# Patient Record
Sex: Male | Born: 1960 | Race: Black or African American | Hispanic: No | Marital: Single | Smoking: Never smoker
Health system: Southern US, Community
[De-identification: ages and names within clinical notes are randomized; demographics above are authoritative.]

## PROBLEM LIST (undated history)

## (undated) DIAGNOSIS — I509 Heart failure, unspecified: Secondary | ICD-10-CM

## (undated) DIAGNOSIS — G8250 Quadriplegia, unspecified: Secondary | ICD-10-CM

## (undated) DIAGNOSIS — R1310 Dysphagia, unspecified: Secondary | ICD-10-CM

## (undated) DIAGNOSIS — I4891 Unspecified atrial fibrillation: Secondary | ICD-10-CM

## (undated) DIAGNOSIS — R569 Unspecified convulsions: Secondary | ICD-10-CM

## (undated) DIAGNOSIS — K219 Gastro-esophageal reflux disease without esophagitis: Secondary | ICD-10-CM

## (undated) DIAGNOSIS — E119 Type 2 diabetes mellitus without complications: Secondary | ICD-10-CM

## (undated) DIAGNOSIS — I5030 Unspecified diastolic (congestive) heart failure: Secondary | ICD-10-CM

## (undated) DIAGNOSIS — J9610 Chronic respiratory failure, unspecified whether with hypoxia or hypercapnia: Secondary | ICD-10-CM

---

## 2016-05-29 ENCOUNTER — Emergency Department (HOSPITAL_COMMUNITY): Payer: Medicare Other

## 2016-05-29 ENCOUNTER — Inpatient Hospital Stay (HOSPITAL_COMMUNITY): Payer: Medicare Other

## 2016-05-29 ENCOUNTER — Inpatient Hospital Stay (HOSPITAL_COMMUNITY)
Admission: EM | Admit: 2016-05-29 | Discharge: 2016-06-18 | DRG: 871 | Disposition: A | Discharge status: Skilled Nursing Facility | Payer: Medicare Other | Attending: Internal Medicine | Admitting: Internal Medicine

## 2016-05-29 ENCOUNTER — Encounter (HOSPITAL_COMMUNITY): Payer: Self-pay

## 2016-05-29 DIAGNOSIS — R627 Adult failure to thrive: Secondary | ICD-10-CM | POA: Diagnosis present

## 2016-05-29 DIAGNOSIS — Z87820 Personal history of traumatic brain injury: Secondary | ICD-10-CM

## 2016-05-29 DIAGNOSIS — L89622 Pressure ulcer of left heel, stage 2: Secondary | ICD-10-CM | POA: Diagnosis present

## 2016-05-29 DIAGNOSIS — F0781 Postconcussional syndrome: Secondary | ICD-10-CM | POA: Diagnosis not present

## 2016-05-29 DIAGNOSIS — E870 Hyperosmolality and hypernatremia: Secondary | ICD-10-CM | POA: Diagnosis not present

## 2016-05-29 DIAGNOSIS — R042 Hemoptysis: Secondary | ICD-10-CM

## 2016-05-29 DIAGNOSIS — Z515 Encounter for palliative care: Secondary | ICD-10-CM | POA: Diagnosis not present

## 2016-05-29 DIAGNOSIS — G9340 Encephalopathy, unspecified: Secondary | ICD-10-CM

## 2016-05-29 DIAGNOSIS — I5032 Chronic diastolic (congestive) heart failure: Secondary | ICD-10-CM | POA: Diagnosis not present

## 2016-05-29 DIAGNOSIS — L8990 Pressure ulcer of unspecified site, unspecified stage: Secondary | ICD-10-CM | POA: Diagnosis present

## 2016-05-29 DIAGNOSIS — E119 Type 2 diabetes mellitus without complications: Secondary | ICD-10-CM | POA: Diagnosis not present

## 2016-05-29 DIAGNOSIS — L89213 Pressure ulcer of right hip, stage 3: Secondary | ICD-10-CM | POA: Diagnosis not present

## 2016-05-29 DIAGNOSIS — L89159 Pressure ulcer of sacral region, unspecified stage: Secondary | ICD-10-CM

## 2016-05-29 DIAGNOSIS — E8809 Other disorders of plasma-protein metabolism, not elsewhere classified: Secondary | ICD-10-CM

## 2016-05-29 DIAGNOSIS — Z9911 Dependence on respirator [ventilator] status: Secondary | ICD-10-CM | POA: Diagnosis not present

## 2016-05-29 DIAGNOSIS — Z881 Allergy status to other antibiotic agents status: Secondary | ICD-10-CM

## 2016-05-29 DIAGNOSIS — J690 Pneumonitis due to inhalation of food and vomit: Secondary | ICD-10-CM | POA: Diagnosis present

## 2016-05-29 DIAGNOSIS — R58 Hemorrhage, not elsewhere classified: Secondary | ICD-10-CM

## 2016-05-29 DIAGNOSIS — R058 Other specified cough: Secondary | ICD-10-CM

## 2016-05-29 DIAGNOSIS — Y95 Nosocomial condition: Secondary | ICD-10-CM | POA: Diagnosis present

## 2016-05-29 DIAGNOSIS — Z5189 Encounter for other specified aftercare: Secondary | ICD-10-CM

## 2016-05-29 DIAGNOSIS — L8994 Pressure ulcer of unspecified site, stage 4: Secondary | ICD-10-CM | POA: Diagnosis present

## 2016-05-29 DIAGNOSIS — N390 Urinary tract infection, site not specified: Secondary | ICD-10-CM

## 2016-05-29 DIAGNOSIS — L89223 Pressure ulcer of left hip, stage 3: Secondary | ICD-10-CM | POA: Diagnosis not present

## 2016-05-29 DIAGNOSIS — B965 Pseudomonas (aeruginosa) (mallei) (pseudomallei) as the cause of diseases classified elsewhere: Secondary | ICD-10-CM | POA: Diagnosis present

## 2016-05-29 DIAGNOSIS — E875 Hyperkalemia: Secondary | ICD-10-CM | POA: Diagnosis present

## 2016-05-29 DIAGNOSIS — Z66 Do not resuscitate: Secondary | ICD-10-CM | POA: Diagnosis present

## 2016-05-29 DIAGNOSIS — R6521 Severe sepsis with septic shock: Secondary | ICD-10-CM | POA: Diagnosis not present

## 2016-05-29 DIAGNOSIS — R509 Fever, unspecified: Secondary | ICD-10-CM | POA: Diagnosis present

## 2016-05-29 DIAGNOSIS — Z6823 Body mass index (BMI) 23.0-23.9, adult: Secondary | ICD-10-CM

## 2016-05-29 DIAGNOSIS — I248 Other forms of acute ischemic heart disease: Secondary | ICD-10-CM | POA: Diagnosis not present

## 2016-05-29 DIAGNOSIS — R05 Cough: Secondary | ICD-10-CM

## 2016-05-29 DIAGNOSIS — G8250 Quadriplegia, unspecified: Secondary | ICD-10-CM | POA: Diagnosis not present

## 2016-05-29 DIAGNOSIS — R1310 Dysphagia, unspecified: Secondary | ICD-10-CM

## 2016-05-29 DIAGNOSIS — I4891 Unspecified atrial fibrillation: Secondary | ICD-10-CM | POA: Diagnosis present

## 2016-05-29 DIAGNOSIS — A419 Sepsis, unspecified organism: Principal | ICD-10-CM

## 2016-05-29 DIAGNOSIS — Y848 Other medical procedures as the cause of abnormal reaction of the patient, or of later complication, without mention of misadventure at the time of the procedure: Secondary | ICD-10-CM | POA: Diagnosis present

## 2016-05-29 DIAGNOSIS — D62 Acute posthemorrhagic anemia: Secondary | ICD-10-CM | POA: Diagnosis not present

## 2016-05-29 DIAGNOSIS — R0603 Acute respiratory distress: Secondary | ICD-10-CM

## 2016-05-29 DIAGNOSIS — K219 Gastro-esophageal reflux disease without esophagitis: Secondary | ICD-10-CM | POA: Diagnosis present

## 2016-05-29 DIAGNOSIS — L89154 Pressure ulcer of sacral region, stage 4: Secondary | ICD-10-CM | POA: Diagnosis present

## 2016-05-29 DIAGNOSIS — R609 Edema, unspecified: Secondary | ICD-10-CM

## 2016-05-29 DIAGNOSIS — E876 Hypokalemia: Secondary | ICD-10-CM

## 2016-05-29 DIAGNOSIS — D638 Anemia in other chronic diseases classified elsewhere: Secondary | ICD-10-CM | POA: Diagnosis present

## 2016-05-29 DIAGNOSIS — Z8619 Personal history of other infectious and parasitic diseases: Secondary | ICD-10-CM

## 2016-05-29 DIAGNOSIS — J95851 Ventilator associated pneumonia: Secondary | ICD-10-CM | POA: Diagnosis not present

## 2016-05-29 DIAGNOSIS — J189 Pneumonia, unspecified organism: Secondary | ICD-10-CM | POA: Diagnosis not present

## 2016-05-29 DIAGNOSIS — N319 Neuromuscular dysfunction of bladder, unspecified: Secondary | ICD-10-CM | POA: Diagnosis present

## 2016-05-29 DIAGNOSIS — E872 Acidosis: Secondary | ICD-10-CM | POA: Diagnosis present

## 2016-05-29 DIAGNOSIS — Z430 Encounter for attention to tracheostomy: Secondary | ICD-10-CM

## 2016-05-29 DIAGNOSIS — G40909 Epilepsy, unspecified, not intractable, without status epilepticus: Secondary | ICD-10-CM | POA: Diagnosis present

## 2016-05-29 DIAGNOSIS — E43 Unspecified severe protein-calorie malnutrition: Secondary | ICD-10-CM

## 2016-05-29 DIAGNOSIS — Z1613 Resistance to carbapenem: Secondary | ICD-10-CM

## 2016-05-29 DIAGNOSIS — L89612 Pressure ulcer of right heel, stage 2: Secondary | ICD-10-CM | POA: Diagnosis present

## 2016-05-29 DIAGNOSIS — R093 Abnormal sputum: Secondary | ICD-10-CM

## 2016-05-29 DIAGNOSIS — J9621 Acute and chronic respiratory failure with hypoxia: Secondary | ICD-10-CM

## 2016-05-29 DIAGNOSIS — Z931 Gastrostomy status: Secondary | ICD-10-CM

## 2016-05-29 DIAGNOSIS — I482 Chronic atrial fibrillation: Secondary | ICD-10-CM | POA: Diagnosis present

## 2016-05-29 DIAGNOSIS — Z452 Encounter for adjustment and management of vascular access device: Secondary | ICD-10-CM

## 2016-05-29 DIAGNOSIS — J9690 Respiratory failure, unspecified, unspecified whether with hypoxia or hypercapnia: Secondary | ICD-10-CM | POA: Diagnosis present

## 2016-05-29 DIAGNOSIS — R601 Generalized edema: Secondary | ICD-10-CM

## 2016-05-29 DIAGNOSIS — Z7189 Other specified counseling: Secondary | ICD-10-CM

## 2016-05-29 DIAGNOSIS — B964 Proteus (mirabilis) (morganii) as the cause of diseases classified elsewhere: Secondary | ICD-10-CM | POA: Diagnosis present

## 2016-05-29 DIAGNOSIS — Z1619 Resistance to other specified beta lactam antibiotics: Secondary | ICD-10-CM

## 2016-05-29 DIAGNOSIS — K9422 Gastrostomy infection: Secondary | ICD-10-CM

## 2016-05-29 DIAGNOSIS — Z7401 Bed confinement status: Secondary | ICD-10-CM

## 2016-05-29 DIAGNOSIS — A498 Other bacterial infections of unspecified site: Secondary | ICD-10-CM

## 2016-05-29 DIAGNOSIS — D6959 Other secondary thrombocytopenia: Secondary | ICD-10-CM | POA: Diagnosis not present

## 2016-05-29 HISTORY — DX: Dysphagia, unspecified: R13.10

## 2016-05-29 HISTORY — DX: Unspecified atrial fibrillation: I48.91

## 2016-05-29 HISTORY — DX: Quadriplegia, unspecified: G82.50

## 2016-05-29 HISTORY — DX: Type 2 diabetes mellitus without complications: E11.9

## 2016-05-29 HISTORY — DX: Unspecified diastolic (congestive) heart failure: I50.30

## 2016-05-29 HISTORY — DX: Gastro-esophageal reflux disease without esophagitis: K21.9

## 2016-05-29 HISTORY — DX: Chronic respiratory failure, unspecified whether with hypoxia or hypercapnia: J96.10

## 2016-05-29 LAB — CBC WITH DIFFERENTIAL/PLATELET
BASOS PCT: 0 %
Basophils Absolute: 0 10*3/uL (ref 0.0–0.1)
EOS ABS: 0 10*3/uL (ref 0.0–0.7)
EOS PCT: 0 %
HCT: 28.5 % — ABNORMAL LOW (ref 39.0–52.0)
Hemoglobin: 8.8 g/dL — ABNORMAL LOW (ref 13.0–17.0)
LYMPHS ABS: 1.2 10*3/uL (ref 0.7–4.0)
Lymphocytes Relative: 5 %
MCH: 31.3 pg (ref 26.0–34.0)
MCHC: 30.9 g/dL (ref 30.0–36.0)
MCV: 101.4 fL — ABNORMAL HIGH (ref 78.0–100.0)
MONO ABS: 2.1 10*3/uL — AB (ref 0.1–1.0)
MONOS PCT: 9 %
Neutro Abs: 19.8 10*3/uL — ABNORMAL HIGH (ref 1.7–7.7)
Neutrophils Relative %: 86 %
PLATELETS: 277 10*3/uL (ref 150–400)
RBC: 2.81 MIL/uL — AB (ref 4.22–5.81)
RDW: 17.5 % — AB (ref 11.5–15.5)
WBC: 23.1 10*3/uL — AB (ref 4.0–10.5)

## 2016-05-29 LAB — COMPREHENSIVE METABOLIC PANEL
ALBUMIN: 1.4 g/dL — AB (ref 3.5–5.0)
ALT: 62 U/L (ref 17–63)
AST: 75 U/L — AB (ref 15–41)
Alkaline Phosphatase: 214 U/L — ABNORMAL HIGH (ref 38–126)
Anion gap: 8 (ref 5–15)
BUN: 43 mg/dL — AB (ref 6–20)
CHLORIDE: 99 mmol/L — AB (ref 101–111)
CO2: 29 mmol/L (ref 22–32)
Calcium: 7.8 mg/dL — ABNORMAL LOW (ref 8.9–10.3)
Creatinine, Ser: 0.78 mg/dL (ref 0.61–1.24)
GFR calc Af Amer: >60 mL/min (ref >60)
GFR calc non Af Amer: >60 mL/min (ref >60)
GLUCOSE: 143 mg/dL — AB (ref 65–99)
POTASSIUM: 5.3 mmol/L — AB (ref 3.5–5.1)
SODIUM: 136 mmol/L (ref 135–145)
Total Bilirubin: 0.4 mg/dL (ref 0.3–1.2)
Total Protein: 5.8 g/dL — ABNORMAL LOW (ref 6.5–8.1)

## 2016-05-29 LAB — URINALYSIS, ROUTINE W REFLEX MICROSCOPIC
BILIRUBIN URINE: NEGATIVE
GLUCOSE, UA: NEGATIVE mg/dL
HGB URINE DIPSTICK: NEGATIVE
KETONES UR: NEGATIVE mg/dL
Nitrite: NEGATIVE
Protein, ur: NEGATIVE mg/dL
Specific Gravity, Urine: 1.022 (ref 1.005–1.030)
pH: 7 (ref 5.0–8.0)

## 2016-05-29 LAB — URINE MICROSCOPIC-ADD ON: SQUAMOUS EPITHELIAL / LPF: NONE SEEN

## 2016-05-29 LAB — I-STAT CG4 LACTIC ACID, ED
LACTIC ACID, VENOUS: 3.91 mmol/L — AB (ref 0.5–1.9)
Lactic Acid, Venous: 2.72 mmol/L (ref 0.5–1.9)

## 2016-05-29 LAB — CORTISOL: CORTISOL PLASMA: 16.1 ug/dL

## 2016-05-29 LAB — PROCALCITONIN: Procalcitonin: 10.07 ng/mL

## 2016-05-29 LAB — TROPONIN I
TROPONIN I: 0.07 ng/mL — AB (ref <0.03)
Troponin I: 0.12 ng/mL (ref <0.03)

## 2016-05-29 LAB — APTT: APTT: 37 s — AB (ref 24–36)

## 2016-05-29 LAB — PROTIME-INR
INR: 1.84
Prothrombin Time: 21.5 seconds — ABNORMAL HIGH (ref 11.4–15.2)

## 2016-05-29 LAB — MAGNESIUM: MAGNESIUM: 1.9 mg/dL (ref 1.7–2.4)

## 2016-05-29 LAB — ABO/RH: ABO/RH(D): A POS

## 2016-05-29 LAB — CBG MONITORING, ED: GLUCOSE-CAPILLARY: 138 mg/dL — AB (ref 65–99)

## 2016-05-29 MED ORDER — HYDROCORTISONE NA SUCCINATE PF 100 MG IJ SOLR
50.0000 mg | Freq: Four times a day (QID) | INTRAMUSCULAR | Status: DC
  Administered 2016-05-29 – 2016-05-31 (×8): 50 mg via INTRAVENOUS
  Filled 2016-05-29 (×8): qty 2

## 2016-05-29 MED ORDER — DEXTROSE 5 % IV SOLN
1.0000 g | Freq: Three times a day (TID) | INTRAVENOUS | Status: DC
  Filled 2016-05-29 (×2): qty 1

## 2016-05-29 MED ORDER — SODIUM CHLORIDE 0.9 % IV SOLN
INTRAVENOUS | Status: DC
  Administered 2016-05-30: 75 mL/h via INTRAVENOUS
  Administered 2016-06-06: 250 mL via INTRAVENOUS
  Administered 2016-06-08 – 2016-06-13 (×2): via INTRAVENOUS

## 2016-05-29 MED ORDER — LINEZOLID 600 MG/300ML IV SOLN
600.0000 mg | Freq: Two times a day (BID) | INTRAVENOUS | Status: DC
  Administered 2016-05-29 – 2016-06-04 (×13): 600 mg via INTRAVENOUS
  Filled 2016-05-29 (×17): qty 300

## 2016-05-29 MED ORDER — ASPIRIN 300 MG RE SUPP
150.0000 mg | Freq: Every day | RECTAL | Status: DC
  Administered 2016-05-29 – 2016-05-30 (×2): 150 mg via RECTAL
  Filled 2016-05-29 (×3): qty 1

## 2016-05-29 MED ORDER — ORAL CARE MOUTH RINSE
15.0000 mL | Freq: Four times a day (QID) | OROMUCOSAL | Status: DC
  Administered 2016-05-30 – 2016-06-18 (×74): 15 mL via OROMUCOSAL

## 2016-05-29 MED ORDER — SODIUM CHLORIDE 0.9 % IV SOLN
500.0000 mg | Freq: Four times a day (QID) | INTRAVENOUS | Status: DC
  Administered 2016-05-29: 500 mg via INTRAVENOUS
  Filled 2016-05-29 (×3): qty 500

## 2016-05-29 MED ORDER — SODIUM CHLORIDE 0.9 % IV BOLUS (SEPSIS)
1000.0000 mL | Freq: Once | INTRAVENOUS | Status: AC
  Administered 2016-05-29: 1000 mL via INTRAVENOUS

## 2016-05-29 MED ORDER — NOREPINEPHRINE BITARTRATE 1 MG/ML IV SOLN
5.0000 ug/min | INTRAVENOUS | Status: DC
  Filled 2016-05-29: qty 4

## 2016-05-29 MED ORDER — SODIUM CHLORIDE 0.9 % IV BOLUS (SEPSIS)
1000.0000 mL | Freq: Once | INTRAVENOUS | Status: AC
Start: 2016-05-29 — End: 2016-05-29
  Administered 2016-05-29: 1000 mL via INTRAVENOUS

## 2016-05-29 MED ORDER — HEPARIN SODIUM (PORCINE) 5000 UNIT/ML IJ SOLN
5000.0000 [IU] | Freq: Three times a day (TID) | INTRAMUSCULAR | Status: DC
  Administered 2016-05-29 – 2016-06-12 (×39): 5000 [IU] via SUBCUTANEOUS
  Filled 2016-05-29 (×39): qty 1

## 2016-05-29 MED ORDER — DEXTROSE 5 % IV SOLN
2.0000 g | Freq: Once | INTRAVENOUS | Status: AC
  Administered 2016-05-29: 2 g via INTRAVENOUS
  Filled 2016-05-29: qty 2

## 2016-05-29 MED ORDER — SODIUM CHLORIDE 0.9 % IV SOLN: 500.0000 mL | INTRAVENOUS | Status: DC | PRN

## 2016-05-29 MED ORDER — LEVOFLOXACIN IN D5W 750 MG/150ML IV SOLN: 750.0000 mg | INTRAVENOUS | Status: DC

## 2016-05-29 MED ORDER — SODIUM CHLORIDE 0.9 % IV SOLN
INTRAVENOUS | Status: DC
  Administered 2016-05-29: 14:00:00 via INTRAVENOUS
  Administered 2016-05-30 – 2016-05-31 (×2): 75 mL/h via INTRAVENOUS

## 2016-05-29 MED ORDER — INSULIN ASPART 100 UNIT/ML ~~LOC~~ SOLN
0.0000 [IU] | SUBCUTANEOUS | Status: DC
  Administered 2016-05-31 (×2): 2 [IU] via SUBCUTANEOUS
  Administered 2016-06-01: 1 [IU] via SUBCUTANEOUS
  Administered 2016-06-01: 2 [IU] via SUBCUTANEOUS
  Administered 2016-06-01: 1 [IU] via SUBCUTANEOUS
  Administered 2016-06-01: 2 [IU] via SUBCUTANEOUS
  Administered 2016-06-02 – 2016-06-12 (×10): 1 [IU] via SUBCUTANEOUS

## 2016-05-29 MED ORDER — LEVOFLOXACIN IN D5W 750 MG/150ML IV SOLN
750.0000 mg | Freq: Once | INTRAVENOUS | Status: AC
Start: 2016-05-29 — End: 2016-05-29
  Administered 2016-05-29: 750 mg via INTRAVENOUS
  Filled 2016-05-29: qty 150

## 2016-05-29 MED ORDER — SODIUM CHLORIDE 0.9 % IV SOLN
500.0000 mg | Freq: Three times a day (TID) | INTRAVENOUS | Status: DC
  Administered 2016-05-30 (×2): 500 mg via INTRAVENOUS
  Filled 2016-05-29 (×4): qty 500

## 2016-05-29 MED ORDER — CHLORHEXIDINE GLUCONATE 0.12% ORAL RINSE (MEDLINE KIT)
15.0000 mL | Freq: Two times a day (BID) | OROMUCOSAL | Status: DC
  Administered 2016-05-30 – 2016-06-18 (×40): 15 mL via OROMUCOSAL

## 2016-05-29 MED ORDER — FAMOTIDINE IN NACL 20-0.9 MG/50ML-% IV SOLN
20.0000 mg | Freq: Two times a day (BID) | INTRAVENOUS | Status: DC
  Administered 2016-05-29 – 2016-06-01 (×7): 20 mg via INTRAVENOUS
  Filled 2016-05-29 (×8): qty 50

## 2016-05-29 MED ORDER — SODIUM CHLORIDE 0.9 % IV BOLUS (SEPSIS)
250.0000 mL | Freq: Once | INTRAVENOUS | Status: AC
  Administered 2016-05-29: 250 mL via INTRAVENOUS

## 2016-05-29 MED ORDER — SODIUM CHLORIDE 0.9% FLUSH
10.0000 mL | INTRAVENOUS | Status: DC | PRN
  Administered 2016-05-30 – 2016-06-15 (×10): 10 mL
  Filled 2016-05-29 (×10): qty 40

## 2016-05-29 NOTE — ED Notes (Signed)
Pt family members requesting to speak w/ Renae Fickle, NP regarding unsuccessful attempt at placing central line. Contacted e-link MD who will notify Renae Fickle, NP.

## 2016-05-29 NOTE — Procedures (Signed)
Central Venous Catheter Insertion Procedure Note Dean Graham [id number] [birth year]  Procedure: Insertion of Central Venous Catheter Indications: Drug and/or fluid administration  Procedure Details Consent: Risks of procedure as well as the alternatives and risks of each were explained to the (patient/caregiver).  Consent for procedure obtained. Time Out: Verified patient identification, verified procedure, site/side was marked, verified correct patient position, special equipment/implants available, medications/allergies/relevent history reviewed, required imaging and test results available.  Performed  Maximum sterile technique was used including antiseptics, cap, gloves, gown, hand hygiene, mask and sheet. Skin prep: Chlorhexidine; local anesthetic administered Left IJ vein accessed under ultrasound guidance. Guidewire placed, however, unable to advance guidewire further than 5-6 cm before meeting significant resistance. Attempted to reposition and manipulate wire without success. Able to visualize guidwire in vessel, however, it met some obstruction. Procedure aborted. CXR to follow.   Georgann Housekeeper, AGACNP-BC [place] [facility] Pager [phone] or [phone]  05/29/2016 6:08 PM   Required Completed most of the procedure, wire etc  Lavon Paganini. Titus Mould, MD, [place] Pgr: [facility]

## 2016-05-29 NOTE — ED Notes (Signed)
Pt suctioned via tracheostomy at this time.  Pt noted to be coughing profusely at this time.

## 2016-05-29 NOTE — ED Notes (Signed)
IV team at bedside 

## 2016-05-29 NOTE — ED Notes (Signed)
Paged [facility] to [employer], Charity fundraiser

## 2016-05-29 NOTE — ED Notes (Signed)
Family at bedside, updated on pt status. Pt now more alert, opening eyes and nodding yes or no when asked questions by family.

## 2016-05-29 NOTE — Progress Notes (Addendum)
Pharmacy Antibiotic Note  Dean Graham is a 55 y.o. male admitted on 9/19/2017with AMS and sepsis. Pharmacy has been consulted for imipenem - cilastatin dosing. Temp 100.7, WBC 23.1, lactate 2.72, and PCT 10.07.   Plan: Impenem-cilastatin 500 mg IV q8hr Linezolid 600 mg q12hr - physician dosed  Physician D/C'd aztreonam and levaquin Monitor renal function, clinical picture, and culture results.   Height: 5\' 10"  (177.8 cm) Weight: 100 lb (45.4 kg) IBW/kg (Calculated) : 73  Temp (24hrs), Avg:100.7 F (38.2 C), Min:100.7 F (38.2 C), Max:100.7 F (38.2 C)   Recent Labs Lab 05/29/16 1051 05/29/16 1349 05/29/16 1643  WBC  --   --  23.1*  CREATININE  --   --  0.78  LATICACIDVEN 3.91* 2.72*  --     Estimated Creatinine Clearance: 65.4 mL/min (by C-G formula based on SCr of 0.78 mg/dL).    Allergies  Allergen Reactions  . Ativan [Lorazepam]   . Azithromycin   . Ceftriaxone   . Vancomycin     Antimicrobials this admission: 9/19 Imipenem-cilastatin >>  9/19 Linezolid >>  9/19 Aztreonam >> 9/19 9/19 Levaquin >> 9/19  Dose adjustments this admission: N/A   Microbiology results: pending   Thank you for allowing pharmacy to be a part of this patient's care.  York Cerise, PharmD Pharmacy Resident  Pager [phone] 05/29/16 7:01 PM

## 2016-05-29 NOTE — Progress Notes (Signed)
Pharmacy Antibiotic Note  Dean Graham is a 55 y.o. male admitted on 05/29/2016 with sepsis.  Pharmacy has been consulted for levaquin and aztreonam dosing. Tmax 100.7, WBC is elevated at 23.1 and SCr is 0.78. Pt is a quad so renal function is difficult to estimate.   Plan: - Levaquin 750mg  IV Q24H - Aztreonam 2gm IV x 1 then 1gm IV Q8H - F/u renal fxn, C&S, clinical status   Height: 5\' 10"  (177.8 cm) Weight: 100 lb (45.4 kg) IBW/kg (Calculated) : 73  Temp (24hrs), Avg:100.7 F (38.2 C), Min:100.7 F (38.2 C), Max:100.7 F (38.2 C)  No results for input(s): WBC, CREATININE, LATICACIDVEN, VANCOTROUGH, VANCOPEAK, VANCORANDOM, GENTTROUGH, GENTPEAK, GENTRANDOM, TOBRATROUGH, TOBRAPEAK, TOBRARND, AMIKACINPEAK, AMIKACINTROU, AMIKACIN in the last 168 hours.  CrCl cannot be calculated (No order found.).    Allergies  Allergen Reactions  . Ativan [Lorazepam]   . Azithromycin   . Ceftriaxone   . Vancomycin     Antimicrobials this admission: Aztreo 9/19>> Levaquin 9/19>>  Dose adjustments this admission: N/A  Microbiology results: Pending  Thank you for allowing pharmacy to be a part of this patient's care.  Orton Capell, Drake Leach 05/29/2016 12:12 PM

## 2016-05-29 NOTE — ED Triage Notes (Signed)
Per [facility] - pt from [facility]. Quadriplegic w/ trach. Called out for altered mental status and hypotension. Baseline communication for pt is mouthing words.

## 2016-05-29 NOTE — ED Notes (Signed)
Report called to [place], RN at this time.  Receiving nurse denies having any further questions at this time.

## 2016-05-29 NOTE — Progress Notes (Signed)
eLink Physician-Brief Progress Note Patient Name: Dean Graham DOB: 1961/08/28 MRN: [id number]   Date of Service  05/29/2016  HPI/Events of Note  Troponin #1 = 0.12. EKG reveals non specific ST-T changes and 2. Will need CVL.  eICU Interventions  Will order: 1. ASA 150 mg PR now.  2. Continue to cycle troponin. 3. Will inform ground team of need for CVL.     Intervention Category Intermediate Interventions: Diagnostic test evaluation  Lenell Antu 05/29/2016, 3:40 PM

## 2016-05-29 NOTE — ED Notes (Signed)
CBG 138

## 2016-05-29 NOTE — Progress Notes (Addendum)
Patient from [facility], confirmed with [facility] in admissions. Patient w hx quadriplegia, trach, vent dependent. Dispo back to [facility] once medically stable and cleared for discharge.          Lance Muss, LCSW [facility] ED/41M Clinical Social Worker [phone]

## 2016-05-29 NOTE — ED Notes (Signed)
Notified by IV team at this time that they will not be able to place a PICC line on pt tonight.  Per IV team pt is too hypotensive to have a PICC line placed.  Will notify Md.

## 2016-05-29 NOTE — H&P (Signed)
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 He has ventilator depended after his initial TBI for few years then he did not require vent support until recently 2-3 years ago when he needed to be intubated and trach depended since. He was wheelchair bound in the past but has been bed ridden for last few years. Has been in a contracted position for few years. He does talk and interact at base line but has been less interactive today per brother. In ED he was febrile to 100.7, hupotensive 72/60. Was given IVF ~4L without improvement of BP. However, his MAP is >65.   PAST MEDICAL HISTORY :  He  has a past medical history of Atrial fibrillation ([place]); Chronic respiratory failure ([place]); Diabetes mellitus without complication ([place]); Diastolic heart failure ([place]); Dysphagia; GERD (gastroesophageal reflux disease); and Quadriplegia ([place]).  PAST SURGICAL HISTORY: He  has no past surgical history on file.  Allergies  Allergen Reactions  . Ativan [Lorazepam]   . Azithromycin   . Ceftriaxone   . Vancomycin     No current facility-administered medications on file prior to encounter.    No current outpatient prescriptions on file prior to encounter.    FAMILY HISTORY:  His has no family status information on file.    SOCIAL HISTORY: He  has been fully depended for his ADL and IADL. No hx of alcohol , tobacco or drug use.   REVIEW OF SYSTEMS:   Denies any chest pain or SOB. Limited ROS due to patient's mental  status.   SUBJECTIVE:  Has trach, vent depended. Denies any chest pain or SOB per patient however he is sedated.   VITAL SIGNS: BP (!) 86/67   Pulse 110   Temp (S) 100.7 F (38.2 C) (Rectal)   Resp 20   Ht _0  (1.778 m)   Wt 100 lb (45.4 kg)   SpO2 100%   BMI 14.35 kg/m   HEMODYNAMICS:    VENTILATOR SETTINGS: Vent Mode: PRVC FiO2 (%):  [50 %] 50 % Set Rate:  [14 bmp] 14 bmp Vt Set:  [460 mL-4660 mL] 4660 mL PEEP:  [5 cmH20] 5 cmH20 Plateau Pressure:  [22 cmH20-24 cmH20] 22 cmH20  INTAKE / OUTPUT: No intake/output data recorded.  PHYSICAL EXAMINATION: General:  Has trach collar, wakes up to nod and answer some questions Neuro:  Lethargic, but wakes up to answer some questions by nodding HEENT:  PERRL,  Cardiovascular:  Tachycardic,reg rhythm  Lungs:  Coarse breathe sounds.  Abdomen:   Musculoskeletal:  All exts are contracted.  Skin:  Open ulcer on left hip 4x6 in size. No rash  LABS:  None yet.  BMET No results for input(s): NA, K, CL, CO2, BUN, CREATININE, GLUCOSE in the last 168 hours.  Electrolytes No results for input(s): CALCIUM, MG, PHOS in the last 168 hours.  CBC No results for input(s): WBC, HGB, HCT, PLT in the last 168 hours.  Coag's No results for input(s): APTT, INR in the last 168 hours.  Sepsis Markers No results for  input(s): LATICACIDVEN, PROCALCITON, O2SATVEN in the last 168 hours.  ABG No results for input(s): PHART, PCO2ART, PO2ART in the last 168 hours.  Liver Enzymes No results for input(s): AST, ALT, ALKPHOS, BILITOT, ALBUMIN in the last 168 hours.  Cardiac Enzymes No results for input(s): TROPONINI, PROBNP in the last 168 hours.  Glucose No results for input(s): GLUCAP in the last 168 hours.  Imaging Dg Chest Port 1 View  Result Date: 05/29/2016 CLINICAL DATA:  Fever. EXAM: PORTABLE CHEST 1 VIEW COMPARISON:  None. FINDINGS: Extensive hazy opacification of the bilateral chest with streaky interstitial densities.  Stippled calcific type density over the right chest which could be parenchymal or artifactual. Sacrificed or at least discontinuous shunt catheter over the right neck. Tracheostomy tube is well seated. Normal heart size. Negative mediastinal contours for rotation. Osteopenia in this patient with quadriplegia. 1 cm nodular density over the left base. IMPRESSION: 1. Bilateral pleural effusion with pneumonia or atelectasis. 2. Artifact or nonspecific calcification over the right chest. 3. 1 cm nodule over the left base, likely nipple shadow. Attention on follow-up. Electronically Signed   By: Monte Fantasia M.D.   On: 05/29/2016 10:55     STUDIES:    CULTURES: [place] 9/19 >> Ucx 9/19 >>  ANTIBIOTICS: Allergic to azithromycin, ceftx, vanc, and ativan Aztreonam 9/19 >>9/19 Linezolid 9/19 >> levaquin 9/19 >>    SIGNIFICANT EVENTS: Admitted with severe sepsis with AMS.  LINES/TUBES: PEG tube Trach  DISCUSSION: Discussed with brother at bed side and met him DNR   ASSESSMENT / PLAN:  PULMONARY A: Acute on chronic vent depended resp failure - on trach for years HCAP with ?bilateral infiltrate  P:   Wean on vent settings as able, currently on FIO2 50%, PEEP 5.  CARDIOVASCULAR A:  Chronic Afib on amio Sinus tach now hypotensive P:  Continue fluid resusc Will likely need pressors for refractory hypotension Continue amio for afib  RENAL A:   crt stable from [facility] labs, repeat pending Lactic acidosis from severe sepsis P:   Cont fluids Check BMET, trend bmet Follow lactic acid  GASTROINTESTINAL A:   Not having diarrhea, unclear why on enteric  PEG tube depended Chronic neurogenic bladder P:   Cont tube feeds Cont pepcid Cont foley  HEMATOLOGIC A:   Chronic anemia of chronic disease hgb stable at baseline ~8 leukocytosis P:  No tx needed now. tx <7.0 Cont trend wbc count with abx treatment  INFECTIOUS A:   Severe sepsis from HCAP P:   Cont abx (levaquin  + linezolid as above)  ENDOCRINE  Check cortisol   NEUROLOGIC A:   AMS from infection Chronic contracture  P: Monitor for improvement with abx therapy     RASS goal: 0 to 1.   FAMILY  - Updates: updated family at bed side.   - Inter-disciplinary family meet or Palliative Care meeting due by: 06/05/16.   Pulmonary and [place] Pager: [phone]  05/29/2016, 2:10 PM  STAFF NOTE: I, Merrie Roof, MD FACP have personally reviewed patient's available data, including medical history, events of note, physical examination and test results as part of my evaluation. I have discussed with resident/NP and other care providers such as pharmacist, RN and RRT. In addition, I personally evaluated patient and elicited key findings of: aweful circumstance at baseline, contracted severe, ronchi moderate, trach site secretions, sepsis noted r/o PNA as source, given h/o resistance would favor Imipenem / linazolid, was called a code sepsis - agree, ensure  repeat lactic - improved with volume and repeat assessment has been performed by me at 1pm today, fluid 30 cc/kg, may require pressors levophed and place line, INR noted, pcxr hazziness, may need [place] chest assessment for effusion, I udpated the bother and we discussed his unfair life since age 75, we confirmed DNR, no ACLS, he would want pressors and line if needed to support him through this, to me heroics would be medically ineffective and futile and allow him to suffer The patient is critically ill with multiple organ systems failure and requires high complexity decision making for assessment and support, frequent evaluation and titration of therapies, application of advanced monitoring technologies and extensive interpretation of multiple databases.   Critical Care Time devoted to patient care services described in this note is 40 Minutes. This time reflects time of care of this signee: Merrie Roof, MD  FACP. This critical care time does not reflect procedure time, or teaching time or supervisory time of PA/NP/Med student/Med Resident etc but could involve care discussion time. Rest per NP/medical resident whose note is outlined above and that I agree with   Lavon Paganini. Titus Mould, MD, [place] Pgr: [place] [facility] 05/29/2016 5:03 PM

## 2016-05-29 NOTE — ED Provider Notes (Addendum)
[facility]-EMERGENCY DEPT Provider Note   CSN: [id number] Arrival date & time: 05/29/16  1009     History   Chief Complaint Chief Complaint  Patient presents with  . Hypotension  . Altered Mental Status    HPI Dean Graham is a 55 y.o. male.  Patient current resident at [facility] in acute care area, was noted to have low blood pressure today.  Patient w hx quadriplegia, trach, vent dependent.  Hx dm, and afib.   Patient is currently not verbally responsive to questions - level 5 caveat.  Patient reported baseline is that he will mouth words. No reported fevers.    The history is provided by the patient and the EMS personnel. The history is limited by the condition of the patient.  Altered Mental Status      Past Medical History:  Diagnosis Date  . Atrial fibrillation (HCC)   . Chronic respiratory failure (HCC)   . Diabetes mellitus without complication (HCC)   . Diastolic heart failure (HCC)   . Dysphagia   . GERD (gastroesophageal reflux disease)   . Quadriplegia (HCC)     There are no active problems to display for this patient.   No past surgical history on file.     Home Medications    Prior to Admission medications   Not on File    Family History No family history on file.  Social History Social History  Substance Use Topics  . Smoking status: Not on file  . Smokeless tobacco: Not on file  . Alcohol use Not on file     Allergies   Ativan [lorazepam]; Azithromycin; Ceftriaxone; and Vancomycin   Review of Systems Review of Systems  Unable to perform ROS: Patient nonverbal  level 5 caveat - pt not responsive   Physical Exam Updated Vital Signs BP (!) 86/66   Pulse (!) 127   Temp (S) 100.7 F (38.2 C) (Rectal)   Resp 20   SpO2 100%   Physical Exam  Constitutional:  Thin, trach, chronically ill appearing.   HENT:  Head: Atraumatic.  Mouth/Throat: Oropharynx is clear and moist.  Eyes: Pupils are equal, round, and reactive to light.  No scleral icterus.  Neck: Neck supple. No tracheal deviation present.  No stiffness or rigidity.  Trach site w moderate drainage.   Cardiovascular: Regular rhythm, normal heart sounds and intact distal pulses.   No murmur heard. Tachycardic.   Pulmonary/Chest: Effort normal. No accessory muscle usage.  Rhonchi bil.   Abdominal: Soft. Bowel sounds are normal. He exhibits no distension. There is no tenderness.  Genitourinary:  Genitourinary Comments: No cva tenderness  Musculoskeletal: He exhibits no edema.  Open ulcer left hip, approx 4 by 6 cm, no necrotic tissue. Heel decubitus ulcers.   Neurological:  Awake. Opens eyes spontaneously and to voice. [name], does not attempt to speak, or otherwise communicate. bil contractures.   Skin: Skin is warm and dry. No rash noted. He is not diaphoretic.  Psychiatric:  Lethargic.   Nursing note and vitals reviewed.    ED Treatments / Results  Labs (all labs ordered are listed, but only abnormal results are displayed) Labs Reviewed  CULTURE, BLOOD (ROUTINE X 2)  CULTURE, BLOOD (ROUTINE X 2)  URINE CULTURE  COMPREHENSIVE METABOLIC PANEL  CBC WITH DIFFERENTIAL/PLATELET  URINALYSIS, ROUTINE W REFLEX MICROSCOPIC (NOT [facility])  I-STAT CG4 LACTIC ACID, ED    EKG  EKG Interpretation  Date/Time:  Tuesday May 29 2016 10:17:24 EDT Ventricular Rate:  124 PR  Interval:    QRS Duration: 88 QT Interval:  331 QTC Calculation: 476 R Axis:   -91 Text Interpretation:  Sinus tachycardia Left anterior fascicular block Consider left ventricular hypertrophy Non-specific ST-t changes No previous tracing Confirmed by Denton Lank  MD, Caryn Bee ([id number]) on 05/29/2016 10:20:43 AM       Radiology Dg Chest Port 1 View  Result Date: 05/29/2016 CLINICAL DATA:  Fever. EXAM: PORTABLE CHEST 1 VIEW COMPARISON:  None. FINDINGS: Extensive hazy opacification of the bilateral chest with streaky interstitial densities. Stippled calcific type density over the right chest  which could be parenchymal or artifactual. Sacrificed or at least discontinuous shunt catheter over the right neck. Tracheostomy tube is well seated. Normal heart size. Negative mediastinal contours for rotation. Osteopenia in this patient with quadriplegia. 1 cm nodular density over the left base. IMPRESSION: 1. Bilateral pleural effusion with pneumonia or atelectasis. 2. Artifact or nonspecific calcification over the right chest. 3. 1 cm nodule over the left base, likely nipple shadow. Attention on follow-up. Electronically Signed   By: Marnee Spring M.D.   On: 05/29/2016 10:55    Procedures Procedures (including critical care time)  Medications Ordered in ED Medications  sodium chloride 0.9 % bolus 1,000 mL (not administered)    And  sodium chloride 0.9 % bolus 1,000 mL (not administered)    And  sodium chloride 0.9 % bolus 250 mL (not administered)  levofloxacin (LEVAQUIN) IVPB 750 mg (not administered)  aztreonam (AZACTAM) 2 g in dextrose 5 % 50 mL IVPB (not administered)     Initial Impression / Assessment and Plan / ED Course  I have reviewed the triage vital signs and the nursing notes.  Pertinent labs & imaging results that were available during my care of the patient were reviewed by me and considered in my medical decision making (see chart for details).  Clinical Course   Iv normal saline bolus. Continuous pulse ox and monitor. Respiratory therapy consulted, trach care, cleaning, suctioning, vent.   Labs. Pcxr. Cultures.   Iv abx.   Initial report was that possibly DNR - however on review [facility] records it appears pt currently is full code.  No family currently available.   Reviewed nursing notes and prior charts for additional history.   Labs/lab interface is down, so results wont pull to chart.  On checking with lab, they report lactate level 3.9, WBC 23 with left shift.   Additional ns bolus, 30 cc/kg.  PCCM consulted as bp remains low, septic shock admission  - discussed pt with Dr Tyson Alias - they will see/admit.  [place] review ED visits Nov and Dec 2016 - pts baseline bp appears in the 110-115 range.   CRITICAL CARE  RE: septic shock, hcap, persistent hypotension Performed by: Suzi Roots Total critical care time: 40 minutes Critical care time was exclusive of separately billable procedures and treating other patients. Critical care was necessary to treat or prevent imminent or life-threatening deterioration. Critical care was time spent personally by me on the following activities: development of treatment plan with patient and/or surrogate as well as nursing, discussions with consultants, evaluation of patient's response to treatment, examination of patient, obtaining history from patient or surrogate, ordering and performing treatments and interventions, ordering and review of laboratory studies, ordering and review of radiographic studies, pulse oximetry and re-evaluation of patient's condition.  Sepsis reassessment completed.   Patients bp mildly improved 88/50.   PCCM in ED to admit.     Final Clinical Impressions(s) / ED Diagnoses  Final diagnoses:  None    New Prescriptions New Prescriptions   No medications on file         Cathren Laine, MD 05/29/16 1236

## 2016-05-29 NOTE — ED Notes (Signed)
Got shiley number #7 cuffed from OR

## 2016-05-30 DIAGNOSIS — J9601 Acute respiratory failure with hypoxia: Secondary | ICD-10-CM | POA: Diagnosis not present

## 2016-05-30 DIAGNOSIS — R509 Fever, unspecified: Secondary | ICD-10-CM | POA: Diagnosis not present

## 2016-05-30 DIAGNOSIS — A419 Sepsis, unspecified organism: Secondary | ICD-10-CM | POA: Diagnosis not present

## 2016-05-30 DIAGNOSIS — N179 Acute kidney failure, unspecified: Secondary | ICD-10-CM | POA: Diagnosis not present

## 2016-05-30 DIAGNOSIS — J189 Pneumonia, unspecified organism: Secondary | ICD-10-CM | POA: Diagnosis not present

## 2016-05-30 LAB — BASIC METABOLIC PANEL
Anion gap: 5 (ref 5–15)
BUN: 38 mg/dL — ABNORMAL HIGH (ref 6–20)
CALCIUM: 7.4 mg/dL — AB (ref 8.9–10.3)
CHLORIDE: 108 mmol/L (ref 101–111)
CO2: 25 mmol/L (ref 22–32)
CREATININE: 0.65 mg/dL (ref 0.61–1.24)
Glucose, Bld: 85 mg/dL (ref 65–99)
Potassium: 5.5 mmol/L — ABNORMAL HIGH (ref 3.5–5.1)
SODIUM: 138 mmol/L (ref 135–145)

## 2016-05-30 LAB — GLUCOSE, CAPILLARY
GLUCOSE-CAPILLARY: 88 mg/dL (ref 65–99)
GLUCOSE-CAPILLARY: 85 mg/dL (ref 65–99)
GLUCOSE-CAPILLARY: 80 mg/dL (ref 65–99)
Glucose-Capillary: 90 mg/dL (ref 65–99)
Glucose-Capillary: 77 mg/dL (ref 65–99)

## 2016-05-30 LAB — URINE CULTURE

## 2016-05-30 LAB — CBC
HCT: 22.7 % — ABNORMAL LOW (ref 39.0–52.0)
HEMOGLOBIN: 7 g/dL — AB (ref 13.0–17.0)
MCH: 31.3 pg (ref 26.0–34.0)
MCHC: 30.8 g/dL (ref 30.0–36.0)
MCV: 101.3 fL — ABNORMAL HIGH (ref 78.0–100.0)
PLATELETS: 247 10*3/uL (ref 150–400)
RBC: 2.24 MIL/uL — ABNORMAL LOW (ref 4.22–5.81)
RDW: 17.4 % — AB (ref 11.5–15.5)
WBC: 11.4 10*3/uL — ABNORMAL HIGH (ref 4.0–10.5)

## 2016-05-30 LAB — POCT I-STAT 3, ART BLOOD GAS (G3+)
ACID-BASE EXCESS: 5 mmol/L — AB (ref 0.0–2.0)
BICARBONATE: 28.8 mmol/L — AB (ref 20.0–28.0)
O2 SAT: 100 %
PCO2 ART: 42.3 mmHg (ref 32.0–48.0)
PO2 ART: 214 mmHg — AB (ref 83.0–108.0)
Patient temperature: 100.1
TCO2: 30 mmol/L (ref 0–100)
pH, Arterial: 7.445 (ref 7.350–7.450)

## 2016-05-30 LAB — PHOSPHORUS: PHOSPHORUS: 3.8 mg/dL (ref 2.5–4.6)

## 2016-05-30 LAB — TROPONIN I
TROPONIN I: 0.09 ng/mL — AB (ref <0.03)
Troponin I: 0.07 ng/mL (ref <0.03)

## 2016-05-30 LAB — MRSA PCR SCREENING: MRSA by PCR: NEGATIVE

## 2016-05-30 LAB — MAGNESIUM: MAGNESIUM: 1.9 mg/dL (ref 1.7–2.4)

## 2016-05-30 LAB — PATHOLOGIST SMEAR REVIEW

## 2016-05-30 MED ORDER — LEVETIRACETAM 500 MG PO TABS
500.0000 mg | ORAL_TABLET | Freq: Two times a day (BID) | ORAL | Status: DC
  Administered 2016-05-30: 500 mg via ORAL
  Filled 2016-05-30 (×2): qty 1

## 2016-05-30 MED ORDER — SODIUM CHLORIDE 0.9 % IV SOLN
1.0000 g | Freq: Three times a day (TID) | INTRAVENOUS | Status: DC
  Administered 2016-05-30 – 2016-06-09 (×31): 1 g via INTRAVENOUS
  Filled 2016-05-30 (×34): qty 1

## 2016-05-30 MED ORDER — AMIODARONE HCL 200 MG PO TABS
100.0000 mg | ORAL_TABLET | Freq: Every day | ORAL | Status: DC
  Administered 2016-05-30 – 2016-06-18 (×20): 100 mg via ORAL
  Filled 2016-05-30 (×20): qty 1

## 2016-05-30 MED ORDER — MIDODRINE HCL 5 MG PO TABS
10.0000 mg | ORAL_TABLET | Freq: Three times a day (TID) | ORAL | Status: DC
  Administered 2016-05-30 – 2016-06-18 (×58): 10 mg via ORAL
  Filled 2016-05-30 (×60): qty 2

## 2016-05-30 MED ORDER — LACOSAMIDE 50 MG PO TABS
100.0000 mg | ORAL_TABLET | Freq: Two times a day (BID) | ORAL | Status: DC
  Administered 2016-05-30 – 2016-06-13 (×28): 100 mg via ORAL
  Filled 2016-05-30 (×28): qty 2

## 2016-05-30 NOTE — Consult Note (Addendum)
WOC Nurse wound consult note Reason for Consult:Multiple open wounds Wound type: Stage 4 Sacrum 10cm x 8cm x 0.2cm with 100% red gran tissue, mod amt drainage green, mild odor                       Healing stage 3 right hip 2.5cm x 2 cm 100 % red granulating, no drainage                        R heel healing ulcer scar tissue 2cm x 1.5cm from previous wound which has healed                         Left hip Stage 4.5cm x 7cm x 1cm, 100% red granulating, mod amt green drainage, mild odor                         Left great toe full thickness 0.8cm x 0.8cm 100% red gran tissue, no drainage or odor                        L flank MARSI 0.5cm x 0.3cm pink and dry                        L knee full thickness 2.5cm x 2cm red and moist no drainage or odor noted                        L heel Stage II 0.3cm x 0.3cm pink and moist no drainage or odor                        Pressure Ulcer POA: Yes Measurement:see above Wound bed:see above Drainage (amount, consistency, odor) see above Periwound: intact Dressing procedure/placement/frequency: I have provided nurses with orders for aquacell for sacrum and left hip to absorb drainage and provide antimicrobial benifits and allevyn foam to the others. We will not follow, but will remain available to this patient, to nursing, and the medical and/or surgical teams.  Please re-consult if we need to assist further.    Barnett HatterMelinda Zaidy Absher, RN-C, WTA-C Wound Treatment Associate

## 2016-05-30 NOTE — Progress Notes (Signed)
Initial Nutrition Assessment  DOCUMENTATION CODES:   Underweight  INTERVENTION:   TF recommendations via PEG once indicated: Vital AF 1.2 @ 70 mL/hr to provide 2016 kcal (99.7% of estimated energy needs), 126 grams protein, and 1361 mL free water  NUTRITION DIAGNOSIS:   Inadequate oral intake related to inability to eat as evidenced by NPO status.  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 He was ventilator dependent after his initial TBI for few years then he did not require vent support until recently 2-3 years ago when he needed to be intubated and trach dependent since. He was wheelchair bound in the past but has been bed ridden for last few years. Has been in a contracted position for few years.  Pt currently on vent with plans to wean as able.  Spoke with RN who denies knowledge of previous TF regimen. Paper chart does not contain information from SNF. No family present.  Once indicated, TF recommendations for infusion via PEG of Vital AF 1.2 @ 70 mL/hr to provide 2016 kcal (99.7% of estimated energy needs), 126 grams protein, and 1361 mL free water.  MV: 8.2 L/hr No propofol. Max temperature: 100.7 degrees F  Labs reviewed and include elevated potassium (5.5), elevated BUN (38) CBGs are WNL. Medications reviewed and include Pepcid, NovoLOG, heparin injection, Levophed in dextrose 5% (250 mL) NFPE: Exam completed. Mild/moderate fat depletion, mild/moderate to severe muscle depletion, and no edema noted. Suspect depletion is related to  chronic quadriplegia.  Diet Order:     Skin: Other (stage 3 sacrum)  Last BM:  Unknown  Height:   Ht Readings from Last 1 Encounters:  05/29/16 5\' 10"  (1.778 m)    Weight:   Wt Readings from Last 1 Encounters:  05/30/16 126 lb 8.7 oz (57.4 kg)    Ideal Body Weight:  64.2 kg  BMI:  Body mass index is 18.16 kg/m.  Estimated Nutritional Needs:   Kcal:  2023  Protein:  100-115 grams  Fluid:  2.0 L  EDUCATION NEEDS:   No education needs identified at this time  Rosemarie Ax Dietetic Intern Pager Number: [phone]

## 2016-05-30 NOTE — Progress Notes (Signed)
ABG collected  

## 2016-05-30 NOTE — Progress Notes (Signed)
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   SUBJECTIVE:  No vasopressors this morning   VITAL SIGNS: BP 94/77   Pulse (!) 107   Temp 98 F (36.7 C) (Oral)   Resp 17   Ht 5\' 10"  (1.778 m)   Wt 57.4 kg (126 lb 8.7 oz)   SpO2 100%   BMI 18.16 kg/m   HEMODYNAMICS:    VENTILATOR SETTINGS: Vent Mode: PRVC FiO2 (%):  [40 %-60 %] 40 % Set Rate:  [14 bmp] 14 bmp Vt Set:  [460 mL] 460 mL PEEP:  [5 cmH20] 5 cmH20 Plateau Pressure:  [18 cmH20-25 cmH20] 21 cmH20  INTAKE / OUTPUT: I/O last 3 completed shifts: In: 5515 [I.V.:3815; IV Piggyback:1700] Out: 750 [Urine:750]  PHYSICAL EXAMINATION: General: on vent HENT: Trach in place, NCAT PULM: rhonchi bilaterally, vent supported breaths GI: PEG in place, belly soft, nontender Derm: wound hip Neuro: Awake, on vent  LABS:  None yet.  BMET  Recent Labs Lab 05/29/16 1643 05/30/16 0242  NA 136 138  K 5.3* 5.5*  CL 99* 108  CO2 29 25  BUN 43* 38*  CREATININE 0.78 0.65  GLUCOSE 143* 85    Electrolytes  Recent Labs Lab 05/29/16 1608 05/29/16 1643 05/30/16 0242  CALCIUM  --  7.8* 7.4*  MG 1.9  --  1.9  PHOS  --   --  3.8    CBC  Recent Labs Lab 05/29/16 1643 05/30/16 0242  WBC 23.1* 11.4*  HGB 8.8* 7.0*  HCT 28.5* 22.7*  PLT 277 247    Coag's  Recent Labs Lab 05/29/16 1608  APTT 37*  INR 1.84    Sepsis Markers  Recent Labs Lab 05/29/16 1051 05/29/16 1349 05/29/16 1610  LATICACIDVEN 3.91* 2.72*  --    PROCALCITON  --   --  10.07    ABG  Recent Labs Lab 05/30/16 0239  PHART 7.445  PCO2ART 42.3  PO2ART 214.0*    Liver Enzymes  Recent Labs Lab 05/29/16 1643  AST 75*  ALT 62  ALKPHOS 214*  BILITOT 0.4  ALBUMIN 1.4*    Cardiac Enzymes  Recent Labs Lab 05/29/16 2052 05/30/16 0125 05/30/16 0242  TROPONINI 0.07* 0.09* 0.07*    Glucose  Recent Labs Lab 05/29/16 1031 05/30/16 0423 05/30/16 0755 05/30/16 1120  GLUCAP 138* 90 88 85    Imaging Dg Chest Port 1 View  Result Date: 05/29/2016 CLINICAL DATA:  Unsuccessful central line attempt EXAM: PORTABLE CHEST 1 VIEW COMPARISON:  05/28/2016 FINDINGS: Tracheostomy in the mid trachea 3.5 cm above the carina. Similar hazy opacification of both lungs compatible with posterior layering effusions and bibasilar consolidation/atelectasis. Limited portable supine exam. No large pneumothorax appreciated. Stable punctate calcific densities throughout the right chest, indeterminate. Mild cardiac enlargement. No significant osseous finding. Monitor leads overlie the chest. IMPRESSION: Persistent posterior layering pleural effusions bilaterally with bibasilar consolidation/atelectasis. Stable tracheostomy No large pneumothorax by portable radiography.  Overall stable exam. Electronically Signed   By: Judie Petit.  Shick M.D.   On: 05/29/2016 19:16  STUDIES:    CULTURES: BCX 9/19 >> Ucx 9/19 >>  ANTIBIOTICS: Allergic to azithromycin, ceftx, vanc, and ativan Aztreonam 9/19 >>9/19 Linezolid 9/19 >> meropenem 9/19 >>  SIGNIFICANT EVENTS: 9/20 Admitted with severe sepsis with AMS  LINES/TUBES: PEG tube Trach  DISCUSSION: 55 y/o male with a long history of tracheostomy and PEG tube dependence who presented yesterday with septic shock in the setting of HCAP.  Overall situation quite sad and seems unethical.    ASSESSMENT / PLAN:  PULMONARY A: Acute on chronic vent dependent resp failure - on trach for years HCAP > bilateral  infiltrate P:   Continue current vent settings, no plans for weaning VAP prevention protocol  CARDIOVASCULAR A:  Chronic Afib on amio Sinus tach resolved Shock resolved P:  Continue fluid resuscitation Continue amio for afib  RENAL A:   No acute issues P:   Monitor BMET and UOP Replace electrolytes as needed  GASTROINTESTINAL A:   PEG tube dependent Chronic neurogenic bladder P:   Cont tube feeds Cont pepcid Cont foley  HEMATOLOGIC A:   Chronic anemia of chronic disease hgb worse today but no bleeding leukocytosis P:  Monitor for bleeding Transfuse if Hgb < 7gm/dL  INFECTIOUS A:   Severe sepsis from HCAP Multiple wounds throughout buttock, pelvis present on admission P:   Cont abx (meropenem + linezolid as above) Wound care consult  ENDOCRINE  Check cortisol  NEUROLOGIC A:   AMS from infection Chronic contracture   P: Monitor for improvement with abx therapy RASS goal: 0 to 1.   FAMILY  - Updates: updated family at bedside on 9/19 by Dr. Tyson AliasFeinstein  - Inter-disciplinary family meet or Palliative Care meeting due by: 06/05/16.   My cc time 35 minutes  Transfer to SDU, TRH service  Overall prognosis poor, needs goals of care discussion with family  Heber CarolinaBrent McQuaid, MD [place] P[facility]M Pager: [phone] Cell: [phone] After 3pm or if no response, call [phone]

## 2016-05-30 NOTE — Progress Notes (Signed)
Pharmacy Antibiotic Note  Dean Graham is a 55 y.o. male admitted on 9/19/2017with AMS and sepsis. Patient originally placed on primaxin and zyvox. Potential hx of seizures  Plan: D/C Primaxin Merrem 1 g q8h (less concern of seizures) Linezolid 600 mg q12hr - physician dosed   Height: 5\' 10"  (177.8 cm) Weight: 126 lb 8.7 oz (57.4 kg) IBW/kg (Calculated) : 73  Temp (24hrs), Avg:97.9 F (36.6 C), Min:97.8 F (36.6 C), Max:98 F (36.7 C)   Recent Labs Lab 05/29/16 1051 05/29/16 1349 05/29/16 1643 05/30/16 0242  WBC  --   --  23.1* 11.4*  CREATININE  --   --  0.78 0.65  LATICACIDVEN 3.91* 2.72*  --   --     Estimated Creatinine Clearance: 82.7 mL/min (by C-G formula based on SCr of 0.65 mg/dL).    Allergies  Allergen Reactions  . Ativan [Lorazepam]   . Azithromycin   . Ceftriaxone   . Vancomycin    [name], PharmD, BCPS, [facility] Clinical Pharmacist Pager [phone] 05/30/2016 1:28 PM

## 2016-05-31 DIAGNOSIS — R6521 Severe sepsis with septic shock: Secondary | ICD-10-CM | POA: Diagnosis not present

## 2016-05-31 DIAGNOSIS — J9690 Respiratory failure, unspecified, unspecified whether with hypoxia or hypercapnia: Secondary | ICD-10-CM | POA: Diagnosis present

## 2016-05-31 DIAGNOSIS — L8990 Pressure ulcer of unspecified site, unspecified stage: Secondary | ICD-10-CM | POA: Diagnosis present

## 2016-05-31 DIAGNOSIS — G8250 Quadriplegia, unspecified: Secondary | ICD-10-CM | POA: Diagnosis present

## 2016-05-31 DIAGNOSIS — I4891 Unspecified atrial fibrillation: Secondary | ICD-10-CM | POA: Diagnosis present

## 2016-05-31 DIAGNOSIS — A419 Sepsis, unspecified organism: Secondary | ICD-10-CM | POA: Diagnosis not present

## 2016-05-31 DIAGNOSIS — L8994 Pressure ulcer of unspecified site, stage 4: Secondary | ICD-10-CM | POA: Diagnosis present

## 2016-05-31 DIAGNOSIS — R509 Fever, unspecified: Secondary | ICD-10-CM | POA: Diagnosis not present

## 2016-05-31 DIAGNOSIS — E119 Type 2 diabetes mellitus without complications: Secondary | ICD-10-CM

## 2016-05-31 LAB — CBC WITH DIFFERENTIAL/PLATELET
BASOS PCT: 0 %
Basophils Absolute: 0 10*3/uL (ref 0.0–0.1)
EOS PCT: 0 %
Eosinophils Absolute: 0 10*3/uL (ref 0.0–0.7)
HEMATOCRIT: 20.6 % — AB (ref 39.0–52.0)
HEMOGLOBIN: 6.1 g/dL — AB (ref 13.0–17.0)
LYMPHS PCT: 10 %
Lymphs Abs: 1 10*3/uL (ref 0.7–4.0)
MCH: 29.9 pg (ref 26.0–34.0)
MCHC: 29.6 g/dL — ABNORMAL LOW (ref 30.0–36.0)
MCV: 101 fL — AB (ref 78.0–100.0)
Monocytes Absolute: 0.3 10*3/uL (ref 0.1–1.0)
Monocytes Relative: 3 %
NEUTROS PCT: 87 %
Neutro Abs: 8.8 10*3/uL — ABNORMAL HIGH (ref 1.7–7.7)
Platelets: 213 10*3/uL (ref 150–400)
RBC: 2.04 MIL/uL — ABNORMAL LOW (ref 4.22–5.81)
RDW: 22 % — ABNORMAL HIGH (ref 11.5–15.5)
WBC: 10.1 10*3/uL (ref 4.0–10.5)

## 2016-05-31 LAB — BASIC METABOLIC PANEL
ANION GAP: 6 (ref 5–15)
BUN: 39 mg/dL — ABNORMAL HIGH (ref 6–20)
CALCIUM: 8 mg/dL — AB (ref 8.9–10.3)
CO2: 24 mmol/L (ref 22–32)
CREATININE: 0.82 mg/dL (ref 0.61–1.24)
Chloride: 113 mmol/L — ABNORMAL HIGH (ref 101–111)
Glucose, Bld: 76 mg/dL (ref 65–99)
Potassium: 4.1 mmol/L (ref 3.5–5.1)
SODIUM: 143 mmol/L (ref 135–145)

## 2016-05-31 LAB — PREPARE RBC (CROSSMATCH)

## 2016-05-31 LAB — GLUCOSE, CAPILLARY
GLUCOSE-CAPILLARY: 84 mg/dL (ref 65–99)
GLUCOSE-CAPILLARY: 170 mg/dL — AB (ref 65–99)
Glucose-Capillary: 102 mg/dL — ABNORMAL HIGH (ref 65–99)
Glucose-Capillary: 158 mg/dL — ABNORMAL HIGH (ref 65–99)
Glucose-Capillary: 171 mg/dL — ABNORMAL HIGH (ref 65–99)
Glucose-Capillary: 77 mg/dL (ref 65–99)
Glucose-Capillary: 80 mg/dL (ref 65–99)
Glucose-Capillary: 90 mg/dL (ref 65–99)

## 2016-05-31 LAB — PHOSPHORUS: PHOSPHORUS: 4.7 mg/dL — AB (ref 2.5–4.6)

## 2016-05-31 LAB — HEMOGLOBIN AND HEMATOCRIT, BLOOD
HCT: 28.4 % — ABNORMAL LOW (ref 39.0–52.0)
Hemoglobin: 9.1 g/dL — ABNORMAL LOW (ref 13.0–17.0)

## 2016-05-31 LAB — MAGNESIUM: Magnesium: 2 mg/dL (ref 1.7–2.4)

## 2016-05-31 MED ORDER — ASPIRIN 81 MG PO CHEW
81.0000 mg | CHEWABLE_TABLET | Freq: Every day | ORAL | Status: DC
  Administered 2016-05-31 – 2016-06-12 (×13): 81 mg
  Filled 2016-05-31 (×13): qty 1

## 2016-05-31 MED ORDER — LEVETIRACETAM 100 MG/ML PO SOLN
500.0000 mg | Freq: Two times a day (BID) | ORAL | Status: DC
  Administered 2016-05-31 – 2016-06-18 (×38): 500 mg
  Filled 2016-05-31 (×38): qty 5

## 2016-05-31 MED ORDER — SODIUM CHLORIDE 0.9 % IV SOLN
Freq: Once | INTRAVENOUS | Status: AC
  Administered 2016-05-31: 10 mL/h via INTRAVENOUS

## 2016-05-31 MED ORDER — VITAL AF 1.2 CAL PO LIQD
1000.0000 mL | ORAL | Status: DC
  Administered 2016-05-31 – 2016-06-18 (×26): 1000 mL
  Filled 2016-05-31 (×13): qty 1000
  Filled 2016-05-31: qty 237
  Filled 2016-05-31 (×13): qty 1000
  Filled 2016-05-31: qty 237
  Filled 2016-05-31 (×5): qty 1000
  Filled 2016-05-31: qty 237
  Filled 2016-05-31: qty 1000

## 2016-05-31 NOTE — Progress Notes (Addendum)
PROGRESS NOTE  Dean Graham  QQI:[id number] DOB: 04/19/1961 DOA: 05/29/2016 PCP: Dean Griffes, MD   Brief Narrative: Dean Graham is a 55 y.o. male SNF resident with a history of TBI, chronic trach-dependent respiratory failure, PEG tube, DM, neurogenic bladder with chronic foley, AFib, diastolic CHF, and anemia or chronic disease who was sent from [place] 9/19 with fever, hypotension, and increased work of breathing.  In ED he was febrile to 100.7, hypotensive 72/60 without improvement after 4L IVF, and with leukocytosis (WBC 23k). CXR showed evidence of bilateral pleural effusions with pneumonia vs. atelectasis. Urine Was given IVF ~4L without improvement of BP, so was admitted to the ICU for possible need for pressors. Septic shock resolved without pressor support being needed and he was transferred to SDU 9/20.   Assessment & Plan: Principal Problem:   Septic shock ([place]) Active Problems:   HCAP (healthcare-associated pneumonia)   Pressure injury of skin   Quadriplegia (HCC)   Atrial fibrillation (HCC)   Diabetes mellitus without complication ([place])   Chronic respiratory failure ([place])  Septic shock presumed due to healthcare-associated pneumonia: Source also possible urinary with many bacteria, 6-30WBC, alkaline urine (pH 7), and triple phosphate crystals suggestive of proteus infection. Resolved shock now with sepsis, BP stable. Leukocytosis resolved. Noted allergies to azithromycin, CTX, vancomycin.  - D/C hydrocortisone, cortisol 16.1.  - Continue home midodrine - Continue linezolid (9/19 >> ) and levaquin (9/19 >>), would recommend ongoing therapy providing coverage for proteus UTI. - Recollect urine for culture; first culture with multiple spp. - Monitor blood cultures (9/19) - Continue IVF's until tube feeds are at full rate again.  - Monitor UOP, which has been low this AM likely due to hypotensive hypoperfusion though creatinine stable. - ?No documented indication for  enteric precautions, MRSA screen negative, ok to discontinue.   Acute encephalopathy: Due to shock, as above. Improving from notes on 9/19.  - DNR confirmed by family during this admission.   Poor long-term prognosis: DNR confirmed by CCM on admission.  - Continue goals of care discussions, palliative team assistance appreciated.  Chronic respiratory failure, trach-dependent: Stable. - Weaning vent as able - If worsening respiratory status, would obtain U/S to characterize pleural effusions.  Chronic AFib and chronic diastolic CHF: Currently sinus rhythm, euvolemic.  - Continue home amiodarone, ASA 94m.  - Monitor UOP. Holding lasix due to sepsis.   Dysphagia, PEG tube-dependent: Chronic, stable.  - Restart tube feeds per nutritional management.  - Continue pepcid for GERD  Neurogenic bladder: Chronic, stable. UA suggestive of urease-splitting infection/colonization (cannot endorse sxs) - Continue foley, recollect urine culture  Anemia of chronic disease: Baseline hgb ~8 per records, dipped to 6.1 following volume resuscitation with adequate response following 1u PRBCs. No evidence of bleeding.  - Monitor daily  Seizure disorder: Chronic stable - Continue keppra, vimpat.  DVT prophylaxis: Subcutaneous heparin Code Status: DNR Family Communication: No family at bedside this AM Disposition Plan: Continue management in SDU, likely to discharge back to [facility] in next 240- 48 hours.   Consultants:   CCM, Dr. FTitus Graham Palliative care team  Procedures:   Foley, trach, PEG all prior to admission  Antimicrobials: linezolid (9/19 >> )  levaquin (9/19 >>)  Subjective: Pt sleeping but rousable, turns head to voice, says "hi" as greeting. No other words.   Objective: Vitals:   05/31/16 1000 05/31/16 1100 05/31/16 1200 05/31/16 1214  BP: 114/85 108/84 109/76   Pulse: 87 90    Resp: 17 17  Temp:    97.4 F (36.3 C)  TempSrc:    Axillary  SpO2: 100% 100%      Weight:      Height:        Intake/Output Summary (Last 24 hours) at 05/31/16 1406 Last data filed at 05/31/16 1343  Gross per 24 hour  Intake          3828.75 ml  Output              545 ml  Net          3283.75 ml   Filed Weights   05/29/16 1044 05/30/16 0244 05/31/16 0400  Weight: 45.4 kg (100 lb) 57.4 kg (126 lb 8.7 oz) 62 kg (136 lb 11 oz)   Examination: General exam: Chronically ill-appearing 55 y.o. male in no distress  Respiratory system: Non-labored breathing on vent. Minimal bilateral rhochi without wheezes or crackles.  Cardiovascular system: Regular rate and rhythm. No murmur, rub, or gallop. No JVD, and trace pedal edema. Gastrointestinal system: Abdomen soft, non-tender, non-distended, with normoactive bowel sounds. PEG in left abdomen wnl. Central nervous system: Awake and responsive, severely contracted x4, only moves RUE spontaneously. Extremities: Warm, contracted. Skin: Multiple pressure ulcers, worst on left hip and sacrum. Psychiatry: Difficult to assess.   Data Reviewed: I have personally reviewed following labs and imaging studies  CBC:  Recent Labs Lab 05/29/16 1643 05/30/16 0242 05/31/16 0453 05/31/16 0930  WBC 23.1* 11.4* 10.1  --   NEUTROABS 19.8*  --  8.8*  --   HGB 8.8* 7.0* 6.1* 9.1*  HCT 28.5* 22.7* 20.6* 28.4*  MCV 101.4* 101.3* 101.0*  --   PLT 277 247 213  --    Basic Metabolic Panel:  Recent Labs Lab 05/29/16 1608 05/29/16 1643 05/30/16 0242 05/31/16 0620  NA  --  136 138 143  K  --  5.3* 5.5* 4.1  CL  --  99* 108 113*  CO2  --  _0 GLUCOSE  --  143* 85 76  BUN  --  43* 38* 39*  CREATININE  --  0.78 0.65 0.82  CALCIUM  --  7.8* 7.4* 8.0*  MG 1.9  --  1.9  --   PHOS  --   --  3.8  --    GFR: Estimated Creatinine Clearance: 87.2 mL/min (by C-G formula based on SCr of 0.82 mg/dL). Liver Function Tests:  Recent Labs Lab 05/29/16 1643  AST 75*  ALT 62  ALKPHOS 214*  BILITOT 0.4  PROT 5.8*  ALBUMIN 1.4*    No results for input(s): LIPASE, AMYLASE in the last 168 hours. No results for input(s): AMMONIA in the last 168 hours. Coagulation Profile:  Recent Labs Lab 05/29/16 1608  INR 1.84   Cardiac Enzymes:  Recent Labs Lab 05/29/16 1608 05/29/16 2052 05/30/16 0125 05/30/16 0242  TROPONINI 0.12* 0.07* 0.09* 0.07*   BNP (last 3 results) No results for input(s): PROBNP in the last 8760 hours. HbA1C: No results for input(s): HGBA1C in the last 72 hours. CBG:  Recent Labs Lab 05/30/16 2027 05/31/16 0007 05/31/16 0338 05/31/16 0809 05/31/16 1210  GLUCAP 80 77 80 84 90   Lipid Profile: No results for input(s): CHOL, HDL, LDLCALC, TRIG, CHOLHDL, LDLDIRECT in the last 72 hours. Thyroid Function Tests: No results for input(s): TSH, T4TOTAL, FREET4, T3FREE, THYROIDAB in the last 72 hours. Anemia Panel: No results for input(s): VITAMINB12, FOLATE, FERRITIN, TIBC, IRON, RETICCTPCT in the last 72 hours. Urine analysis:  Component Value Date/Time   COLORURINE YELLOW 05/29/2016 1215   APPEARANCEUR TURBID (A) 05/29/2016 1215   LABSPEC 1.022 05/29/2016 1215   PHURINE 7.0 05/29/2016 1215   GLUCOSEU NEGATIVE 05/29/2016 1215   HGBUR NEGATIVE 05/29/2016 1215   BILIRUBINUR NEGATIVE 05/29/2016 1215   KETONESUR NEGATIVE 05/29/2016 1215   PROTEINUR NEGATIVE 05/29/2016 1215   NITRITE NEGATIVE 05/29/2016 1215   LEUKOCYTESUR LARGE (A) 05/29/2016 1215   Sepsis Labs: _0 (procalcitonin:4,lacticidven:4)  ) Recent Results (from the past 240 hour(s))  Urine culture     Status: Abnormal   Collection Time: 05/29/16 12:15 PM  Result Value Ref Range Status   Specimen Description URINE, RANDOM  Final   Special Requests NONE  Final   Culture MULTIPLE SPECIES PRESENT, SUGGEST RECOLLECTION (A)  Final   Report Status 05/30/2016 FINAL  Final  MRSA PCR Screening     Status: None   Collection Time: 05/30/16  2:52 AM  Result Value Ref Range Status   MRSA by PCR NEGATIVE NEGATIVE Final     Comment:        The GeneXpert MRSA Assay (FDA approved for NASAL specimens only), is one component of a comprehensive MRSA colonization surveillance program. It is not intended to diagnose MRSA infection nor to guide or monitor treatment for MRSA infections.      Radiology Studies: Dg Chest Port 1 View  Result Date: 05/29/2016 CLINICAL DATA:  Unsuccessful central line attempt EXAM: PORTABLE CHEST 1 VIEW COMPARISON:  05/28/2016 FINDINGS: Tracheostomy in the mid trachea 3.5 cm above the carina. Similar hazy opacification of both lungs compatible with posterior layering effusions and bibasilar consolidation/atelectasis. Limited portable supine exam. No large pneumothorax appreciated. Stable punctate calcific densities throughout the right chest, indeterminate. Mild cardiac enlargement. No significant osseous finding. Monitor leads overlie the chest. IMPRESSION: Persistent posterior layering pleural effusions bilaterally with bibasilar consolidation/atelectasis. Stable tracheostomy No large pneumothorax by portable radiography.  Overall stable exam. Electronically Signed   By: Jerilynn Mages.  Shick M.D.   On: 05/29/2016 19:16    Scheduled Meds: . amiodarone  100 mg Oral Daily  . aspirin  81 mg Per Tube Daily  . chlorhexidine gluconate (MEDLINE KIT)  15 mL Mouth Rinse BID  . famotidine (PEPCID) IV  20 mg Intravenous Q12H  . heparin  5,000 Units Subcutaneous Q8H  . hydrocortisone sod succinate (SOLU-CORTEF) inj  50 mg Intravenous Q6H  . insulin aspart  0-9 Units Subcutaneous Q4H  . lacosamide  100 mg Oral BID  . levETIRAcetam  500 mg Per Tube BID  . linezolid (ZYVOX) IV  600 mg Intravenous Q12H  . mouth rinse  15 mL Mouth Rinse QID  . meropenem (MERREM) IV  1 g Intravenous Q8H  . midodrine  10 mg Oral TID WC   Continuous Infusions: . sodium chloride 75 mL/hr at 05/31/16 0600  . sodium chloride 75 mL/hr (05/31/16 1100)     LOS: 2 days   Time spent: 35 minutes.  Vance Gather, MD [facility] Pager [phone]  If 7PM-7AM, please contact night-coverage [email or web address] Password [email or web address] 05/31/2016, 2:06 PM

## 2016-05-31 NOTE — Progress Notes (Signed)
eLink Physician-Brief Progress Note Patient Name: Dean Graham DOB: [birth year] MRN: [id number]   Date of Service  05/31/2016  HPI/Events of Note  hgb 6.1  eICU Interventions  Will transfuse 1 unit PRBC     Intervention Category Evaluation Type: Other  [name] 05/31/2016, 5:55 AM

## 2016-05-31 NOTE — Progress Notes (Signed)
CSW continues to follow for return to [facility] at discharge. Palliative Care consult has been placed for goals of care. CSW continues to follow for disposition.          Lance Muss, LCSW [facility] ED/72M Clinical Social Worker [phone]

## 2016-05-31 NOTE — Progress Notes (Signed)
Spoke to the nurse caring for this patients advised that this patient would need to to go to IR to have the PICC placed due to both extrimities being contracted. Asked the the PICC order be discontinued for the PICC placement.  Consuello Masseimmons, Sully Dyment M

## 2016-05-31 NOTE — Progress Notes (Signed)
Nutrition Follow-up / Consult  DOCUMENTATION CODES:   Underweight  INTERVENTION:    Vital AF 1.2 @ 70 mL/hr to provide 2016 kcal, 126 grams protein, and 1361 mL free water  NUTRITION DIAGNOSIS:   Inadequate oral intake related to inability to eat as evidenced by NPO status.  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 He was ventilator dependent after his initial TBI for few years then he did not require vent support until recently 2-3 years ago when he needed to be intubated and trach dependent since. He was wheelchair bound in the past but has been bed ridden for last few years. Has been in a contracted position for few years.  Received MD Consult for TF initiation and management. Labs and medications reviewed.  Diet Order:   NPO  Skin:   (stage 3 sacrum)  Last BM:  9/21  Height:   Ht Readings from Last 1 Encounters:  05/29/16 5\' 10"  (1.778 m)    Weight:   Wt Readings from Last 1 Encounters:  05/31/16 136 lb 11 oz (62 kg)    Ideal Body Weight:  64.2 kg  BMI:  Body mass index is 19.61 kg/m.  Estimated Nutritional Needs:   Kcal:  2023  Protein:  100-115 grams  Fluid:  2.0 L  EDUCATION NEEDS:   No education needs identified at this time  Joaquin CourtsKimberly Monteen Toops, RD, LDN, CNSC Pager [phone] After Hours Pager [phone]

## 2016-05-31 NOTE — Care Management Note (Signed)
Case Management Note  Patient Details  Name: Dean Graham MRN: [id number] Date of Birth: 01-19-1961  Subjective/Objective:   Pt admitted with septic shock                 Action/Plan:  Pt resides at [facility].  Pt is chronic vent/trach and peg pt.  Plans are for pt to return to [facility] at discharge.  CM will continue to follow for discharge needs   Expected Discharge Date:                  Expected Discharge Plan:  Skilled Nursing Facility  In-House Referral:  Clinical Social Work  Discharge planning Services  CM Consult  Post Acute Care Choice:    Choice offered to:     DME Arranged:    DME Agency:     HH Arranged:    HH Agency:     Status of Service:  In process, will continue to follow  If discussed at Long Length of Stay Meetings, dates discussed:    Additional Comments:  Cherylann Parr, RN 05/31/2016, 10:03 AM

## 2016-05-31 NOTE — Progress Notes (Signed)
Pt received from 56M to 2C-02 via accumax bed on portable tele. Upon arrival pt HDS, on 28% TC with no s/s of resp distress. Lines, dressings, and tubing in tact. Pt opens eyes to voice and follows simple commands. Please see doc flowsheet for detailed assessment. Report received from [place], [place]. This RN will continue to monitor

## 2016-06-01 DIAGNOSIS — Z5189 Encounter for other specified aftercare: Secondary | ICD-10-CM | POA: Diagnosis not present

## 2016-06-01 DIAGNOSIS — E43 Unspecified severe protein-calorie malnutrition: Secondary | ICD-10-CM | POA: Diagnosis not present

## 2016-06-01 DIAGNOSIS — A419 Sepsis, unspecified organism: Secondary | ICD-10-CM | POA: Diagnosis not present

## 2016-06-01 DIAGNOSIS — J9611 Chronic respiratory failure with hypoxia: Secondary | ICD-10-CM

## 2016-06-01 DIAGNOSIS — I482 Chronic atrial fibrillation: Secondary | ICD-10-CM | POA: Diagnosis not present

## 2016-06-01 DIAGNOSIS — R509 Fever, unspecified: Secondary | ICD-10-CM | POA: Diagnosis not present

## 2016-06-01 DIAGNOSIS — Z7189 Other specified counseling: Secondary | ICD-10-CM

## 2016-06-01 DIAGNOSIS — Z515 Encounter for palliative care: Secondary | ICD-10-CM | POA: Diagnosis not present

## 2016-06-01 DIAGNOSIS — J189 Pneumonia, unspecified organism: Secondary | ICD-10-CM | POA: Diagnosis not present

## 2016-06-01 DIAGNOSIS — R6521 Severe sepsis with septic shock: Secondary | ICD-10-CM | POA: Diagnosis not present

## 2016-06-01 LAB — URINE CULTURE: CULTURE: NO GROWTH

## 2016-06-01 LAB — GLUCOSE, CAPILLARY
GLUCOSE-CAPILLARY: 158 mg/dL — AB (ref 65–99)
GLUCOSE-CAPILLARY: 147 mg/dL — AB (ref 65–99)
Glucose-Capillary: 109 mg/dL — ABNORMAL HIGH (ref 65–99)
Glucose-Capillary: 178 mg/dL — ABNORMAL HIGH (ref 65–99)
Glucose-Capillary: 128 mg/dL — ABNORMAL HIGH (ref 65–99)
Glucose-Capillary: 126 mg/dL — ABNORMAL HIGH (ref 65–99)

## 2016-06-01 LAB — TYPE AND SCREEN
ABO/RH(D): A POS
ANTIBODY SCREEN: NEGATIVE
UNIT DIVISION: 0

## 2016-06-01 LAB — MAGNESIUM
Magnesium: 1.7 mg/dL (ref 1.7–2.4)
Magnesium: 1.7 mg/dL (ref 1.7–2.4)

## 2016-06-01 LAB — PHOSPHORUS
Phosphorus: 3.1 mg/dL (ref 2.5–4.6)
Phosphorus: 2.6 mg/dL (ref 2.5–4.6)

## 2016-06-01 NOTE — Progress Notes (Signed)
Pt. placed back on vent for h/s, spontaneous rate was at 10, tolerated transition well, delayed some due to CODE response, RT to monitor.

## 2016-06-01 NOTE — NC FL2 (Signed)
[place] LEVEL OF CARE SCREENING TOOL     IDENTIFICATION  Patient Name: Dean Graham Birthdate: [birth year] Sex: male Admission Date (Current Location): 05/29/2016  [facility] and [place] Number:  Herbalist and Address:  [facility] [place], [place] [address] [place], [place] [address]      Provider Number: [id number]  Attending Physician Name and Address:  Robbie Lis, MD  Relative Name and Phone Number:  [name], brother, [phone]    Current Level of Care: Hospital Recommended Level of Care: Vent SNF Prior Approval Number:    Date Approved/Denied:   PASRR Number: [id number]  Discharge Plan: SNF    Current Diagnoses: Patient Active Problem List   Diagnosis Date Noted  . Pressure injury of skin 05/31/2016  . Quadriplegia ([place]) 05/31/2016  . Atrial fibrillation ([place]) 05/31/2016  . Diabetes mellitus without complication ([place]) 41/96/2229  . Chronic respiratory failure ([place]) 05/31/2016  . Pressure ulcer stage IV ([place]) 05/31/2016  . Septic shock ([place]) 05/29/2016  . HCAP (healthcare-associated pneumonia)     Orientation RESPIRATION BLADDER Height & Weight      (Intubated)  Tracheostomy, Vent (Trach FiO2 28%;) Continent, Indwelling catheter (Urethral catheter) Weight: 63 kg (138 lb 14.2 oz) Height:  '5\' 10"'$  (177.8 cm)  BEHAVIORAL SYMPTOMS/MOOD NEUROLOGICAL BOWEL NUTRITION STATUS      Incontinent (Gastrostomy) Feeding tube  AMBULATORY STATUS COMMUNICATION OF NEEDS Skin   Extensive Assist  (Intubated) PU Stage and Appropriate Care (Stage IV PU on sacrum, hip, and heel;wound on knee)                       Personal Care Assistance Level of Assistance  Bathing, Feeding, Dressing Bathing Assistance: Maximum assistance Feeding assistance: Maximum assistance Dressing Assistance: Maximum assistance     Functional Limitations Info             SPECIAL CARE FACTORS FREQUENCY  PT (By licensed PT)     PT Frequency: not  yet assessed              Contractures      Additional Factors Info  Code Status, Allergies Code Status Info: DNR Allergies Info:  Ativan Lorazepam, Azithromycin, Ceftriaxone, Vancomycin           Current Medications (06/01/2016):  This is the current hospital active medication list Current Facility-Administered Medications  Medication Dose Route Frequency Provider Last Rate Last Dose  . 0.9 %  sodium chloride infusion   Intravenous Continuous Patrecia Pour, MD 10 mL/hr at 05/31/16 2200    . amiodarone (PACERONE) tablet 100 mg  100 mg Oral Daily Juanito Doom, MD   100 mg at 06/01/16 0954  . aspirin chewable tablet 81 mg  81 mg Per Tube Daily Wynell Balloon, RPH   81 mg at 06/01/16 0954  . chlorhexidine gluconate (MEDLINE KIT) (PERIDEX) 0.12 % solution 15 mL  15 mL Mouth Rinse BID Erick Colace, NP   15 mL at 06/01/16 0816  . famotidine (PEPCID) IVPB 20 mg premix  20 mg Intravenous Q12H Erick Colace, NP 100 mL/hr at 06/01/16 0954 20 mg at 06/01/16 0954  . feeding supplement (VITAL AF 1.2 CAL) liquid 1,000 mL  1,000 mL Per Tube Continuous Patrecia Pour, MD 70 mL/hr at 06/01/16 1241 1,000 mL at 06/01/16 1241  . heparin injection 5,000 Units  5,000 Units Subcutaneous Q8H Erick Colace, NP   5,000 Units at 06/01/16 1452  .  insulin aspart (novoLOG) injection 0-9 Units  0-9 Units Subcutaneous Q4H Erick Colace, NP   2 Units at 06/01/16 1242  . lacosamide (VIMPAT) tablet 100 mg  100 mg Oral BID Juanito Doom, MD   100 mg at 06/01/16 0954  . levETIRAcetam (KEPPRA) 100 MG/ML solution 500 mg  500 mg Per Tube BID Wynell Balloon, RPH   500 mg at 06/01/16 0953  . linezolid (ZYVOX) IVPB 600 mg  600 mg Intravenous Q12H Erick Colace, NP 300 mL/hr at 06/01/16 1451 600 mg at 06/01/16 1451  . MEDLINE mouth rinse  15 mL Mouth Rinse QID Erick Colace, NP   15 mL at 06/01/16 1243  . meropenem (MERREM) 1 g in sodium chloride 0.9 % 100 mL IVPB  1 g Intravenous Q8H Juanito Doom,  MD   1 g at 06/01/16 0952  . midodrine (PROAMATINE) tablet 10 mg  10 mg Oral TID WC Juanito Doom, MD   10 mg at 06/01/16 1242  . sodium chloride flush (NS) 0.9 % injection 10-40 mL  10-40 mL Intracatheter PRN Raylene Miyamoto, MD   10 mL at 06/01/16 0551     Discharge Medications: Please see discharge summary for a list of discharge medications.  Relevant Imaging Results:  Relevant Lab Results:   Additional Information SSN: [place], [place]

## 2016-06-01 NOTE — Clinical Social Work Note (Signed)
Clinical Social Work Assessment  Patient Details  Name: Dean Graham MRN: [id number] Date of Birth: [birth year]  Date of referral:  06/01/16               Reason for consult:  Facility Placement                Permission sought to share information with:  Facility Medical sales representative, Family Supports Permission granted to share information::  No (Pt intubated)  Name::     Banker::  SNFs  Relationship::  Brother  Contact Information:  [phone]  Housing/Transportation Living arrangements for the past 2 months:  Skilled Nursing Facility Source of Information:  Other (Comment Required) (Brother) Patient Interpreter Needed:  None Criminal Activity/Legal Involvement Pertinent to Current Situation/Hospitalization:  No - Comment as needed Significant Relationships:  Parents, Siblings Lives with:  Facility Resident Do you feel safe going back to the place where you live?  Yes Need for family participation in patient care:  Yes (Comment)  Care giving concerns:  CSW received consult regarding discharge planning. Patient is from [facility] and can return there at discharge per [facility]. Palliative care team alerted CSW that patient's family would like patient to be moved somewhere closer to them. CSW explained that it would depend on bed availability at vent SNFs. CSW to continue to follow for discharge needs.    Social Worker assessment / plan:  Patient to return to [facility] if no other placement can be found.  Employment status:  Disabled (Comment on whether or not currently receiving Disability) Insurance information:  Medicare PT Recommendations:  Not assessed at this time Information / Referral to community resources:  Skilled Nursing Facility  Patient/Family's Response to care:  Patient's brother reported understanding of discharge plan.  Patient/Family's Understanding of and Emotional Response to Diagnosis, Current Treatment, and Prognosis:  Patient/family is  realistic regarding therapy needs and expressed being hopeful for vent SNF placement. Patient's brother expressed understanding of CSW role and discharge process. No questions/concerns about plan or treatment.    Emotional Assessment Appearance:  Appears older than stated age Attitude/Demeanor/Rapport:  Unable to Assess Affect (typically observed):  Unable to Assess Orientation:   (Intubated) Alcohol / Substance use:  Not Applicable Psych involvement (Current and /or in the community):  No (Comment)  Discharge Needs  Concerns to be addressed:  Care Coordination Readmission within the last 30 days:  No Current discharge risk:  None Barriers to Discharge:  Continued Medical Work up   [employer], LCSWA 06/01/2016, 3:11 PM

## 2016-06-01 NOTE — Consult Note (Signed)
WOC Nurse wound follow up Woc Consult was completed on 9/20 with note and orders written as follows:  Cleanse with NS and pat dry, Pack Sacral wound and Left hip wound with aquacel [place] # [phone]) using swab to fill into edges then cover with ABD pad and tape in place.  Foam dressings to Left flank, Left great toe, Left knee and bilateral heels, change Q 5 days and prn soiling.  We will not follow, but will remain available to this patient, to nursing, and the medical and/or surgical teams.  Please re-consult if we need to assist further.    Barnett HatterMelinda Rafan Sanders, RN-C, WTA-C Wound Treatment Associate

## 2016-06-01 NOTE — Progress Notes (Signed)
No charge note  Meeting brother [name] and mother today at 2:00 pm.  Brother asked if placement at [facility] in [facility] could be considered as his family lives in [place].   He has been at [facility] before.  [name], [place] Palliative Medicine Pager: [phone]

## 2016-06-01 NOTE — Progress Notes (Addendum)
Patient ID: Dean Graham, male   DOB: 1960/10/15, 55 y.o.   MRN: [id number]  PROGRESS NOTE    Dean Graham  WUJ:[id number] DOB: 02/23/1961 DOA: 05/29/2016  PCP: Verneita Griffes, MD   Brief Narrative:  55 y.o. male, SNF resident with a history of TBI, chronic trach-dependent respiratory failure, PEG tube, neurogenic bladder with chronic foley, AFib, diastolic CHF, anemia of chronic disease who was sent from [place] 9/19 with fever, hypotension and increased work of breathing.  In ED, pt was febrile with T max of 100.7 F, hypotensive with BP of 72/60 without improvement after 4L IV fluids. He is white blood cell count was 23. Chest x-ray showed bilateral pleural effusions with pneumonia versus atelectasis. Patient was admitted to ICU for possible need for pressor support. Patient was transferred to stepdown unit 05/30/2016.   Assessment & Plan:   Septic shock presumed due to healthcare-associated pneumonia / Leukocytosis / urinary tract infection in patient with chronic indwelling Foley catheter - Patient in septic shock at the time of the admission with the source suspected to be pneumonia as well as UTI. - Chest x-ray on admission showed bilateral pleural effusions with pneumonia versus atelectasis - Patient had some bacteria on urinalysis with alkaline pH of 7 and triple phosphate crystals suggestive of Proteus infection - Septic shock resolved at this point - Leukocytosis now resolved - He is on meropenem and Linezolid, would recommend ongoing therapy providing coverage for proteus UTI. - D/C'ed hydrocortisone, cortisol 16.1.  - Continue home midodrine  Mild troponin elevation - Due to demand ischemic from septic shock - Troponin 0.09 --> 0.07  - No further work up needed at this time   Acute encephalopathy - Due to shock - No significant changes in mental status in past 24 hours   Hyperkalemia - Repeat potassium WNL  Chronic respiratory failure, trach-dependent -  Stable respiratory status   Chronic diastolic CHF - Holding lasix due to sepsis.   Chronic atrial fibrillation - CHADS vasc score 2 - On amiodarone and aspirin   Dysphagia, PEG tube-dependent - Chronic, stable  - Continue tube feeds    Neurogenic bladder - Chronic, stable - Has Foley catheter in place  Anemia of chronic disease - Baseline hemoglobin around 8 - Status post 1 unit of PRBC transfusion - Hemoglobin 9.1 this morning  Seizure disorder - Continue keppra, vimpat - No reports of seizures    DVT prophylaxis: Heparin subcutaneous Code Status: DNR/DNI Family Communication: No family at the bedside Disposition Plan: D/C to SNF once medically stable. Will need wound care assessment prior to discharge.   Consultants:   CCM, Dr. Titus Mould  Palliative care team  Procedures:   Foley, trach, PEG all prior to admission  Antimicrobials:   Linezolid   Meropenem    Subjective: No overnight events.  Objective: Vitals:   06/01/16 0419 06/01/16 0500 06/01/16 0750 06/01/16 0800  BP: 98/69 95/79 (!) 117/95 107/80  Pulse: 78 99 92 99  Resp: 14 (!) '23 17 16  '$ Temp: 97.5 F (36.4 C)  97.8 F (36.6 C)   TempSrc: Oral  Oral   SpO2: 100% 100% 100% 100%  Weight:      Height:        Intake/Output Summary (Last 24 hours) at 06/01/16 0902 Last data filed at 06/01/16 0800  Gross per 24 hour  Intake             2985 ml  Output  212 ml  Net             2773 ml   Filed Weights   05/30/16 0244 05/31/16 0400 06/01/16 0004  Weight: 57.4 kg (126 lb 8.7 oz) 62 kg (136 lb 11 oz) 63 kg (138 lb 14.2 oz)    Examination:  General exam: Appears calm and comfortable  Respiratory system: Diminished, no wheezing; trach in place  Cardiovascular system: S1 & S2 heard, Rate controlled  Gastrointestinal system: Abdomen is nondistended, soft and nontender. No organomegaly or masses felt. Normal bowel sounds heard. PEG in place  Central nervous system: All 4  extremities contracted  Extremities: Contracted, +1 pitting edema Skin: pressure ulcer  Psychiatry: Unable to assess due to pt mental status   Data Reviewed: I have personally reviewed following labs and imaging studies  CBC:  Recent Labs Lab 05/29/16 1643 05/30/16 0242 05/31/16 0453 05/31/16 0930  WBC 23.1* 11.4* 10.1  --   NEUTROABS 19.8*  --  8.8*  --   HGB 8.8* 7.0* 6.1* 9.1*  HCT 28.5* 22.7* 20.6* 28.4*  MCV 101.4* 101.3* 101.0*  --   PLT 277 247 213  --    Basic Metabolic Panel:  Recent Labs Lab 05/29/16 1608 05/29/16 1643 05/30/16 0242 05/31/16 0620 05/31/16 1513 06/01/16 0545  NA  --  136 138 143  --   --   K  --  5.3* 5.5* 4.1  --   --   CL  --  99* 108 113*  --   --   CO2  --  '29 25 24  '$ --   --   GLUCOSE  --  143* 85 76  --   --   BUN  --  43* 38* 39*  --   --   CREATININE  --  0.78 0.65 0.82  --   --   CALCIUM  --  7.8* 7.4* 8.0*  --   --   MG 1.9  --  1.9  --  2.0 1.7  PHOS  --   --  3.8  --  4.7* 3.1   GFR: Estimated Creatinine Clearance: 88.6 mL/min (by C-G formula based on SCr of 0.82 mg/dL). Liver Function Tests:  Recent Labs Lab 05/29/16 1643  AST 75*  ALT 62  ALKPHOS 214*  BILITOT 0.4  PROT 5.8*  ALBUMIN 1.4*   No results for input(s): LIPASE, AMYLASE in the last 168 hours. No results for input(s): AMMONIA in the last 168 hours. Coagulation Profile:  Recent Labs Lab 05/29/16 1608  INR 1.84   Cardiac Enzymes:  Recent Labs Lab 05/29/16 1608 05/29/16 2052 05/30/16 0125 05/30/16 0242  TROPONINI 0.12* 0.07* 0.09* 0.07*   BNP (last 3 results) No results for input(s): PROBNP in the last 8760 hours. HbA1C: No results for input(s): HGBA1C in the last 72 hours. CBG:  Recent Labs Lab 05/31/16 1600 05/31/16 1951 05/31/16 2329 06/01/16 0415 06/01/16 0748  GLUCAP 102* 170* 171* 109* 178*   Lipid Profile: No results for input(s): CHOL, HDL, LDLCALC, TRIG, CHOLHDL, LDLDIRECT in the last 72 hours. Thyroid Function  Tests: No results for input(s): TSH, T4TOTAL, FREET4, T3FREE, THYROIDAB in the last 72 hours. Anemia Panel: No results for input(s): VITAMINB12, FOLATE, FERRITIN, TIBC, IRON, RETICCTPCT in the last 72 hours. Urine analysis:    Component Value Date/Time   COLORURINE YELLOW 05/29/2016 1215   APPEARANCEUR TURBID (A) 05/29/2016 1215   LABSPEC 1.022 05/29/2016 1215   PHURINE 7.0 05/29/2016 1215   GLUCOSEU NEGATIVE  05/29/2016 [place] 05/29/2016 1215   BILIRUBINUR NEGATIVE 05/29/2016 1215   KETONESUR NEGATIVE 05/29/2016 1215   PROTEINUR NEGATIVE 05/29/2016 1215   NITRITE NEGATIVE 05/29/2016 1215   LEUKOCYTESUR LARGE (A) 05/29/2016 1215   Sepsis Labs: '@LABRCNTIP'$ (procalcitonin:4,lacticidven:4)   Blood Culture (routine x 2)     Status: None (Preliminary result)   Collection Time: 05/29/16 10:15 AM  Result Value Ref Range Status   Specimen Description BLOOD RIGHT ANTECUBITAL  Final   Special Requests BOTTLES DRAWN AEROBIC AND ANAEROBIC 5CC  Final   Culture NO GROWTH 2 DAYS  Final   Report Status PENDING  Incomplete  Blood Culture (routine x 2)     Status: None (Preliminary result)   Collection Time: 05/29/16 10:25 AM  Result Value Ref Range Status   Specimen Description BLOOD RIGHT HAND  Final   Special Requests BOTTLES DRAWN AEROBIC AND ANAEROBIC 5CC  Final   Culture NO GROWTH 2 DAYS  Final   Report Status PENDING  Incomplete  Urine culture     Status: Abnormal   Collection Time: 05/29/16 12:15 PM  Result Value Ref Range Status   Specimen Description URINE, RANDOM  Final   Special Requests NONE  Final   Culture MULTIPLE SPECIES PRESENT, SUGGEST RECOLLECTION (A)  Final   Report Status 05/30/2016 FINAL  Final  MRSA PCR Screening     Status: None   Collection Time: 05/30/16  2:52 AM  Result Value Ref Range Status   MRSA by PCR NEGATIVE NEGATIVE Final      Radiology Studies: Dg Chest Port 1 View Result Date: 05/29/2016 Persistent posterior layering pleural  effusions bilaterally with bibasilar consolidation/atelectasis. Stable tracheostomy No large pneumothorax by portable radiography.  Overall stable exam. Electronically Signed   By: Jerilynn Mages.  Shick M.D.   On: 05/29/2016 19:16   Dg Chest Port 1 View Result Date: 05/29/2016 1. Bilateral pleural effusion with pneumonia or atelectasis. 2. Artifact or nonspecific calcification over the right chest. 3. 1 cm nodule over the left base, likely nipple shadow. Attention on follow-up. Electronically Signed   By: Monte Fantasia M.D.   On: 05/29/2016 10:55      Scheduled Meds: . amiodarone  100 mg Oral Daily  . aspirin  81 mg Per Tube Daily  . chlorhexidine gluconate (MEDLINE KIT)  15 mL Mouth Rinse BID  . famotidine (PEPCID) IV  20 mg Intravenous Q12H  . heparin  5,000 Units Subcutaneous Q8H  . insulin aspart  0-9 Units Subcutaneous Q4H  . lacosamide  100 mg Oral BID  . levETIRAcetam  500 mg Per Tube BID  . linezolid (ZYVOX) IV  600 mg Intravenous Q12H  . mouth rinse  15 mL Mouth Rinse QID  . meropenem (MERREM) IV  1 g Intravenous Q8H  . midodrine  10 mg Oral TID WC   Continuous Infusions: . sodium chloride 10 mL/hr at 05/31/16 2200  . feeding supplement (VITAL AF 1.2 CAL) 1,000 mL (05/31/16 2200)     LOS: 3 days    Time spent: 25 minutes  Greater than 50% of the time spent on counseling and coordinating the care.   Leisa Lenz, MD [facility] Pager [phone]  If 7PM-7AM, please contact night-coverage [email or web address] Password [email or web address] 06/01/2016, 9:02 AM
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
## 2016-06-01 NOTE — Clinical Social Work Note (Signed)
Call received from palliative care informing CSW that the patient's brother would like the patient to go to [facility]. CSW has relayed this information to the appropriate CSW.   Roddie Mc MSW, [place], [place], [phone]

## 2016-06-01 NOTE — Progress Notes (Signed)
CSW received request that pt's brother [facility] [place] [place]. However, that is an LTACH level of care. Not certain that pt meets LTACH criteria.   Osborne Cascoadia Cylan Borum LCSWA [phone]

## 2016-06-02 DIAGNOSIS — G8250 Quadriplegia, unspecified: Secondary | ICD-10-CM

## 2016-06-02 DIAGNOSIS — R509 Fever, unspecified: Secondary | ICD-10-CM | POA: Diagnosis not present

## 2016-06-02 DIAGNOSIS — A419 Sepsis, unspecified organism: Secondary | ICD-10-CM | POA: Diagnosis not present

## 2016-06-02 DIAGNOSIS — J9611 Chronic respiratory failure with hypoxia: Secondary | ICD-10-CM | POA: Diagnosis not present

## 2016-06-02 DIAGNOSIS — E43 Unspecified severe protein-calorie malnutrition: Secondary | ICD-10-CM | POA: Diagnosis not present

## 2016-06-02 LAB — GLUCOSE, CAPILLARY
GLUCOSE-CAPILLARY: 127 mg/dL — AB (ref 65–99)
GLUCOSE-CAPILLARY: 110 mg/dL — AB (ref 65–99)
GLUCOSE-CAPILLARY: 111 mg/dL — AB (ref 65–99)
Glucose-Capillary: 129 mg/dL — ABNORMAL HIGH (ref 65–99)
Glucose-Capillary: 100 mg/dL — ABNORMAL HIGH (ref 65–99)
Glucose-Capillary: 113 mg/dL — ABNORMAL HIGH (ref 65–99)

## 2016-06-02 LAB — BASIC METABOLIC PANEL
Anion gap: 6 (ref 5–15)
BUN: 39 mg/dL — AB (ref 6–20)
CALCIUM: 7.9 mg/dL — AB (ref 8.9–10.3)
CHLORIDE: 114 mmol/L — AB (ref 101–111)
CO2: 25 mmol/L (ref 22–32)
CREATININE: 0.61 mg/dL (ref 0.61–1.24)
GFR calc Af Amer: >60 mL/min (ref >60)
GFR calc non Af Amer: >60 mL/min (ref >60)
GLUCOSE: 96 mg/dL (ref 65–99)
Potassium: 3.3 mmol/L — ABNORMAL LOW (ref 3.5–5.1)
Sodium: 145 mmol/L (ref 135–145)

## 2016-06-02 LAB — CBC
HCT: 29 % — ABNORMAL LOW (ref 39.0–52.0)
Hemoglobin: 9.1 g/dL — ABNORMAL LOW (ref 13.0–17.0)
MCH: 30.4 pg (ref 26.0–34.0)
MCHC: 31.4 g/dL (ref 30.0–36.0)
MCV: 97 fL (ref 78.0–100.0)
PLATELETS: 215 10*3/uL (ref 150–400)
RBC: 2.99 MIL/uL — AB (ref 4.22–5.81)
RDW: 16.9 % — AB (ref 11.5–15.5)
WBC: 17.7 10*3/uL — ABNORMAL HIGH (ref 4.0–10.5)

## 2016-06-02 LAB — PHOSPHORUS: Phosphorus: 2.5 mg/dL (ref 2.5–4.6)

## 2016-06-02 LAB — MAGNESIUM: Magnesium: 1.9 mg/dL (ref 1.7–2.4)

## 2016-06-02 MED ORDER — POTASSIUM CHLORIDE 10 MEQ/100ML IV SOLN
10.0000 meq | INTRAVENOUS | Status: AC
  Administered 2016-06-02 (×4): 10 meq via INTRAVENOUS
  Filled 2016-06-02 (×4): qty 100

## 2016-06-02 MED ORDER — FAMOTIDINE 40 MG/5ML PO SUSR
20.0000 mg | Freq: Two times a day (BID) | ORAL | Status: DC
  Administered 2016-06-02 – 2016-06-18 (×34): 20 mg
  Filled 2016-06-02 (×34): qty 2.5

## 2016-06-02 NOTE — Progress Notes (Signed)
SLP Cancellation Note  Patient Details Name: Dean Graham MRN: [id number] DOB: 1961-07-23   Cancelled treatment:       Reason Eval/Treat Not Completed: Fatigue/lethargy limiting ability to participate  Dimas Aguas, MA, CCC-SLP Acute Rehab SLP [phone]  Fleet Contras 06/02/2016, 1:27 PM

## 2016-06-02 NOTE — Progress Notes (Addendum)
Nutrition Follow-up   INTERVENTION:  Patient is receiving Vital AF 1.2 @ 70 ml / hr via PEG.  Providing: 2016 kcal, 126 gr protein and 1361 ml water.  No new recommendations- pt nutrition support regimen is meeting 100% of est needs sufficient to promote wound healing.  NUTRITION DIAGNOSIS:   Inadequate oral intake related to inability to eat as evidenced by NPO status.   GOAL:  Revised: Patient will meet greater than or equal to 90% of their needs to achieve positive nitrogen balance.    MONITOR:   Vent status, Labs, Weight trends, TF tolerance, Skin  REASON FOR ASSESSMENT:  Consult related to wound healing and low albumin (1.4).    ASSESSMENT:  RD fully assessed pt on 9/21- 55 yo male with previous hx of TBI as a child, AFib on amio, chronic resp failure on trach chronically, GERD, DM II, PEG tube dependent, functional quadriplegia, neurogenic bladder, anemia of chronic disease who presents from SNF with increased WOB, fever, hypotension (85/66) and leukocytosis. He was ventilator dependent after his initial TBI for few years then he did not require vent support until recently 2-3 years ago when he needed to be intubated and trach dependent since. He was wheelchair bound in the past but has been bed ridden for last few years. Has been in a contracted position for few years.  9/23-f/u Patient is tolerating tube feeding at goal rate which is meeting 100% of est energy, and RDI's for essential nutrients. He has Stage 4 to sacrum, stage 3 to right and left hip -moderate green drainage. Stage 2 left heel and left great toe -full thickness per WOC.  His weight has changed significantly since admission:  From 45.4 kg to 64 kg based on hospital records. Nursing reports generalized edema 2+ bilateral lower extremities. His albumin is very low (1.4). Mag and phos are WNL.  Albumin has a half-life of 21 days and is strongly affected by stress response and inflammatory process, therefore,  do not expect to see an improvement in this lab value during acute hospitalization.    Recent Labs Lab 05/29/16 1643 05/30/16 0242 05/31/16 0620  06/01/16 0545 06/01/16 1700 06/02/16 0500  NA 136 138 143  --   --   --   --   K 5.3* 5.5* 4.1  --   --   --   --   CL 99* 108 113*  --   --   --   --   CO2 29 25 24   --   --   --   --   BUN 43* 38* 39*  --   --   --   --   CREATININE 0.78 0.65 0.82  --   --   --   --   CALCIUM 7.8* 7.4* 8.0*  --   --   --   --   MG  --  1.9  --   < > 1.7 1.7 1.9  PHOS  --  3.8  --   < > 3.1 2.6 2.5  GLUCOSE 143* 85 76  --   --   --   --   < > = values in this interval not displayed.   Diet Order:   NPO  Skin: multiple pressure injuries noted above  Last BM:  9/23 loose, small Height:   Ht Readings from Last 1 Encounters:  05/29/16 5\' 10"  (1.778 m)    Weight:   Wt Readings from Last 1 Encounters:  06/02/16 141  lb 1.5 oz (64 kg)    Ideal Body Weight:  64.2 kg  BMI:  Body mass index is 20.24 kg/m.  Estimated Nutritional Needs:   Kcal:  2023  Protein:  100-115 grams  Fluid:  2.0 L  EDUCATION NEEDS:   No education needs identified at this time  Royann Shivers MS,RD,CSG,LDN Office: #[phone] Pager: [phone]

## 2016-06-02 NOTE — Progress Notes (Signed)
Pt is on ATC 28% tolerating it well no distress or complications noted. Trach care done, new IC inserted and lock back into place.

## 2016-06-02 NOTE — Progress Notes (Signed)
Pt is on the Vent at this time tolerating it well.

## 2016-06-02 NOTE — Progress Notes (Addendum)
Patient ID: Dean Graham, male   DOB: 1960-10-31, 55 y.o.   MRN: [id number]  PROGRESS NOTE    Kamareon Sciandra  KTG:[id number] DOB: 03-Aug-1961 DOA: 05/29/2016  PCP: Verneita Griffes, MD   Brief Narrative:  55 y.o. male, SNF resident with a history of TBI, chronic trach-dependent respiratory failure, PEG tube, neurogenic bladder with chronic foley, AFib, diastolic CHF, anemia of chronic disease who was sent from [place] 9/19 with fever, hypotension and increased work of breathing.  In ED, pt was febrile with T max of 100.7 F, hypotensive with BP of 72/60 without improvement after 4L IV fluids. He is white blood cell count was 23. Chest x-ray showed bilateral pleural effusions with pneumonia versus atelectasis. Patient was admitted to ICU for possible need for pressor support. Patient was transferred to stepdown unit 05/30/2016.   Assessment & Plan:   Septic shock presumed due to healthcare-associated pneumonia / Leukocytosis / urinary tract infection in patient with chronic indwelling Foley catheter - Patient in septic shock at the time of the admission with the source suspected to be pneumonia as well as UTI. - Chest x-ray on admission showed bilateral pleural effusions with pneumonia versus atelectasis - Patient had some bacteria on urinalysis with alkaline pH of 7 and triple phosphate crystals suggestive of Proteus infection - Septic shock resolved at this point - Leukocytosis resolved - Continue meropenem and Linezolid, would recommend ongoing therapy providing coverage for proteus UTI. - D/C'ed hydrocortisone, cortisol WNL - Continue midodrine 10 mg TID  Mild troponin elevation - Due to demand ischemic from septic shock - Troponin 0.09 --> 0.07  - No further work up needed at this time   Acute encephalopathy - Due to septic shock - No significant changes in mental status over past 48 hours   Hyperkalemia - Repeat potassium WNL - Check BMP in am  Chronic respiratory  failure, trach-dependent - Stable respiratory status   Chronic diastolic CHF - Holding lasix due to soft BP (106/67)  Chronic atrial fibrillation - CHADS vasc score 2 - On amiodarone and aspirin   Dysphagia, PEG tube-dependent / Severe protein calorie malnutrition  - Chronic, stable  - Continue tube feeds    Quadriplegia - Stable   Neurogenic bladder - Chronic, stable - Has Foley catheter in place  Anemia of chronic disease - Baseline hemoglobin around 8 - Status post 1 unit of PRBC transfusion - Follow up CBC results today   Seizure disorder - Continue keppra, vimpat  Stage 4 sacral pressure ulcer, stage 3 right and left hip sacral ulcer - Appreciate wound care assessment - Stage 4 Sacrum 10cm x 8cm x 0.2cm with 100% red gran tissue, mod amt drainage green, mild odor - Healing stage 3 right hip 2.5cm x 2 cm 100 % red granulating, no drainage - Left hip Stage 3 ulcer 4.5cm x 7cm x 1cm, 100% red granulating, mod amt green drainage, mild odor  - Left great toe full thickness 0.8cm x 0.8cm 100% red gran tissue, no drainage or odor - L flank MARSI 0.5cm x 0.3cm pink and dry - L knee full thickness 2.5cm x 2cm red and moist no drainage or odor noted - L heel Stage 2 0.3cm x 0.3cm pink and moist no drainage or odor - Per WOC, apply quacell for sacrum and left hip to absorb drainage and provide antimicrobial benifits and allevyn foam to the others   DVT prophylaxis: Heparin subcutaneous Code Status: DNR/DNI Family Communication: No family at the bedside Disposition Plan: D/C to  SNF once medically stable. Possibly by 06/04/2016   Consultants:   CCM, Dr. Titus Mould  Palliative care team  Procedures:   Foley, trach, PEG all prior to admission  Antimicrobials:   Linezolid   Meropenem    Subjective: No overnight events.  Objective: Vitals:   06/01/16 1700 06/01/16 2007 06/01/16 2303 06/02/16 0403  BP: 110/81 111/80 107/80 105/82  Pulse: 98 99 (!) 110 (!)  110  Resp: 17 17 (!) 22 (!) 22  Temp:  98 F (36.7 C) 97.8 F (36.6 C) 98.3 F (36.8 C)  TempSrc:  Oral Oral Oral  SpO2: 100% 99% 98% 100%  Weight:    64 kg (141 lb 1.5 oz)  Height:        Intake/Output Summary (Last 24 hours) at 06/02/16 0743 Last data filed at 06/02/16 0700  Gross per 24 hour  Intake             3740 ml  Output              475 ml  Net             3265 ml   Filed Weights   05/31/16 0400 06/01/16 0004 06/02/16 0403  Weight: 62 kg (136 lb 11 oz) 63 kg (138 lb 14.2 oz) 64 kg (141 lb 1.5 oz)    Examination:  General exam: Appears calm and comfortable, no distress  Respiratory system: Diminished, no wheezing; trach in place  Cardiovascular system: S1 & S2 heard, tachycardic  Gastrointestinal system: Non tender abd, (+) BS Central nervous system: All 4 extremities contracted  Extremities: Contracted, +1 pitting edema Skin: pressure ulcers in sacrum, hip  Psychiatry: Unable to assess due to pt mental status   Data Reviewed: I have personally reviewed following labs and imaging studies  CBC:  Recent Labs Lab 05/29/16 1643 05/30/16 0242 05/31/16 0453 05/31/16 0930  WBC 23.1* 11.4* 10.1  --   NEUTROABS 19.8*  --  8.8*  --   HGB 8.8* 7.0* 6.1* 9.1*  HCT 28.5* 22.7* 20.6* 28.4*  MCV 101.4* 101.3* 101.0*  --   PLT 277 247 213  --    Basic Metabolic Panel:  Recent Labs Lab 05/29/16 1643 05/30/16 0242 05/31/16 0620 05/31/16 1513 06/01/16 0545 06/01/16 1700 06/02/16 0500  NA 136 138 143  --   --   --   --   K 5.3* 5.5* 4.1  --   --   --   --   CL 99* 108 113*  --   --   --   --   CO2 _0 --   --   --   --   GLUCOSE 143* 85 76  --   --   --   --   BUN 43* 38* 39*  --   --   --   --   CREATININE 0.78 0.65 0.82  --   --   --   --   CALCIUM 7.8* 7.4* 8.0*  --   --   --   --   MG  --  1.9  --  2.0 1.7 1.7 1.9  PHOS  --  3.8  --  4.7* 3.1 2.6 2.5   GFR: Estimated Creatinine Clearance: 90 mL/min (by C-G formula based on SCr of 0.82  mg/dL). Liver Function Tests:  Recent Labs Lab 05/29/16 1643  AST 75*  ALT 62  ALKPHOS 214*  BILITOT 0.4  PROT 5.8*  ALBUMIN 1.4*  No results for input(s): LIPASE, AMYLASE in the last 168 hours. No results for input(s): AMMONIA in the last 168 hours. Coagulation Profile:  Recent Labs Lab 05/29/16 1608  INR 1.84   Cardiac Enzymes:  Recent Labs Lab 05/29/16 1608 05/29/16 2052 05/30/16 0125 05/30/16 0242  TROPONINI 0.12* 0.07* 0.09* 0.07*   BNP (last 3 results) No results for input(s): PROBNP in the last 8760 hours. HbA1C: No results for input(s): HGBA1C in the last 72 hours. CBG:  Recent Labs Lab 06/01/16 1212 06/01/16 1613 06/01/16 2012 06/01/16 2308 06/02/16 0353  GLUCAP 158* 147* 128* 126* 127*   Lipid Profile: No results for input(s): CHOL, HDL, LDLCALC, TRIG, CHOLHDL, LDLDIRECT in the last 72 hours. Thyroid Function Tests: No results for input(s): TSH, T4TOTAL, FREET4, T3FREE, THYROIDAB in the last 72 hours. Anemia Panel: No results for input(s): VITAMINB12, FOLATE, FERRITIN, TIBC, IRON, RETICCTPCT in the last 72 hours. Urine analysis:    Component Value Date/Time   COLORURINE YELLOW 05/29/2016 1215   APPEARANCEUR TURBID (A) 05/29/2016 1215   LABSPEC 1.022 05/29/2016 1215   PHURINE 7.0 05/29/2016 1215   GLUCOSEU NEGATIVE 05/29/2016 1215   HGBUR NEGATIVE 05/29/2016 1215   BILIRUBINUR NEGATIVE 05/29/2016 1215   KETONESUR NEGATIVE 05/29/2016 1215   PROTEINUR NEGATIVE 05/29/2016 1215   NITRITE NEGATIVE 05/29/2016 1215   LEUKOCYTESUR LARGE (A) 05/29/2016 1215   Sepsis Labs: _0 (procalcitonin:4,lacticidven:4)   Blood Culture (routine x 2)     Status: None (Preliminary result)   Collection Time: 05/29/16 10:15 AM  Result Value Ref Range Status   Specimen Description BLOOD RIGHT ANTECUBITAL  Final   Special Requests BOTTLES DRAWN AEROBIC AND ANAEROBIC 5CC  Final   Culture NO GROWTH 2 DAYS  Final   Report Status PENDING  Incomplete   Blood Culture (routine x 2)     Status: None (Preliminary result)   Collection Time: 05/29/16 10:25 AM  Result Value Ref Range Status   Specimen Description BLOOD RIGHT HAND  Final   Special Requests BOTTLES DRAWN AEROBIC AND ANAEROBIC 5CC  Final   Culture NO GROWTH 2 DAYS  Final   Report Status PENDING  Incomplete  Urine culture     Status: Abnormal   Collection Time: 05/29/16 12:15 PM  Result Value Ref Range Status   Specimen Description URINE, RANDOM  Final   Special Requests NONE  Final   Culture MULTIPLE SPECIES PRESENT, SUGGEST RECOLLECTION (A)  Final   Report Status 05/30/2016 FINAL  Final  MRSA PCR Screening     Status: None   Collection Time: 05/30/16  2:52 AM  Result Value Ref Range Status   MRSA by PCR NEGATIVE NEGATIVE Final      Radiology Studies: Dg Chest Port 1 View Result Date: 05/29/2016 Persistent posterior layering pleural effusions bilaterally with bibasilar consolidation/atelectasis. Stable tracheostomy No large pneumothorax by portable radiography.  Overall stable exam. Electronically Signed   By: Jerilynn Mages.  Shick M.D.   On: 05/29/2016 19:16   Dg Chest Port 1 View Result Date: 05/29/2016 1. Bilateral pleural effusion with pneumonia or atelectasis. 2. Artifact or nonspecific calcification over the right chest. 3. 1 cm nodule over the left base, likely nipple shadow. Attention on follow-up. Electronically Signed   By: Monte Fantasia M.D.   On: 05/29/2016 10:55      Scheduled Meds: . amiodarone  100 mg Oral Daily  . aspirin  81 mg Per Tube Daily  . chlorhexidine gluconate (MEDLINE KIT)  15 mL Mouth Rinse BID  . famotidine (PEPCID) IV  20 mg Intravenous Q12H  . heparin  5,000 Units Subcutaneous Q8H  . insulin aspart  0-9 Units Subcutaneous Q4H  . lacosamide  100 mg Oral BID  . levETIRAcetam  500 mg Per Tube BID  . linezolid (ZYVOX) IV  600 mg Intravenous Q12H  . mouth rinse  15 mL Mouth Rinse QID  . meropenem (MERREM) IV  1 g Intravenous Q8H  . midodrine  10  mg Oral TID WC   Continuous Infusions: . sodium chloride 10 mL/hr at 05/31/16 2200  . feeding supplement (VITAL AF 1.2 CAL) 1,000 mL (06/02/16 0516)     LOS: 4 days    Time spent: 25 minutes  Greater than 50% of the time spent on counseling and coordinating the care.   Leisa Lenz, MD [facility] Pager [phone]  If 7PM-7AM, please contact night-coverage [email or web address] Password [facility] 06/02/2016, 7:43 AM

## 2016-06-03 DIAGNOSIS — R6521 Severe sepsis with septic shock: Secondary | ICD-10-CM | POA: Diagnosis not present

## 2016-06-03 DIAGNOSIS — A419 Sepsis, unspecified organism: Secondary | ICD-10-CM | POA: Diagnosis not present

## 2016-06-03 DIAGNOSIS — J9611 Chronic respiratory failure with hypoxia: Secondary | ICD-10-CM | POA: Diagnosis not present

## 2016-06-03 DIAGNOSIS — R509 Fever, unspecified: Secondary | ICD-10-CM | POA: Diagnosis not present

## 2016-06-03 DIAGNOSIS — E43 Unspecified severe protein-calorie malnutrition: Secondary | ICD-10-CM | POA: Diagnosis not present

## 2016-06-03 LAB — CBC
HEMATOCRIT: 28.2 % — AB (ref 39.0–52.0)
HEMOGLOBIN: 9 g/dL — AB (ref 13.0–17.0)
MCH: 31 pg (ref 26.0–34.0)
MCHC: 31.9 g/dL (ref 30.0–36.0)
MCV: 97.2 fL (ref 78.0–100.0)
Platelets: 210 10*3/uL (ref 150–400)
RBC: 2.9 MIL/uL — ABNORMAL LOW (ref 4.22–5.81)
RDW: 16.8 % — AB (ref 11.5–15.5)
WBC: 20.1 10*3/uL — ABNORMAL HIGH (ref 4.0–10.5)

## 2016-06-03 LAB — BASIC METABOLIC PANEL
Anion gap: 3 — ABNORMAL LOW (ref 5–15)
BUN: 35 mg/dL — ABNORMAL HIGH (ref 6–20)
CHLORIDE: 114 mmol/L — AB (ref 101–111)
CO2: 26 mmol/L (ref 22–32)
CREATININE: 0.58 mg/dL — AB (ref 0.61–1.24)
Calcium: 7.7 mg/dL — ABNORMAL LOW (ref 8.9–10.3)
GFR calc non Af Amer: >60 mL/min (ref >60)
GLUCOSE: 105 mg/dL — AB (ref 65–99)
Potassium: 3.9 mmol/L (ref 3.5–5.1)
Sodium: 143 mmol/L (ref 135–145)

## 2016-06-03 LAB — GLUCOSE, CAPILLARY
GLUCOSE-CAPILLARY: 107 mg/dL — AB (ref 65–99)
GLUCOSE-CAPILLARY: 118 mg/dL — AB (ref 65–99)
GLUCOSE-CAPILLARY: 99 mg/dL (ref 65–99)
GLUCOSE-CAPILLARY: 106 mg/dL — AB (ref 65–99)
Glucose-Capillary: 122 mg/dL — ABNORMAL HIGH (ref 65–99)
Glucose-Capillary: 95 mg/dL (ref 65–99)

## 2016-06-03 LAB — CULTURE, BLOOD (ROUTINE X 2)
CULTURE: NO GROWTH
CULTURE: NO GROWTH

## 2016-06-03 NOTE — Progress Notes (Signed)
Pt is on Vent QHS tolerating it well at this time no distress noted

## 2016-06-03 NOTE — Progress Notes (Signed)
Pt is on ATC 28% tolerating it well no distress or complications noted. Suction patient got back moderate amount of tan thick secretions. Pt is stable at this time sats 99%

## 2016-06-03 NOTE — Progress Notes (Signed)
Patient ID: Dean Graham, male   DOB: May 30, 1961, 55 y.o.   MRN: [id number]  PROGRESS NOTE    Dean Graham  NFA:[id number] DOB: 1960/10/24 DOA: 05/29/2016  PCP: Verneita Griffes, MD   Brief Narrative:  55 y.o. male, SNF resident with a history of TBI, chronic trach-dependent respiratory failure, PEG tube, neurogenic bladder with chronic foley, AFib, diastolic CHF, anemia of chronic disease who was sent from [place] 9/19 with fever, hypotension and increased work of breathing.  In ED, pt was febrile with T max of 100.7 F, hypotensive with BP of 72/60 without improvement after 4L IV fluids. He is white blood cell count was 23. Chest x-ray showed bilateral pleural effusions with pneumonia versus atelectasis. Patient was admitted to ICU for possible need for pressor support. Patient was transferred to stepdown unit 05/30/2016.   Assessment & Plan:   Septic shock presumed due to healthcare-associated pneumonia / Leukocytosis / urinary tract infection in patient with chronic indwelling Foley catheter - Patient in septic shock at the time of the admission with the source suspected to be pneumonia as well as UTI. - Chest x-ray on admission showed bilateral pleural effusions with pneumonia versus atelectasis - Patient had some bacteria on urinalysis with alkaline pH of 7 and triple phosphate crystals suggestive of Proteus infection - Septic shock resolved at this point - Continue meropenem and Linezolid, would recommend ongoing therapy providing coverage for proteus UTI. - D/C'ed hydrocortisone, cortisol WNL - Continue midodrine 10 mg TID  Mild troponin elevation - Due to demand ischemic from septic shock - Troponin 0.09 --> 0.07  - No further work up needed at this time   Acute encephalopathy - Due to septic shock - No significant changes in mental status over past 48 hours   Hyperkalemia / Hypokalemia  - Repeat potassium WNL  Chronic respiratory failure, trach-dependent - Stable  respiratory status   Chronic diastolic CHF - Holding lasix due to soft BP, 111/80  Chronic atrial fibrillation - CHADS vasc score 2 - On amiodarone and aspirin   Dysphagia, PEG tube-dependent / Severe protein calorie malnutrition  - Chronic, stable  - Continue tube feeds    Quadriplegia - Stable   Neurogenic bladder - Chronic, stable - Has Foley catheter in place  Anemia of chronic disease - Baseline hemoglobin around 8 - Status post 1 unit of PRBC transfusion - hemoglobin is 9.0 this am  Seizure disorder - Continue keppra, vimpat  Stage 4 sacral pressure ulcer, stage 3 right and left hip sacral ulcer - Appreciate wound care assessment - Stage 4 Sacrum 10cm x 8cm x 0.2cm with 100% red gran tissue, mod amt drainage green, mild odor - Healing stage 3 right hip 2.5cm x 2 cm 100 % red granulating, no drainage - Left hip Stage 3 ulcer 4.5cm x 7cm x 1cm, 100% red granulating, mod amt green drainage, mild odor  - Left great toe full thickness 0.8cm x 0.8cm 100% red gran tissue, no drainage or odor - L flank MARSI 0.5cm x 0.3cm pink and dry - L knee full thickness 2.5cm x 2cm red and moist no drainage or odor noted - L heel Stage 2 0.3cm x 0.3cm pink and moist no drainage or odor - Per WOC, apply quacell for sacrum and left hip to absorb drainage and provide antimicrobial benifits and allevyn foam to the others   DVT prophylaxis: Heparin subcutaneous Code Status: DNR/DNI Family Communication: No family at the bedside Disposition Plan: D/C to SNF once medically stable. With worsening  leukocytosis he may need few more days in hospital until this improves    Consultants:   CCM, Dr. Titus Mould  Palliative care team  Procedures:   Foley, trach, PEG all prior to admission  Antimicrobials:   Linezolid   Meropenem    Subjective: No overnight events.  Objective: Vitals:   06/03/16 0721 06/03/16 0735 06/03/16 0800 06/03/16 0900  BP: 94/61 94/61 114/77 111/80    Pulse:  (!) 114 (!) 112 (!) 108  Resp:  (!) 26 (!) 26 (!) 22  Temp: 99.1 F (37.3 C)     TempSrc: Axillary     SpO2:  98% 99% 100%  Weight:      Height:        Intake/Output Summary (Last 24 hours) at 06/03/16 1122 Last data filed at 06/03/16 0800  Gross per 24 hour  Intake             2900 ml  Output              545 ml  Net             2355 ml   Filed Weights   06/01/16 0004 06/02/16 0403 06/03/16 0401  Weight: 63 kg (138 lb 14.2 oz) 64 kg (141 lb 1.5 oz) 66.9 kg (147 lb 7.8 oz)    Examination:  General exam: No distress  Respiratory system: Diminished, no wheezing; trach in place  Cardiovascular system: S1 & S2 heard, tachycardic  Gastrointestinal system: (+) BS, non tender abdomen, peg in place  Central nervous system: All 4 extremities contracted  Extremities: Contracted, +1 pitting edema Skin: pressure ulcers in sacrum, hip  Psychiatry: Unable to assess due to pt mental status   Data Reviewed: I have personally reviewed following labs and imaging studies  CBC:  Recent Labs Lab 05/29/16 1643 05/30/16 0242 05/31/16 0453 05/31/16 0930 06/02/16 1200 06/03/16 0310  WBC 23.1* 11.4* 10.1  --  17.7* 20.1*  NEUTROABS 19.8*  --  8.8*  --   --   --   HGB 8.8* 7.0* 6.1* 9.1* 9.1* 9.0*  HCT 28.5* 22.7* 20.6* 28.4* 29.0* 28.2*  MCV 101.4* 101.3* 101.0*  --  97.0 97.2  PLT 277 247 213  --  215 170   Basic Metabolic Panel:  Recent Labs Lab 05/29/16 1643 05/30/16 0242 05/31/16 0620 05/31/16 1513 06/01/16 0545 06/01/16 1700 06/02/16 0500 06/02/16 1200 06/03/16 0310  NA 136 138 143  --   --   --   --  145 143  K 5.3* 5.5* 4.1  --   --   --   --  3.3* 3.9  CL 99* 108 113*  --   --   --   --  114* 114*  CO2 '29 25 24  '$ --   --   --   --  25 26  GLUCOSE 143* 85 76  --   --   --   --  96 105*  BUN 43* 38* 39*  --   --   --   --  39* 35*  CREATININE 0.78 0.65 0.82  --   --   --   --  0.61 0.58*  CALCIUM 7.8* 7.4* 8.0*  --   --   --   --  7.9* 7.7*  MG  --  1.9   --  2.0 1.7 1.7 1.9  --   --   PHOS  --  3.8  --  4.7* 3.1 2.6 2.5  --   --  GFR: Estimated Creatinine Clearance: 96.4 mL/min (by C-G formula based on SCr of 0.58 mg/dL (L)). Liver Function Tests:  Recent Labs Lab 05/29/16 1643  AST 75*  ALT 62  ALKPHOS 214*  BILITOT 0.4  PROT 5.8*  ALBUMIN 1.4*   No results for input(s): LIPASE, AMYLASE in the last 168 hours. No results for input(s): AMMONIA in the last 168 hours. Coagulation Profile:  Recent Labs Lab 05/29/16 1608  INR 1.84   Cardiac Enzymes:  Recent Labs Lab 05/29/16 1608 05/29/16 2052 05/30/16 0125 05/30/16 0242  TROPONINI 0.12* 0.07* 0.09* 0.07*   BNP (last 3 results) No results for input(s): PROBNP in the last 8760 hours. HbA1C: No results for input(s): HGBA1C in the last 72 hours. CBG:  Recent Labs Lab 06/02/16 1610 06/02/16 2021 06/02/16 2322 06/03/16 0354 06/03/16 0727  GLUCAP 110* 113* 111* 107* 118*   Lipid Profile: No results for input(s): CHOL, HDL, LDLCALC, TRIG, CHOLHDL, LDLDIRECT in the last 72 hours. Thyroid Function Tests: No results for input(s): TSH, T4TOTAL, FREET4, T3FREE, THYROIDAB in the last 72 hours. Anemia Panel: No results for input(s): VITAMINB12, FOLATE, FERRITIN, TIBC, IRON, RETICCTPCT in the last 72 hours. Urine analysis:    Component Value Date/Time   COLORURINE YELLOW 05/29/2016 1215   APPEARANCEUR TURBID (A) 05/29/2016 1215   LABSPEC 1.022 05/29/2016 1215   PHURINE 7.0 05/29/2016 1215   GLUCOSEU NEGATIVE 05/29/2016 1215   HGBUR NEGATIVE 05/29/2016 1215   BILIRUBINUR NEGATIVE 05/29/2016 1215   KETONESUR NEGATIVE 05/29/2016 1215   PROTEINUR NEGATIVE 05/29/2016 1215   NITRITE NEGATIVE 05/29/2016 1215   LEUKOCYTESUR LARGE (A) 05/29/2016 1215   Sepsis Labs: '@[id number]'$ (procalcitonin:4,lacticidven:4)   Blood Culture (routine x 2)     Status: None (Preliminary result)   Collection Time: 05/29/16 10:15 AM  Result Value Ref Range Status   Specimen Description  BLOOD RIGHT ANTECUBITAL  Final   Special Requests BOTTLES DRAWN AEROBIC AND ANAEROBIC 5CC  Final   Culture NO GROWTH 2 DAYS  Final   Report Status PENDING  Incomplete  Blood Culture (routine x 2)     Status: None (Preliminary result)   Collection Time: 05/29/16 10:25 AM  Result Value Ref Range Status   Specimen Description BLOOD RIGHT HAND  Final   Special Requests BOTTLES DRAWN AEROBIC AND ANAEROBIC 5CC  Final   Culture NO GROWTH 2 DAYS  Final   Report Status PENDING  Incomplete  Urine culture     Status: Abnormal   Collection Time: 05/29/16 12:15 PM  Result Value Ref Range Status   Specimen Description URINE, RANDOM  Final   Special Requests NONE  Final   Culture MULTIPLE SPECIES PRESENT, SUGGEST RECOLLECTION (A)  Final   Report Status 05/30/2016 FINAL  Final  MRSA PCR Screening     Status: None   Collection Time: 05/30/16  2:52 AM  Result Value Ref Range Status   MRSA by PCR NEGATIVE NEGATIVE Final      Radiology Studies: Dg Chest Port 1 View Result Date: 05/29/2016 Persistent posterior layering pleural effusions bilaterally with bibasilar consolidation/atelectasis. Stable tracheostomy No large pneumothorax by portable radiography.  Overall stable exam. Electronically Signed   By: Jerilynn Mages.  Shick M.D.   On: 05/29/2016 19:16   Dg Chest Port 1 View Result Date: 05/29/2016 1. Bilateral pleural effusion with pneumonia or atelectasis. 2. Artifact or nonspecific calcification over the right chest. 3. 1 cm nodule over the left base, likely nipple shadow. Attention on follow-up. Electronically Signed   By: Neva Seat.D.  On: 05/29/2016 10:55      Scheduled Meds: . amiodarone  100 mg Oral Daily  . aspirin  81 mg Per Tube Daily  . chlorhexidine gluconate (MEDLINE KIT)  15 mL Mouth Rinse BID  . famotidine  20 mg Per Tube BID  . heparin  5,000 Units Subcutaneous Q8H  . insulin aspart  0-9 Units Subcutaneous Q4H  . lacosamide  100 mg Oral BID  . levETIRAcetam  500 mg Per Tube BID    . linezolid (ZYVOX) IV  600 mg Intravenous Q12H  . mouth rinse  15 mL Mouth Rinse QID  . meropenem (MERREM) IV  1 g Intravenous Q8H  . midodrine  10 mg Oral TID WC   Continuous Infusions: . sodium chloride 10 mL/hr at 05/31/16 2200  . feeding supplement (VITAL AF 1.2 CAL) 1,000 mL (06/02/16 2052)     LOS: 5 days    Time spent: 15 minutes  Greater than 50% of the time spent on counseling and coordinating the care.   Leisa Lenz, MD [facility] Pager [phone]  If 7PM-7AM, please contact night-coverage [email or web address] Password [email or web address] 06/03/2016, 11:22 AM

## 2016-06-03 NOTE — Progress Notes (Signed)
trach care done. Site cleansed with NS and dried dressing applied. New IC inserted and lock back into place without complications. Pt is stable no distress noted.

## 2016-06-04 ENCOUNTER — Inpatient Hospital Stay (HOSPITAL_COMMUNITY): Payer: Medicare Other

## 2016-06-04 DIAGNOSIS — L89219 Pressure ulcer of right hip, unspecified stage: Secondary | ICD-10-CM

## 2016-06-04 DIAGNOSIS — A419 Sepsis, unspecified organism: Secondary | ICD-10-CM | POA: Diagnosis not present

## 2016-06-04 DIAGNOSIS — Z95828 Presence of other vascular implants and grafts: Secondary | ICD-10-CM

## 2016-06-04 DIAGNOSIS — Z960 Presence of urogenital implants: Secondary | ICD-10-CM

## 2016-06-04 DIAGNOSIS — Z978 Presence of other specified devices: Secondary | ICD-10-CM

## 2016-06-04 DIAGNOSIS — Z66 Do not resuscitate: Secondary | ICD-10-CM

## 2016-06-04 DIAGNOSIS — R532 Functional quadriplegia: Secondary | ICD-10-CM

## 2016-06-04 DIAGNOSIS — Z9911 Dependence on respirator [ventilator] status: Secondary | ICD-10-CM

## 2016-06-04 DIAGNOSIS — Z794 Long term (current) use of insulin: Secondary | ICD-10-CM

## 2016-06-04 DIAGNOSIS — Z7189 Other specified counseling: Secondary | ICD-10-CM | POA: Diagnosis not present

## 2016-06-04 DIAGNOSIS — L89159 Pressure ulcer of sacral region, unspecified stage: Secondary | ICD-10-CM

## 2016-06-04 DIAGNOSIS — Y95 Nosocomial condition: Secondary | ICD-10-CM

## 2016-06-04 DIAGNOSIS — J189 Pneumonia, unspecified organism: Secondary | ICD-10-CM | POA: Diagnosis not present

## 2016-06-04 DIAGNOSIS — L89229 Pressure ulcer of left hip, unspecified stage: Secondary | ICD-10-CM

## 2016-06-04 DIAGNOSIS — R509 Fever, unspecified: Secondary | ICD-10-CM | POA: Diagnosis not present

## 2016-06-04 DIAGNOSIS — Z79899 Other long term (current) drug therapy: Secondary | ICD-10-CM

## 2016-06-04 DIAGNOSIS — K219 Gastro-esophageal reflux disease without esophagitis: Secondary | ICD-10-CM

## 2016-06-04 DIAGNOSIS — E119 Type 2 diabetes mellitus without complications: Secondary | ICD-10-CM

## 2016-06-04 DIAGNOSIS — Z515 Encounter for palliative care: Secondary | ICD-10-CM | POA: Diagnosis not present

## 2016-06-04 DIAGNOSIS — Z7982 Long term (current) use of aspirin: Secondary | ICD-10-CM

## 2016-06-04 DIAGNOSIS — J9611 Chronic respiratory failure with hypoxia: Secondary | ICD-10-CM | POA: Diagnosis not present

## 2016-06-04 DIAGNOSIS — Z7401 Bed confinement status: Secondary | ICD-10-CM

## 2016-06-04 DIAGNOSIS — Z930 Tracheostomy status: Secondary | ICD-10-CM

## 2016-06-04 DIAGNOSIS — N319 Neuromuscular dysfunction of bladder, unspecified: Secondary | ICD-10-CM

## 2016-06-04 DIAGNOSIS — Z888 Allergy status to other drugs, medicaments and biological substances status: Secondary | ICD-10-CM

## 2016-06-04 DIAGNOSIS — B964 Proteus (mirabilis) (morganii) as the cause of diseases classified elsewhere: Secondary | ICD-10-CM

## 2016-06-04 DIAGNOSIS — R6521 Severe sepsis with septic shock: Secondary | ICD-10-CM | POA: Diagnosis not present

## 2016-06-04 DIAGNOSIS — Z881 Allergy status to other antibiotic agents status: Secondary | ICD-10-CM

## 2016-06-04 DIAGNOSIS — I482 Chronic atrial fibrillation: Secondary | ICD-10-CM | POA: Diagnosis not present

## 2016-06-04 LAB — CBC WITH DIFFERENTIAL/PLATELET
BASOS ABS: 0 10*3/uL (ref 0.0–0.1)
BASOS PCT: 0 %
EOS PCT: 0 %
Eosinophils Absolute: 0.1 10*3/uL (ref 0.0–0.7)
HCT: 28.3 % — ABNORMAL LOW (ref 39.0–52.0)
Hemoglobin: 8.9 g/dL — ABNORMAL LOW (ref 13.0–17.0)
Lymphocytes Relative: 9 %
Lymphs Abs: 2.2 10*3/uL (ref 0.7–4.0)
MCH: 30.8 pg (ref 26.0–34.0)
MCHC: 31.4 g/dL (ref 30.0–36.0)
MCV: 97.9 fL (ref 78.0–100.0)
MONO ABS: 0.7 10*3/uL (ref 0.1–1.0)
MONOS PCT: 3 %
Neutro Abs: 20.5 10*3/uL — ABNORMAL HIGH (ref 1.7–7.7)
Neutrophils Relative %: 88 %
PLATELETS: 178 10*3/uL (ref 150–400)
RBC: 2.89 MIL/uL — ABNORMAL LOW (ref 4.22–5.81)
RDW: 16.7 % — AB (ref 11.5–15.5)
WBC: 23.5 10*3/uL — ABNORMAL HIGH (ref 4.0–10.5)

## 2016-06-04 LAB — URINALYSIS, ROUTINE W REFLEX MICROSCOPIC
Bilirubin Urine: NEGATIVE
GLUCOSE, UA: NEGATIVE mg/dL
HGB URINE DIPSTICK: NEGATIVE
KETONES UR: NEGATIVE mg/dL
Leukocytes, UA: NEGATIVE
Nitrite: NEGATIVE
PH: 6 (ref 5.0–8.0)
PROTEIN: NEGATIVE mg/dL
Specific Gravity, Urine: 1.027 (ref 1.005–1.030)

## 2016-06-04 LAB — GLUCOSE, CAPILLARY
GLUCOSE-CAPILLARY: 106 mg/dL — AB (ref 65–99)
GLUCOSE-CAPILLARY: 123 mg/dL — AB (ref 65–99)
GLUCOSE-CAPILLARY: 89 mg/dL (ref 65–99)
GLUCOSE-CAPILLARY: 113 mg/dL — AB (ref 65–99)
Glucose-Capillary: 94 mg/dL (ref 65–99)

## 2016-06-04 LAB — C DIFFICILE QUICK SCREEN W PCR REFLEX
C DIFFICILE (CDIFF) INTERP: NOT DETECTED
C DIFFICILE (CDIFF) TOXIN: NEGATIVE
C Diff antigen: NEGATIVE

## 2016-06-04 MED ORDER — SODIUM CHLORIDE 0.9 % IV BOLUS (SEPSIS)
1000.0000 mL | Freq: Once | INTRAVENOUS | Status: AC
  Administered 2016-06-04: 1000 mL via INTRAVENOUS

## 2016-06-04 NOTE — Progress Notes (Signed)
SLP Cancellation Note  Patient Details Name: Dean Graham MRN: [id number] DOB: 10/12/1960   Cancelled treatment:       Reason Eval/Treat Not Completed: Medical issues which prohibited therapy (On vent, fatigue, lethargy)   Ferdinand Lango MA, CCC-SLP [phone]   [name] 06/04/2016, 9:49 AM

## 2016-06-04 NOTE — Progress Notes (Signed)
.es Patient ID: Dean Graham, male   DOB: 05/06/1961, 55 y.o.   MRN: [id number]  PROGRESS NOTE    Dean Graham   MLY:[id number]  DOB: 09/04/1961  DOA: 05/29/2016  PCP: Verneita Griffes, MD   Brief Narrative:  55 y.o. male, SNF resident with a history of TBI, chronic trach-dependent respiratory failure, PEG tube, neurogenic bladder with chronic foley, AFib, diastolic CHF, anemia of chronic disease who was sent from [place] 9/19 with fever, hypotension and increased work of breathing.  In ED, pt was febrile with T max of 100.7 F, hypotensive with BP of 72/60 without improvement after 4L IV fluids. He is white blood cell count was 23. Chest x-ray showed bilateral pleural effusions with pneumonia versus atelectasis. Patient was admitted to ICU for possible need for pressor support. Patient was transferred to stepdown unit 05/30/2016. On meropenem and Linezolid. Now with increasing white count   Assessment & Plan:   Septic shock presumed due to healthcare-associated pneumonia / Leukocytosis / urinary tract infection in patient with chronic indwelling Foley catheter - blood cx negative, although patient remains tachycardic and hypotensive with increasing white count. Patient in septic shock at the time of the admission with the source suspected to be pneumonia as well as UTI. - Patient remains afebrile - Chest x-ray on admission showed bilateral pleural effusions with pneumonia versus atelectasis - Patient had some bacteria on urinalysis with alkaline pH of 7 and triple phosphate crystals suggestive of Proteus infection - Will continue meropenem and Linezolid for now -  no bacteria has been identified if tomorrow no improvement in white count consider stopping antibiotic and treat hypotension with IV fluids as tolerated. Consider ID consult - Will give a saline bolus  - Continue midodrine 10 mg TID - Repeat CBC  Mild troponin elevation - Due to demand ischemic from septic shock - Troponin  0.09 --> 0.07  - No further work up needed at this time   Acute encephalopathy - Due to septic shock - No significant changes in mental status over past 48 hours   Hyperkalemia / Hypokalemia  - Repeat potassium WNL  Chronic respiratory failure, trach-dependent - Stable respiratory status   Chronic diastolic CHF - Holding lasix due to soft BP, 111/80  Chronic atrial fibrillation - CHADS vasc score 2 - On amiodarone and aspirin   Dysphagia, PEG tube-dependent / Severe protein calorie malnutrition  - Chronic, stable  - Continue tube feeds    Quadriplegia - Stable   Neurogenic bladder - Chronic, stable - Has Foley catheter in place  Anemia of chronic disease - Baseline hemoglobin around 8 - Status post 1 unit of PRBC transfusion - Monitor CBC   Seizure disorder - Continue keppra, vimpat  Stage 4 sacral pressure ulcer, stage 3 right and left hip sacral ulcer - Appreciate wound care assessment - Stage 4 Sacrum 10cm x 8cm x 0.2cm with 100% red gran tissue, mod amt drainage green, mild odor - Healing stage 3 right hip 2.5cm x 2 cm 100 % red granulating, no drainage - Left hip Stage 3 ulcer 4.5cm x 7cm x 1cm, 100% red granulating, mod amt green drainage, mild odor  - Left great toe full thickness 0.8cm x 0.8cm 100% red gran tissue, no drainage or odor - L flank MARSI 0.5cm x 0.3cm pink and dry - L knee full thickness 2.5cm x 2cm red and moist no drainage or odor noted - L heel Stage 2 0.3cm x 0.3cm pink and moist no drainage or odor -  Per WOC, apply quacell for sacrum and left hip to absorb drainage and provide antimicrobial benifits and allevyn foam to the others   DVT prophylaxis: Heparin subcutaneous Code Status: DNR/DNI Family Communication: No family at the bedside Disposition Plan: D/C to SNF once medically stable. With worsening leukocytosis he may need few more days in hospital until medically improves    Consultants:   CCM, Dr. Titus Mould  Palliative  care team  Procedures:   Foley, trach, PEG all prior to admission  Antimicrobials:   Linezolid   Meropenem    Subjective: No overnight events.  Objective: Vitals:   06/04/16 0311 06/04/16 0354 06/04/16 0402 06/04/16 0801  BP: 96/72  101/74 97/76  Pulse: (!) 105  (!) 105 (!) 111  Resp: 19  15   Temp:   98.3 F (36.8 C) 97.9 F (36.6 C)  TempSrc:   Axillary Axillary  SpO2: 100%  100%   Weight:  67.2 kg (148 lb 2.4 oz)    Height:        Intake/Output Summary (Last 24 hours) at 06/04/16 0918 Last data filed at 06/03/16 2305  Gross per 24 hour  Intake             1190 ml  Output              950 ml  Net              240 ml   Filed Weights   06/02/16 0403 06/03/16 0401 06/04/16 0354  Weight: 64 kg (141 lb 1.5 oz) 66.9 kg (147 lb 7.8 oz) 67.2 kg (148 lb 2.4 oz)    Examination:  General exam: No distress, Respiratory system: Diminished, diffuse rhonchi transmitted sound from trach.   Cardiovascular system: S1 & S2 heard, tachycardic @ 114 Gastrointestinal system: (+) BS, non tender abdomen, peg in place  Central nervous system: All 4 extremities contracted  Extremities: Contracted, +1 pitting edema Skin: pressure ulcers in sacrum, hip  Psychiatry: Unable to assess due to pt mental status   Data Reviewed: I have personally reviewed following labs and imaging studies  CBC:  Recent Labs Lab 05/29/16 1643 05/30/16 0242 05/31/16 0453 05/31/16 0930 06/02/16 1200 06/03/16 0310  WBC 23.1* 11.4* 10.1  --  17.7* 20.1*  NEUTROABS 19.8*  --  8.8*  --   --   --   HGB 8.8* 7.0* 6.1* 9.1* 9.1* 9.0*  HCT 28.5* 22.7* 20.6* 28.4* 29.0* 28.2*  MCV 101.4* 101.3* 101.0*  --  97.0 97.2  PLT 277 247 213  --  215 301   Basic Metabolic Panel:  Recent Labs Lab 05/29/16 1643 05/30/16 0242 05/31/16 0620 05/31/16 1513 06/01/16 0545 06/01/16 1700 06/02/16 0500 06/02/16 1200 06/03/16 0310  NA 136 138 143  --   --   --   --  145 143  K 5.3* 5.5* 4.1  --   --   --   --   3.3* 3.9  CL 99* 108 113*  --   --   --   --  114* 114*  CO2 '29 25 24  '$ --   --   --   --  25 26  GLUCOSE 143* 85 76  --   --   --   --  96 105*  BUN 43* 38* 39*  --   --   --   --  39* 35*  CREATININE 0.78 0.65 0.82  --   --   --   --  0.61 0.58*  CALCIUM 7.8* 7.4* 8.0*  --   --   --   --  7.9* 7.7*  MG  --  1.9  --  2.0 1.7 1.7 1.9  --   --   PHOS  --  3.8  --  4.7* 3.1 2.6 2.5  --   --    GFR: Estimated Creatinine Clearance: 96.8 mL/min (by C-G formula based on SCr of 0.58 mg/dL (L)). Liver Function Tests:  Recent Labs Lab 05/29/16 1643  AST 75*  ALT 62  ALKPHOS 214*  BILITOT 0.4  PROT 5.8*  ALBUMIN 1.4*   No results for input(s): LIPASE, AMYLASE in the last 168 hours. No results for input(s): AMMONIA in the last 168 hours. Coagulation Profile:  Recent Labs Lab 05/29/16 1608  INR 1.84   Cardiac Enzymes:  Recent Labs Lab 05/29/16 1608 05/29/16 2052 05/30/16 0125 05/30/16 0242  TROPONINI 0.12* 0.07* 0.09* 0.07*   BNP (last 3 results) No results for input(s): PROBNP in the last 8760 hours. HbA1C: No results for input(s): HGBA1C in the last 72 hours. CBG:  Recent Labs Lab 06/03/16 1621 06/03/16 1958 06/03/16 2303 06/04/16 0308 06/04/16 0804  GLUCAP 122* 106* 95 106* 123*   Lipid Profile: No results for input(s): CHOL, HDL, LDLCALC, TRIG, CHOLHDL, LDLDIRECT in the last 72 hours. Thyroid Function Tests: No results for input(s): TSH, T4TOTAL, FREET4, T3FREE, THYROIDAB in the last 72 hours. Anemia Panel: No results for input(s): VITAMINB12, FOLATE, FERRITIN, TIBC, IRON, RETICCTPCT in the last 72 hours. Urine analysis:    Component Value Date/Time   COLORURINE YELLOW 05/29/2016 1215   APPEARANCEUR TURBID (A) 05/29/2016 1215   LABSPEC 1.022 05/29/2016 1215   PHURINE 7.0 05/29/2016 1215   GLUCOSEU NEGATIVE 05/29/2016 1215   HGBUR NEGATIVE 05/29/2016 1215   BILIRUBINUR NEGATIVE 05/29/2016 1215   KETONESUR NEGATIVE 05/29/2016 1215   PROTEINUR NEGATIVE  05/29/2016 1215   NITRITE NEGATIVE 05/29/2016 1215   LEUKOCYTESUR LARGE (A) 05/29/2016 1215   Sepsis Labs: '@LABRCNTIP'$ (procalcitonin:4,lacticidven:4)   Blood Culture (routine x 2)     Status: None (Preliminary result)   Collection Time: 05/29/16 10:15 AM  Result Value Ref Range Status   Specimen Description BLOOD RIGHT ANTECUBITAL  Final   Special Requests BOTTLES DRAWN AEROBIC AND ANAEROBIC 5CC  Final   Culture NO GROWTH 2 DAYS  Final   Report Status PENDING  Incomplete  Blood Culture (routine x 2)     Status: None (Preliminary result)   Collection Time: 05/29/16 10:25 AM  Result Value Ref Range Status   Specimen Description BLOOD RIGHT HAND  Final   Special Requests BOTTLES DRAWN AEROBIC AND ANAEROBIC 5CC  Final   Culture NO GROWTH 2 DAYS  Final   Report Status PENDING  Incomplete  Urine culture     Status: Abnormal   Collection Time: 05/29/16 12:15 PM  Result Value Ref Range Status   Specimen Description URINE, RANDOM  Final   Special Requests NONE  Final   Culture MULTIPLE SPECIES PRESENT, SUGGEST RECOLLECTION (A)  Final   Report Status 05/30/2016 FINAL  Final  MRSA PCR Screening     Status: None   Collection Time: 05/30/16  2:52 AM  Result Value Ref Range Status   MRSA by PCR NEGATIVE NEGATIVE Final     Radiology Studies: Dg Chest Port 1 View Result Date: 05/29/2016 Persistent posterior layering pleural effusions bilaterally with bibasilar consolidation/atelectasis. Stable tracheostomy No large pneumothorax by portable radiography.  Overall stable exam. Electronically Signed  By: Eugenie Filler M.D.   On: 05/29/2016 19:16   Dg Chest Port 1 View Result Date: 05/29/2016 1. Bilateral pleural effusion with pneumonia or atelectasis. 2. Artifact or nonspecific calcification over the right chest. 3. 1 cm nodule over the left base, likely nipple shadow. Attention on follow-up. Electronically Signed   By: Monte Fantasia M.D.   On: 05/29/2016 10:55      Scheduled Meds: .  amiodarone  100 mg Oral Daily  . aspirin  81 mg Per Tube Daily  . chlorhexidine gluconate (MEDLINE KIT)  15 mL Mouth Rinse BID  . famotidine  20 mg Per Tube BID  . heparin  5,000 Units Subcutaneous Q8H  . insulin aspart  0-9 Units Subcutaneous Q4H  . lacosamide  100 mg Oral BID  . levETIRAcetam  500 mg Per Tube BID  . linezolid (ZYVOX) IV  600 mg Intravenous Q12H  . mouth rinse  15 mL Mouth Rinse QID  . meropenem (MERREM) IV  1 g Intravenous Q8H  . midodrine  10 mg Oral TID WC   Continuous Infusions: . sodium chloride 10 mL/hr at 05/31/16 2200  . feeding supplement (VITAL AF 1.2 CAL) 1,000 mL (06/04/16 0354)     LOS: 6 days   Chipper Oman, MD [facility] Pager [phone]  If 7PM-7AM, please contact night-coverage [email or web address] Password [facility] 06/04/2016, 9:18 AM

## 2016-06-04 NOTE — Progress Notes (Signed)
RT called patient room due to patient having increased work of breathing.  Upon arrival, patient was noted to be using accessory muscles.  Patient was being suctioned by RN on arrival.  Placed patient back on ventilator, on full support.   No complications noted.  Will continue to monitor.

## 2016-06-04 NOTE — Consult Note (Signed)
[facility] for Infectious Disease  Date of Admission:  05/29/2016  Date of Consult:  06/04/2016  Reason for Consult: leukpcytosis Referring Physician: Patrecia Pour  Impression/Recommendation Septic Shock HCAP UTI- proteus  Acute on Chronic respiratory failure   Await repeat BC and UC Check sputum culture Check stool for C diff Enteric precautions CXR and KUB  Discontinue linezolid  Trend WBC, monitor for fever   Wide DDx- Could be HIA (VAP, C diff, line infection) or decubitus, PIC  Thank you so much for this interesting consult,   (pager) [phone] [email or web address]  Dean Graham is an 55 y.o. male.  HPI: PMH of TBI as a child, functional quadriplegia, Afib on amio, chronic respiratory failure (required tracheostomy 2-3 years ago and vent dependent since), GERD, DM type 2, neurogenic bladder with chronic indwelling foley, anemia of chronic disease, peg tube dependent, bed-ridden, contracted, and known sacral and bilateral hip pressure ulcers.    He was admitted on 9-19 from  [place]  with increased work of breathing, fever (100.7), hypotension, lethargy and WBC 23.  Chest xray showed bilateral pleural effusions with pneumonia or atelectasis. Treated with ~4L and remained hypotensive with a MAP around 65 (normally on midodrine). He was additionally made a DNR on admission.  He was placed on aztreonam and linezolid.  His urinalysis showed some bacteria with alkaline pH of 7 and triple phosphate crystals suggestive of proteus infection.  Since admission, his blood and urine cultures have been negative and he has remained afebrile.  WBC down to 10.1 on 9/21 but with steadily rising since WBC, today 23.5.  Respiratory requirements have been stable.   MSRA PCR 9/20 >> negative UC 9/19 >> multiple species UC 9/21 >>negative BCx 2 9/19 >>negative UC 9/25 >> BCx 2 9/25 >>  Linezolid 9/19 >> Meropenem 9/20 >> Aztreonam 9/19 >> 9/19 Levaquin 9/19  >>9/19   Right midline PICC 9/19 >>  Past Medical History:  Diagnosis Date  . Atrial fibrillation ([place])   . Chronic respiratory failure ([place])   . Diabetes mellitus without complication ([place])   . Diastolic heart failure ([place])   . Dysphagia   . GERD (gastroesophageal reflux disease)   . Quadriplegia ([place])     No past surgical history on file.   Allergies  Allergen Reactions  . Ativan [Lorazepam]   . Azithromycin   . Ceftriaxone   . Vancomycin     Medications:  Scheduled: . amiodarone  100 mg Oral Daily  . aspirin  81 mg Per Tube Daily  . chlorhexidine gluconate (MEDLINE KIT)  15 mL Mouth Rinse BID  . famotidine  20 mg Per Tube BID  . heparin  5,000 Units Subcutaneous Q8H  . insulin aspart  0-9 Units Subcutaneous Q4H  . lacosamide  100 mg Oral BID  . levETIRAcetam  500 mg Per Tube BID  . mouth rinse  15 mL Mouth Rinse QID  . meropenem (MERREM) IV  1 g Intravenous Q8H  . midodrine  10 mg Oral TID WC    Abtx:  Anti-infectives    Start     Dose/Rate Route Frequency Ordered Stop   05/30/16 1800  meropenem (MERREM) 1 g in sodium chloride 0.9 % 100 mL IVPB     1 g 200 mL/hr over 30 Minutes Intravenous Every 8 hours 05/30/16 1327     05/30/16 1200  levofloxacin (LEVAQUIN) IVPB 750 mg  Status:  Discontinued     750 mg 100 mL/hr over 90 Minutes Intravenous Every  24 hours 05/29/16 1211 05/29/16 1705   05/30/16 0300  imipenem-cilastatin (PRIMAXIN) 500 mg in sodium chloride 0.9 % 100 mL IVPB  Status:  Discontinued     500 mg 200 mL/hr over 30 Minutes Intravenous Every 8 hours 05/29/16 1943 05/30/16 1327   05/29/16 2100  aztreonam (AZACTAM) 1 g in dextrose 5 % 50 mL IVPB  Status:  Discontinued     1 g 100 mL/hr over 30 Minutes Intravenous Every 8 hours 05/29/16 1211 05/29/16 1518   05/29/16 1800  imipenem-cilastatin (PRIMAXIN) 500 mg in sodium chloride 0.9 % 100 mL IVPB  Status:  Discontinued     500 mg 200 mL/hr over 30 Minutes Intravenous Every 6 hours 05/29/16 1721  05/29/16 1943   05/29/16 1400  linezolid (ZYVOX) IVPB 600 mg     600 mg 300 mL/hr over 60 Minutes Intravenous Every 12 hours 05/29/16 1320     05/29/16 1045  levofloxacin (LEVAQUIN) IVPB 750 mg     750 mg 100 mL/hr over 90 Minutes Intravenous  Once 05/29/16 1031 05/29/16 1235   05/29/16 1045  aztreonam (AZACTAM) 2 g in dextrose 5 % 50 mL IVPB     2 g 100 mL/hr over 30 Minutes Intravenous  Once 05/29/16 1031 05/29/16 1330      Total days of antibiotics: 5          Social History:  has no tobacco, alcohol, and drug history on file.  No family history on file.  Unable to obtain from patient.   Blood pressure 108/84, pulse (!) 110, temperature 98 F (36.7 C), temperature source Axillary, resp. rate 20, height 5' 10" (1.778 m), weight 67.2 kg (148 lb 2.4 oz), SpO2 100 %. General appearance: unresponsive male on MV Eyes: scleral edema, disconjugate gaze.  Neck: trach present with purulent secretions Lungs: coarse with scattered rhonchi, diminished in bases, nonlabored on MV Heart: tachycardic Abdomen: normal bowel sounds, PEG tube in place Extremities: contractures of extremities, with sacral and hip ulcers with dsgs intact Skin: Skin color, texture, turgor normal. No rashes or lesions or generalized +3 edema Neurologic: Mental status: unresponsive L hip wound is clean.    Results for orders placed or performed during the hospital encounter of 05/29/16 (from the past 48 hour(s))  Glucose, capillary     Status: Abnormal   Collection Time: 06/02/16  8:21 PM  Result Value Ref Range   Glucose-Capillary 113 (H) 65 - 99 mg/dL   Comment 1 Notify RN   Glucose, capillary     Status: Abnormal   Collection Time: 06/02/16 11:22 PM  Result Value Ref Range   Glucose-Capillary 111 (H) 65 - 99 mg/dL   Comment 1 Notify RN   CBC     Status: Abnormal   Collection Time: 06/03/16  3:10 AM  Result Value Ref Range   WBC 20.1 (H) 4.0 - 10.5 K/uL   RBC 2.90 (L) 4.22 - 5.81 MIL/uL   Hemoglobin  9.0 (L) 13.0 - 17.0 g/dL   HCT 28.2 (L) 39.0 - 52.0 %   MCV 97.2 78.0 - 100.0 fL   MCH 31.0 26.0 - 34.0 pg   MCHC 31.9 30.0 - 36.0 g/dL   RDW 16.8 (H) 11.5 - 15.5 %   Platelets 210 150 - 400 K/uL  Basic metabolic panel     Status: Abnormal   Collection Time: 06/03/16  3:10 AM  Result Value Ref Range   Sodium 143 135 - 145 mmol/L   Potassium 3.9 3.5 - 5.1  mmol/L   Chloride 114 (H) 101 - 111 mmol/L   CO2 26 22 - 32 mmol/L   Glucose, Bld 105 (H) 65 - 99 mg/dL   BUN 35 (H) 6 - 20 mg/dL   Creatinine, Ser 0.58 (L) 0.61 - 1.24 mg/dL   Calcium 7.7 (L) 8.9 - 10.3 mg/dL   GFR calc non Af Amer >60 >60 mL/min   GFR calc Af Amer >60 >60 mL/min    Comment: (NOTE) The eGFR has been calculated using the CKD EPI equation. This calculation has not been validated in all clinical situations. eGFR's persistently <60 mL/min signify possible Chronic Kidney Disease.    Anion gap 3 (L) 5 - 15  Glucose, capillary     Status: Abnormal   Collection Time: 06/03/16  3:54 AM  Result Value Ref Range   Glucose-Capillary 107 (H) 65 - 99 mg/dL  Glucose, capillary     Status: Abnormal   Collection Time: 06/03/16  7:27 AM  Result Value Ref Range   Glucose-Capillary 118 (H) 65 - 99 mg/dL  Glucose, capillary     Status: None   Collection Time: 06/03/16 11:51 AM  Result Value Ref Range   Glucose-Capillary 99 65 - 99 mg/dL  Glucose, capillary     Status: Abnormal   Collection Time: 06/03/16  4:21 PM  Result Value Ref Range   Glucose-Capillary 122 (H) 65 - 99 mg/dL  Glucose, capillary     Status: Abnormal   Collection Time: 06/03/16  7:58 PM  Result Value Ref Range   Glucose-Capillary 106 (H) 65 - 99 mg/dL   Comment 1 Notify RN   Glucose, capillary     Status: None   Collection Time: 06/03/16 11:03 PM  Result Value Ref Range   Glucose-Capillary 95 65 - 99 mg/dL   Comment 1 Notify RN   Glucose, capillary     Status: Abnormal   Collection Time: 06/04/16  3:08 AM  Result Value Ref Range    Glucose-Capillary 106 (H) 65 - 99 mg/dL   Comment 1 Notify RN   Glucose, capillary     Status: Abnormal   Collection Time: 06/04/16  8:04 AM  Result Value Ref Range   Glucose-Capillary 123 (H) 65 - 99 mg/dL  CBC with Differential/Platelet     Status: Abnormal   Collection Time: 06/04/16  9:30 AM  Result Value Ref Range   WBC 23.5 (H) 4.0 - 10.5 K/uL   RBC 2.89 (L) 4.22 - 5.81 MIL/uL   Hemoglobin 8.9 (L) 13.0 - 17.0 g/dL   HCT 28.3 (L) 39.0 - 52.0 %   MCV 97.9 78.0 - 100.0 fL   MCH 30.8 26.0 - 34.0 pg   MCHC 31.4 30.0 - 36.0 g/dL   RDW 16.7 (H) 11.5 - 15.5 %   Platelets 178 150 - 400 K/uL   Neutrophils Relative % 88 %   Neutro Abs 20.5 (H) 1.7 - 7.7 K/uL   Lymphocytes Relative 9 %   Lymphs Abs 2.2 0.7 - 4.0 K/uL   Monocytes Relative 3 %   Monocytes Absolute 0.7 0.1 - 1.0 K/uL   Eosinophils Relative 0 %   Eosinophils Absolute 0.1 0.0 - 0.7 K/uL   Basophils Relative 0 %   Basophils Absolute 0.0 0.0 - 0.1 K/uL  Glucose, capillary     Status: None   Collection Time: 06/04/16 11:55 AM  Result Value Ref Range   Glucose-Capillary 89 65 - 99 mg/dL  Glucose, capillary     Status: Abnormal  Collection Time: 06/04/16  4:22 PM  Result Value Ref Range   Glucose-Capillary 113 (H) 65 - 99 mg/dL      Component Value Date/Time   SDES URINE, CATHETERIZED 05/31/2016 1520   SPECREQUEST NONE 05/31/2016 1520   CULT NO GROWTH 05/31/2016 1520   REPTSTATUS 06/01/2016 FINAL 05/31/2016 1520   No results found. Recent Results (from the past 240 hour(s))  Blood Culture (routine x 2)     Status: None   Collection Time: 05/29/16 10:15 AM  Result Value Ref Range Status   Specimen Description BLOOD RIGHT ANTECUBITAL  Final   Special Requests BOTTLES DRAWN AEROBIC AND ANAEROBIC 5CC  Final   Culture NO GROWTH 5 DAYS  Final   Report Status 06/03/2016 FINAL  Final  Blood Culture (routine x 2)     Status: None   Collection Time: 05/29/16 10:25 AM  Result Value Ref Range Status   Specimen  Description BLOOD RIGHT HAND  Final   Special Requests BOTTLES DRAWN AEROBIC AND ANAEROBIC 5CC  Final   Culture NO GROWTH 5 DAYS  Final   Report Status 06/03/2016 FINAL  Final  Urine culture     Status: Abnormal   Collection Time: 05/29/16 12:15 PM  Result Value Ref Range Status   Specimen Description URINE, RANDOM  Final   Special Requests NONE  Final   Culture MULTIPLE SPECIES PRESENT, SUGGEST RECOLLECTION (A)  Final   Report Status 05/30/2016 FINAL  Final  MRSA PCR Screening     Status: None   Collection Time: 05/30/16  2:52 AM  Result Value Ref Range Status   MRSA by PCR NEGATIVE NEGATIVE Final    Comment:        The GeneXpert MRSA Assay (FDA approved for NASAL specimens only), is one component of a comprehensive MRSA colonization surveillance program. It is not intended to diagnose MRSA infection nor to guide or monitor treatment for MRSA infections.   Culture, Urine     Status: None   Collection Time: 05/31/16  3:20 PM  Result Value Ref Range Status   Specimen Description URINE, CATHETERIZED  Final   Special Requests NONE  Final   Culture NO GROWTH  Final   Report Status 06/01/2016 FINAL  Final      06/04/2016, 4:53 PM     LOS: 6 days    Records and images were personally review

## 2016-06-04 NOTE — Care Management Important Message (Signed)
Important Message  Patient Details  Name: Giankarlo Shisler MRN: [id number] Date of Birth: 08-05-1961   Medicare Important Message Given:  Yes    Kyla Balzarine 06/04/2016, 10:42 AM

## 2016-06-04 NOTE — Progress Notes (Signed)
Sputum collected and sent to LAB per RRT.

## 2016-06-04 NOTE — Plan of Care (Signed)
Problem: Safety: Goal: Ability to remain free from injury will improve Outcome: Progressing Pt remained free from injury

## 2016-06-04 NOTE — Progress Notes (Signed)
CSW spoke with patient's brother, Minerva Areolaric. Minerva Areolaric would still [facility] [place], but CSW still not sure patient qualifies. CSW will consult with RNCM. No other [facility] close to family, so patient will either have to discharge back to [facility] or home.  Osborne Cascoadia Keyia Moretto LCSWA [phone]

## 2016-06-04 NOTE — Progress Notes (Addendum)
Daily Progress Note   Patient Name: Dean Graham       Date: 06/04/2016 DOB: Nov 05, 1960  Age: 55 y.o. MRN#: [id number] Attending Physician: Doreatha Lew, MD Primary Care Physician: Verneita Griffes, MD Admit Date: 05/29/2016  Reason for Consultation/Follow-up: Establishing goals of care  Subjective: Patient is non verbal and essentially non responsive today.  I spoke with his brother [name] to update him.  We discussed the patient's rising white count (despite antibiotics), the respiratory distress that occurred earlier today that caused him to be placed back on the vent, and the decreased responsiveness - no longer following commands today.  Length of Stay: 6  Current Medications: Scheduled Meds:  . amiodarone  100 mg Oral Daily  . aspirin  81 mg Per Tube Daily  . chlorhexidine gluconate (MEDLINE KIT)  15 mL Mouth Rinse BID  . famotidine  20 mg Per Tube BID  . heparin  5,000 Units Subcutaneous Q8H  . insulin aspart  0-9 Units Subcutaneous Q4H  . lacosamide  100 mg Oral BID  . levETIRAcetam  500 mg Per Tube BID  . linezolid (ZYVOX) IV  600 mg Intravenous Q12H  . mouth rinse  15 mL Mouth Rinse QID  . meropenem (MERREM) IV  1 g Intravenous Q8H  . midodrine  10 mg Oral TID WC    Continuous Infusions: . sodium chloride 10 mL/hr at 05/31/16 2200  . feeding supplement (VITAL AF 1.2 CAL) 1,000 mL (06/04/16 0354)    PRN Meds: sodium chloride flush  Physical Exam         Non responsive 55 yo male with trach (on vent), Peg (NPO), LE contractures CV rrr Resp Rals and course breath sounds. Abdomen PEG in place.  Erythema in folds between contracted legs and abdomen  Vital Signs: BP 108/84 (BP Location: Left Arm)   Pulse (!) 110   Temp 98 F (36.7 C) (Axillary)   Resp 20   Ht  5' 10" (1.778 m)   Wt 67.2 kg (148 lb 2.4 oz)   SpO2 100%   BMI 21.26 kg/m  SpO2: SpO2: 100 % O2 Device: O2 Device: Ventilator O2 Flow Rate: O2 Flow Rate (L/min): 5 L/min  Intake/output summary:  Intake/Output Summary (Last 24 hours) at 06/04/16 1701 Last data filed at 06/04/16 1059  Gross per 24 hour  Intake              150 ml  Output              800 ml  Net             -650 ml   LBM: Last BM Date: 06/04/16 Baseline Weight: Weight: 45.4 kg (100 lb) Most recent weight: Weight: 67.2 kg (148 lb 2.4 oz)       Palliative Assessment/Data:    Flowsheet Rows   Flowsheet Row Most Recent Value  Intake Tab  Referral Department  Hospitalist  Unit at Time of Referral  Intermediate Care Unit  Palliative Care Primary Diagnosis  Neurology  Date Notified  05/31/16  Palliative Care Type  New Palliative care  Reason for referral  Clarify Goals of Care  Date of Admission  05/29/16  Date first seen by Palliative Care  06/01/16  # of days Palliative referral response time  1 Day(s)  # of days IP prior to Palliative referral  2  Clinical Assessment  Palliative Performance Scale Score  10%  Psychosocial & Spiritual Assessment  Palliative Care Outcomes  Patient/Family meeting held?  Yes  Who was at the meeting?  Brother Social research officer, government and Mother  Palliative Care Outcomes  Clarified goals of care  Palliative Care follow-up planned  Yes, Facility      Patient Active Problem List   Diagnosis Date Noted  . Encounter for wound care   . Protein-calorie malnutrition, severe ([place])   . Palliative care encounter   . Goals of care, counseling/discussion   . Pressure injury of skin 05/31/2016  . Quadriplegia ([place]) 05/31/2016  . Atrial fibrillation ([place]) 05/31/2016  . Diabetes mellitus without complication ([place]) 97/35/3299  . Chronic respiratory failure ([place]) 05/31/2016  . Pressure ulcer stage IV ([place]) 05/31/2016  . Septic shock ([place]) 05/29/2016  . HCAP (healthcare-associated pneumonia)      Palliative Care Assessment & Plan   Patient Profile: 55 y.o. male  with past medical history of traumatic brain injury, quadriplegia, dysphagia / peg dependent, atrial fibrillation, diabetes mellitus, diastolic heart failure and chronic respiratory failure/trach dependent who was admitted on 05/29/2016 with severe sepsis thought to be secondary to HCAP as well as urinary source.  He was admitted to the ICU and treated with broad spectrum antibiotics and short term pressors. He is currently vent dependent and found to have multiple pressure wounds including a stage 4 left hip wound and a stage 3 right hip wound.  He is bed ridden.  His albumin is currently 1.4.  His vital signs have stabilized with treatment and Palliative Medicine has been asked to see him.  Assessment: Patient seems to be declining even on maximal medical therapy.  In our family discussion his brother and mother conveyed that as the patient had been disabled since age 26 he had a very different sense of normal and quality of life.  I told [name] today that even taking into account Germaine's different normal - I felt like he was declining and may pass in the coming weeks / months.  Recommendations/Plan:  Decreased responsiveness, decreased respiratory status in a patient who's family still wants full scope treatment.    Will check CXR, repeat urine and blood cultures.  Agree with ID consult given continued decline on meropenem and linezolid  Brother [name] requests an update from social work.  He is eager to place his brother closer to home [facility]) if possible at discharge.  Goals of Care  and Additional Recommendations:  Limitations on Scope of Treatment: Full Scope Treatment  Code Status: DNR / DNI  Prognosis:   < 3 months perhaps significantly less with continued aspiration and infections.  Discharge Planning:  [place] for rehab with Palliative care service follow-up  Care plan was discussed with  Brother [name].  Thank you for allowing the Palliative Medicine Team to assist in the care of this patient.   Time In: 4:30 Time Out: 5:05 Total Time 35 min Prolonged Time Billed no      Greater than 50%  of this time was spent counseling and coordinating care related to the above assessment and plan.  Melton Alar, PA-C  Please contact Palliative Medicine Team phone at [phone] for questions and concerns.

## 2016-06-04 NOTE — Progress Notes (Signed)
Pt exhibiting increased work of breathing with use of accessory muscles, diaphoretic and tachypnic. RT notified to place patient back on vent since he is not tolerating trach collar too well. Pt suctioned. Will continue to monitor

## 2016-06-04 NOTE — Progress Notes (Signed)
Omni flex placed on distal end of IC hub. Pt is stable no distress noted.

## 2016-06-05 ENCOUNTER — Inpatient Hospital Stay (HOSPITAL_COMMUNITY): Payer: Medicare Other

## 2016-06-05 DIAGNOSIS — G9340 Encephalopathy, unspecified: Secondary | ICD-10-CM | POA: Diagnosis not present

## 2016-06-05 DIAGNOSIS — A419 Sepsis, unspecified organism: Secondary | ICD-10-CM | POA: Diagnosis not present

## 2016-06-05 DIAGNOSIS — E8770 Fluid overload, unspecified: Secondary | ICD-10-CM | POA: Diagnosis not present

## 2016-06-05 DIAGNOSIS — Z515 Encounter for palliative care: Secondary | ICD-10-CM | POA: Diagnosis not present

## 2016-06-05 DIAGNOSIS — R6521 Severe sepsis with septic shock: Secondary | ICD-10-CM | POA: Diagnosis not present

## 2016-06-05 DIAGNOSIS — F0781 Postconcussional syndrome: Secondary | ICD-10-CM

## 2016-06-05 DIAGNOSIS — Z7189 Other specified counseling: Secondary | ICD-10-CM | POA: Diagnosis not present

## 2016-06-05 DIAGNOSIS — E876 Hypokalemia: Secondary | ICD-10-CM | POA: Diagnosis not present

## 2016-06-05 DIAGNOSIS — R1310 Dysphagia, unspecified: Secondary | ICD-10-CM

## 2016-06-05 DIAGNOSIS — L89154 Pressure ulcer of sacral region, stage 4: Secondary | ICD-10-CM

## 2016-06-05 DIAGNOSIS — L89159 Pressure ulcer of sacral region, unspecified stage: Secondary | ICD-10-CM

## 2016-06-05 DIAGNOSIS — Y95 Nosocomial condition: Secondary | ICD-10-CM | POA: Diagnosis not present

## 2016-06-05 DIAGNOSIS — J9621 Acute and chronic respiratory failure with hypoxia: Secondary | ICD-10-CM | POA: Diagnosis not present

## 2016-06-05 DIAGNOSIS — N390 Urinary tract infection, site not specified: Secondary | ICD-10-CM

## 2016-06-05 DIAGNOSIS — J189 Pneumonia, unspecified organism: Secondary | ICD-10-CM | POA: Diagnosis not present

## 2016-06-05 DIAGNOSIS — R509 Fever, unspecified: Secondary | ICD-10-CM | POA: Diagnosis not present

## 2016-06-05 LAB — COMPREHENSIVE METABOLIC PANEL
ALBUMIN: 1.2 g/dL — AB (ref 3.5–5.0)
ALK PHOS: 165 U/L — AB (ref 38–126)
ALT: 60 U/L (ref 17–63)
AST: 66 U/L — AB (ref 15–41)
Anion gap: 6 (ref 5–15)
BILIRUBIN TOTAL: 0.8 mg/dL (ref 0.3–1.2)
BUN: 33 mg/dL — AB (ref 6–20)
CALCIUM: 7.7 mg/dL — AB (ref 8.9–10.3)
CO2: 24 mmol/L (ref 22–32)
CREATININE: 0.52 mg/dL — AB (ref 0.61–1.24)
Chloride: 114 mmol/L — ABNORMAL HIGH (ref 101–111)
GFR calc Af Amer: >60 mL/min (ref >60)
GFR calc non Af Amer: >60 mL/min (ref >60)
GLUCOSE: 113 mg/dL — AB (ref 65–99)
Potassium: 4 mmol/L (ref 3.5–5.1)
Sodium: 144 mmol/L (ref 135–145)
TOTAL PROTEIN: 4.7 g/dL — AB (ref 6.5–8.1)

## 2016-06-05 LAB — CBC WITH DIFFERENTIAL/PLATELET
BASOS ABS: 0 10*3/uL (ref 0.0–0.1)
BASOS PCT: 0 %
EOS PCT: 0 %
Eosinophils Absolute: 0.1 10*3/uL (ref 0.0–0.7)
HCT: 28 % — ABNORMAL LOW (ref 39.0–52.0)
Hemoglobin: 8.8 g/dL — ABNORMAL LOW (ref 13.0–17.0)
LYMPHS PCT: 8 %
Lymphs Abs: 1.8 10*3/uL (ref 0.7–4.0)
MCH: 30.8 pg (ref 26.0–34.0)
MCHC: 31.4 g/dL (ref 30.0–36.0)
MCV: 97.9 fL (ref 78.0–100.0)
MONO ABS: 0.8 10*3/uL (ref 0.1–1.0)
Monocytes Relative: 4 %
Neutro Abs: 19.7 10*3/uL — ABNORMAL HIGH (ref 1.7–7.7)
Neutrophils Relative %: 88 %
PLATELETS: 196 10*3/uL (ref 150–400)
RBC: 2.86 MIL/uL — AB (ref 4.22–5.81)
RDW: 16.8 % — AB (ref 11.5–15.5)
WBC: 22.4 10*3/uL — AB (ref 4.0–10.5)

## 2016-06-05 LAB — LACTIC ACID, PLASMA
Lactic Acid, Venous: 2.7 mmol/L (ref 0.5–1.9)
Lactic Acid, Venous: 2.8 mmol/L (ref 0.5–1.9)

## 2016-06-05 LAB — GLUCOSE, CAPILLARY
GLUCOSE-CAPILLARY: 95 mg/dL (ref 65–99)
GLUCOSE-CAPILLARY: 108 mg/dL — AB (ref 65–99)
Glucose-Capillary: 105 mg/dL — ABNORMAL HIGH (ref 65–99)
Glucose-Capillary: 106 mg/dL — ABNORMAL HIGH (ref 65–99)
Glucose-Capillary: 111 mg/dL — ABNORMAL HIGH (ref 65–99)
Glucose-Capillary: 119 mg/dL — ABNORMAL HIGH (ref 65–99)
Glucose-Capillary: 120 mg/dL — ABNORMAL HIGH (ref 65–99)

## 2016-06-05 LAB — MAGNESIUM: Magnesium: 1.6 mg/dL — ABNORMAL LOW (ref 1.7–2.4)

## 2016-06-05 MED ORDER — ALBUMIN HUMAN 25 % IV SOLN
50.0000 g | Freq: Once | INTRAVENOUS | Status: AC
  Administered 2016-06-05: 50 g via INTRAVENOUS
  Filled 2016-06-05: qty 200

## 2016-06-05 MED ORDER — SODIUM CHLORIDE 0.9 % IV BOLUS (SEPSIS)
500.0000 mL | Freq: Once | INTRAVENOUS | Status: DC
  Administered 2016-06-05: 500 mL via INTRAVENOUS

## 2016-06-05 MED ORDER — DEXTROSE 5 % IV SOLN
3.0000 g | Freq: Once | INTRAVENOUS | Status: AC
  Administered 2016-06-05: 3 g via INTRAVENOUS
  Filled 2016-06-05: qty 6

## 2016-06-05 MED ORDER — SODIUM CHLORIDE 0.9 % IV BOLUS (SEPSIS)
1000.0000 mL | Freq: Once | INTRAVENOUS | Status: AC
  Administered 2016-06-05: 1000 mL via INTRAVENOUS

## 2016-06-05 MED ORDER — FUROSEMIDE 10 MG/ML IJ SOLN
20.0000 mg | Freq: Once | INTRAMUSCULAR | Status: AC
  Administered 2016-06-05: 20 mg via INTRAVENOUS
  Filled 2016-06-05: qty 2

## 2016-06-05 NOTE — Progress Notes (Addendum)
Daily Progress Note   Patient Name: Dean Graham       Date: 06/05/2016 DOB: Nov 11, 1960  Age: 55 y.o. MRN#: [id number] Attending Physician: Allie Bossier, MD Primary Care Physician: Verneita Griffes, MD Admit Date: 05/29/2016  Reason for Consultation/Follow-up: Establishing goals of care  Subjective: Patient is nonverbal and essentially non responsive.  His eyelids move when you speak with him.  He is still on the vent continuously, his pulse rate has climbed into the 120s, dark brown secretions have been suctioned from his trach, his white count remains elevated.  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 Infectious disease has joined the treatment team.  She agreed and feels that he is suffering.  She committed to speak with [name] (one of her other sons) today about what to do going forward.  I have been talking with her about comfort measures.  Per patient's brother yesterday - he would not be surprised if the patient died in the coming weeks / months.  Length of Stay: 7  Current Medications: Scheduled Meds:  . amiodarone  100 mg Oral Daily  . aspirin  81 mg Per Tube Daily  . chlorhexidine gluconate (MEDLINE KIT)  15 mL Mouth Rinse BID  . famotidine  20 mg Per Tube BID  . heparin  5,000 Units Subcutaneous Q8H  . insulin aspart  0-9 Units Subcutaneous Q4H  . lacosamide  100 mg Oral BID  . levETIRAcetam  500 mg Per Tube BID  . mouth rinse  15 mL Mouth Rinse QID  . meropenem (MERREM) IV  1 g Intravenous Q8H  . midodrine  10 mg Oral TID WC    Continuous Infusions: . sodium chloride 10 mL/hr at 05/31/16 2200  . feeding supplement (VITAL AF 1.2 CAL) 1,000 mL  (06/04/16 2035)    PRN Meds: sodium chloride flush  Physical Exam         Non responsive 55 yo male with trach (on vent), Peg (NPO), LE contractures CV rrr Resp Rals and course breath sounds. Abdomen PEG in place.  Erythema in folds between contracted legs and abdomen  Vital Signs: BP (!) 88/74   Pulse (!) 124   Temp 98.7 F (  37.1 C) (Oral)   Resp (!) 29   Ht '5\' 10"'$  (1.778 m)   Wt 69.5 kg (153 lb 3.5 oz)   SpO2 100%   BMI 21.98 kg/m  SpO2: SpO2: 100 % O2 Device: O2 Device: Ventilator O2 Flow Rate: O2 Flow Rate (L/min): 5 L/min  Intake/output summary:   Intake/Output Summary (Last 24 hours) at 06/05/16 2440 Last data filed at 06/05/16 0600  Gross per 24 hour  Intake             3355 ml  Output             1450 ml  Net             1905 ml   LBM: Last BM Date: 06/04/16 Baseline Weight: Weight: 45.4 kg (100 lb) Most recent weight: Weight: 69.5 kg (153 lb 3.5 oz)       Palliative Assessment/Data:    Flowsheet Rows   Flowsheet Row Most Recent Value  Intake Tab  Referral Department  Hospitalist  Unit at Time of Referral  Intermediate Care Unit  Palliative Care Primary Diagnosis  Neurology  Date Notified  05/31/16  Palliative Care Type  New Palliative care  Reason for referral  Clarify Goals of Care  Date of Admission  05/29/16  Date first seen by Palliative Care  06/01/16  # of days Palliative referral response time  1 Day(s)  # of days IP prior to Palliative referral  2  Clinical Assessment  Palliative Performance Scale Score  10%  Psychosocial & Spiritual Assessment  Palliative Care Outcomes  Patient/Family meeting held?  Yes  Who was at the meeting?  Brother Social research officer, government and Mother  Palliative Care Outcomes  Clarified goals of care  Palliative Care follow-up planned  Yes, Facility      Patient Active Problem List   Diagnosis Date Noted  . Encounter for wound care   . Protein-calorie malnutrition, severe ([place])   . Palliative care encounter   . Goals of  care, counseling/discussion   . Pressure injury of skin 05/31/2016  . Quadriplegia ([place]) 05/31/2016  . Atrial fibrillation ([place]) 05/31/2016  . Diabetes mellitus without complication ([place]) 07/07/2535  . Chronic respiratory failure ([place]) 05/31/2016  . Pressure ulcer stage IV ([place]) 05/31/2016  . Septic shock ([place]) 05/29/2016  . HCAP (healthcare-associated pneumonia)     Palliative Care Assessment & Plan   Patient Profile: 55 y.o. male  with past medical history of traumatic brain injury, quadriplegia, dysphagia / peg dependent, atrial fibrillation, diabetes mellitus, diastolic heart failure and chronic respiratory failure/trach dependent who was admitted on 05/29/2016 with severe sepsis thought to be secondary to HCAP as well as urinary source.  He was admitted to the ICU and treated with broad spectrum antibiotics and short term pressors. He is currently vent dependent and found to have multiple pressure wounds including a stage 4 left hip wound and a stage 3 right hip wound.  He is bed ridden.  His albumin is currently 1.4.  Assessment: Patient seems to be declining even on maximal medical therapy.  Increased dark brown secretions.  Remains on the vent full time.  Tachycardic, WBC still elevated.  Sputum, blood and urine cultures were obtained on 9/25.  CXR shows  Continued pleural effusions and bibasilar consolidation.  C-diff is negative.  Sputum shows moderate gram negative rods. .  Recommendations/Plan:  Decreased responsiveness, decreased respiratory status in a patient who's family still wants full scope treatment.    Will continue to work  with the family on moving to comfort measures.  Will continue to follow patient's medical evolution.    Goals of Care and Additional Recommendations:  Limitations on Scope of Treatment: Full Scope Treatment  Code Status: DNR / DNI  Prognosis:   < 6 weeks perhaps significantly less with continued aspiration and infections.  Discharge  Planning:  [place] for rehab with Palliative care service follow-up  Care plan was discussed with the patient's mother.  Thank you for allowing the Palliative Medicine Team to assist in the care of this patient.   Time In: 9:00 Time Out: 9:25 Total Time 25 min Prolonged Time Billed no      Greater than 50%  of this time was spent counseling and coordinating care related to the above assessment and plan.  Melton Alar, PA-C  Please contact Palliative Medicine Team phone at [phone] for questions and concerns.

## 2016-06-05 NOTE — Progress Notes (Signed)
Follow-up visit.Patient appeared to be sleeping, but today nurse said that may be his baseline - he could respond to voice -- and encouraged me to offer prayer. Spoke to patient and gently touched his arm, but received no response. Offered simple blessing. Chaplain will follow-up later.

## 2016-06-05 NOTE — Plan of Care (Signed)
Problem: Health Behavior/Discharge Planning: Goal: Ability to manage health-related needs will improve Outcome: Progressing Family has had conversations with palliative care to discuss patient's POC. Explained that patient does seem to be declining. [facility] [place] [facility] [place]. Explained that this could be a difficult move, but family will get more guidance from Palliative care and MD 9/27.  Problem: Skin Integrity: Goal: Risk for impaired skin integrity will decrease Outcome: Progressing Patient moved to air mattress with rotation to protect his skin which already has multiple areas of skin breakdown. Also has flexiseal in place d/t frequent loose stools and their damaging quality in presence of open sacral wounds. Patient also with MASD to groin..powder used to help dry up wet breakdown.  Problem: Nutrition: Goal: Adequate nutrition will be maintained Outcome: Progressing Patient still receiving tube feeds for nutrition.   Problem: Bowel/Gastric: Goal: Will not experience complications related to bowel motility Outcome: Progressing Patient has flexiseal for recurrent loose stooling. Tested negative for C Diff. Amount of stool monitored to assess for fluctuations.

## 2016-06-05 NOTE — Progress Notes (Signed)
SLP Cancellation Note  Patient Details Name: Dean Graham MRN: [id number] DOB: Feb 23, 1961   Cancelled treatment:       Reason Eval/Treat Not Completed: Other (comment). Given report of thick secretions and need for ventilation, will cancel orders for PMSV at this time. Please reorder when pt becomes medically ready.    Lorren Splawn, Riley Nearing 06/05/2016, 2:35 PM

## 2016-06-05 NOTE — Progress Notes (Signed)
PROGRESS NOTE    Dean Graham  QQP:[id number] DOB: May 25, 1961 DOA: 05/29/2016 PCP: Verneita Griffes, MD   Brief Narrative:  55 y.o.BM SNF resident PMHx TBI, Quadriplegia , Chronic Trach-dependent respiratory failure, PEG tube, Neurogenic bladder with Chronic foley, Atrial fibrillation , Diastolic CHF, Anemia of chronic disease,Diabetes mellitus Type 2 without complication  Who was sent from [place] 9/19 with fever, hypotension and increased work of breathing.  In ED, pt was febrile with T max of 100.7 F, hypotensive with BP of 72/60 without improvement after 4L IV fluids. He is white blood cell count was 23. Chest x-ray showed bilateral pleural effusions with pneumonia versus atelectasis. Patient was admitted to ICU for possible need for pressor support. Patient was transferred to stepdown unit 05/30/2016. On meropenem and Linezolid. Now with increasing white count   Subjective: 9/26  afebrile last 24   Assessment & Plan:   Principal Problem:   Septic shock ([place]) Active Problems:   HCAP (healthcare-associated pneumonia)   Pressure injury of skin   Quadriplegia (HCC)   Atrial fibrillation (HCC)   Diabetes mellitus without complication ([place])   Chronic respiratory failure (HCC)   Pressure ulcer stage IV ([place])   Encounter for wound care   Protein-calorie malnutrition, severe ([place])   Palliative care encounter   Goals of care, counseling/discussion   Chronic post traumatic encephalopathy   Acute encephalopathy   Hypokalemia   Hypomagnesemia   Urinary tract infection, site not specified   Acute on chronic respiratory failure with hypoxia (HCC)   Dysphagia   Decubitus ulcer of sacral area   Septic shock/HCAP/ UTI  -Pt with chronic indwelling Foley catheter  - blood cx negative. -Patient remains tachycardic and hypotensive with increasing white count. 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 - Due to septic shock - No significant changes in mental status over past 48 hours (baseline patient appears to be obtunded?)  Hyperkalemia / Hypokalemia  - Repeat potassium WNL  Hypomagnesemia -Magnesium goal> 2 -Magnesium IV 3 g  - Hypocalcemia -Corrected calcium= 10.1  Acute on Chronic respiratory failure with hypoxia, trach-dependent - Stable respiratory status   Chronic diastolic CHF -Echocardiogram pending - Holding lasix due to soft BP, 111/80  Chronic atrial fibrillation(CHADS vasc score 2) - On amiodarone and aspirin   Dysphagia, PEG tube-dependent / Severe protein calorie malnutrition  - Chronic, stable  - Continue tube feeds   Quadriplegia - Stable   Neurogenic bladder - Chronic, stable - Has Foley catheter in place  Anemia of chronic disease - Baseline hemoglobin around 8 - S/P 1 unit PRBC transfusion - Monitor CBC   Seizure disorder - Continue keppra, vimpat  Stage 4 sacral pressure ulcer, stage 3 right and left hip sacral ulcer - Appreciate wound care assessment - Stage 4Sacrum 10cm x 8cm x 0.2cm with 100% red gran tissue, mod amtdrainage green, mild odor - Healing stage 3 righthip 2.5cm x 2 cm 100 % red granulating, no drainage - Left hip Stage 3 ulcer 4.5cm x 7cm x 1cm, 100% red granulating, modamt green drainage, mild odor  -Left great toe full thickness 0.8cm  x 0.8cm 100% red gran tissue, no drainage or odor - L flank MARSI 0.5cm x 0.3cm pink and dry - L knee full thickness 2.5cm x 2cm red and moistno drainage or odor noted - L heel Stage 2 0.3cm x 0.3cm pink and moist no  drainage or odor - Per WOC, apply quacell for sacrum and left hip to absorb drainage and provide antimicrobial benifitsand allevyn foam to the others -Placed on a mattress and ensure on rotation at all times   Goals of care -Patient's family needs to have a realistic conversation with palliative care and CSW. Patient's family arrived~1800 requests and half patient moved to [facility] in [facility]. Since this would be a lateral move, move of convenience of the family. Family would be responsible for finding hospital which would except severely ill chronic vent patient. Doubt Medicare would pay for lateral move therefore patient's family would have to arrange transportation, and accepting physician. Per RN Stefanie A Viverito note palliative care will address this issue with family on 9/27     DVT prophylaxis: Subcutaneous heparin  Code Status: DO NOT RESUSCITATE Family Communication: None Disposition Plan: Await further palliative care recommendation   Consultants:  [name] ID Dr.Daniel Lily Kocher [facility] M    Procedures/Significant Events:  None   Cultures 9/19 blood right AC/hand negative 9/19 urine positive multiple species 9/20 MRSA by PCR negative 9/21 urine negative 9/25 blood left hand 2 NGTD 9/25 urine pending 9/25 tracheal aspirate positive GNR    Antimicrobials: Meropenem 9/20 >> Linezolid 9/19 >> 9/25 Aztreonam 9/19 >> 9/19 Levaquin 9/19 >>9/19   Devices None   LINES / TUBES:  Right midline PICC 9/19 >>    Continuous Infusions: . sodium chloride 10 mL/hr at 05/31/16 2200  . feeding supplement (VITAL AF 1.2 CAL) 1,000 mL (06/05/16 1626)     Objective: Vitals:   06/05/16 1900 06/05/16 1924 06/05/16 1958 06/05/16 2000  BP: (!) 79/64 (!) 85/67  (!) 83/54  Pulse: (!) 110 (!) 110    Resp: 19 18    Temp:  98.5 F (36.9 C)    TempSrc:  Oral    SpO2: 100% 100% 100%   Weight:      Height:        Intake/Output Summary (Last 24  hours) at 06/05/16 2013 Last data filed at 06/05/16 1927  Gross per 24 hour  Intake             4495 ml  Output             1300 ml  Net             3195 ml   Filed Weights   06/03/16 0401 06/04/16 0354 06/05/16 0446  Weight: 66.9 kg (147 lb 7.8 oz) 67.2 kg (148 lb 2.4 oz) 69.5 kg (153 lb 3.5 oz)    Examination:  General: A/O 0, positive chronic respiratory distress (on vent) Eyes: negative scleral hemorrhage, negative anisocoria, negative icterus, right eye swollen ENT: Negative Runny nose, negative gingival bleeding, Neck:  Negative scars, masses, torticollis, lymphadenopathy, JVD Lungs: coarse breath sounds diffusely, negative wheezes or crackles Cardiovascular: Regular rate and rhythm without murmur gallop or rub normal S1 and S2 Abdomen: negative abdominal pain, nondistended, positive soft, bowel sounds, no rebound, no ascites, no appreciable mass, PEG site covered and clean negative sign of infection Extremities: No significant cyanosis, clubbing. Plus bilateral lower extremity edema 2+ Skin: Multiple hip/sacral ulcers stage 3/4 (did not review today).  Psychiatric:  Unable to evaluate  Central nervous system:  Grimaces to painful stimuli: Unable to complete evaluation secondary to altered mental status (unsure of baseline)   .     Data Reviewed: Care during the described time interval was provided by me .  I have reviewed this patient's available data, including medical history, events of note, physical examination, and all test results as part of my evaluation. I have personally reviewed and interpreted all radiology studies.  CBC:  Recent Labs Lab 05/31/16 0453 05/31/16 0930 06/02/16 1200 06/03/16 0310 06/04/16 0930 06/05/16 0515  WBC 10.1  --  17.7* 20.1* 23.5* 22.4*  NEUTROABS 8.8*  --   --   --  20.5* 19.7*  HGB 6.1* 9.1* 9.1* 9.0* 8.9* 8.8*  HCT 20.6* 28.4* 29.0* 28.2* 28.3* 28.0*  MCV 101.0*  --  97.0 97.2 97.9 97.9  PLT 213  --  215 210 178 818   Basic  Metabolic Panel:  Recent Labs Lab 05/30/16 0242 05/31/16 0620 05/31/16 1513 06/01/16 0545 06/01/16 1700 06/02/16 0500 06/02/16 1200 06/03/16 0310 06/05/16 0910  NA 138 143  --   --   --   --  145 143 144  K 5.5* 4.1  --   --   --   --  3.3* 3.9 4.0  CL 108 113*  --   --   --   --  114* 114* 114*  CO2 25 24  --   --   --   --  '25 26 24  '$ GLUCOSE 85 76  --   --   --   --  96 105* 113*  BUN 38* 39*  --   --   --   --  39* 35* 33*  CREATININE 0.65 0.82  --   --   --   --  0.61 0.58* 0.52*  CALCIUM 7.4* 8.0*  --   --   --   --  7.9* 7.7* 7.7*  MG 1.9  --  2.0 1.7 1.7 1.9  --   --  1.6*  PHOS 3.8  --  4.7* 3.1 2.6 2.5  --   --   --    GFR: Estimated Creatinine Clearance: 100.1 mL/min (by C-G formula based on SCr of 0.52 mg/dL (L)). Liver Function Tests:  Recent Labs Lab 06/05/16 0910  AST 66*  ALT 60  ALKPHOS 165*  BILITOT 0.8  PROT 4.7*  ALBUMIN 1.2*   No results for input(s): LIPASE, AMYLASE in the last 168 hours. No results for input(s): AMMONIA in the last 168 hours. Coagulation Profile: No results for input(s): INR, PROTIME in the last 168 hours. Cardiac Enzymes:  Recent Labs Lab 05/29/16 2052 05/30/16 0125 05/30/16 0242  TROPONINI 0.07* 0.09* 0.07*   BNP (last 3 results) No results for input(s): PROBNP in the last 8760 hours. HbA1C: No results for input(s): HGBA1C in the last 72 hours. CBG:  Recent Labs Lab 06/05/16 0443 06/05/16 0806 06/05/16 1240 06/05/16 1652 06/05/16 1929  GLUCAP 106* 111* 119* 95 108*   Lipid Profile: No results for input(s): CHOL, HDL, LDLCALC, TRIG, CHOLHDL, LDLDIRECT in the last 72 hours. Thyroid Function Tests: No results for input(s): TSH, T4TOTAL, FREET4, T3FREE, THYROIDAB in the last 72 hours. Anemia Panel: No results for input(s): VITAMINB12, FOLATE, FERRITIN, TIBC, IRON, RETICCTPCT in the last 72 hours. Urine analysis:    Component Value Date/Time   COLORURINE YELLOW 06/04/2016 2034   APPEARANCEUR CLEAR  06/04/2016 2034   LABSPEC 1.027 06/04/2016  2034   PHURINE 6.0 06/04/2016 2034   GLUCOSEU NEGATIVE 06/04/2016 2034   HGBUR NEGATIVE 06/04/2016 2034   BILIRUBINUR NEGATIVE 06/04/2016 2034   KETONESUR NEGATIVE 06/04/2016 2034   PROTEINUR NEGATIVE 06/04/2016 2034   NITRITE NEGATIVE 06/04/2016 2034   LEUKOCYTESUR NEGATIVE 06/04/2016 2034   Sepsis Labs: @LABRCNTIP (procalcitonin:4,lacticidven:4)  ) Recent Results (from the past 240 hour(s))  Blood Culture (routine x 2)     Status: None   Collection Time: 05/29/16 10:15 AM  Result Value Ref Range Status   Specimen Description BLOOD RIGHT ANTECUBITAL  Final   Special Requests BOTTLES DRAWN AEROBIC AND ANAEROBIC 5CC  Final   Culture NO GROWTH 5 DAYS  Final   Report Status 06/03/2016 FINAL  Final  Blood Culture (routine x 2)     Status: None   Collection Time: 05/29/16 10:25 AM  Result Value Ref Range Status   Specimen Description BLOOD RIGHT HAND  Final   Special Requests BOTTLES DRAWN AEROBIC AND ANAEROBIC 5CC  Final   Culture NO GROWTH 5 DAYS  Final   Report Status 06/03/2016 FINAL  Final  Urine culture     Status: Abnormal   Collection Time: 05/29/16 12:15 PM  Result Value Ref Range Status   Specimen Description URINE, RANDOM  Final   Special Requests NONE  Final   Culture MULTIPLE SPECIES PRESENT, SUGGEST RECOLLECTION (A)  Final   Report Status 05/30/2016 FINAL  Final  MRSA PCR Screening     Status: None   Collection Time: 05/30/16  2:52 AM  Result Value Ref Range Status   MRSA by PCR NEGATIVE NEGATIVE Final    Comment:        The GeneXpert MRSA Assay (FDA approved for NASAL specimens only), is one component of a comprehensive MRSA colonization surveillance program. It is not intended to diagnose MRSA infection nor to guide or monitor treatment for MRSA infections.   Culture, Urine     Status: None   Collection Time: 05/31/16  3:20 PM  Result Value Ref Range Status   Specimen Description URINE, CATHETERIZED  Final    Special Requests NONE  Final   Culture NO GROWTH  Final   Report Status 06/01/2016 FINAL  Final  Culture, blood (routine x 2)     Status: None (Preliminary result)   Collection Time: 06/04/16  5:42 PM  Result Value Ref Range Status   Specimen Description BLOOD LEFT HAND  Final   Special Requests IN PEDIATRIC BOTTLE  3CC  Final   Culture NO GROWTH < 24 HOURS  Final   Report Status PENDING  Incomplete  Culture, blood (routine x 2)     Status: None (Preliminary result)   Collection Time: 06/04/16  5:50 PM  Result Value Ref Range Status   Specimen Description BLOOD LEFT HAND  Final   Special Requests BOTTLES DRAWN AEROBIC ONLY  5CC  Final   Culture NO GROWTH < 24 HOURS  Final   Report Status PENDING  Incomplete  C difficile quick scan w PCR reflex     Status: None   Collection Time: 06/04/16  6:30 PM  Result Value Ref Range Status   C Diff antigen NEGATIVE NEGATIVE Final   C Diff toxin NEGATIVE NEGATIVE Final   C Diff interpretation No C. difficile detected.  Final  Culture, respiratory (NON-Expectorated)     Status: None (Preliminary result)   Collection Time: 06/04/16 11:28 PM  Result Value Ref Range Status   Specimen Description TRACHEAL ASPIRATE  Final  Special Requests NONE  Final   Gram Stain   Final    RARE SQUAMOUS EPITHELIAL CELLS PRESENT FEW WBC PRESENT,BOTH PMN AND MONONUCLEAR MODERATE GRAM NEGATIVE RODS    Culture PENDING  Incomplete   Report Status PENDING  Incomplete         Radiology Studies: Dg Chest Port 1 View  Result Date: 06/05/2016 CLINICAL DATA:  Acute onset of vomiting an shortness of breath. Assess for pneumonia. Initial encounter. EXAM: PORTABLE CHEST 1 VIEW COMPARISON:  Chest radiograph performed 06/04/2016 FINDINGS: Small bilateral pleural effusions are noted, left greater than right, with bibasilar airspace opacities, concerning for pulmonary edema. This is relatively similar to the recent prior study. Scattered calcification is again noted  overlying the right midlung zone. No pneumothorax is seen. The tracheostomy tube is seen ending 4-5 cm above the carina. The cardiomediastinal silhouette normal is borderline enlarged. No acute osseous abnormalities are seen. IMPRESSION: Small bilateral pleural effusions, left greater than right, with bibasilar airspace opacities, concerning for pulmonary edema. This is relatively similar to the recent prior study. Borderline cardiomegaly. Electronically Signed   By: Garald Balding M.D.   On: 06/05/2016 02:59   Dg Chest Port 1 View  Result Date: 06/04/2016 CLINICAL DATA:  Concern for aspiration EXAM: PORTABLE CHEST 1 VIEW COMPARISON:  05/29/2016 FINDINGS: The tracheostomy tube tip is above the carina. There are bilateral pleural effusions which appear increased in volume from the previous exam. There is resultant decreased aeration to both lungs. Aspirated barium is identified within the right upper lobe. IMPRESSION: 1. Increase in bilateral pleural effusions with worsening aeration to both lungs. Electronically Signed   By: Kerby Moors M.D.   On: 06/04/2016 22:58   Dg Abd Portable 1v  Result Date: 06/05/2016 CLINICAL DATA:  Acute onset of vomiting.  Initial encounter. EXAM: PORTABLE ABDOMEN - 1 VIEW COMPARISON:  None. FINDINGS: The visualized bowel gas pattern is unremarkable. Scattered air and stool filled loops of colon are seen; no abnormal dilatation of small bowel loops is seen to suggest small bowel obstruction. No free intra-abdominal air is identified, though evaluation for free air is limited on a single supine view. The stomach is partially filled with air. Scattered focal densities at the right mid chest may be within the soft tissues or may reflect chronic parenchymal calcification. Clips are noted within the right upper quadrant, reflecting prior cholecystectomy. The visualized osseous structures are within normal limits; the sacroiliac joints are unremarkable in appearance. Small bilateral  pleural effusions are seen. IMPRESSION: 1. Unremarkable bowel gas pattern; no free intra-abdominal air seen. The stomach is partially filled with air. 2. Small bilateral pleural effusions noted. Electronically Signed   By: Garald Balding M.D.   On: 06/05/2016 02:58        Scheduled Meds: . albumin human  50 g Intravenous Once  . amiodarone  100 mg Oral Daily  . aspirin  81 mg Per Tube Daily  . chlorhexidine gluconate (MEDLINE KIT)  15 mL Mouth Rinse BID  . famotidine  20 mg Per Tube BID  . heparin  5,000 Units Subcutaneous Q8H  . insulin aspart  0-9 Units Subcutaneous Q4H  . lacosamide  100 mg Oral BID  . levETIRAcetam  500 mg Per Tube BID  . mouth rinse  15 mL Mouth Rinse QID  . meropenem (MERREM) IV  1 g Intravenous Q8H  . midodrine  10 mg Oral TID WC  . sodium chloride  1,000 mL Intravenous Once   Continuous Infusions: .  sodium chloride 10 mL/hr at 05/31/16 2200  . feeding supplement (VITAL AF 1.2 CAL) 1,000 mL (06/05/16 1626)     LOS: 7 days    Time spent: 40 minutes    WOODS, Geraldo Docker, MD [facility] Pager [phone]   If 7PM-7AM, please contact night-coverage [email or web address] Password [facility] 06/05/2016, 8:13 PM

## 2016-06-05 NOTE — Progress Notes (Signed)
Patient mother and brother at bedside. Requesting information about the Plan of Care and would [facility] [place] [facility]. Explained to them that the patient is declining and that we want to keep them updated on the changes. That we do have concern about the safety of transferring. Explained that they would have to find a hospital/ doctor/ transport to get the patient moved.  Discussed with MD and Palliative. Per Palliative, will further discuss with family tomorrow.   Noe GensStefanie A Rastus Borton, RN

## 2016-06-05 NOTE — Progress Notes (Signed)
CRITICAL VALUE ALERT  Critical value received: Lactic acid 2.8  Date of notification:  06/05/2016  Time of notification:  2103  Critical value read back: Yes  Nurse who received alert:  Scherrie BatemanShraddha Nour Scalise RN  MD notified (1st page): NP Donnamarie PoagK Kirby  Time of first page:  2107 MD notified (2nd page):   Time of second page:  Responding MD:    Time MD responded: 2112. Nacl bolus 500 ml given

## 2016-06-05 NOTE — Progress Notes (Signed)
Pt having thick brown secretion coming out from his trach site. RT was called and NP was paged.

## 2016-06-05 NOTE — Progress Notes (Signed)
INFECTIOUS DISEASE PROGRESS NOTE  ID: Dean Graham is a 55 y.o. 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   Abtx:  Anti-infectives    Start     Dose/Rate Route Frequency Ordered Stop   05/30/16 1800  meropenem (MERREM) 1 g in sodium chloride 0.9 % 100 mL IVPB     1 g 200 mL/hr over 30 Minutes Intravenous Every 8 hours 05/30/16 1327     05/30/16 1200  levofloxacin (LEVAQUIN) IVPB 750 mg  Status:  Discontinued     750 mg 100 mL/hr over 90 Minutes Intravenous Every 24 hours 05/29/16 1211 05/29/16 1705   05/30/16 0300  imipenem-cilastatin (PRIMAXIN) 500 mg in sodium chloride 0.9 % 100 mL IVPB  Status:  Discontinued     500 mg 200 mL/hr over 30 Minutes Intravenous Every 8 hours 05/29/16 1943 05/30/16 1327   05/29/16 2100  aztreonam (AZACTAM) 1 g in dextrose 5 % 50 mL IVPB  Status:  Discontinued     1 g 100 mL/hr over 30 Minutes Intravenous Every 8 hours 05/29/16 1211 05/29/16 1518   05/29/16 1800  imipenem-cilastatin (PRIMAXIN) 500 mg in sodium chloride 0.9 % 100 mL IVPB  Status:  Discontinued     500 mg 200 mL/hr over 30 Minutes Intravenous Every 6 hours 05/29/16 1721 05/29/16 1943   05/29/16 1400  linezolid (ZYVOX) IVPB 600 mg  Status:  Discontinued     600 mg 300 mL/hr over 60 Minutes Intravenous Every 12 hours 05/29/16 1320 06/04/16 1744   05/29/16 1045  levofloxacin (LEVAQUIN) IVPB 750 mg     750 mg 100 mL/hr over 90 Minutes Intravenous  Once 05/29/16 1031 05/29/16 1235   05/29/16 1045  aztreonam (AZACTAM) 2  g in dextrose 5 % 50 mL IVPB     2 g 100 mL/hr over 30 Minutes Intravenous  Once 05/29/16 1031 05/29/16 1330      Medications:  Scheduled: . amiodarone  100 mg Oral Daily  . aspirin  81 mg Per Tube Daily  . chlorhexidine gluconate (MEDLINE KIT)  15 mL Mouth Rinse BID  . famotidine  20 mg Per Tube BID  . heparin  5,000 Units Subcutaneous Q8H  . insulin aspart  0-9 Units Subcutaneous Q4H  . lacosamide  100 mg Oral BID  . levETIRAcetam  500 mg Per Tube BID  . mouth rinse  15 mL Mouth Rinse QID  . meropenem (MERREM) IV  1 g Intravenous Q8H  . midodrine  10 mg Oral TID WC    Objective: Vital signs in last 24 hours: Temp:  [97.8 F (36.6 C)-98.7 F (37.1 C)] 98.7 F (37.1 C) (09/26 0808) Pulse Rate:  [104-124] 124 (09/26 0808) Resp:  [0-40] 29 (09/26 0808) BP: (84-113)/(62-102) 88/74 (09/26 0808) SpO2:  [99 %-100 %] 100 % (09/26 0808) FiO2 (%):  [28 %-40 %] 30 % (09/26 0717) Weight:  [69.5 kg (153 lb 3.5 oz)] 69.5 kg (153 lb 3.5 oz) (09/26 0446)   General appearance: no distress Resp: rhonchi bilaterally Cardio: tachycardia GI: normal  findings: soft, non-tender and abnormal findings:  hypoactive bowel sounds  Lab Results  Recent Labs  06/02/16 1200 06/03/16 0310 06/04/16 0930 06/05/16 0515  WBC 17.7* 20.1* 23.5* 22.4*  HGB 9.1* 9.0* 8.9* 8.8*  HCT 29.0* 28.2* 28.3* 28.0*  NA 145 143  --   --   K 3.3* 3.9  --   --   CL 114* 114*  --   --   CO2 25 26  --   --   BUN 39* 35*  --   --   CREATININE 0.61 0.58*  --   --    Liver Panel No results for input(s): PROT, ALBUMIN, AST, ALT, ALKPHOS, BILITOT, BILIDIR, IBILI in the last 72 hours. Sedimentation Rate No results for input(s): ESRSEDRATE in the last 72 hours. C-Reactive Protein No results for input(s): CRP in the last 72 hours.  Microbiology: Recent Results (from the past 240 hour(s))  Blood Culture (routine x 2)     Status: None   Collection Time: 05/29/16 10:15 AM  Result Value Ref Range Status   Specimen  Description BLOOD RIGHT ANTECUBITAL  Final   Special Requests BOTTLES DRAWN AEROBIC AND ANAEROBIC 5CC  Final   Culture NO GROWTH 5 DAYS  Final   Report Status 06/03/2016 FINAL  Final  Blood Culture (routine x 2)     Status: None   Collection Time: 05/29/16 10:25 AM  Result Value Ref Range Status   Specimen Description BLOOD RIGHT HAND  Final   Special Requests BOTTLES DRAWN AEROBIC AND ANAEROBIC 5CC  Final   Culture NO GROWTH 5 DAYS  Final   Report Status 06/03/2016 FINAL  Final  Urine culture     Status: Abnormal   Collection Time: 05/29/16 12:15 PM  Result Value Ref Range Status   Specimen Description URINE, RANDOM  Final   Special Requests NONE  Final   Culture MULTIPLE SPECIES PRESENT, SUGGEST RECOLLECTION (A)  Final   Report Status 05/30/2016 FINAL  Final  MRSA PCR Screening     Status: None   Collection Time: 05/30/16  2:52 AM  Result Value Ref Range Status   MRSA by PCR NEGATIVE NEGATIVE Final    Comment:        The GeneXpert MRSA Assay (FDA approved for NASAL specimens only), is one component of a comprehensive MRSA colonization surveillance program. It is not intended to diagnose MRSA infection nor to guide or monitor treatment for MRSA infections.   Culture, Urine     Status: None   Collection Time: 05/31/16  3:20 PM  Result Value Ref Range Status   Specimen Description URINE, CATHETERIZED  Final   Special Requests NONE  Final   Culture NO GROWTH  Final   Report Status 06/01/2016 FINAL  Final  Culture, blood (routine x 2)     Status: None (Preliminary result)   Collection Time: 06/04/16  5:42 PM  Result Value Ref Range Status   Specimen Description BLOOD LEFT HAND  Final   Special Requests IN PEDIATRIC BOTTLE  3CC  Final   Culture PENDING  Incomplete   Report Status PENDING  Incomplete  Culture, blood (routine x 2)     Status: None (Preliminary result)   Collection Time: 06/04/16  5:50 PM  Result Value Ref Range Status   Specimen Description BLOOD LEFT HAND   Final   Special Requests BOTTLES DRAWN AEROBIC ONLY  5CC  Final   Culture PENDING  Incomplete   Report Status PENDING  Incomplete  C  difficile quick scan w PCR reflex     Status: None   Collection Time: 06/04/16  6:30 PM  Result Value Ref Range Status   C Diff antigen NEGATIVE NEGATIVE Final   C Diff toxin NEGATIVE NEGATIVE Final   C Diff interpretation No C. difficile detected.  Final  Culture, respiratory (NON-Expectorated)     Status: None (Preliminary result)   Collection Time: 06/04/16 11:28 PM  Result Value Ref Range Status   Specimen Description TRACHEAL ASPIRATE  Final   Special Requests NONE  Final   Gram Stain   Final    RARE SQUAMOUS EPITHELIAL CELLS PRESENT FEW WBC PRESENT,BOTH PMN AND MONONUCLEAR MODERATE GRAM NEGATIVE RODS    Culture PENDING  Incomplete   Report Status PENDING  Incomplete    Studies/Results: Dg Chest Port 1 View  Result Date: 06/05/2016 CLINICAL DATA:  Acute onset of vomiting an shortness of breath. Assess for pneumonia. Initial encounter. EXAM: PORTABLE CHEST 1 VIEW COMPARISON:  Chest radiograph performed 06/04/2016 FINDINGS: Small bilateral pleural effusions are noted, left greater than right, with bibasilar airspace opacities, concerning for pulmonary edema. This is relatively similar to the recent prior study. Scattered calcification is again noted overlying the right midlung zone. No pneumothorax is seen. The tracheostomy tube is seen ending 4-5 cm above the carina. The cardiomediastinal silhouette normal is borderline enlarged. No acute osseous abnormalities are seen. IMPRESSION: Small bilateral pleural effusions, left greater than right, with bibasilar airspace opacities, concerning for pulmonary edema. This is relatively similar to the recent prior study. Borderline cardiomegaly. Electronically Signed   By: Garald Balding M.D.   On: 06/05/2016 02:59   Dg Chest Port 1 View  Result Date: 06/04/2016 CLINICAL DATA:  Concern for aspiration EXAM:  PORTABLE CHEST 1 VIEW COMPARISON:  05/29/2016 FINDINGS: The tracheostomy tube tip is above the carina. There are bilateral pleural effusions which appear increased in volume from the previous exam. There is resultant decreased aeration to both lungs. Aspirated barium is identified within the right upper lobe. IMPRESSION: 1. Increase in bilateral pleural effusions with worsening aeration to both lungs. Electronically Signed   By: Kerby Moors M.D.   On: 06/04/2016 22:58   Dg Abd Portable 1v  Result Date: 06/05/2016 CLINICAL DATA:  Acute onset of vomiting.  Initial encounter. EXAM: PORTABLE ABDOMEN - 1 VIEW COMPARISON:  None. FINDINGS: The visualized bowel gas pattern is unremarkable. Scattered air and stool filled loops of colon are seen; no abnormal dilatation of small bowel loops is seen to suggest small bowel obstruction. No free intra-abdominal air is identified, though evaluation for free air is limited on a single supine view. The stomach is partially filled with air. Scattered focal densities at the right mid chest may be within the soft tissues or may reflect chronic parenchymal calcification. Clips are noted within the right upper quadrant, reflecting prior cholecystectomy. The visualized osseous structures are within normal limits; the sacroiliac joints are unremarkable in appearance. Small bilateral pleural effusions are seen. IMPRESSION: 1. Unremarkable bowel gas pattern; no free intra-abdominal air seen. The stomach is partially filled with air. 2. Small bilateral pleural effusions noted. Electronically Signed   By: Garald Balding M.D.   On: 06/05/2016 02:58     Assessment/Plan: HCAP Prev UTI- proteus  Acute on Chronic respiratory failure Fluid overload Decubitus ulcers  Total days of antibiotics:  Meropenem 9/20 >> Linezolid 9/19 >> 9/25 Aztreonam 9/19 >> 9/19 Levaquin 9/19 >>9/19  Right midline PICC 9/19 >>   Await stool C diff  Await resp and BCx His radiographs did not  show specific cause of leukocytosis Afebrile, WBC slightly better Continued palliative care f/u.  Will continue to watch          [name] Infectious Diseases (pager) [phone] [email or web address] 06/05/2016, 8:33 AM  LOS: 7 days

## 2016-06-05 NOTE — Progress Notes (Addendum)
Pt is now having thick brown secretions coming out from his mouth and nose. RRT was called to assess the pt. NP Craige Cotta was updated.   Chest Xray and ABD xray was done.

## 2016-06-06 ENCOUNTER — Inpatient Hospital Stay (HOSPITAL_COMMUNITY): Payer: Medicare Other

## 2016-06-06 DIAGNOSIS — J9621 Acute and chronic respiratory failure with hypoxia: Secondary | ICD-10-CM | POA: Diagnosis not present

## 2016-06-06 DIAGNOSIS — R6521 Severe sepsis with septic shock: Secondary | ICD-10-CM | POA: Diagnosis not present

## 2016-06-06 DIAGNOSIS — A419 Sepsis, unspecified organism: Secondary | ICD-10-CM | POA: Diagnosis not present

## 2016-06-06 DIAGNOSIS — R509 Fever, unspecified: Secondary | ICD-10-CM | POA: Diagnosis not present

## 2016-06-06 DIAGNOSIS — J189 Pneumonia, unspecified organism: Secondary | ICD-10-CM | POA: Diagnosis not present

## 2016-06-06 DIAGNOSIS — Y95 Nosocomial condition: Secondary | ICD-10-CM | POA: Diagnosis not present

## 2016-06-06 DIAGNOSIS — E8770 Fluid overload, unspecified: Secondary | ICD-10-CM | POA: Diagnosis not present

## 2016-06-06 DIAGNOSIS — Z515 Encounter for palliative care: Secondary | ICD-10-CM | POA: Diagnosis not present

## 2016-06-06 DIAGNOSIS — Z7189 Other specified counseling: Secondary | ICD-10-CM | POA: Diagnosis not present

## 2016-06-06 LAB — CBC WITH DIFFERENTIAL/PLATELET
BASOS ABS: 0 10*3/uL (ref 0.0–0.1)
BASOS PCT: 0 %
EOS ABS: 0.1 10*3/uL (ref 0.0–0.7)
EOS PCT: 1 %
HCT: 23.1 % — ABNORMAL LOW (ref 39.0–52.0)
Hemoglobin: 7 g/dL — ABNORMAL LOW (ref 13.0–17.0)
Lymphocytes Relative: 10 %
Lymphs Abs: 1.5 10*3/uL (ref 0.7–4.0)
MCH: 30.2 pg (ref 26.0–34.0)
MCHC: 30.3 g/dL (ref 30.0–36.0)
MCV: 99.6 fL (ref 78.0–100.0)
MONO ABS: 0.9 10*3/uL (ref 0.1–1.0)
Monocytes Relative: 6 %
Neutro Abs: 12.8 10*3/uL — ABNORMAL HIGH (ref 1.7–7.7)
Neutrophils Relative %: 84 %
PLATELETS: 161 10*3/uL (ref 150–400)
RBC: 2.32 MIL/uL — ABNORMAL LOW (ref 4.22–5.81)
RDW: 18.5 % — AB (ref 11.5–15.5)
WBC: 15.2 10*3/uL — AB (ref 4.0–10.5)

## 2016-06-06 LAB — BASIC METABOLIC PANEL
Anion gap: 5 (ref 5–15)
BUN: 33 mg/dL — AB (ref 6–20)
CHLORIDE: 117 mmol/L — AB (ref 101–111)
CO2: 24 mmol/L (ref 22–32)
CREATININE: 0.43 mg/dL — AB (ref 0.61–1.24)
Calcium: 7.8 mg/dL — ABNORMAL LOW (ref 8.9–10.3)
GFR calc Af Amer: >60 mL/min (ref >60)
GFR calc non Af Amer: >60 mL/min (ref >60)
GLUCOSE: 108 mg/dL — AB (ref 65–99)
Potassium: 3.6 mmol/L (ref 3.5–5.1)
SODIUM: 146 mmol/L — AB (ref 135–145)

## 2016-06-06 LAB — COMPREHENSIVE METABOLIC PANEL
ALT: 48 U/L (ref 17–63)
AST: 74 U/L — ABNORMAL HIGH (ref 15–41)
Albumin: 2.3 g/dL — ABNORMAL LOW (ref 3.5–5.0)
Alkaline Phosphatase: 122 U/L (ref 38–126)
Anion gap: 9 (ref 5–15)
BUN: 34 mg/dL — ABNORMAL HIGH (ref 6–20)
CHLORIDE: 114 mmol/L — AB (ref 101–111)
CO2: 24 mmol/L (ref 22–32)
CREATININE: 0.46 mg/dL — AB (ref 0.61–1.24)
Calcium: 8.1 mg/dL — ABNORMAL LOW (ref 8.9–10.3)
Glucose, Bld: 100 mg/dL — ABNORMAL HIGH (ref 65–99)
POTASSIUM: 5.2 mmol/L — AB (ref 3.5–5.1)
SODIUM: 147 mmol/L — AB (ref 135–145)
Total Bilirubin: 0.8 mg/dL (ref 0.3–1.2)
Total Protein: 4.9 g/dL — ABNORMAL LOW (ref 6.5–8.1)

## 2016-06-06 LAB — GLUCOSE, CAPILLARY
GLUCOSE-CAPILLARY: 130 mg/dL — AB (ref 65–99)
GLUCOSE-CAPILLARY: 111 mg/dL — AB (ref 65–99)
GLUCOSE-CAPILLARY: 113 mg/dL — AB (ref 65–99)
GLUCOSE-CAPILLARY: 90 mg/dL (ref 65–99)
Glucose-Capillary: 114 mg/dL — ABNORMAL HIGH (ref 65–99)
Glucose-Capillary: 106 mg/dL — ABNORMAL HIGH (ref 65–99)

## 2016-06-06 LAB — LACTIC ACID, PLASMA: LACTIC ACID, VENOUS: 2.7 mmol/L — AB (ref 0.5–1.9)

## 2016-06-06 LAB — HEMOGLOBIN AND HEMATOCRIT, BLOOD
HCT: 18.3 % — ABNORMAL LOW (ref 39.0–52.0)
HEMOGLOBIN: 5.7 g/dL — AB (ref 13.0–17.0)

## 2016-06-06 LAB — MAGNESIUM: MAGNESIUM: 2.1 mg/dL (ref 1.7–2.4)

## 2016-06-06 LAB — URINE CULTURE: CULTURE: NO GROWTH

## 2016-06-06 LAB — PREALBUMIN: PREALBUMIN: 5.9 mg/dL — AB (ref 18–38)

## 2016-06-06 LAB — PREPARE RBC (CROSSMATCH)

## 2016-06-06 MED ORDER — DEXTROSE 5 % IV SOLN
INTRAVENOUS | Status: DC
  Administered 2016-06-06 – 2016-06-07 (×2): via INTRAVENOUS

## 2016-06-06 MED ORDER — SODIUM CHLORIDE 0.9 % IV SOLN: Freq: Once | INTRAVENOUS | Status: AC

## 2016-06-06 MED ORDER — SODIUM CHLORIDE 0.9 % IV BOLUS (SEPSIS)
250.0000 mL | Freq: Once | INTRAVENOUS | Status: AC
Start: 2016-06-06 — End: 2016-06-06
  Administered 2016-06-06: 250 mL via INTRAVENOUS

## 2016-06-06 MED ORDER — ALBUMIN HUMAN 25 % IV SOLN
50.0000 g | Freq: Once | INTRAVENOUS | Status: AC
Start: 2016-06-06 — End: 2016-06-06
  Administered 2016-06-06: 50 g via INTRAVENOUS
  Filled 2016-06-06: qty 200

## 2016-06-06 MED ORDER — SODIUM CHLORIDE 0.9 % IV BOLUS (SEPSIS)
500.0000 mL | Freq: Once | INTRAVENOUS | Status: AC
  Administered 2016-06-06: 500 mL via INTRAVENOUS

## 2016-06-06 NOTE — Progress Notes (Addendum)
PROGRESS NOTE    Dean Graham  IHK:[id number] DOB: Aug 30, 1961 DOA: 05/29/2016 PCP: Verneita Griffes, MD   Brief Narrative:  55 y.o.BM SNF resident PMHx TBI, Quadriplegia , Chronic Trach-dependent respiratory failure, PEG tube, Neurogenic bladder with Chronic foley, Atrial fibrillation , Diastolic CHF, Anemia of chronic disease,Diabetes mellitus Type 2 without complication  Who was sent from [place] 9/19 with fever, hypotension and increased work of breathing.  In ED, pt was febrile with T max of 100.7 F, hypotensive with BP of 72/60 without improvement after 4L IV fluids. He is white blood cell count was 23. Chest x-ray showed bilateral pleural effusions with pneumonia versus atelectasis. Patient was admitted to ICU for possible need for pressor support. Patient was transferred to stepdown unit 05/30/2016. On meropenem and Linezolid. Now with increasing white count   Subjective: On vent, non verbal. , follow some command for nurse.  Watery diarrhea continue, his BP is low this morning, scrotal swelling and redness persist    Assessment & Plan:   Principal Problem:   Septic shock (HCC) Active Problems:   HCAP (healthcare-associated pneumonia)   Pressure injury of skin   Quadriplegia (HCC)   Atrial fibrillation (HCC)   Diabetes mellitus without complication ([place])   Chronic respiratory failure (HCC)   Pressure ulcer stage IV ([place])   Encounter for wound care   Protein-calorie malnutrition, severe ([place])   Palliative care encounter   Goals of care, counseling/discussion   Chronic post traumatic encephalopathy   Acute encephalopathy   Hypokalemia   Hypomagnesemia   Urinary tract infection, site not specified   Acute on chronic respiratory failure with hypoxia (HCC)   Dysphagia   Decubitus ulcer of sacral area   1-Septic shock/HCAP/ UTI  -Pt with chronic indwelling Foley catheter  - blood cx negative. -Patient remains tachycardic and hypotensive with increasing white count.  Continue antibiotics per ID -Chest x-ray on admission showed bilateral pleural effusions with pneumonia vs atelectasis Repeat Albumin 50 g 1 - Continue midodrine 10 mg TID -C diff negative. Respiratory culture: pending. Blood culture 9-25 no growth to date.  -IV bolus this am, maintenance fluids at 50 cc /hr.  -will order scrotal US. Will discussed  with ID.   Mild troponin elevation - Due to demand ischemic from septic shock - Troponin 0.09 --> 0.07  - No further work up needed at this time  -ECHO pending.   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   Hyperkalemia / Hypokalemia  - Repeat labs this afternoon.   Hypomagnesemia -Magnesium goal> 2 - Hypocalcemia -Corrected calcium= 10.1   Acute on Chronic respiratory failure with hypoxia, trach-dependent - Stable respiratory status   Chronic diastolic CHF -Echocardiogram pending - Holding lasix due to soft BP, 111/80  Chronic atrial fibrillation(CHADS vasc score 2) - On amiodarone and aspirin   Dysphagia, PEG tube-dependent / Severe protein calorie malnutrition  - Chronic, stable  - Continue tube feeds   Quadriplegia - Stable   Neurogenic bladder - Chronic, stable - Has Foley catheter in place  Anemia of chronic disease - Baseline hemoglobin around 8 - S/P 1 unit PRBC transfusion - Repeat labs this afternoon , might need blood transfusion.   Seizure disorder - Continue keppra, vimpat  Stage 4 sacral pressure ulcer, stage 3 right and left hip sacral ulcer - Appreciate wound  care assessment - Stage 4Sacrum 10cm x 8cm x 0.2cm with 100% red gran tissue, mod amtdrainage green, mild odor - Healing stage 3 righthip 2.5cm x 2 cm 100 % red granulating, no drainage - Left hip Stage 3 ulcer 4.5cm x 7cm x 1cm,  100% red granulating, modamt green drainage, mild odor  -Left great toe full thickness 0.8cm x 0.8cm 100% red gran tissue, no drainage or odor - L flank MARSI 0.5cm x 0.3cm pink and dry - L knee full thickness 2.5cm x 2cm red and moistno drainage or odor noted - L heel Stage 2 0.3cm x 0.3cm pink and moist no drainage or odor - Per WOC, apply quacell for sacrum and left hip to absorb drainage and provide antimicrobial benifitsand allevyn foam to the others -Placed on a mattress and ensure on rotation at all times   Goals of care Palliative care consulted.      DVT prophylaxis: Subcutaneous heparin  Code Status: DO NOT RESUSCITATE Family Communication: None Disposition Plan: Await further palliative care recommendation   Consultants:  [name] ID Dr.Daniel Lily Koc[facility] M    Procedures/Significant Events:  None   Cultures 9/19 blood right AC/hand negative 9/19 urine positive multiple species 9/20 MRSA by PCR negative 9/21 urine negative 9/25 blood left hand 2 NGTD 9/25 urine pending 9/25 tracheal aspirate positive GNR    Antimicrobials: Meropenem 9/20 >> Linezolid 9/19 >> 9/25 Aztreonam 9/19 >> 9/19 Levaquin 9/19 >>9/19   Devices None   LINES / TUBES:  Right midline PICC 9/19 >>    Continuous Infusions: . sodium chloride 20 mL/hr at 06/06/16 0333  . feeding supplement (VITAL AF 1.2 CAL) 1,000 mL (06/06/16 0547)     Objective: Vitals:   06/06/16 0343 06/06/16 0351 06/06/16 0500 06/06/16 0707  BP: (!) 88/66  (!) 87/71 (!) 80/42  Pulse: (!) 118  (!) 119 (!) 121  Resp: (!) 0  (!) 6 (!) 22  Temp: 97.8 F (36.6 C)   98.7 F (37.1 C)  TempSrc: Axillary   Oral  SpO2: 100% 99% 100% 100%  Weight:      Height:        Intake/Output Summary (Last 24 hours) at 06/06/16 0737 Last data filed at 06/06/16 0338  Gross per 24 hour  Intake          2954.66 ml  Output             1400 ml  Net          1554.66 ml   Filed Weights    06/04/16 0354 06/05/16 0446 06/06/16 0337  Weight: 67.2 kg (148 lb 2.4 oz) 69.5 kg (153 lb 3.5 oz) 71.7 kg (158 lb)    Examination:  General: A/O 0, positive chronic respiratory distress (on vent) Eyes: negative scleral hemorrhage, negative anisocoria, negative icterus, right eye swollen ENT: Negative Runny nose, negative gingival bleeding, Neck:  Negative scars, masses, torticollis, lymphadenopathy, JVD Lungs: coarse breath sounds diffusely, negative wheezes or crackles Cardiovascular: Regular rate and rhythm without murmur gallop or rub normal S1 and S2 Abdomen: negative abdominal pain, nondistended, positive soft, bowel sounds, no rebound, no ascites, no appreciable mass, PEG site covered and clean negative sign of infection Extremities: No significant cyanosis, clubbing. Plus bilateral lower extremity edema 2+ Skin: Multiple hip/sacral ulcers stage 3/4 (did not review today).  Psychiatric:  Unable to evaluate  Central nervous system:  Grimaces to painful stimuli: Unable to complete evaluation secondary to altered mental status (unsure of  baseline)   .     Data Reviewed: Care during the described time interval was provided by me .  I have reviewed this patient's available data, including medical history, events of note, physical examination, and all test results as part of my evaluation. I have personally reviewed and interpreted all radiology studies.  CBC:  Recent Labs Lab 05/31/16 0453  06/02/16 1200 06/03/16 0310 06/04/16 0930 06/05/16 0515 06/06/16 0428  WBC 10.1  --  17.7* 20.1* 23.5* 22.4* 15.2*  NEUTROABS 8.8*  --   --   --  20.5* 19.7* 12.8*  HGB 6.1*  < > 9.1* 9.0* 8.9* 8.8* 7.0*  HCT 20.6*  < > 29.0* 28.2* 28.3* 28.0* 23.1*  MCV 101.0*  --  97.0 97.2 97.9 97.9 99.6  PLT 213  --  215 210 178 196 161  < > = values in this interval not displayed. Basic Metabolic Panel:  Recent Labs Lab 05/31/16 0620  05/31/16 1513 06/01/16 0545 06/01/16 1700 06/02/16 0500  06/02/16 1200 06/03/16 0310 06/05/16 0910 06/06/16 0630  NA 143  --   --   --   --   --  145 143 144 147*  K 4.1  --   --   --   --   --  3.3* 3.9 4.0 5.2*  CL 113*  --   --   --   --   --  114* 114* 114* 114*  CO2 24  --   --   --   --   --  _0 GLUCOSE 76  --   --   --   --   --  96 105* 113* 100*  BUN 39*  --   --   --   --   --  39* 35* 33* 34*  CREATININE 0.82  --   --   --   --   --  0.61 0.58* 0.52* 0.46*  CALCIUM 8.0*  --   --   --   --   --  7.9* 7.7* 7.7* 8.1*  MG  --   < > 2.0 1.7 1.7 1.9  --   --  1.6* 2.1  PHOS  --   --  4.7* 3.1 2.6 2.5  --   --   --   --   < > = values in this interval not displayed. GFR: Estimated Creatinine Clearance: 103.3 mL/min (by C-G formula based on SCr of 0.46 mg/dL (L)). Liver Function Tests:  Recent Labs Lab 06/05/16 0910 06/06/16 0630  AST 66* 74*  ALT 60 48  ALKPHOS 165* 122  BILITOT 0.8 0.8  PROT 4.7* 4.9*  ALBUMIN 1.2* 2.3*   No results for input(s): LIPASE, AMYLASE in the last 168 hours. No results for input(s): AMMONIA in the last 168 hours. Coagulation Profile: No results for input(s): INR, PROTIME in the last 168 hours. Cardiac Enzymes: No results for input(s): CKTOTAL, CKMB, CKMBINDEX, TROPONINI in the last 168 hours. BNP (last 3 results) No results for input(s): PROBNP in the last 8760 hours. HbA1C: No results for input(s): HGBA1C in the last 72 hours. CBG:  Recent Labs Lab 06/05/16 1652 06/05/16 1929 06/05/16 2345 06/06/16 0331 06/06/16 0726  GLUCAP 95 108* 120* 130* 111*   Lipid Profile: No results for input(s): CHOL, HDL, LDLCALC, TRIG, CHOLHDL, LDLDIRECT in the last 72 hours. Thyroid Function Tests: No results for input(s): TSH, T4TOTAL, FREET4, T3FREE, THYROIDAB in the last 72 hours. Anemia Panel: No results  for input(s): VITAMINB12, FOLATE, FERRITIN, TIBC, IRON, RETICCTPCT in the last 72 hours. Urine analysis:    Component Value Date/Time   COLORURINE YELLOW 06/04/2016 2034   APPEARANCEUR  CLEAR 06/04/2016 2034   LABSPEC 1.027 06/04/2016 2034   PHURINE 6.0 06/04/2016 2034   GLUCOSEU NEGATIVE 06/04/2016 2034   HGBUR NEGATIVE 06/04/2016 2034   BILIRUBINUR NEGATIVE 06/04/2016 2034   KETONESUR NEGATIVE 06/04/2016 2034   PROTEINUR NEGATIVE 06/04/2016 2034   NITRITE NEGATIVE 06/04/2016 2034   LEUKOCYTESUR NEGATIVE 06/04/2016 2034   Sepsis Labs: _0 (procalcitonin:4,lacticidven:4)  ) Recent Results (from the past 240 hour(s))  Blood Culture (routine x 2)     Status: None   Collection Time: 05/29/16 10:15 AM  Result Value Ref Range Status   Specimen Description BLOOD RIGHT ANTECUBITAL  Final   Special Requests BOTTLES DRAWN AEROBIC AND ANAEROBIC 5CC  Final   Culture NO GROWTH 5 DAYS  Final   Report Status 06/03/2016 FINAL  Final  Blood Culture (routine x 2)     Status: None   Collection Time: 05/29/16 10:25 AM  Result Value Ref Range Status   Specimen Description BLOOD RIGHT HAND  Final   Special Requests BOTTLES DRAWN AEROBIC AND ANAEROBIC 5CC  Final   Culture NO GROWTH 5 DAYS  Final   Report Status 06/03/2016 FINAL  Final  Urine culture     Status: Abnormal   Collection Time: 05/29/16 12:15 PM  Result Value Ref Range Status   Specimen Description URINE, RANDOM  Final   Special Requests NONE  Final   Culture MULTIPLE SPECIES PRESENT, SUGGEST RECOLLECTION (A)  Final   Report Status 05/30/2016 FINAL  Final  MRSA PCR Screening     Status: None   Collection Time: 05/30/16  2:52 AM  Result Value Ref Range Status   MRSA by PCR NEGATIVE NEGATIVE Final    Comment:        The GeneXpert MRSA Assay (FDA approved for NASAL specimens only), is one component of a comprehensive MRSA colonization surveillance program. It is not intended to diagnose MRSA infection nor to guide or monitor treatment for MRSA infections.   Culture, Urine     Status: None   Collection Time: 05/31/16  3:20 PM  Result Value Ref Range Status   Specimen Description URINE, CATHETERIZED   Final   Special Requests NONE  Final   Culture NO GROWTH  Final   Report Status 06/01/2016 FINAL  Final  Culture, blood (routine x 2)     Status: None (Preliminary result)   Collection Time: 06/04/16  5:42 PM  Result Value Ref Range Status   Specimen Description BLOOD LEFT HAND  Final   Special Requests IN PEDIATRIC BOTTLE  3CC  Final   Culture NO GROWTH < 24 HOURS  Final   Report Status PENDING  Incomplete  Culture, blood (routine x 2)     Status: None (Preliminary result)   Collection Time: 06/04/16  5:50 PM  Result Value Ref Range Status   Specimen Description BLOOD LEFT HAND  Final   Special Requests BOTTLES DRAWN AEROBIC ONLY  5CC  Final   Culture NO GROWTH < 24 HOURS  Final   Report Status PENDING  Incomplete  C difficile quick scan w PCR reflex     Status: None   Collection Time: 06/04/16  6:30 PM  Result Value Ref Range Status   C Diff antigen NEGATIVE NEGATIVE Final   C Diff toxin NEGATIVE NEGATIVE Final   C Diff interpretation No  C. difficile detected.  Final  Culture, respiratory (NON-Expectorated)     Status: None (Preliminary result)   Collection Time: 06/04/16 11:28 PM  Result Value Ref Range Status   Specimen Description TRACHEAL ASPIRATE  Final   Special Requests NONE  Final   Gram Stain   Final    RARE SQUAMOUS EPITHELIAL CELLS PRESENT FEW WBC PRESENT,BOTH PMN AND MONONUCLEAR MODERATE GRAM NEGATIVE RODS    Culture PENDING  Incomplete   Report Status PENDING  Incomplete         Radiology Studies: Dg Chest Port 1 View  Result Date: 06/05/2016 CLINICAL DATA:  Acute onset of vomiting an shortness of breath. Assess for pneumonia. Initial encounter. EXAM: PORTABLE CHEST 1 VIEW COMPARISON:  Chest radiograph performed 06/04/2016 FINDINGS: Small bilateral pleural effusions are noted, left greater than right, with bibasilar airspace opacities, concerning for pulmonary edema. This is relatively similar to the recent prior study. Scattered calcification is again  noted overlying the right midlung zone. No pneumothorax is seen. The tracheostomy tube is seen ending 4-5 cm above the carina. The cardiomediastinal silhouette normal is borderline enlarged. No acute osseous abnormalities are seen. IMPRESSION: Small bilateral pleural effusions, left greater than right, with bibasilar airspace opacities, concerning for pulmonary edema. This is relatively similar to the recent prior study. Borderline cardiomegaly. Electronically Signed   By: Garald Balding M.D.   On: 06/05/2016 02:59   Dg Chest Port 1 View  Result Date: 06/04/2016 CLINICAL DATA:  Concern for aspiration EXAM: PORTABLE CHEST 1 VIEW COMPARISON:  05/29/2016 FINDINGS: The tracheostomy tube tip is above the carina. There are bilateral pleural effusions which appear increased in volume from the previous exam. There is resultant decreased aeration to both lungs. Aspirated barium is identified within the right upper lobe. IMPRESSION: 1. Increase in bilateral pleural effusions with worsening aeration to both lungs. Electronically Signed   By: Kerby Moors M.D.   On: 06/04/2016 22:58   Dg Abd Portable 1v  Result Date: 06/05/2016 CLINICAL DATA:  Acute onset of vomiting.  Initial encounter. EXAM: PORTABLE ABDOMEN - 1 VIEW COMPARISON:  None. FINDINGS: The visualized bowel gas pattern is unremarkable. Scattered air and stool filled loops of colon are seen; no abnormal dilatation of small bowel loops is seen to suggest small bowel obstruction. No free intra-abdominal air is identified, though evaluation for free air is limited on a single supine view. The stomach is partially filled with air. Scattered focal densities at the right mid chest may be within the soft tissues or may reflect chronic parenchymal calcification. Clips are noted within the right upper quadrant, reflecting prior cholecystectomy. The visualized osseous structures are within normal limits; the sacroiliac joints are unremarkable in appearance. Small  bilateral pleural effusions are seen. IMPRESSION: 1. Unremarkable bowel gas pattern; no free intra-abdominal air seen. The stomach is partially filled with air. 2. Small bilateral pleural effusions noted. Electronically Signed   By: Garald Balding M.D.   On: 06/05/2016 02:58        Scheduled Meds: . amiodarone  100 mg Oral Daily  . aspirin  81 mg Per Tube Daily  . chlorhexidine gluconate (MEDLINE KIT)  15 mL Mouth Rinse BID  . famotidine  20 mg Per Tube BID  . heparin  5,000 Units Subcutaneous Q8H  . insulin aspart  0-9 Units Subcutaneous Q4H  . lacosamide  100 mg Oral BID  . levETIRAcetam  500 mg Per Tube BID  . mouth rinse  15 mL Mouth Rinse QID  .  meropenem (MERREM) IV  1 g Intravenous Q8H  . midodrine  10 mg Oral TID WC   Continuous Infusions: . sodium chloride 20 mL/hr at 06/06/16 0333  . feeding supplement (VITAL AF 1.2 CAL) 1,000 mL (06/06/16 0547)     LOS: 8 days    Time spent: 35 minutes    Elmarie Shiley, MD [facility] Pager [phone]  If 7PM-7AM, please contact night-coverage [email or web address] Password [facility] 06/06/2016, 7:37 AM

## 2016-06-06 NOTE — Plan of Care (Signed)
Problem: Health Behavior/Discharge Planning: Goal: Ability to manage health-related needs will improve Outcome: Progressing Family due to make decision about goals of care 9/28.   Problem: Physical Regulation: Goal: Ability to maintain clinical measurements within normal limits will improve Outcome: Not Progressing Care taken to try to maintain patient blood pressure today. Given albumin, and several boluses with little change. Patient continues to have brownish secretions from trach site and oral. MD aware. Patient receiving 2 units of blood due to a Hgb of 5.7.  Problem: Skin Integrity: Goal: Risk for impaired skin integrity will decrease Outcome: Progressing Pt on low air loss  Rotational bed. Also frequent turns and skin care. Careful attention paid to keeping scrotum elevated and dry today as it was weeping.

## 2016-06-06 NOTE — Progress Notes (Signed)
CRITICAL VALUE ALERT  Critical value received: Lactic acid 2.7   Date of notification: 06/06/2016  Time of notification: 0025   Critical value read back: Yes  Nurse who received alert: Scherrie Bateman RN  MD notified (1st page):  NP Donnamarie Poag  Time of first page:  [phone]  MD notified (2nd page):  Time of second page:  Responding MD: NP Donnamarie Poag  Time MD responded: [phone]

## 2016-06-06 NOTE — Progress Notes (Signed)
CRITICAL VALUE ALERT  Critical value received:  Hemoglobin 5.7  Date of notification:  06/06/2016 Time of notification:  1215  Critical value read back:Yes.    Nurse who received alert:  Rachael Darby, RN  MD notified (1st page):  Dr. Sunnie Nielsen  Time of first page:  1422  MD notified (2nd page):  Time of second page:  Responding MD:  Dr. Sunnie Nielsen  Time MD responded:  1423  Received critical value from RN at 1415 and notified MD immediately  Noe Gens, RN

## 2016-06-06 NOTE — Progress Notes (Signed)
INFECTIOUS DISEASE PROGRESS NOTE  ID: Dean Graham is a 55 y.o. 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[place])   Encounter for wound care   Protein-calorie malnutrition, severe ([place])   Palliative care encounter   Goals of care, counseling/discussion   Chronic post traumatic encephalopathy   Acute encephalopathy   Hypokalemia   Hypomagnesemia   Urinary tract infection, site not specified   Acute on chronic respiratory failure with hypoxia ([place])   Dysphagia   Decubitus ulcer of sacral area  Subjective: No response  Abtx:  Anti-infectives    Start     Dose/Rate Route Frequency Ordered Stop   05/30/16 1800  meropenem (MERREM) 1 g in sodium chloride 0.9 % 100 mL IVPB     1 g 200 mL/hr over 30 Minutes Intravenous Every 8 hours 05/30/16 1327     05/30/16 1200  levofloxacin (LEVAQUIN) IVPB 750 mg  Status:  Discontinued     750 mg 100 mL/hr over 90 Minutes Intravenous Every 24 hours 05/29/16 1211 05/29/16 1705   05/30/16 0300  imipenem-cilastatin (PRIMAXIN) 500 mg in sodium chloride 0.9 % 100 mL IVPB  Status:  Discontinued     500 mg 200 mL/hr over 30 Minutes Intravenous Every 8 hours 05/29/16 1943 05/30/16 1327   05/29/16 2100  aztreonam (AZACTAM) 1 g in dextrose 5 % 50 mL IVPB  Status:  Discontinued     1 g 100 mL/hr over 30 Minutes Intravenous Every 8 hours 05/29/16 1211 05/29/16 1518   05/29/16 1800  imipenem-cilastatin (PRIMAXIN) 500 mg in sodium chloride 0.9 % 100 mL IVPB  Status:  Discontinued     500 mg 200 mL/hr over 30 Minutes Intravenous Every 6 hours 05/29/16 1721 05/29/16 1943   05/29/16 1400  linezolid (ZYVOX) IVPB 600 mg  Status:  Discontinued     600 mg 300 mL/hr over 60 Minutes Intravenous Every 12 hours  05/29/16 1320 06/04/16 1744   05/29/16 1045  levofloxacin (LEVAQUIN) IVPB 750 mg     750 mg 100 mL/hr over 90 Minutes Intravenous  Once 05/29/16 1031 05/29/16 1235   05/29/16 1045  aztreonam (AZACTAM) 2 g in dextrose 5 % 50 mL IVPB     2 g 100 mL/hr over 30 Minutes Intravenous  Once 05/29/16 1031 05/29/16 1330      Medications:  Scheduled: . amiodarone  100 mg Oral Daily  . aspirin  81 mg Per Tube Daily  . chlorhexidine gluconate (MEDLINE KIT)  15 mL Mouth Rinse BID  . famotidine  20 mg Per Tube BID  . heparin  5,000 Units Subcutaneous Q8H  . insulin aspart  0-9 Units Subcutaneous Q4H  . lacosamide  100 mg Oral BID  . levETIRAcetam  500 mg Per Tube BID  . mouth rinse  15 mL Mouth Rinse QID  . meropenem (MERREM) IV  1 g Intravenous Q8H  . midodrine  10 mg Oral TID WC    Objective: Vital signs in last 24 hours: Temp:  [96.6 F (35.9 C)-98.8 F (37.1 C)] 98.7 F (37.1 C) (09/27 0707) Pulse Rate:  [105-125] 121 (09/27 0707) Resp:  [0-22] 22 (09/27 0707) BP: (75-105)/(42-74) 80/42 (09/27 0707) SpO2:  [99 %-100 %] 100 % (09/27 0707) FiO2 (%):  [  30 %] 30 % (09/27 0707) Weight:  [71.7 kg (158 lb)] 71.7 kg (158 lb) (09/27 0337)   General appearance: no distress Resp: rhonchi bilaterally Cardio: tachycardia GI: normal findings: bowel sounds normal Extremities: anasarca, dressings in palce  Lab Results  Recent Labs  06/05/16 0515 06/05/16 0910 06/06/16 0428 06/06/16 0630  WBC 22.4*  --  15.2*  --   HGB 8.8*  --  7.0*  --   HCT 28.0*  --  23.1*  --   NA  --  144  --  147*  K  --  4.0  --  5.2*  CL  --  114*  --  114*  CO2  --  24  --  24  BUN  --  33*  --  34*  CREATININE  --  0.52*  --  0.46*   Liver Panel  Recent Labs  06/05/16 0910 06/06/16 0630  PROT 4.7* 4.9*  ALBUMIN 1.2* 2.3*  AST 66* 74*  ALT 60 48  ALKPHOS 165* 122  BILITOT 0.8 0.8   Sedimentation Rate No results for input(s): ESRSEDRATE in the last 72 hours. C-Reactive Protein No results  for input(s): CRP in the last 72 hours.  Microbiology: Recent Results (from the past 240 hour(s))  Blood Culture (routine x 2)     Status: None   Collection Time: 05/29/16 10:15 AM  Result Value Ref Range Status   Specimen Description BLOOD RIGHT ANTECUBITAL  Final   Special Requests BOTTLES DRAWN AEROBIC AND ANAEROBIC 5CC  Final   Culture NO GROWTH 5 DAYS  Final   Report Status 06/03/2016 FINAL  Final  Blood Culture (routine x 2)     Status: None   Collection Time: 05/29/16 10:25 AM  Result Value Ref Range Status   Specimen Description BLOOD RIGHT HAND  Final   Special Requests BOTTLES DRAWN AEROBIC AND ANAEROBIC 5CC  Final   Culture NO GROWTH 5 DAYS  Final   Report Status 06/03/2016 FINAL  Final  Urine culture     Status: Abnormal   Collection Time: 05/29/16 12:15 PM  Result Value Ref Range Status   Specimen Description URINE, RANDOM  Final   Special Requests NONE  Final   Culture MULTIPLE SPECIES PRESENT, SUGGEST RECOLLECTION (A)  Final   Report Status 05/30/2016 FINAL  Final  MRSA PCR Screening     Status: None   Collection Time: 05/30/16  2:52 AM  Result Value Ref Range Status   MRSA by PCR NEGATIVE NEGATIVE Final    Comment:        The GeneXpert MRSA Assay (FDA approved for NASAL specimens only), is one component of a comprehensive MRSA colonization surveillance program. It is not intended to diagnose MRSA infection nor to guide or monitor treatment for MRSA infections.   Culture, Urine     Status: None   Collection Time: 05/31/16  3:20 PM  Result Value Ref Range Status   Specimen Description URINE, CATHETERIZED  Final   Special Requests NONE  Final   Culture NO GROWTH  Final   Report Status 06/01/2016 FINAL  Final  Culture, blood (routine x 2)     Status: None (Preliminary result)   Collection Time: 06/04/16  5:42 PM  Result Value Ref Range Status   Specimen Description BLOOD LEFT HAND  Final   Special Requests IN PEDIATRIC BOTTLE  3CC  Final   Culture NO  GROWTH < 24 HOURS  Final   Report Status PENDING  Incomplete  Culture, blood (routine x 2)     Status: None (Preliminary result)   Collection Time: 06/04/16  5:50 PM  Result Value Ref Range Status   Specimen Description BLOOD LEFT HAND  Final   Special Requests BOTTLES DRAWN AEROBIC ONLY  5CC  Final   Culture NO GROWTH < 24 HOURS  Final   Report Status PENDING  Incomplete  C difficile quick scan w PCR reflex     Status: None   Collection Time: 06/04/16  6:30 PM  Result Value Ref Range Status   C Diff antigen NEGATIVE NEGATIVE Final   C Diff toxin NEGATIVE NEGATIVE Final   C Diff interpretation No C. difficile detected.  Final  Urine culture     Status: None   Collection Time: 06/04/16  8:33 PM  Result Value Ref Range Status   Specimen Description URINE, CATHETERIZED  Final   Special Requests NONE  Final   Culture NO GROWTH  Final   Report Status 06/06/2016 FINAL  Final  Culture, respiratory (NON-Expectorated)     Status: None (Preliminary result)   Collection Time: 06/04/16 11:28 PM  Result Value Ref Range Status   Specimen Description TRACHEAL ASPIRATE  Final   Special Requests NONE  Final   Gram Stain   Final    RARE SQUAMOUS EPITHELIAL CELLS PRESENT FEW WBC PRESENT,BOTH PMN AND MONONUCLEAR MODERATE GRAM NEGATIVE RODS    Culture PENDING  Incomplete   Report Status PENDING  Incomplete    Studies/Results: Dg Chest Port 1 View  Result Date: 06/05/2016 CLINICAL DATA:  Acute onset of vomiting an shortness of breath. Assess for pneumonia. Initial encounter. EXAM: PORTABLE CHEST 1 VIEW COMPARISON:  Chest radiograph performed 06/04/2016 FINDINGS: Small bilateral pleural effusions are noted, left greater than right, with bibasilar airspace opacities, concerning for pulmonary edema. This is relatively similar to the recent prior study. Scattered calcification is again noted overlying the right midlung zone. No pneumothorax is seen. The tracheostomy tube is seen ending 4-5 cm above the  carina. The cardiomediastinal silhouette normal is borderline enlarged. No acute osseous abnormalities are seen. IMPRESSION: Small bilateral pleural effusions, left greater than right, with bibasilar airspace opacities, concerning for pulmonary edema. This is relatively similar to the recent prior study. Borderline cardiomegaly. Electronically Signed   By: Garald Balding M.D.   On: 06/05/2016 02:59   Dg Chest Port 1 View  Result Date: 06/04/2016 CLINICAL DATA:  Concern for aspiration EXAM: PORTABLE CHEST 1 VIEW COMPARISON:  05/29/2016 FINDINGS: The tracheostomy tube tip is above the carina. There are bilateral pleural effusions which appear increased in volume from the previous exam. There is resultant decreased aeration to both lungs. Aspirated barium is identified within the right upper lobe. IMPRESSION: 1. Increase in bilateral pleural effusions with worsening aeration to both lungs. Electronically Signed   By: Kerby Moors M.D.   On: 06/04/2016 22:58   Dg Abd Portable 1v  Result Date: 06/05/2016 CLINICAL DATA:  Acute onset of vomiting.  Initial encounter. EXAM: PORTABLE ABDOMEN - 1 VIEW COMPARISON:  None. FINDINGS: The visualized bowel gas pattern is unremarkable. Scattered air and stool filled loops of colon are seen; no abnormal dilatation of small bowel loops is seen to suggest small bowel obstruction. No free intra-abdominal air is identified, though evaluation for free air is limited on a single supine view. The stomach is partially filled with air. Scattered focal densities at the right mid chest may be within the soft tissues or may reflect chronic parenchymal calcification. Clips  are noted within the right upper quadrant, reflecting prior cholecystectomy. The visualized osseous structures are within normal limits; the sacroiliac joints are unremarkable in appearance. Small bilateral pleural effusions are seen. IMPRESSION: 1. Unremarkable bowel gas pattern; no free intra-abdominal air seen. The  stomach is partially filled with air. 2. Small bilateral pleural effusions noted. Electronically Signed   By: Garald Balding M.D.   On: 06/05/2016 02:58     Assessment/Plan: HCAP Prev UTI- proteus  Acute on Chronic respiratory failure Fluid overload Decubitus ulcers  Total days of antibiotics:  Meropenem 9/20 >> Linezolid 9/19 >> 9/25 Aztreonam 9/19 >> 9/19 Levaquin 9/19 >>9/19  Right midline PICC 9/19 >>   C diff (-) Await resp and BCx Afebrile, WBC continues to improve Continued palliative care f/u.  Will continue to watch         [name] Infectious Diseases (pager) [phone] [email or web address] 06/06/2016, 8:53 AM  LOS: 8 days
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
## 2016-06-06 NOTE — Plan of Care (Signed)
Problem: Physical Regulation: Goal: Will remain free from infection Outcome: Progressing  Foley care done every shift and as needed to prevent CAUTI.

## 2016-06-07 DIAGNOSIS — Z7189 Other specified counseling: Secondary | ICD-10-CM | POA: Diagnosis not present

## 2016-06-07 DIAGNOSIS — R509 Fever, unspecified: Secondary | ICD-10-CM | POA: Diagnosis not present

## 2016-06-07 DIAGNOSIS — Y95 Nosocomial condition: Secondary | ICD-10-CM | POA: Diagnosis not present

## 2016-06-07 DIAGNOSIS — J189 Pneumonia, unspecified organism: Secondary | ICD-10-CM | POA: Diagnosis not present

## 2016-06-07 DIAGNOSIS — A419 Sepsis, unspecified organism: Secondary | ICD-10-CM | POA: Diagnosis not present

## 2016-06-07 DIAGNOSIS — R6521 Severe sepsis with septic shock: Secondary | ICD-10-CM | POA: Diagnosis not present

## 2016-06-07 DIAGNOSIS — E8770 Fluid overload, unspecified: Secondary | ICD-10-CM | POA: Diagnosis not present

## 2016-06-07 DIAGNOSIS — J9621 Acute and chronic respiratory failure with hypoxia: Secondary | ICD-10-CM | POA: Diagnosis not present

## 2016-06-07 DIAGNOSIS — Z515 Encounter for palliative care: Secondary | ICD-10-CM | POA: Diagnosis not present

## 2016-06-07 LAB — CBC WITH DIFFERENTIAL/PLATELET
BASOS ABS: 0 10*3/uL (ref 0.0–0.1)
BASOS PCT: 0 %
EOS PCT: 1 %
Eosinophils Absolute: 0.1 10*3/uL (ref 0.0–0.7)
HCT: 30.7 % — ABNORMAL LOW (ref 39.0–52.0)
Hemoglobin: 10 g/dL — ABNORMAL LOW (ref 13.0–17.0)
Lymphocytes Relative: 9 %
Lymphs Abs: 1.4 10*3/uL (ref 0.7–4.0)
MCH: 30.8 pg (ref 26.0–34.0)
MCHC: 32.6 g/dL (ref 30.0–36.0)
MCV: 94.5 fL (ref 78.0–100.0)
MONO ABS: 1.5 10*3/uL — AB (ref 0.1–1.0)
Monocytes Relative: 10 %
Neutro Abs: 12.5 10*3/uL — ABNORMAL HIGH (ref 1.7–7.7)
Neutrophils Relative %: 80 %
PLATELETS: 114 10*3/uL — AB (ref 150–400)
RBC: 3.25 MIL/uL — ABNORMAL LOW (ref 4.22–5.81)
RDW: 16.5 % — AB (ref 11.5–15.5)
WBC: 15.6 10*3/uL — ABNORMAL HIGH (ref 4.0–10.5)

## 2016-06-07 LAB — BASIC METABOLIC PANEL
Anion gap: 3 — ABNORMAL LOW (ref 5–15)
BUN: 35 mg/dL — ABNORMAL HIGH (ref 6–20)
CALCIUM: 8 mg/dL — AB (ref 8.9–10.3)
CO2: 25 mmol/L (ref 22–32)
CREATININE: 0.45 mg/dL — AB (ref 0.61–1.24)
Chloride: 116 mmol/L — ABNORMAL HIGH (ref 101–111)
GFR calc non Af Amer: >60 mL/min (ref >60)
Glucose, Bld: 103 mg/dL — ABNORMAL HIGH (ref 65–99)
Potassium: 4.8 mmol/L (ref 3.5–5.1)
SODIUM: 144 mmol/L (ref 135–145)

## 2016-06-07 LAB — TYPE AND SCREEN
ABO/RH(D): A POS
Antibody Screen: NEGATIVE
UNIT DIVISION: 0
UNIT DIVISION: 0

## 2016-06-07 LAB — GLUCOSE, CAPILLARY
GLUCOSE-CAPILLARY: 103 mg/dL — AB (ref 65–99)
GLUCOSE-CAPILLARY: 95 mg/dL (ref 65–99)
Glucose-Capillary: 99 mg/dL (ref 65–99)
Glucose-Capillary: 94 mg/dL (ref 65–99)
Glucose-Capillary: 119 mg/dL — ABNORMAL HIGH (ref 65–99)
Glucose-Capillary: 111 mg/dL — ABNORMAL HIGH (ref 65–99)

## 2016-06-07 LAB — MAGNESIUM: MAGNESIUM: 2.2 mg/dL (ref 1.7–2.4)
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
## 2016-06-07 NOTE — Progress Notes (Signed)
INFECTIOUS DISEASE PROGRESS NOTE  ID: Dean Graham is a 55 y.o. male with  Principal Problem:   Septic shock ([place]) Active Problems:   HCAP (healthcare-associated pneumonia)   Pressure injury of skin   Quadriplegia ([place])   Atrial fibrillation ([place])   Diabetes mellitus without complication ([place])   Chronic respiratory failure ([place])   Pressure ulcer stage IV ([employer])   Encounter for wound care   Protein-calorie malnutrition, severe ([place])   Palliative care encounter   Goals of care, counseling/discussion   Chronic post traumatic encephalopathy   Acute encephalopathy   Hypokalemia   Hypomagnesemia   Urinary tract infection, site not specified   Acute on chronic respiratory failure with hypoxia ([place])   Dysphagia   Decubitus ulcer of sacral area  Subjective: No repsonse  Abtx:  Anti-infectives    Start     Dose/Rate Route Frequency Ordered Stop   05/30/16 1800  meropenem (MERREM) 1 g in sodium chloride 0.9 % 100 mL IVPB     1 g 200 mL/hr over 30 Minutes Intravenous Every 8 hours 05/30/16 1327     05/30/16 1200  levofloxacin (LEVAQUIN) IVPB 750 mg  Status:  Discontinued     750 mg 100 mL/hr over 90 Minutes Intravenous Every 24 hours 05/29/16 1211 05/29/16 1705   05/30/16 0300  imipenem-cilastatin (PRIMAXIN) 500 mg in sodium chloride 0.9 % 100 mL IVPB  Status:  Discontinued     500 mg 200 mL/hr over 30 Minutes Intravenous Every 8 hours 05/29/16 1943 05/30/16 1327   05/29/16 2100  aztreonam (AZACTAM) 1 g in dextrose 5 % 50 mL IVPB  Status:  Discontinued     1 g 100 mL/hr over 30 Minutes Intravenous Every 8 hours 05/29/16 1211 05/29/16 1518   05/29/16 1800  imipenem-cilastatin (PRIMAXIN) 500 mg in sodium chloride 0.9 % 100 mL IVPB  Status:  Discontinued     500 mg 200 mL/hr over 30 Minutes Intravenous Every 6 hours 05/29/16 1721 05/29/16 1943   05/29/16 1400  linezolid (ZYVOX) IVPB 600 mg  Status:  Discontinued     600 mg 300 mL/hr over 60 Minutes Intravenous Every 12 hours  05/29/16 1320 06/04/16 1744   05/29/16 1045  levofloxacin (LEVAQUIN) IVPB 750 mg     750 mg 100 mL/hr over 90 Minutes Intravenous  Once 05/29/16 1031 05/29/16 1235   05/29/16 1045  aztreonam (AZACTAM) 2 g in dextrose 5 % 50 mL IVPB     2 g 100 mL/hr over 30 Minutes Intravenous  Once 05/29/16 1031 05/29/16 1330      Medications:  Scheduled: . amiodarone  100 mg Oral Daily  . aspirin  81 mg Per Tube Daily  . chlorhexidine gluconate (MEDLINE KIT)  15 mL Mouth Rinse BID  . famotidine  20 mg Per Tube BID  . heparin  5,000 Units Subcutaneous Q8H  . insulin aspart  0-9 Units Subcutaneous Q4H  . lacosamide  100 mg Oral BID  . levETIRAcetam  500 mg Per Tube BID  . mouth rinse  15 mL Mouth Rinse QID  . meropenem (MERREM) IV  1 g Intravenous Q8H  . midodrine  10 mg Oral TID WC    Objective: Vital signs in last 24 hours: Temp:  [97.2 F (36.2 C)-99 F (37.2 C)] 98.4 F (36.9 C) (09/28 0728) Pulse Rate:  [107-121] 110 (09/28 0728) Resp:  [9-29] 19 (09/28 0728) BP: (75-113)/(60-83) 113/83 (09/28 0728) SpO2:  [98 %-100 %] 100 % (09/28 0728) FiO2 (%):  [  30 %] 30 % (09/28 0728) Weight:  [71.7 kg (158 lb)] 71.7 kg (158 lb) (09/28 0402)   General appearance: no distress Resp: rhonchi bilaterally Cardio: regular rate and rhythm GI: normal findings: bowel sounds normal  Lab Results  Recent Labs  06/06/16 0428  06/06/16 1216 06/07/16 0410  WBC 15.2*  --   --  15.6*  HGB 7.0*  --  5.7* 10.0*  HCT 23.1*  --  18.3* 30.7*  NA  --   < > 146* 144  K  --   < > 3.6 4.8  CL  --   < > 117* 116*  CO2  --   < > 24 25  BUN  --   < > 33* 35*  CREATININE  --   < > 0.43* 0.45*  < > = values in this interval not displayed. Liver Panel  Recent Labs  06/05/16 0910 06/06/16 0630  PROT 4.7* 4.9*  ALBUMIN 1.2* 2.3*  AST 66* 74*  ALT 60 48  ALKPHOS 165* 122  BILITOT 0.8 0.8   Sedimentation Rate No results for input(s): ESRSEDRATE in the last 72 hours. C-Reactive Protein No results for  input(s): CRP in the last 72 hours.  Microbiology: Recent Results (from the past 240 hour(s))  Blood Culture (routine x 2)     Status: None   Collection Time: 05/29/16 10:15 AM  Result Value Ref Range Status   Specimen Description BLOOD RIGHT ANTECUBITAL  Final   Special Requests BOTTLES DRAWN AEROBIC AND ANAEROBIC 5CC  Final   Culture NO GROWTH 5 DAYS  Final   Report Status 06/03/2016 FINAL  Final  Blood Culture (routine x 2)     Status: None   Collection Time: 05/29/16 10:25 AM  Result Value Ref Range Status   Specimen Description BLOOD RIGHT HAND  Final   Special Requests BOTTLES DRAWN AEROBIC AND ANAEROBIC 5CC  Final   Culture NO GROWTH 5 DAYS  Final   Report Status 06/03/2016 FINAL  Final  Urine culture     Status: Abnormal   Collection Time: 05/29/16 12:15 PM  Result Value Ref Range Status   Specimen Description URINE, RANDOM  Final   Special Requests NONE  Final   Culture MULTIPLE SPECIES PRESENT, SUGGEST RECOLLECTION (A)  Final   Report Status 05/30/2016 FINAL  Final  MRSA PCR Screening     Status: None   Collection Time: 05/30/16  2:52 AM  Result Value Ref Range Status   MRSA by PCR NEGATIVE NEGATIVE Final    Comment:        The GeneXpert MRSA Assay (FDA approved for NASAL specimens only), is one component of a comprehensive MRSA colonization surveillance program. It is not intended to diagnose MRSA infection nor to guide or monitor treatment for MRSA infections.   Culture, Urine     Status: None   Collection Time: 05/31/16  3:20 PM  Result Value Ref Range Status   Specimen Description URINE, CATHETERIZED  Final   Special Requests NONE  Final   Culture NO GROWTH  Final   Report Status 06/01/2016 FINAL  Final  Culture, blood (routine x 2)     Status: None (Preliminary result)   Collection Time: 06/04/16  5:42 PM  Result Value Ref Range Status   Specimen Description BLOOD LEFT HAND  Final   Special Requests IN PEDIATRIC BOTTLE  3CC  Final   Culture NO GROWTH  2 DAYS  Final   Report Status PENDING  Incomplete  Culture, blood (routine x 2)     Status: None (Preliminary result)   Collection Time: 06/04/16  5:50 PM  Result Value Ref Range Status   Specimen Description BLOOD LEFT HAND  Final   Special Requests BOTTLES DRAWN AEROBIC ONLY  5CC  Final   Culture NO GROWTH 2 DAYS  Final   Report Status PENDING  Incomplete  C difficile quick scan w PCR reflex     Status: None   Collection Time: 06/04/16  6:30 PM  Result Value Ref Range Status   C Diff antigen NEGATIVE NEGATIVE Final   C Diff toxin NEGATIVE NEGATIVE Final   C Diff interpretation No C. difficile detected.  Final  Urine culture     Status: None   Collection Time: 06/04/16  8:33 PM  Result Value Ref Range Status   Specimen Description URINE, CATHETERIZED  Final   Special Requests NONE  Final   Culture NO GROWTH  Final   Report Status 06/06/2016 FINAL  Final  Culture, respiratory (NON-Expectorated)     Status: None (Preliminary result)   Collection Time: 06/04/16 11:28 PM  Result Value Ref Range Status   Specimen Description TRACHEAL ASPIRATE  Final   Special Requests NONE  Final   Gram Stain   Final    RARE SQUAMOUS EPITHELIAL CELLS PRESENT FEW WBC PRESENT,BOTH PMN AND MONONUCLEAR MODERATE GRAM NEGATIVE RODS    Culture   Final    ABUNDANT ACINETOBACTER CALCOACETICUS/BAUMANNII COMPLEX SUSCEPTIBILITIES TO FOLLOW    Report Status PENDING  Incomplete    Studies/Results: US Scrotum  Result Date: 06/06/2016 CLINICAL DATA:  Edema x3 days, history of quadriplegia EXAM: ULTRASOUND OF SCROTUM TECHNIQUE: Complete ultrasound examination of the testicles, epididymis, and other scrotal structures was performed. COMPARISON:  None. FINDINGS: Right testicle Measurements: 3.4 x 2.0 x 2.1 cm. Heterogeneous parenchyma without focal mass. No microlithiasis visualized. Left testicle Measurements: 3.3 x 2.3 x 2.9 cm. Heterogeneous parenchyma without focal mass. No microlithiasis visualized. Right  epididymis:  7 x 5 x 7 mm epididymal cyst. Left epididymis:  Normal in size and appearance. Hydrocele:  None visualized. Varicocele:  None visualized. Additional comments: Moderate scrotal wall edema. No discrete fluid collection/abscess. IMPRESSION: Moderate scrotal wall edema.  No discrete fluid collection/abscess. Electronically Signed   By: Julian Hy M.D.   On: 06/06/2016 17:03     Assessment/Plan: HCAP Prev UTI- proteus  Acute on Chronic respiratory failure Fluid overload Decubitus ulcers  Total days of antibiotics:  Meropenem 9/20 >> Linezolid 9/19 >>9/25 Aztreonam 9/19 >> 9/19 Levaquin 9/19 >>9/19  Right midline PICC 9/19 >>   C diff (-) Await resp Cx sensitivities (A baumanii). He is on carbapenem Await BCx Afebrile, WBC stable Appreciate continued palliative care f/u. Agree with EOL care.   Will continue to watch for now        [name] Infectious Diseases (pager) [phone] [email or web address] 06/07/2016, 9:12 AM  LOS: 9 days

## 2016-06-07 NOTE — Progress Notes (Signed)
PROGRESS NOTE    Dean Graham  OYD:[id number] DOB: 03-Feb-1961 DOA: 05/29/2016 PCP: Verneita Griffes, MD   Brief Narrative:  55 y.o.BM SNF resident PMHx TBI, Quadriplegia , Chronic Trach-dependent respiratory failure, PEG tube, Neurogenic bladder with Chronic foley, Atrial fibrillation , Diastolic CHF, Anemia of chronic disease,Diabetes mellitus Type 2 without complication  Who was sent from [facility] 9/19 with fever, hypotension and increased work of breathing.  In ED, pt was febrile with T max of 100.7 F, hypotensive with BP of 72/60 without improvement after 4L IV fluids. He is white blood cell count was 23. Chest x-ray showed bilateral pleural effusions with pneumonia versus atelectasis. Patient was admitted to ICU for possible need for pressor support. Patient was transferred to stepdown unit 05/30/2016. On meropenem and Linezolid. Now with increasing white count   Subjective: On vent, non verbal. , not following command.  Watery diarrhea continue.    Assessment & Plan:   Principal Problem:   Septic shock ([place]) Active Problems:   HCAP (healthcare-associated pneumonia)   Pressure injury of skin   Quadriplegia ([facility])   Atrial fibrillation ([facility])   Diabetes mellitus without complication ([place])   Chronic respiratory failure ([place])   Pressure ulcer stage IV ([place])   Encounter for wound care   Protein-calorie malnutrition, severe ([place])   Palliative care encounter   Goals of care, counseling/discussion   Chronic post traumatic encephalopathy   Acute encephalopathy   Hypokalemia   Hypomagnesemia   Urinary tract infection, site not specified   Acute on chronic respiratory failure with hypoxia ([facility])   Dysphagia   Decubitus ulcer of sacral area   1-Septic shock/HCAP/ UTI  -Pt with chronic indwelling Foley catheter  - blood cx negative. -Patient remains tachycardic and hypotensive with increasing white count. Continue antibiotics per ID -Chest x-ray on admission showed bilateral  pleural effusions with pneumonia vs atelectasis -has recived  Albumin 50 g 2 - Continue midodrine 10 mg TID -C diff negative. Respiratory culture: pending. Blood culture 9-25 no growth to date.  -IV  maintenance fluids at 50 cc /hr.  --[place] negative for abscess.   Mild troponin elevation - Due to demand ischemic from septic shock - Troponin 0.09 --> 0.07  - No further work up needed at this time  -ECHO pending.   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 Improved.   Hyperkalemia / Hypokalemia  - Repeat labs this afternoon.   Hypomagnesemia -Magnesium goal> 2 - Hypocalcemia -Corrected calcium= 10.1   Acute on Chronic respiratory failure with hypoxia, trach-dependent - on vent   Chronic diastolic CHF -Echocardiogram pending - Holding lasix due to soft BP, 111/80  Chronic atrial fibrillation(CHADS vasc score 2) - On amiodarone and aspirin   Dysphagia, PEG tube-dependent / Severe protein calorie malnutrition  - Chronic, stable  - Continue tube feeds   Quadriplegia - Stable   Neurogenic bladder - Chronic, stable - Has Foley catheter in place  Anemia of chronic disease - Baseline hemoglobin around 8 - S/P 2 unit PRBC transfusion on 9-27 -hb drop to 5, increase to 10/ after transfusion   Seizure disorder - Continue keppra, vimpat  Stage 4 sacral pressure ulcer, stage 3 right and left hip sacral ulcer - Appreciate wound care assessment - Stage 4Sacrum 10cm x 8cm x 0.2cm with 100% red gran tissue, mod amtdrainage  green, mild odor - Healing stage 3 righthip 2.5cm x 2 cm 100 % red granulating, no drainage - Left hip Stage 3 ulcer 4.5cm x 7cm x 1cm, 100% red granulating, modamt green drainage, mild odor  -Left great toe full thickness 0.8cm x 0.8cm 100% red  gran tissue, no drainage or odor - L flank MARSI 0.5cm x 0.3cm pink and dry - L knee full thickness 2.5cm x 2cm red and moistno drainage or odor noted - L heel Stage 2 0.3cm x 0.3cm pink and moist no drainage or odor - Per WOC, apply quacell for sacrum and left hip to absorb drainage and provide antimicrobial benifitsand allevyn foam to the others -Placed on a mattress and ensure on rotation at all times   Goals of care Palliative care consulted.      DVT prophylaxis: Subcutaneous heparin  Code Status: DO NOT RESUSCITATE Family Communication: None at bedside. palliative meeting with family for goals of care.  Disposition Plan: Await further palliative care recommendation   Consultants:  [name] ID Dr.Daniel Lily Kocher [facility]    Procedures/Significant Events:  None   Cultures 9/19 blood right AC/hand negative 9/19 urine positive multiple species 9/20 MRSA by PCR negative 9/21 urine negative 9/25 blood left hand 2 NGTD 9/25 urine pending 9/25 tracheal aspirate positive GNR    Antimicrobials: Meropenem 9/20 >> Linezolid 9/19 >> 9/25 Aztreonam 9/19 >> 9/19 Levaquin 9/19 >>9/19   Devices None   LINES / TUBES:  Right midline PICC 9/19 >>    Continuous Infusions: . sodium chloride 250 mL (06/06/16 2143)  . dextrose 50 mL/hr at 06/06/16 0909  . feeding supplement (VITAL AF 1.2 CAL) 1,000 mL (06/06/16 2156)     Objective: Vitals:   06/07/16 0302 06/07/16 0400 06/07/16 0402 06/07/16 0728  BP:  104/78  113/83  Pulse: (!) 111 (!) 108  (!) 110  Resp: (!) 22 (!) 21  19  Temp:  98.1 F (36.7 C)  98.4 F (36.9 C)  TempSrc:  Oral  Oral  SpO2: 100% 100%  100%  Weight:   71.7 kg (158 lb)   Height:        Intake/Output Summary (Last 24 hours) at 06/07/16 0823 Last data filed at 06/07/16 0600  Gross per 24 hour  Intake           4453.5 ml  Output             1650 ml  Net           2803.5 ml   Filed Weights   06/05/16 0446 06/06/16 0337  06/07/16 0402  Weight: 69.5 kg (153 lb 3.5 oz) 71.7 kg (158 lb) 71.7 kg (158 lb)    Examination:  General: A/O 0, positive chronic respiratory distress (on vent) Eyes: negative scleral hemorrhage, negative anisocoria, negative icterus, right eye swollen ENT: Negative Runny nose, negative gingival bleeding, Neck:  Negative scars, masses, torticollis, lymphadenopathy, JVD Lungs: coarse breath sounds diffusely, negative wheezes or crackles Cardiovascular: Regular rate and rhythm without murmur gallop or rub normal S1 and S2 Abdomen: negative abdominal pain, nondistended, positive soft, bowel sounds, no rebound, no ascites, no appreciable mass, PEG site covered and clean negative sign of infection Extremities: No significant cyanosis, clubbing. Plus bilateral lower extremity edema 2+ Skin: Multiple hip/sacral ulcers stage 3/4 (did not review today).  Psychiatric:  Unable to evaluate  Central nervous system:  Grimaces to painful stimuli: Unable to complete evaluation secondary to altered mental status (unsure of  baseline)   .     CBC:  Recent Labs Lab 06/03/16 0310 06/04/16 0930 06/05/16 0515 06/06/16 0428 06/06/16 1216 06/07/16 0410  WBC 20.1* 23.5* 22.4* 15.2*  --  15.6*  NEUTROABS  --  20.5* 19.7* 12.8*  --  12.5*  HGB 9.0* 8.9* 8.8* 7.0* 5.7* 10.0*  HCT 28.2* 28.3* 28.0* 23.1* 18.3* 30.7*  MCV 97.2 97.9 97.9 99.6  --  94.5  PLT 210 178 196 161  --  818*   Basic Metabolic Panel:  Recent Labs Lab 05/31/16 1513 06/01/16 0545 06/01/16 1700 06/02/16 0500  06/03/16 0310 06/05/16 0910 06/06/16 0630 06/06/16 1216 06/07/16 0410  NA  --   --   --   --   < > 143 144 147* 146* 144  K  --   --   --   --   < > 3.9 4.0 5.2* 3.6 4.8  CL  --   --   --   --   < > 114* 114* 114* 117* 116*  CO2  --   --   --   --   < > _0 GLUCOSE  --   --   --   --   < > 105* 113* 100* 108* 103*  BUN  --   --   --   --   < > 35* 33* 34* 33* 35*  CREATININE  --   --   --   --   < >  0.58* 0.52* 0.46* 0.43* 0.45*  CALCIUM  --   --   --   --   < > 7.7* 7.7* 8.1* 7.8* 8.0*  MG 2.0 1.7 1.7 1.9  --   --  1.6* 2.1  --  2.2  PHOS 4.7* 3.1 2.6 2.5  --   --   --   --   --   --   < > = values in this interval not displayed. GFR: Estimated Creatinine Clearance: 103.3 mL/min (by C-G formula based on SCr of 0.45 mg/dL (L)). Liver Function Tests:  Recent Labs Lab 06/05/16 0910 06/06/16 0630  AST 66* 74*  ALT 60 48  ALKPHOS 165* 122  BILITOT 0.8 0.8  PROT 4.7* 4.9*  ALBUMIN 1.2* 2.3*   No results for input(s): LIPASE, AMYLASE in the last 168 hours. No results for input(s): AMMONIA in the last 168 hours. Coagulation Profile: No results for input(s): INR, PROTIME in the last 168 hours. Cardiac Enzymes: No results for input(s): CKTOTAL, CKMB, CKMBINDEX, TROPONINI in the last 168 hours. BNP (last 3 results) No results for input(s): PROBNP in the last 8760 hours. HbA1C: No results for input(s): HGBA1C in the last 72 hours. CBG:  Recent Labs Lab 06/06/16 1548 06/06/16 1922 06/06/16 2302 06/07/16 0358 06/07/16 0734  GLUCAP 113* 106* 90 99 103*   Lipid Profile: No results for input(s): CHOL, HDL, LDLCALC, TRIG, CHOLHDL, LDLDIRECT in the last 72 hours. Thyroid Function Tests: No results for input(s): TSH, T4TOTAL, FREET4, T3FREE, THYROIDAB in the last 72 hours. Anemia Panel: No results for input(s): VITAMINB12, FOLATE, FERRITIN, TIBC, IRON, RETICCTPCT in the last 72 hours. Urine analysis:    Component Value Date/Time   COLORURINE YELLOW 06/04/2016 2034   APPEARANCEUR CLEAR 06/04/2016 2034   LABSPEC 1.027 06/04/2016 2034   PHURINE 6.0 06/04/2016 2034   GLUCOSEU NEGATIVE 06/04/2016 2034   HGBUR NEGATIVE 06/04/2016 2034   BILIRUBINUR NEGATIVE 06/04/2016 2034   [place] NEGATIVE 06/04/2016 2034  PROTEINUR NEGATIVE 06/04/2016 2034   NITRITE NEGATIVE 06/04/2016 2034   LEUKOCYTESUR NEGATIVE 06/04/2016 2034   Sepsis  Labs: _0 (procalcitonin:4,lacticidven:4)  ) Recent Results (from the past 240 hour(s))  Blood Culture (routine x 2)     Status: None   Collection Time: 05/29/16 10:15 AM  Result Value Ref Range Status   Specimen Description BLOOD RIGHT ANTECUBITAL  Final   Special Requests BOTTLES DRAWN AEROBIC AND ANAEROBIC 5CC  Final   Culture NO GROWTH 5 DAYS  Final   Report Status 06/03/2016 FINAL  Final  Blood Culture (routine x 2)     Status: None   Collection Time: 05/29/16 10:25 AM  Result Value Ref Range Status   Specimen Description BLOOD RIGHT HAND  Final   Special Requests BOTTLES DRAWN AEROBIC AND ANAEROBIC 5CC  Final   Culture NO GROWTH 5 DAYS  Final   Report Status 06/03/2016 FINAL  Final  Urine culture     Status: Abnormal   Collection Time: 05/29/16 12:15 PM  Result Value Ref Range Status   Specimen Description URINE, RANDOM  Final   Special Requests NONE  Final   Culture MULTIPLE SPECIES PRESENT, SUGGEST RECOLLECTION (A)  Final   Report Status 05/30/2016 FINAL  Final  MRSA PCR Screening     Status: None   Collection Time: 05/30/16  2:52 AM  Result Value Ref Range Status   MRSA by PCR NEGATIVE NEGATIVE Final    Comment:        The GeneXpert MRSA Assay (FDA approved for NASAL specimens only), is one component of a comprehensive MRSA colonization surveillance program. It is not intended to diagnose MRSA infection nor to guide or monitor treatment for MRSA infections.   Culture, Urine     Status: None   Collection Time: 05/31/16  3:20 PM  Result Value Ref Range Status   Specimen Description URINE, CATHETERIZED  Final   Special Requests NONE  Final   Culture NO GROWTH  Final   Report Status 06/01/2016 FINAL  Final  Culture, blood (routine x 2)     Status: None (Preliminary result)   Collection Time: 06/04/16  5:42 PM  Result Value Ref Range Status   Specimen Description BLOOD LEFT HAND  Final   Special Requests IN PEDIATRIC BOTTLE  3CC  Final   Culture NO  GROWTH 2 DAYS  Final   Report Status PENDING  Incomplete  Culture, blood (routine x 2)     Status: None (Preliminary result)   Collection Time: 06/04/16  5:50 PM  Result Value Ref Range Status   Specimen Description BLOOD LEFT HAND  Final   Special Requests BOTTLES DRAWN AEROBIC ONLY  5CC  Final   Culture NO GROWTH 2 DAYS  Final   Report Status PENDING  Incomplete  C difficile quick scan w PCR reflex     Status: None   Collection Time: 06/04/16  6:30 PM  Result Value Ref Range Status   C Diff antigen NEGATIVE NEGATIVE Final   C Diff toxin NEGATIVE NEGATIVE Final   C Diff interpretation No C. difficile detected.  Final  Urine culture     Status: None   Collection Time: 06/04/16  8:33 PM  Result Value Ref Range Status   Specimen Description URINE, CATHETERIZED  Final   Special Requests NONE  Final   Culture NO GROWTH  Final   Report Status 06/06/2016 FINAL  Final  Culture, respiratory (NON-Expectorated)     Status: None (Preliminary result)   Collection  Time: 06/04/16 11:28 PM  Result Value Ref Range Status   Specimen Description TRACHEAL ASPIRATE  Final   Special Requests NONE  Final   Gram Stain   Final    RARE SQUAMOUS EPITHELIAL CELLS PRESENT FEW WBC PRESENT,BOTH PMN AND MONONUCLEAR MODERATE GRAM NEGATIVE RODS    Culture   Final    ABUNDANT ACINETOBACTER CALCOACETICUS/BAUMANNII COMPLEX SUSCEPTIBILITIES TO FOLLOW    Report Status PENDING  Incomplete         Radiology Studies: US Scrotum  Result Date: 06/06/2016 CLINICAL DATA:  Edema x3 days, history of quadriplegia EXAM: ULTRASOUND OF SCROTUM TECHNIQUE: Complete ultrasound examination of the testicles, epididymis, and other scrotal structures was performed. COMPARISON:  None. FINDINGS: Right testicle Measurements: 3.4 x 2.0 x 2.1 cm. Heterogeneous parenchyma without focal mass. No microlithiasis visualized. Left testicle Measurements: 3.3 x 2.3 x 2.9 cm. Heterogeneous parenchyma without focal mass. No microlithiasis  visualized. Right epididymis:  7 x 5 x 7 mm epididymal cyst. Left epididymis:  Normal in size and appearance. Hydrocele:  None visualized. Varicocele:  None visualized. Additional comments: Moderate scrotal wall edema. No discrete fluid collection/abscess. IMPRESSION: Moderate scrotal wall edema.  No discrete fluid collection/abscess. Electronically Signed   By: Julian Hy M.D.   On: 06/06/2016 17:03        Scheduled Meds: . amiodarone  100 mg Oral Daily  . aspirin  81 mg Per Tube Daily  . chlorhexidine gluconate (MEDLINE KIT)  15 mL Mouth Rinse BID  . famotidine  20 mg Per Tube BID  . heparin  5,000 Units Subcutaneous Q8H  . insulin aspart  0-9 Units Subcutaneous Q4H  . lacosamide  100 mg Oral BID  . levETIRAcetam  500 mg Per Tube BID  . mouth rinse  15 mL Mouth Rinse QID  . meropenem (MERREM) IV  1 g Intravenous Q8H  . midodrine  10 mg Oral TID WC   Continuous Infusions: . sodium chloride 250 mL (06/06/16 2143)  . dextrose 50 mL/hr at 06/06/16 0909  . feeding supplement (VITAL AF 1.2 CAL) 1,000 mL (06/06/16 2156)     LOS: 9 days    Time spent: 35 minutes    Elmarie Shiley, MD [facility] Pager [phone]  If 7PM-7AM, please contact night-coverage [email or web address] Password [facility] 06/07/2016, 8:23 AM

## 2016-06-08 ENCOUNTER — Inpatient Hospital Stay (HOSPITAL_COMMUNITY): Payer: Medicare Other

## 2016-06-08 DIAGNOSIS — Y95 Nosocomial condition: Secondary | ICD-10-CM | POA: Diagnosis not present

## 2016-06-08 DIAGNOSIS — R509 Fever, unspecified: Secondary | ICD-10-CM | POA: Diagnosis not present

## 2016-06-08 DIAGNOSIS — I509 Heart failure, unspecified: Secondary | ICD-10-CM | POA: Diagnosis not present

## 2016-06-08 DIAGNOSIS — J9621 Acute and chronic respiratory failure with hypoxia: Secondary | ICD-10-CM | POA: Diagnosis not present

## 2016-06-08 DIAGNOSIS — R6521 Severe sepsis with septic shock: Secondary | ICD-10-CM | POA: Diagnosis not present

## 2016-06-08 DIAGNOSIS — J189 Pneumonia, unspecified organism: Secondary | ICD-10-CM | POA: Diagnosis not present

## 2016-06-08 DIAGNOSIS — A419 Sepsis, unspecified organism: Secondary | ICD-10-CM | POA: Diagnosis not present

## 2016-06-08 DIAGNOSIS — E8770 Fluid overload, unspecified: Secondary | ICD-10-CM | POA: Diagnosis not present

## 2016-06-08 LAB — CBC WITH DIFFERENTIAL/PLATELET
BASOS PCT: 0 %
Basophils Absolute: 0 10*3/uL (ref 0.0–0.1)
Eosinophils Absolute: 0.1 10*3/uL (ref 0.0–0.7)
Eosinophils Relative: 0 %
HEMATOCRIT: 33 % — AB (ref 39.0–52.0)
Hemoglobin: 10.8 g/dL — ABNORMAL LOW (ref 13.0–17.0)
Lymphocytes Relative: 7 %
Lymphs Abs: 1.3 10*3/uL (ref 0.7–4.0)
MCH: 30.8 pg (ref 26.0–34.0)
MCHC: 32.7 g/dL (ref 30.0–36.0)
MCV: 94 fL (ref 78.0–100.0)
MONO ABS: 2.1 10*3/uL — AB (ref 0.1–1.0)
MONOS PCT: 11 %
NEUTROS ABS: 15.3 10*3/uL — AB (ref 1.7–7.7)
Neutrophils Relative %: 82 %
Platelets: 105 10*3/uL — ABNORMAL LOW (ref 150–400)
RBC: 3.51 MIL/uL — ABNORMAL LOW (ref 4.22–5.81)
RDW: 16.7 % — AB (ref 11.5–15.5)
WBC: 18.7 10*3/uL — ABNORMAL HIGH (ref 4.0–10.5)

## 2016-06-08 LAB — BASIC METABOLIC PANEL
Anion gap: 3 — ABNORMAL LOW (ref 5–15)
BUN: 32 mg/dL — ABNORMAL HIGH (ref 6–20)
CHLORIDE: 114 mmol/L — AB (ref 101–111)
CO2: 26 mmol/L (ref 22–32)
Calcium: 8.4 mg/dL — ABNORMAL LOW (ref 8.9–10.3)
Creatinine, Ser: 0.4 mg/dL — ABNORMAL LOW (ref 0.61–1.24)
GFR calc Af Amer: >60 mL/min (ref >60)
Glucose, Bld: 88 mg/dL (ref 65–99)
Potassium: 4.4 mmol/L (ref 3.5–5.1)
SODIUM: 143 mmol/L (ref 135–145)

## 2016-06-08 LAB — ECHOCARDIOGRAM COMPLETE
HEIGHTINCHES: 70 in
Weight: 2576 oz

## 2016-06-08 LAB — GLUCOSE, CAPILLARY
GLUCOSE-CAPILLARY: 88 mg/dL (ref 65–99)
GLUCOSE-CAPILLARY: 92 mg/dL (ref 65–99)
GLUCOSE-CAPILLARY: 95 mg/dL (ref 65–99)
Glucose-Capillary: 106 mg/dL — ABNORMAL HIGH (ref 65–99)
Glucose-Capillary: 107 mg/dL — ABNORMAL HIGH (ref 65–99)

## 2016-06-08 LAB — MAGNESIUM: Magnesium: 2.1 mg/dL (ref 1.7–2.4)

## 2016-06-08 MED ORDER — FUROSEMIDE 10 MG/ML IJ SOLN
40.0000 mg | Freq: Once | INTRAMUSCULAR | Status: AC
Start: 2016-06-08 — End: 2016-06-08
  Administered 2016-06-08: 40 mg via INTRAVENOUS
  Filled 2016-06-08: qty 4

## 2016-06-08 MED ORDER — GUAIFENESIN ER 600 MG PO TB12: 1200.0000 mg | ORAL_TABLET | Freq: Two times a day (BID) | ORAL | Status: DC

## 2016-06-08 MED ORDER — FREE WATER
200.0000 mL | Freq: Three times a day (TID) | Status: DC
  Administered 2016-06-08 – 2016-06-18 (×32): 200 mL

## 2016-06-08 MED ORDER — GUAIFENESIN 100 MG/5ML PO SOLN
5.0000 mL | ORAL | Status: DC | PRN
  Filled 2016-06-08: qty 5

## 2016-06-08 NOTE — Care Management Note (Signed)
Case Management Note  Patient Details  Name: Dean Graham MRN: [id number] Date of Birth: 04/20/1961  Subjective/Objective:  CM met with pt's brother, [name], to discuss placement options.  [name] wants patient to transfer to [facility] in [place].  CM called [facility] and learned that the facility is an LTAC and previous check revealed that patient has no LTAC days left.  Discussed two vent skilled nursing facilities in [place] and [name] plans to determine if they are any closer for family than [facility].  If not, [name] wants pt to transfer back to [facility].                          Expected Discharge Plan:  Skilled Nursing Facility  In-House Referral:  Clinical Social Work  Discharge planning Services  CM Consult  Status of Service:  Completed, signed off  Mayo, Henrietta T, RN 06/08/2016, 3:12 PM  

## 2016-06-08 NOTE — Progress Notes (Signed)
INFECTIOUS DISEASE PROGRESS NOTE  ID: Dean Graham is a 55 y.o. male with  Principal Problem:   Septic shock ([place]) Active Problems:   HCAP (healthcare-associated pneumonia)   Pressure injury of skin   Quadriplegia ([place])   Atrial fibrillation ([place])   Diabetes mellitus without complication ([place])   Chronic respiratory failure ([place])   Pressure ulcer stage IV ([place])   Encounter for wound care   Protein-calorie malnutrition, severe ([place])   Palliative care encounter   Goals of care, counseling/discussion   Chronic post traumatic encephalopathy   Acute encephalopathy   Hypokalemia   Hypomagnesemia   Urinary tract infection, site not specified   Acute on chronic respiratory failure with hypoxia ([place])   Dysphagia   Decubitus ulcer of sacral area  Subjective: No response  Abtx:  Anti-infectives    Start     Dose/Rate Route Frequency Ordered Stop   05/30/16 1800  meropenem (MERREM) 1 g in sodium chloride 0.9 % 100 mL IVPB     1 g 200 mL/hr over 30 Minutes Intravenous Every 8 hours 05/30/16 1327     05/30/16 1200  levofloxacin (LEVAQUIN) IVPB 750 mg  Status:  Discontinued     750 mg 100 mL/hr over 90 Minutes Intravenous Every 24 hours 05/29/16 1211 05/29/16 1705   05/30/16 0300  imipenem-cilastatin (PRIMAXIN) 500 mg in sodium chloride 0.9 % 100 mL IVPB  Status:  Discontinued     500 mg 200 mL/hr over 30 Minutes Intravenous Every 8 hours 05/29/16 1943 05/30/16 1327   05/29/16 2100  aztreonam (AZACTAM) 1 g in dextrose 5 % 50 mL IVPB  Status:  Discontinued     1 g 100 mL/hr over 30 Minutes Intravenous Every 8 hours 05/29/16 1211 05/29/16 1518   05/29/16 1800  imipenem-cilastatin (PRIMAXIN) 500 mg in sodium chloride 0.9 % 100 mL IVPB  Status:  Discontinued     500 mg 200 mL/hr over 30 Minutes Intravenous Every 6 hours 05/29/16 1721 05/29/16 1943   05/29/16 1400  linezolid (ZYVOX) IVPB 600 mg  Status:  Discontinued     600 mg 300 mL/hr over 60 Minutes Intravenous Every 12 hours  05/29/16 1320 06/04/16 1744   05/29/16 1045  levofloxacin (LEVAQUIN) IVPB 750 mg     750 mg 100 mL/hr over 90 Minutes Intravenous  Once 05/29/16 1031 05/29/16 1235   05/29/16 1045  aztreonam (AZACTAM) 2 g in dextrose 5 % 50 mL IVPB     2 g 100 mL/hr over 30 Minutes Intravenous  Once 05/29/16 1031 05/29/16 1330      Medications:  Scheduled: . amiodarone  100 mg Oral Daily  . aspirin  81 mg Per Tube Daily  . chlorhexidine gluconate (MEDLINE KIT)  15 mL Mouth Rinse BID  . famotidine  20 mg Per Tube BID  . free water  200 mL Per Tube Q8H  . guaiFENesin  1,200 mg Oral BID  . heparin  5,000 Units Subcutaneous Q8H  . insulin aspart  0-9 Units Subcutaneous Q4H  . lacosamide  100 mg Oral BID  . levETIRAcetam  500 mg Per Tube BID  . mouth rinse  15 mL Mouth Rinse QID  . meropenem (MERREM) IV  1 g Intravenous Q8H  . midodrine  10 mg Oral TID WC    Objective: Vital signs in last 24 hours: Temp:  [97.1 F (36.2 C)-98.7 F (37.1 C)] 98.2 F (36.8 C) (09/29 0800) Pulse Rate:  [106-121] 110 (09/29 1152) Resp:  [16-28] 19 (09/29  1152) BP: (116-135)/(80-105) 120/88 (09/29 1152) SpO2:  [98 %-100 %] 100 % (09/29 1152) FiO2 (%):  [30 %] 30 % (09/29 1152) Weight:  [73 kg (161 lb)] 73 kg (161 lb) (09/29 0449)   General appearance: no distress Resp: rhonchi bilaterally Cardio: tachycardia GI: normal findings: bowel sounds normal  Lab Results  Recent Labs  06/07/16 0410 06/08/16 0450  WBC 15.6* 18.7*  HGB 10.0* 10.8*  HCT 30.7* 33.0*  NA 144 143  K 4.8 4.4  CL 116* 114*  CO2 25 26  BUN 35* 32*  CREATININE 0.45* 0.40*   Liver Panel  Recent Labs  06/06/16 0630  PROT 4.9*  ALBUMIN 2.3*  AST 74*  ALT 48  ALKPHOS 122  BILITOT 0.8   Sedimentation Rate No results for input(s): ESRSEDRATE in the last 72 hours. C-Reactive Protein No results for input(s): CRP in the last 72 hours.  Microbiology: Recent Results (from the past 240 hour(s))  MRSA PCR Screening     Status:  None   Collection Time: 05/30/16  2:52 AM  Result Value Ref Range Status   MRSA by PCR NEGATIVE NEGATIVE Final    Comment:        The GeneXpert MRSA Assay (FDA approved for NASAL specimens only), is one component of a comprehensive MRSA colonization surveillance program. It is not intended to diagnose MRSA infection nor to guide or monitor treatment for MRSA infections.   Culture, Urine     Status: None   Collection Time: 05/31/16  3:20 PM  Result Value Ref Range Status   Specimen Description URINE, CATHETERIZED  Final   Special Requests NONE  Final   Culture NO GROWTH  Final   Report Status 06/01/2016 FINAL  Final  Culture, blood (routine x 2)     Status: None (Preliminary result)   Collection Time: 06/04/16  5:42 PM  Result Value Ref Range Status   Specimen Description BLOOD LEFT HAND  Final   Special Requests IN PEDIATRIC BOTTLE  3CC  Final   Culture NO GROWTH 3 DAYS  Final   Report Status PENDING  Incomplete  Culture, blood (routine x 2)     Status: None (Preliminary result)   Collection Time: 06/04/16  5:50 PM  Result Value Ref Range Status   Specimen Description BLOOD LEFT HAND  Final   Special Requests BOTTLES DRAWN AEROBIC ONLY  5CC  Final   Culture NO GROWTH 3 DAYS  Final   Report Status PENDING  Incomplete  C difficile quick scan w PCR reflex     Status: None   Collection Time: 06/04/16  6:30 PM  Result Value Ref Range Status   C Diff antigen NEGATIVE NEGATIVE Final   C Diff toxin NEGATIVE NEGATIVE Final   C Diff interpretation No C. difficile detected.  Final  Urine culture     Status: None   Collection Time: 06/04/16  8:33 PM  Result Value Ref Range Status   Specimen Description URINE, CATHETERIZED  Final   Special Requests NONE  Final   Culture NO GROWTH  Final   Report Status 06/06/2016 FINAL  Final  Culture, respiratory (NON-Expectorated)     Status: None (Preliminary result)   Collection Time: 06/04/16 11:28 PM  Result Value Ref Range Status    Specimen Description TRACHEAL ASPIRATE  Final   Special Requests NONE  Final   Gram Stain   Final    RARE SQUAMOUS EPITHELIAL CELLS PRESENT FEW WBC PRESENT,BOTH PMN AND MONONUCLEAR MODERATE GRAM NEGATIVE  RODS    Culture   Final    ABUNDANT ACINETOBACTER CALCOACETICUS/BAUMANNII COMPLEX TRYING TO GET ORGANISM ISOLATED ENOUGH TO PERFORM SUSCEPTIBILTY TESTING    Report Status PENDING  Incomplete    Studies/Results: US Scrotum  Result Date: 06/06/2016 CLINICAL DATA:  Edema x3 days, history of quadriplegia EXAM: ULTRASOUND OF SCROTUM TECHNIQUE: Complete ultrasound examination of the testicles, epididymis, and other scrotal structures was performed. COMPARISON:  None. FINDINGS: Right testicle Measurements: 3.4 x 2.0 x 2.1 cm. Heterogeneous parenchyma without focal mass. No microlithiasis visualized. Left testicle Measurements: 3.3 x 2.3 x 2.9 cm. Heterogeneous parenchyma without focal mass. No microlithiasis visualized. Right epididymis:  7 x 5 x 7 mm epididymal cyst. Left epididymis:  Normal in size and appearance. Hydrocele:  None visualized. Varicocele:  None visualized. Additional comments: Moderate scrotal wall edema. No discrete fluid collection/abscess. IMPRESSION: Moderate scrotal wall edema.  No discrete fluid collection/abscess. Electronically Signed   By: Julian Hy M.D.   On: 06/06/2016 17:03     Assessment/Plan: HCAP Prev UTI- proteus  Acute on Chronic respiratory failure Fluid overload Decubitus ulcers  Total days of antibiotics:  Meropenem 9/20 >> Linezolid 9/19 >>9/25 Aztreonam 9/19 >> 9/19 Levaquin 9/19 >>9/19  Right midline PICC 9/19 >>  Would continue merrem sensi on acinetobacter pending.  Plan for terminal wean tomorrow.  Available as needed.          [name] Infectious Diseases (pager) [phone] [email or web address] 06/08/2016, 12:33 PM  LOS: 10 days

## 2016-06-08 NOTE — Progress Notes (Signed)
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 He was ventilator dependent after his initial TBI for few years then he did not require vent support until recently 2-3 years ago when he needed to be intubated and trach dependent since. He was wheelchair bound in the past but has been bed ridden for last few years. Has been in a contracted position for few years.  Patient is currently on ventilator support via trach MV: 9.0 L/min Temp (24hrs), Avg:97.7 F (36.5 C), Min:97.1 F (36.2 C), Max:98.2 F (36.8 C)  Vital AF 1.2 formula currently infusing a 70 ml/hr via PEG tube providing 2016 kcals, 126 gm protein, 1362 ml of free water. Palliative Care Team following. Working with family on moving to comfort measures.  Diet Order:  Diet NPO time specified  Skin:  Stage 4Sacrum  Healing stage 3 righthip  Left hip Stage 3 ulcer  Left great toe full thickness  L flank MARSI y L knee full thickness  L heel Stage 2   Last BM:  9/28  Height:   Ht Readings from Last 1 Encounters:  05/29/16 '5\' 10"'$  (1.778 m)    Weight:   Wt Readings from Last 1 Encounters:  06/08/16 161 lb (73 kg)    Ideal Body Weight:  64.2 kg  BMI:  Body mass index is 23.1 kg/m.  Estimated Nutritional Needs:   Kcal:  1710  Protein:  110-120  gm  Fluid:  2.0 L  EDUCATION NEEDS:   No education needs identified at this time  Arthur Holms, RD, LDN Pager #: [phone] After-Hours Pager #: [phone]

## 2016-06-08 NOTE — Progress Notes (Signed)
  Echocardiogram 2D Echocardiogram has been performed.  Arvil Chaco 06/08/2016, 1:53 PM

## 2016-06-08 NOTE — Progress Notes (Addendum)
PROGRESS NOTE    Dean Graham  LZJ:[id number] DOB: 03-01-1961 DOA: 05/29/2016 PCP: Verneita Griffes, MD   Brief Narrative:  55 y.o.BM SNF resident PMHx TBI, Quadriplegia , Chronic Trach-dependent respiratory failure, PEG tube, Neurogenic bladder with Chronic foley, Atrial fibrillation , Diastolic CHF, Anemia of chronic disease,Diabetes mellitus Type 2 without complication  Who was sent from [place] 9/19 with fever, hypotension and increased work of breathing.  In ED, pt was febrile with T max of 100.7 F, hypotensive with BP of 72/60 without improvement after 4L IV fluids. He is white blood cell count was 23. Chest x-ray showed bilateral pleural effusions with pneumonia versus atelectasis. Patient was admitted to ICU for possible need for pressor support. Patient was transferred to stepdown unit 05/30/2016. On meropenem and Linezolid. Now with increasing white count   Subjective: On vent, non verbal. , not following command.  Secretions from airway.    Assessment & Plan:   Principal Problem:   Septic shock ([place]) Active Problems:   HCAP (healthcare-associated pneumonia)   Pressure injury of skin   Quadriplegia (HCC)   Atrial fibrillation (HCC)   Diabetes mellitus without complication ([place])   Chronic respiratory failure ([place])   Pressure ulcer stage IV ([place])   Encounter for wound care   Protein-calorie malnutrition, severe ([place])   Palliative care encounter   Goals of care, counseling/discussion   Chronic post traumatic encephalopathy   Acute encephalopathy   Hypokalemia   Hypomagnesemia   Urinary tract infection, site not specified   Acute on chronic respiratory failure with hypoxia (HCC)   Dysphagia   Decubitus ulcer of sacral area   1-Septic shock/HCAP/ UTI  -Pt with chronic indwelling Foley catheter  - blood cx negative. -Patient remains tachycardic and hypotensive with increasing white count. Continue antibiotics per ID -Chest x-ray on admission showed bilateral  pleural effusions with pneumonia vs atelectasis -has recived  Albumin 50 g 2 - Continue midodrine 10 mg TID -C diff negative. Respiratory culture: Acinetobacter/Baumannin sensitivity pending. . Blood culture 9-25 no growth to date.  -IV  maintenance fluids at 50 cc /hr.  --[place] negative for abscess.   Acute on Chronic respiratory failure with hypoxia, trach-dependent - on vent  -IV lasix today.  Mucinex.    Anemia of chronic disease - Baseline hemoglobin around 8 - S/P 2 unit PRBC transfusion on 9-27 -hb stable at 10  Mild troponin elevation - Due to demand ischemic from septic shock - Troponin 0.09 --> 0.07  - No further work up needed at this time  -ECHO pending.   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 Free water added.   Hyperkalemia / Hypokalemia  - Repeat labs this afternoon.   Hypomagnesemia -Magnesium goal> 2 - Hypocalcemia -Corrected calcium= 10.1    Chronic diastolic CHF -Echocardiogram pending - Holding lasix due to soft BP, 111/80  Chronic atrial fibrillation(CHADS vasc score 2) - On amiodarone and aspirin   Dysphagia, PEG tube-dependent / Severe protein calorie malnutrition  - Chronic, stable  - Continue tube feeds   Quadriplegia - Stable   Neurogenic bladder - Chronic, stable - Has Foley catheter in place  Seizure disorder - Continue keppra, vimpat  Stage 4 sacral pressure ulcer, stage 3 right and left hip sacral ulcer - Appreciate wound care assessment - Stage 4Sacrum 10cm x 8cm x 0.2cm with  100% red gran tissue, mod amtdrainage green, mild odor - Healing stage 3 righthip 2.5cm x 2 cm 100 % red granulating, no drainage - Left hip Stage 3 ulcer 4.5cm x 7cm x 1cm, 100% red granulating, modamt green drainage, mild odor  -Left  great toe full thickness 0.8cm x 0.8cm 100% red gran tissue, no drainage or odor - L flank MARSI 0.5cm x 0.3cm pink and dry - L knee full thickness 2.5cm x 2cm red and moistno drainage or odor noted - L heel Stage 2 0.3cm x 0.3cm pink and moist no drainage or odor - Per WOC, apply quacell for sacrum and left hip to absorb drainage and provide antimicrobial benifitsand allevyn foam to the others -Placed on a mattress and ensure on rotation at all times   Goals of care Palliative care consulted.      DVT prophylaxis: Subcutaneous heparin  Code Status: DO NOT RESUSCITATE Family Communication:  palliative meeting with family for goals of care. Long conversation with patient 's brother, explain to him that [name] is still very sick, WBC still elevated, that this will be a cycle of recurrent infection, poor quality of life. [name] still hope for transferring patient to [facility], or another hospital. Agree with meeting with palliative on Monday, he would like to speak with CM for others options.   Disposition Plan: Await further palliative care recommendation   Consultants:  [name] ID Dr.Daniel Lily Kocher [facility] M    Procedures/Significant Events:  None   Cultures 9/19 blood right AC/hand negative 9/19 urine positive multiple species 9/20 MRSA by PCR negative 9/21 urine negative 9/25 blood left hand 2 NGTD 9/25 urine pending 9/25 tracheal aspirate positive GNR    Antimicrobials: Meropenem 9/20 >> Linezolid 9/19 >> 9/25 Aztreonam 9/19 >> 9/19 Levaquin 9/19 >>9/19   Devices None   LINES / TUBES:  Right midline PICC 9/19 >>    Continuous Infusions: . sodium chloride 20 mL/hr at 06/08/16 0942  . feeding supplement (VITAL AF 1.2 CAL) 1,000 mL (06/07/16 1310)     Objective: Vitals:   06/08/16 0449 06/08/16 0800 06/08/16 0858 06/08/16 1152  BP:  (!) 131/95 (!) 130/98 120/88  Pulse:  (!) 113 (!) 121 (!) 110  Resp:  16 (!) 28 19  Temp:  98.2 F (36.8  C)    TempSrc:  Axillary    SpO2:  100% 100% 100%  Weight: 73 kg (161 lb)     Height:        Intake/Output Summary (Last 24 hours) at 06/08/16 1303 Last data filed at 06/08/16 0449  Gross per 24 hour  Intake              940 ml  Output             1350 ml  Net             -410 ml   Filed Weights   06/06/16 0337 06/07/16 0402 06/08/16 0449  Weight: 71.7 kg (158 lb) 71.7 kg (158 lb) 73 kg (161 lb)    Examination:  General: A/O 0, positive chronic respiratory distress (on vent) Eyes: negative scleral hemorrhage, negative anisocoria, negative icterus, right eye swollen ENT: Negative Runny nose, negative gingival bleeding, Neck:  Negative scars, masses, torticollis, lymphadenopathy, JVD Lungs: coarse breath sounds diffusely, negative wheezes or crackles Cardiovascular: Regular rate and rhythm without murmur gallop or rub normal S1 and S2 Abdomen: negative abdominal pain, nondistended, positive soft, bowel sounds, no rebound, no ascites, no  appreciable mass, PEG site covered and clean negative sign of infection Extremities: No significant cyanosis, clubbing. Plus bilateral lower extremity edema 2+ Skin: Multiple hip/sacral ulcers stage 3/4 (did not review today).  Psychiatric:  Unable to evaluate  Central nervous system:  Grimaces to painful stimuli: Unable to complete evaluation secondary to altered mental status (unsure of baseline)   .     CBC:  Recent Labs Lab 06/04/16 0930 06/05/16 0515 06/06/16 0428 06/06/16 1216 06/07/16 0410 06/08/16 0450  WBC 23.5* 22.4* 15.2*  --  15.6* 18.7*  NEUTROABS 20.5* 19.7* 12.8*  --  12.5* 15.3*  HGB 8.9* 8.8* 7.0* 5.7* 10.0* 10.8*  HCT 28.3* 28.0* 23.1* 18.3* 30.7* 33.0*  MCV 97.9 97.9 99.6  --  94.5 94.0  PLT 178 196 161  --  114* 710*   Basic Metabolic Panel:  Recent Labs Lab 06/01/16 1700 06/02/16 0500  06/05/16 0910 06/06/16 0630 06/06/16 1216 06/07/16 0410 06/08/16 0450  NA  --   --   < > 144 147* 146* 144 143  K   --   --   < > 4.0 5.2* 3.6 4.8 4.4  CL  --   --   < > 114* 114* 117* 116* 114*  CO2  --   --   < > _0 GLUCOSE  --   --   < > 113* 100* 108* 103* 88  BUN  --   --   < > 33* 34* 33* 35* 32*  CREATININE  --   --   < > 0.52* 0.46* 0.43* 0.45* 0.40*  CALCIUM  --   --   < > 7.7* 8.1* 7.8* 8.0* 8.4*  MG 1.7 1.9  --  1.6* 2.1  --  2.2 2.1  PHOS 2.6 2.5  --   --   --   --   --   --   < > = values in this interval not displayed. GFR: Estimated Creatinine Clearance: 105.2 mL/min (by C-G formula based on SCr of 0.4 mg/dL (L)). Liver Function Tests:  Recent Labs Lab 06/05/16 0910 06/06/16 0630  AST 66* 74*  ALT 60 48  ALKPHOS 165* 122  BILITOT 0.8 0.8  PROT 4.7* 4.9*  ALBUMIN 1.2* 2.3*   No results for input(s): LIPASE, AMYLASE in the last 168 hours. No results for input(s): AMMONIA in the last 168 hours. Coagulation Profile: No results for input(s): INR, PROTIME in the last 168 hours. Cardiac Enzymes: No results for input(s): CKTOTAL, CKMB, CKMBINDEX, TROPONINI in the last 168 hours. BNP (last 3 results) No results for input(s): PROBNP in the last 8760 hours. HbA1C: No results for input(s): HGBA1C in the last 72 hours. CBG:  Recent Labs Lab 06/07/16 1943 06/07/16 2328 06/08/16 0307 06/08/16 0728 06/08/16 1218  GLUCAP 119* 111* 88 92 106*   Lipid Profile: No results for input(s): CHOL, HDL, LDLCALC, TRIG, CHOLHDL, LDLDIRECT in the last 72 hours. Thyroid Function Tests: No results for input(s): TSH, T4TOTAL, FREET4, T3FREE, THYROIDAB in the last 72 hours. Anemia Panel: No results for input(s): VITAMINB12, FOLATE, FERRITIN, TIBC, IRON, RETICCTPCT in the last 72 hours. Urine analysis:    Component Value Date/Time   COLORURINE YELLOW 06/04/2016 2034   APPEARANCEUR CLEAR 06/04/2016 2034   LABSPEC 1.027 06/04/2016 2034   PHURINE 6.0 06/04/2016 2034   GLUCOSEU NEGATIVE 06/04/2016 2034   HGBUR NEGATIVE 06/04/2016 2034   BILIRUBINUR NEGATIVE 06/04/2016 2034    [place] NEGATIVE 06/04/2016 2034   PROTEINUR  NEGATIVE 06/04/2016 2034   NITRITE NEGATIVE 06/04/2016 2034   LEUKOCYTESUR NEGATIVE 06/04/2016 2034   Sepsis Labs: _0 (procalcitonin:4,lacticidven:4)  ) Recent Results (from the past 240 hour(s))  MRSA PCR Screening     Status: None   Collection Time: 05/30/16  2:52 AM  Result Value Ref Range Status   MRSA by PCR NEGATIVE NEGATIVE Final    Comment:        The GeneXpert MRSA Assay (FDA approved for NASAL specimens only), is one component of a comprehensive MRSA colonization surveillance program. It is not intended to diagnose MRSA infection nor to guide or monitor treatment for MRSA infections.   Culture, Urine     Status: None   Collection Time: 05/31/16  3:20 PM  Result Value Ref Range Status   Specimen Description URINE, CATHETERIZED  Final   Special Requests NONE  Final   Culture NO GROWTH  Final   Report Status 06/01/2016 FINAL  Final  Culture, blood (routine x 2)     Status: None (Preliminary result)   Collection Time: 06/04/16  5:42 PM  Result Value Ref Range Status   Specimen Description BLOOD LEFT HAND  Final   Special Requests IN PEDIATRIC BOTTLE  3CC  Final   Culture NO GROWTH 3 DAYS  Final   Report Status PENDING  Incomplete  Culture, blood (routine x 2)     Status: None (Preliminary result)   Collection Time: 06/04/16  5:50 PM  Result Value Ref Range Status   Specimen Description BLOOD LEFT HAND  Final   Special Requests BOTTLES DRAWN AEROBIC ONLY  5CC  Final   Culture NO GROWTH 3 DAYS  Final   Report Status PENDING  Incomplete  C difficile quick scan w PCR reflex     Status: None   Collection Time: 06/04/16  6:30 PM  Result Value Ref Range Status   C Diff antigen NEGATIVE NEGATIVE Final   C Diff toxin NEGATIVE NEGATIVE Final   C Diff interpretation No C. difficile detected.  Final  Urine culture     Status: None   Collection Time: 06/04/16  8:33 PM  Result Value Ref Range Status   Specimen  Description URINE, CATHETERIZED  Final   Special Requests NONE  Final   Culture NO GROWTH  Final   Report Status 06/06/2016 FINAL  Final  Culture, respiratory (NON-Expectorated)     Status: None (Preliminary result)   Collection Time: 06/04/16 11:28 PM  Result Value Ref Range Status   Specimen Description TRACHEAL ASPIRATE  Final   Special Requests NONE  Final   Gram Stain   Final    RARE SQUAMOUS EPITHELIAL CELLS PRESENT FEW WBC PRESENT,BOTH PMN AND MONONUCLEAR MODERATE GRAM NEGATIVE RODS    Culture   Final    ABUNDANT ACINETOBACTER CALCOACETICUS/BAUMANNII COMPLEX TRYING TO GET ORGANISM ISOLATED ENOUGH TO PERFORM SUSCEPTIBILTY TESTING    Report Status PENDING  Incomplete         Radiology Studies: US Scrotum  Result Date: 06/06/2016 CLINICAL DATA:  Edema x3 days, history of quadriplegia EXAM: ULTRASOUND OF SCROTUM TECHNIQUE: Complete ultrasound examination of the testicles, epididymis, and other scrotal structures was performed. COMPARISON:  None. FINDINGS: Right testicle Measurements: 3.4 x 2.0 x 2.1 cm. Heterogeneous parenchyma without focal mass. No microlithiasis visualized. Left testicle Measurements: 3.3 x 2.3 x 2.9 cm. Heterogeneous parenchyma without focal mass. No microlithiasis visualized. Right epididymis:  7 x 5 x 7 mm epididymal cyst. Left epididymis:  Normal in size and appearance. Hydrocele:  None visualized. Varicocele:  None visualized. Additional comments: Moderate scrotal wall edema. No discrete fluid collection/abscess. IMPRESSION: Moderate scrotal wall edema.  No discrete fluid collection/abscess. Electronically Signed   By: Julian Hy M.D.   On: 06/06/2016 17:03        Scheduled Meds: . amiodarone  100 mg Oral Daily  . aspirin  81 mg Per Tube Daily  . chlorhexidine gluconate (MEDLINE KIT)  15 mL Mouth Rinse BID  . famotidine  20 mg Per Tube BID  . free water  200 mL Per Tube Q8H  . guaiFENesin  1,200 mg Oral BID  . heparin  5,000 Units  Subcutaneous Q8H  . insulin aspart  0-9 Units Subcutaneous Q4H  . lacosamide  100 mg Oral BID  . levETIRAcetam  500 mg Per Tube BID  . mouth rinse  15 mL Mouth Rinse QID  . meropenem (MERREM) IV  1 g Intravenous Q8H  . midodrine  10 mg Oral TID WC   Continuous Infusions: . sodium chloride 20 mL/hr at 06/08/16 0942  . feeding supplement (VITAL AF 1.2 CAL) 1,000 mL (06/07/16 1310)     LOS: 10 days    Time spent: 35 minutes    Elmarie Shiley, MD [facility] Pager [phone]  If 7PM-7AM, please contact night-coverage [email or web address] Password [email or web address] 06/08/2016, 1:03 PM

## 2016-06-09 DIAGNOSIS — A419 Sepsis, unspecified organism: Secondary | ICD-10-CM | POA: Diagnosis not present

## 2016-06-09 DIAGNOSIS — R6521 Severe sepsis with septic shock: Secondary | ICD-10-CM | POA: Diagnosis not present

## 2016-06-09 DIAGNOSIS — R509 Fever, unspecified: Secondary | ICD-10-CM | POA: Diagnosis not present

## 2016-06-09 LAB — CBC WITH DIFFERENTIAL/PLATELET
Basophils Absolute: 0 10*3/uL (ref 0.0–0.1)
Basophils Relative: 0 %
EOS ABS: 0.2 10*3/uL (ref 0.0–0.7)
Eosinophils Relative: 1 %
HCT: 32 % — ABNORMAL LOW (ref 39.0–52.0)
Hemoglobin: 10.4 g/dL — ABNORMAL LOW (ref 13.0–17.0)
Lymphocytes Relative: 9 %
Lymphs Abs: 1.5 10*3/uL (ref 0.7–4.0)
MCH: 30.8 pg (ref 26.0–34.0)
MCHC: 32.5 g/dL (ref 30.0–36.0)
MCV: 94.7 fL (ref 78.0–100.0)
MONO ABS: 1.8 10*3/uL — AB (ref 0.1–1.0)
Monocytes Relative: 11 %
NEUTROS PCT: 79 %
Neutro Abs: 12.8 10*3/uL — ABNORMAL HIGH (ref 1.7–7.7)
PLATELETS: 99 10*3/uL — AB (ref 150–400)
RBC: 3.38 MIL/uL — AB (ref 4.22–5.81)
RDW: 16.9 % — AB (ref 11.5–15.5)
WBC: 16.3 10*3/uL — AB (ref 4.0–10.5)

## 2016-06-09 LAB — CULTURE, BLOOD (ROUTINE X 2)
CULTURE: NO GROWTH
CULTURE: NO GROWTH

## 2016-06-09 LAB — GLUCOSE, CAPILLARY
GLUCOSE-CAPILLARY: 109 mg/dL — AB (ref 65–99)
GLUCOSE-CAPILLARY: 110 mg/dL — AB (ref 65–99)
Glucose-Capillary: 108 mg/dL — ABNORMAL HIGH (ref 65–99)
Glucose-Capillary: 108 mg/dL — ABNORMAL HIGH (ref 65–99)
Glucose-Capillary: 108 mg/dL — ABNORMAL HIGH (ref 65–99)
Glucose-Capillary: 95 mg/dL (ref 65–99)

## 2016-06-09 LAB — BASIC METABOLIC PANEL
Anion gap: 3 — ABNORMAL LOW (ref 5–15)
Anion gap: 2 — ABNORMAL LOW (ref 5–15)
BUN: 32 mg/dL — ABNORMAL HIGH (ref 6–20)
BUN: 28 mg/dL — AB (ref 6–20)
CALCIUM: 8 mg/dL — AB (ref 8.9–10.3)
CHLORIDE: 111 mmol/L (ref 101–111)
CO2: 27 mmol/L (ref 22–32)
CO2: 27 mmol/L (ref 22–32)
CREATININE: 0.39 mg/dL — AB (ref 0.61–1.24)
Calcium: 7.8 mg/dL — ABNORMAL LOW (ref 8.9–10.3)
Chloride: 112 mmol/L — ABNORMAL HIGH (ref 101–111)
Creatinine, Ser: 0.34 mg/dL — ABNORMAL LOW (ref 0.61–1.24)
GFR calc Af Amer: >60 mL/min (ref >60)
GFR calc non Af Amer: >60 mL/min (ref >60)
GFR calc non Af Amer: >60 mL/min (ref >60)
Glucose, Bld: 106 mg/dL — ABNORMAL HIGH (ref 65–99)
Glucose, Bld: 105 mg/dL — ABNORMAL HIGH (ref 65–99)
POTASSIUM: 4 mmol/L (ref 3.5–5.1)
Potassium: 5.6 mmol/L — ABNORMAL HIGH (ref 3.5–5.1)
SODIUM: 142 mmol/L (ref 135–145)
SODIUM: 140 mmol/L (ref 135–145)

## 2016-06-09 LAB — MAGNESIUM: MAGNESIUM: 2.1 mg/dL (ref 1.7–2.4)

## 2016-06-09 MED ORDER — SULFAMETHOXAZOLE-TRIMETHOPRIM 400-80 MG/5ML IV SOLN
320.0000 mg | Freq: Three times a day (TID) | INTRAVENOUS | Status: DC
  Administered 2016-06-09 – 2016-06-10 (×2): 320 mg via INTRAVENOUS
  Filled 2016-06-09 (×5): qty 20

## 2016-06-09 MED ORDER — DEXTROSE 5 % IV SOLN
2.0000 g | Freq: Three times a day (TID) | INTRAVENOUS | Status: DC
  Administered 2016-06-09 – 2016-06-10 (×3): 2 g via INTRAVENOUS
  Filled 2016-06-09 (×4): qty 2

## 2016-06-09 NOTE — Progress Notes (Signed)
CSW continuing to follow for placement needs. [facility] does not have beds currently.  Dean Graham LCSWA [phone]

## 2016-06-09 NOTE — Progress Notes (Signed)
Pt allergic to ceftriaxone per pt family, unknown reaction, pharmacy updated, pharmacy stated to update ID for further action, nursing will cont to monitor

## 2016-06-09 NOTE — Progress Notes (Addendum)
PROGRESS NOTE    Dean Graham  OFB:[id number] DOB: [birth year] DOA: 05/29/2016 PCP: Verneita Griffes, MD   Brief Narrative:  55 y.o.BM SNF resident PMHx TBI, Quadriplegia , Chronic Trach-dependent respiratory failure, PEG tube, Neurogenic bladder with Chronic foley, Atrial fibrillation , Diastolic CHF, Anemia of chronic disease,Diabetes mellitus Type 2 without complication  Who was sent from [place] 9/19 with fever, hypotension and increased work of breathing.  In ED, pt was febrile with T max of 100.7 F, hypotensive with BP of 72/60 without improvement after 4L IV fluids. He is white blood cell count was 23. Chest x-ray showed bilateral pleural effusions with pneumonia versus atelectasis. Patient was admitted to ICU for possible need for pressor support. Patient was transferred to stepdown unit 05/30/2016. On meropenem and Linezolid. Now with increasing white count   Subjective: On vent, moves head to answer yes and no questions, eyes open.  He denies pain.  Secretions from airway.    Assessment & Plan:   Principal Problem:   Septic shock ([place]) Active Problems:   HCAP (healthcare-associated pneumonia)   Pressure injury of skin   Quadriplegia (HCC)   Atrial fibrillation (HCC)   Diabetes mellitus without complication ([place])   Chronic respiratory failure ([place])   Pressure ulcer stage IV ([place])   Encounter for wound care   Protein-calorie malnutrition, severe ([place])   Palliative care encounter   Goals of care, counseling/discussion   Chronic post traumatic encephalopathy   Acute encephalopathy   Hypokalemia   Hypomagnesemia   Urinary tract infection, site not specified   Acute on chronic respiratory failure with hypoxia (HCC)   Dysphagia   Decubitus ulcer of sacral area   1-Septic shock/HCAP/ UTI  -Pt with chronic indwelling Foley catheter  - blood cx negative. -Patient remains tachycardic and hypotensive with increasing white count. Continue antibiotics per ID -Chest  x-ray on admission showed bilateral pleural effusions with pneumonia vs atelectasis -has recived  Albumin 50 g 2 - Continue midodrine 10 mg TID -C diff negative. Respiratory culture: Acinetobacter/Baumannin sensitivity pending. . Blood culture 9-25 no growth to date.  --[place] negative for abscess.  --culture growing Pseudomonas, pan-reistant, only sensitive to ceftazidime. Patient with reported cephalosporin allergy, will request record from prior hospital, care everywhere reviewed, no information regarding cephalosporin allergy. Discussed with Dr Tommy Medal will add Aztreonam that might cover pseudomonas.   Acute on Chronic respiratory failure with hypoxia, trach-dependent - on vent  -hold on lasix today , SBP soft.  Mucinex.   Diarrhea;  Persist; C diff negative 9-25  Anemia of chronic disease - Baseline hemoglobin around 8 - S/P 2 unit PRBC transfusion on 9-27 -hb stable at 10  Mild troponin elevation - Due to demand ischemic from septic shock - Troponin 0.09 --> 0.07  - No further work up needed at this time  -ECHO no wall motion abnormalities.   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 Free water added.   Hyperkalemia / Hypokalemia  - Repeat labs this afternoon.  Hemolysis notice on labs.   Hypomagnesemia -Magnesium goal> 2 - Hypocalcemia -Corrected calcium= 10.1    Chronic diastolic CHF -Echocardiogram pending - Holding lasix due to soft BP, 111/80  Chronic atrial fibrillation(CHADS vasc score 2) - On amiodarone and aspirin   Dysphagia, PEG tube-dependent / Severe protein calorie malnutrition  - Chronic, stable  -  Continue tube feeds   Quadriplegia - Stable   Neurogenic bladder - Chronic, stable - Has Foley catheter in place  Seizure disorder -  Continue keppra, vimpat  Stage 4 sacral pressure ulcer, stage 3 right and left hip sacral ulcer - Appreciate wound care assessment - Stage 4Sacrum 10cm x 8cm x 0.2cm with 100% red gran tissue, mod amtdrainage green, mild odor - Healing stage 3 righthip 2.5cm x 2 cm 100 % red granulating, no drainage - Left hip Stage 3 ulcer 4.5cm x 7cm x 1cm, 100% red granulating, modamt green drainage, mild odor  -Left great toe full thickness 0.8cm x 0.8cm 100% red gran tissue, no drainage or odor - L flank MARSI 0.5cm x 0.3cm pink and dry - L knee full thickness 2.5cm x 2cm red and moistno drainage or odor noted - L heel Stage 2 0.3cm x 0.3cm pink and moist no drainage or odor - Per WOC, apply quacell for sacrum and left hip to absorb drainage and provide antimicrobial benifitsand allevyn foam to the others -Placed on a mattress and ensure on rotation at all times   Goals of care Palliative care consulted.      DVT prophylaxis: Subcutaneous heparin  Code Status: DO NOT RESUSCITATE Family Communication:  palliative meeting with family for goals of care. Long conversation with patient 29 brother on 9-29, explain to him that Con[place] is still very sick, , that this will be a cycle of recurrent infection, poor quality of life. [name] still hope for transferring patient to LTAC. Agree with meeting with palliative on Monday, he would like to speak with CM for others options.   Disposition Plan: Await further palliative care recommendation   Consultants:  [name] ID Dr.Daniel Lily Kocher [facility] M    Procedures/Significant Events:  None   Cultures 9/19 blood right AC/hand negative 9/19 urine positive multiple species 9/20 MRSA by PCR negative 9/21 urine negative 9/25 blood left hand 2 NGTD 9/25 urine pending 9/25 tracheal aspirate positive GNR    Antimicrobials: Meropenem 9/20 >> Linezolid 9/19 >> 9/25 Aztreonam 9/19 >> 9/19 Levaquin 9/19  >>9/19   Devices None   LINES / TUBES:  Right midline PICC 9/19 >>    Continuous Infusions: . sodium chloride 20 mL/hr at 06/08/16 0942  . feeding supplement (VITAL AF 1.2 CAL) 1,000 mL (06/08/16 2238)     Objective: Vitals:   06/08/16 1943 06/09/16 0040 06/09/16 0317 06/09/16 0748  BP:  99/77 111/85 94/80  Pulse: (!) 112 (!) 106 (!) 107 (!) 113  Resp: _0 Temp:  (!) 96.6 F (35.9 C) 98.1 F (36.7 C)   TempSrc:  Axillary Oral   SpO2: 100% 100% 100% 100%  Weight:   73.5 kg (162 lb)   Height:        Intake/Output Summary (Last 24 hours) at 06/09/16 0758 Last data filed at 06/09/16 0629  Gross per 24 hour  Intake             1490 ml  Output             3175 ml  Net            -1685 ml   Filed Weights   06/07/16 0402 06/08/16 0449 06/09/16 0317  Weight: 71.7 kg (158 lb) 73 kg (161 lb) 73.5 kg (162 lb)    Examination:  General: A/O 0, positive chronic respiratory distress (on vent) Eyes: negative scleral hemorrhage, negative anisocoria, negative icterus, right eye  swollen ENT: Negative Runny nose, negative gingival bleeding, Neck:  Negative scars, masses, torticollis, lymphadenopathy, JVD, trach  Lungs: coarse breath sounds diffusely, negative wheezes or crackles Cardiovascular: Regular rate and rhythm without murmur gallop or rub normal S1 and S2 Abdomen: negative abdominal pain, nondistended, positive soft, bowel sounds, no rebound, no ascites, no appreciable mass, PEG site covered and clean negative sign of infection Extremities: No significant cyanosis, clubbing. Plus bilateral lower extremity edema 2+ Skin: Multiple hip/sacral ulcers stage 3/4 (did not review today).  Psychiatric:  Unable to evaluate  Central nervous system:  Moves head to answer yes and no questions.   .     CBC:  Recent Labs Lab 06/05/16 0515 06/06/16 0428 06/06/16 1216 06/07/16 0410 06/08/16 0450 06/09/16 0411  WBC 22.4* 15.2*  --  15.6* 18.7* 16.3*  NEUTROABS  19.7* 12.8*  --  12.5* 15.3* 12.8*  HGB 8.8* 7.0* 5.7* 10.0* 10.8* 10.4*  HCT 28.0* 23.1* 18.3* 30.7* 33.0* 32.0*  MCV 97.9 99.6  --  94.5 94.0 94.7  PLT 196 161  --  114* 105* 99*   Basic Metabolic Panel:  Recent Labs Lab 06/05/16 0910 06/06/16 0630 06/06/16 1216 06/07/16 0410 06/08/16 0450 06/09/16 0411  NA 144 147* 146* 144 143 142  K 4.0 5.2* 3.6 4.8 4.4 5.6*  CL 114* 114* 117* 116* 114* 112*  CO2 _0 GLUCOSE 113* 100* 108* 103* 88 106*  BUN 33* 34* 33* 35* 32* 32*  CREATININE 0.52* 0.46* 0.43* 0.45* 0.40* 0.39*  CALCIUM 7.7* 8.1* 7.8* 8.0* 8.4* 8.0*  MG 1.6* 2.1  --  2.2 2.1 2.1   GFR: Estimated Creatinine Clearance: 105.2 mL/min (by C-G formula based on SCr of 0.39 mg/dL (L)). Liver Function Tests:  Recent Labs Lab 06/05/16 0910 06/06/16 0630  AST 66* 74*  ALT 60 48  ALKPHOS 165* 122  BILITOT 0.8 0.8  PROT 4.7* 4.9*  ALBUMIN 1.2* 2.3*   No results for input(s): LIPASE, AMYLASE in the last 168 hours. No results for input(s): AMMONIA in the last 168 hours. Coagulation Profile: No results for input(s): INR, PROTIME in the last 168 hours. Cardiac Enzymes: No results for input(s): CKTOTAL, CKMB, CKMBINDEX, TROPONINI in the last 168 hours. BNP (last 3 results) No results for input(s): PROBNP in the last 8760 hours. HbA1C: No results for input(s): HGBA1C in the last 72 hours. CBG:  Recent Labs Lab 06/08/16 1610 06/08/16 1936 06/09/16 0038 06/09/16 0353 06/09/16 0732  GLUCAP 107* 95 109* 108* 108*   Lipid Profile: No results for input(s): CHOL, HDL, LDLCALC, TRIG, CHOLHDL, LDLDIRECT in the last 72 hours. Thyroid Function Tests: No results for input(s): TSH, T4TOTAL, FREET4, T3FREE, THYROIDAB in the last 72 hours. Anemia Panel: No results for input(s): VITAMINB12, FOLATE, FERRITIN, TIBC, IRON, RETICCTPCT in the last 72 hours. Urine analysis:    Component Value Date/Time   COLORURINE YELLOW 06/04/2016 2034   APPEARANCEUR CLEAR  06/04/2016 2034   LABSPEC 1.027 06/04/2016 2034   PHURINE 6.0 06/04/2016 2034   GLUCOSEU NEGATIVE 06/04/2016 2034   HGBUR NEGATIVE 06/04/2016 2034   BILIRUBINUR NEGATIVE 06/04/2016 2034   [place] NEGATIVE 06/04/2016 2034   PROTEINUR NEGATIVE 06/04/2016 2034   NITRITE NEGATIVE 06/04/2016 2034   LEUKOCYTESUR NEGATIVE 06/04/2016 2034   Sepsis Labs: _1 (procalcitonin:4,lacticidven:4)  ) Recent Results (from the past 240 hour(s))  Culture, Urine     Status: None   Collection Time: 05/31/16  3:20 PM  Result Value Ref Range Status  Specimen Description URINE, CATHETERIZED  Final   Special Requests NONE  Final   Culture NO GROWTH  Final   Report Status 06/01/2016 FINAL  Final  Culture, blood (routine x 2)     Status: None (Preliminary result)   Collection Time: 06/04/16  5:42 PM  Result Value Ref Range Status   Specimen Description BLOOD LEFT HAND  Final   Special Requests IN PEDIATRIC BOTTLE  3CC  Final   Culture NO GROWTH 4 DAYS  Final   Report Status PENDING  Incomplete  Culture, blood (routine x 2)     Status: None (Preliminary result)   Collection Time: 06/04/16  5:50 PM  Result Value Ref Range Status   Specimen Description BLOOD LEFT HAND  Final   Special Requests BOTTLES DRAWN AEROBIC ONLY  5CC  Final   Culture NO GROWTH 4 DAYS  Final   Report Status PENDING  Incomplete  C difficile quick scan w PCR reflex     Status: None   Collection Time: 06/04/16  6:30 PM  Result Value Ref Range Status   C Diff antigen NEGATIVE NEGATIVE Final   C Diff toxin NEGATIVE NEGATIVE Final   C Diff interpretation No C. difficile detected.  Final  Urine culture     Status: None   Collection Time: 06/04/16  8:33 PM  Result Value Ref Range Status   Specimen Description URINE, CATHETERIZED  Final   Special Requests NONE  Final   Culture NO GROWTH  Final   Report Status 06/06/2016 FINAL  Final  Culture, respiratory (NON-Expectorated)     Status: None (Preliminary result)   Collection  Time: 06/04/16 11:28 PM  Result Value Ref Range Status   Specimen Description TRACHEAL ASPIRATE  Final   Special Requests NONE  Final   Gram Stain   Final    RARE SQUAMOUS EPITHELIAL CELLS PRESENT FEW WBC PRESENT,BOTH PMN AND MONONUCLEAR MODERATE GRAM NEGATIVE RODS    Culture   Final    ABUNDANT ACINETOBACTER CALCOACETICUS/BAUMANNII COMPLEX SUSCEPTIBILITIES TO FOLLOW    Report Status PENDING  Incomplete         Radiology Studies: No results found.      Scheduled Meds: . amiodarone  100 mg Oral Daily  . aspirin  81 mg Per Tube Daily  . chlorhexidine gluconate (MEDLINE KIT)  15 mL Mouth Rinse BID  . famotidine  20 mg Per Tube BID  . free water  200 mL Per Tube Q8H  . heparin  5,000 Units Subcutaneous Q8H  . insulin aspart  0-9 Units Subcutaneous Q4H  . lacosamide  100 mg Oral BID  . levETIRAcetam  500 mg Per Tube BID  . mouth rinse  15 mL Mouth Rinse QID  . meropenem (MERREM) IV  1 g Intravenous Q8H  . midodrine  10 mg Oral TID WC   Continuous Infusions: . sodium chloride 20 mL/hr at 06/08/16 0942  . feeding supplement (VITAL AF 1.2 CAL) 1,000 mL (06/08/16 2238)     LOS: 11 days    Time spent: 35 minutes    Elmarie Shiley, MD [facility] Pager [phone]  If 7PM-7AM, please contact night-coverage [email or web address] Password [email or web address] 06/09/2016, 7:58 AM

## 2016-06-09 NOTE — Progress Notes (Signed)
Pharmacy Antibiotic Note  Dean Graham is a 55 y.o. male admitted on 05/29/2016 with pneumonia.  Pharmacy has been consulted for bactrim and aztreonam dosing. Patient is from [facility] and has MDR organisms in trach aspirate from 9/25. Discussed case with Dr. Daiva Eves, patient with Pseudomonas, Acinetobacter and Stenotrophomonas on culture. Pseudomonas is MDR (S-ceftazidime), but patient has documented allergy to ceftriaxone and allergy is not able to be clarified at this time. Will attempt to treat with aztreonam due to patient's allergy. Acinetobacter and Stenotrophomonas sensitivities are still pending and should be available in the morning. Will empirically treat with bactrim and aztreonam.   Plan: - Discontinue meropenem, patient has received 10 days of treatment - Start sulfamethoxazole-trimethoprim 320 mg (~13 mg/kg/day) IV q8h - Start aztreonam 2g IV q8h - Continue to follow culture & sensitivities and possible need for send out sensitivity testing - Monitor renal function, CBC and clinical progression  Height: 5\' 10"  (177.8 cm) Weight: 162 lb (73.5 kg) IBW/kg (Calculated) : 73  Temp (24hrs), Avg:97.6 F (36.4 C), Min:96.6 F (35.9 C), Max:98.3 F (36.8 C)   Recent Labs Lab 06/05/16 0515  06/05/16 1235 06/05/16 2000 06/05/16 2236 06/06/16 0428  06/06/16 1216 06/07/16 0410 06/08/16 0450 06/09/16 0411 06/09/16 1410  WBC 22.4*  --   --   --   --  15.2*  --   --  15.6* 18.7* 16.3*  --   CREATININE  --   < >  --   --   --   --   < > 0.43* 0.45* 0.40* 0.39* 0.34*  LATICACIDVEN  --   --  2.7* 2.8* 2.7*  --   --   --   --   --   --   --   < > = values in this interval not displayed.  Estimated Creatinine Clearance: 105.2 mL/min (by C-G formula based on SCr of 0.34 mg/dL (L)).    Allergies  Allergen Reactions  . Ativan [Lorazepam]   . Azithromycin   . Ceftriaxone   . Vancomycin     Antimicrobials this admission: Aztreo 9/19>>9/19 Levaquin  9/19>>9/19 Imipenem-cilastatin 9/19>> 9/20 Linezolid 9/19>> 9/26 Merem 9/20>>9/30 Aztreonam 9/30>> Bactrim 9/30>>  Microbiology results: 9/19 BCx: no growth final 9/19 UCx: multiple species  9/20 MRSA PCR: Negative 9/21 UCx: no growth 9/25 C Diff: negative 9/25 UCx: no growth 9/25 Trach asp: abundant pseudomonas (S-ceftazidime only), abundant acinetobacter, abundant stenotrophomonas 9/25 BCx: no growth final  Thank you for allowing pharmacy to be a part of this patient's care.  Casilda Carls, PharmD. PGY-2 Infectious Diseases Pharmacy Resident Pager: [phone] 06/09/2016 7:41 PM

## 2016-06-10 ENCOUNTER — Inpatient Hospital Stay (HOSPITAL_COMMUNITY): Payer: Medicare Other

## 2016-06-10 DIAGNOSIS — R532 Functional quadriplegia: Secondary | ICD-10-CM | POA: Diagnosis not present

## 2016-06-10 DIAGNOSIS — J95851 Ventilator associated pneumonia: Secondary | ICD-10-CM | POA: Diagnosis present

## 2016-06-10 DIAGNOSIS — Z9911 Dependence on respirator [ventilator] status: Secondary | ICD-10-CM | POA: Diagnosis not present

## 2016-06-10 DIAGNOSIS — Z1624 Resistance to multiple antibiotics: Secondary | ICD-10-CM | POA: Diagnosis not present

## 2016-06-10 DIAGNOSIS — B9689 Other specified bacterial agents as the cause of diseases classified elsewhere: Secondary | ICD-10-CM | POA: Diagnosis not present

## 2016-06-10 DIAGNOSIS — I248 Other forms of acute ischemic heart disease: Secondary | ICD-10-CM | POA: Diagnosis not present

## 2016-06-10 DIAGNOSIS — L89213 Pressure ulcer of right hip, stage 3: Secondary | ICD-10-CM | POA: Diagnosis not present

## 2016-06-10 DIAGNOSIS — Z8619 Personal history of other infectious and parasitic diseases: Secondary | ICD-10-CM

## 2016-06-10 DIAGNOSIS — J9621 Acute and chronic respiratory failure with hypoxia: Secondary | ICD-10-CM | POA: Diagnosis not present

## 2016-06-10 DIAGNOSIS — R05 Cough: Secondary | ICD-10-CM

## 2016-06-10 DIAGNOSIS — A419 Sepsis, unspecified organism: Secondary | ICD-10-CM | POA: Diagnosis present

## 2016-06-10 DIAGNOSIS — E870 Hyperosmolality and hypernatremia: Secondary | ICD-10-CM | POA: Diagnosis not present

## 2016-06-10 DIAGNOSIS — L89223 Pressure ulcer of left hip, stage 3: Secondary | ICD-10-CM | POA: Diagnosis not present

## 2016-06-10 DIAGNOSIS — Z1619 Resistance to other specified beta lactam antibiotics: Secondary | ICD-10-CM

## 2016-06-10 DIAGNOSIS — R6521 Severe sepsis with septic shock: Secondary | ICD-10-CM | POA: Diagnosis present

## 2016-06-10 DIAGNOSIS — A498 Other bacterial infections of unspecified site: Secondary | ICD-10-CM

## 2016-06-10 DIAGNOSIS — G9340 Encephalopathy, unspecified: Secondary | ICD-10-CM | POA: Diagnosis not present

## 2016-06-10 DIAGNOSIS — R093 Abnormal sputum: Secondary | ICD-10-CM

## 2016-06-10 DIAGNOSIS — E43 Unspecified severe protein-calorie malnutrition: Secondary | ICD-10-CM | POA: Diagnosis not present

## 2016-06-10 DIAGNOSIS — J690 Pneumonitis due to inhalation of food and vomit: Secondary | ICD-10-CM | POA: Diagnosis not present

## 2016-06-10 DIAGNOSIS — L89154 Pressure ulcer of sacral region, stage 4: Secondary | ICD-10-CM | POA: Diagnosis not present

## 2016-06-10 DIAGNOSIS — N390 Urinary tract infection, site not specified: Secondary | ICD-10-CM | POA: Diagnosis not present

## 2016-06-10 DIAGNOSIS — G8250 Quadriplegia, unspecified: Secondary | ICD-10-CM | POA: Diagnosis not present

## 2016-06-10 DIAGNOSIS — E872 Acidosis: Secondary | ICD-10-CM | POA: Diagnosis not present

## 2016-06-10 DIAGNOSIS — Y95 Nosocomial condition: Secondary | ICD-10-CM | POA: Diagnosis not present

## 2016-06-10 DIAGNOSIS — D62 Acute posthemorrhagic anemia: Secondary | ICD-10-CM | POA: Diagnosis not present

## 2016-06-10 DIAGNOSIS — Z87820 Personal history of traumatic brain injury: Secondary | ICD-10-CM

## 2016-06-10 DIAGNOSIS — R509 Fever, unspecified: Secondary | ICD-10-CM | POA: Diagnosis present

## 2016-06-10 DIAGNOSIS — Y848 Other medical procedures as the cause of abnormal reaction of the patient, or of later complication, without mention of misadventure at the time of the procedure: Secondary | ICD-10-CM | POA: Diagnosis not present

## 2016-06-10 DIAGNOSIS — I5032 Chronic diastolic (congestive) heart failure: Secondary | ICD-10-CM | POA: Diagnosis not present

## 2016-06-10 LAB — CBC WITH DIFFERENTIAL/PLATELET
BASOS PCT: 0 %
Basophils Absolute: 0 10*3/uL (ref 0.0–0.1)
EOS PCT: 1 %
Eosinophils Absolute: 0.2 10*3/uL (ref 0.0–0.7)
HEMATOCRIT: 31.8 % — AB (ref 39.0–52.0)
Hemoglobin: 10.2 g/dL — ABNORMAL LOW (ref 13.0–17.0)
LYMPHS ABS: 1.6 10*3/uL (ref 0.7–4.0)
Lymphocytes Relative: 9 %
MCH: 30.5 pg (ref 26.0–34.0)
MCHC: 32.1 g/dL (ref 30.0–36.0)
MCV: 95.2 fL (ref 78.0–100.0)
MONO ABS: 3 10*3/uL — AB (ref 0.1–1.0)
Monocytes Relative: 17 %
NEUTROS ABS: 12.6 10*3/uL — AB (ref 1.7–7.7)
NEUTROS PCT: 73 %
Platelets: 88 10*3/uL — ABNORMAL LOW (ref 150–400)
RBC: 3.34 MIL/uL — ABNORMAL LOW (ref 4.22–5.81)
RDW: 16 % — AB (ref 11.5–15.5)
WBC: 17.4 10*3/uL — ABNORMAL HIGH (ref 4.0–10.5)

## 2016-06-10 LAB — BASIC METABOLIC PANEL
Anion gap: 3 — ABNORMAL LOW (ref 5–15)
BUN: 28 mg/dL — ABNORMAL HIGH (ref 6–20)
CHLORIDE: 112 mmol/L — AB (ref 101–111)
CO2: 29 mmol/L (ref 22–32)
CREATININE: 0.35 mg/dL — AB (ref 0.61–1.24)
Calcium: 8 mg/dL — ABNORMAL LOW (ref 8.9–10.3)
GFR calc non Af Amer: >60 mL/min (ref >60)
Glucose, Bld: 99 mg/dL (ref 65–99)
Potassium: 4.1 mmol/L (ref 3.5–5.1)
Sodium: 144 mmol/L (ref 135–145)

## 2016-06-10 LAB — GLUCOSE, CAPILLARY
GLUCOSE-CAPILLARY: 84 mg/dL (ref 65–99)
GLUCOSE-CAPILLARY: 133 mg/dL — AB (ref 65–99)
GLUCOSE-CAPILLARY: 109 mg/dL — AB (ref 65–99)
GLUCOSE-CAPILLARY: 81 mg/dL (ref 65–99)
Glucose-Capillary: 113 mg/dL — ABNORMAL HIGH (ref 65–99)
Glucose-Capillary: 141 mg/dL — ABNORMAL HIGH (ref 65–99)
Glucose-Capillary: 94 mg/dL (ref 65–99)

## 2016-06-10 LAB — MAGNESIUM: Magnesium: 1.7 mg/dL (ref 1.7–2.4)

## 2016-06-10 MED ORDER — MAGNESIUM SULFATE 2 GM/50ML IV SOLN
2.0000 g | Freq: Once | INTRAVENOUS | Status: AC
  Administered 2016-06-10: 2 g via INTRAVENOUS
  Filled 2016-06-10: qty 50

## 2016-06-10 MED ORDER — HYPROMELLOSE (GONIOSCOPIC) 2.5 % OP SOLN
1.0000 [drp] | OPHTHALMIC | Status: DC | PRN
  Administered 2016-06-15: 1 [drp] via OPHTHALMIC
  Filled 2016-06-10: qty 15

## 2016-06-10 MED ORDER — FUROSEMIDE 10 MG/ML IJ SOLN
40.0000 mg | Freq: Once | INTRAMUSCULAR | Status: AC
  Administered 2016-06-10: 40 mg via INTRAVENOUS
  Filled 2016-06-10: qty 4

## 2016-06-10 MED ORDER — SACCHAROMYCES BOULARDII 250 MG PO CAPS
250.0000 mg | ORAL_CAPSULE | Freq: Two times a day (BID) | ORAL | Status: DC
  Administered 2016-06-10 – 2016-06-18 (×18): 250 mg via ORAL
  Filled 2016-06-10 (×18): qty 1

## 2016-06-10 NOTE — Consult Note (Signed)
Date of Admission:  05/29/2016  Date of Consult:  06/10/2016  Reason for Consult: MDR on tracheal aspirate Referring Physician: Dr. Tyrell Antonio   HPI: Dean Graham is an 55 y.o. 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    He was admitted on 9-19 from  [facility]  with increased work of breathing, fever (100.7), hypotension, lethargy and WBC 23.  Chest xray showed bilateral pleural effusions with pneumonia or atelectasis. Treated with ~4L and remained hypotensive with a MAP around 65 (normally on midodrine). He was additionally made a DNR on admission.  He was placed on aztreonam and linezolid.  His urinalysis showed some bacteria with alkaline pH of 7 and triple phosphate crystals suggestive of proteus infection.  Since admission, his blood and urine cultures have been negative and he has remained afebrile.  WBC down to 10.1 on 9/21 but with steadily rising since WBC, to 23.5 when seen by Dr. Johnnye Sima. The patient had been on a myriad of antibiotics including zyvox, aztreonam, merrem, levaquin. A tracheal aspirate was sent from the 25th and meropenem was continued.  The patient has remained afebrile and WBC down.  However he is growing several MDR organisms including acinetobacter that was resistant against all antibiotics tested against it of the then intermediate susceptibility to gentamicin. Note colistin sensitivities were not reported, pseudomonas aeruginosa that was resistant to carboplatinum's gentamicin fluoroquinolones with sensitivity to ceftaz edema and intermediate susceptibility to cefepime and finally stenotrophomonas maltophilia with intermediate susceptibilities to  Levaquin but no susceptibilities to Bactrim reported.  Last night I got a phone call about the pseudomonal isolate, but susceptibilities to the Acinetobacter were not back neither with a 3 to stenotrophomonas spoke with Lovena Le stone with pharmacy and we came up with aztreonam and Bactrim aztreonam to cover his Pseudomonas had and his pseudomonas before I could see the patient today.  Today sensis are back the Acinetobacter is centrally pain resistant to antibiotics that a been tested. We don't have subset abilities back on the stenotrophomonas.  In assessing him today clinically he does not seem toxic to me and his fever curve and white blood cell count trend have improved I obtained a chest x-ray which shows stability of his infiltrates and bilateral pleural effusions.  We will try to find out more data on potential drugs that can be used against the organisms that a been isolated but I do not know whether these are true organisms or colonizers. At this point in time I think that he needs to have his antibiotics stopped and be observed off antibiotics.  If he worsens then I would ask pulmonary croup care to perform bronchoscopy for deep specimens for culture.  It was my understanding that previously a decision and it made for terminal wean but that the family had changed their minds. I personally do not think this patients quality of life is very meaningful and his prognosis is very poor. He lives at [place]  Past Medical History:  Diagnosis Date  . Atrial fibrillation ([place])   . Chronic respiratory failure ([place])   . Diabetes mellitus without complication ([place])   . Diastolic heart failure ([place])   . Dysphagia   . GERD (gastroesophageal reflux disease)   .  Quadriplegia ([place])     No past surgical history on file.  Social History:  has no tobacco, alcohol, and drug history on file.   No family history on file.  Allergies  Allergen Reactions  . Ativan [Lorazepam]   . Azithromycin   .  Ceftriaxone   . Vancomycin      Medications: I have reviewed patients current medications as documented in Epic Anti-infectives    Start     Dose/Rate Route Frequency Ordered Stop   06/09/16 2200  sulfamethoxazole-trimethoprim (BACTRIM) 320 mg in dextrose 5 % 500 mL IVPB     320 mg 346.7 mL/hr over 90 Minutes Intravenous Every 8 hours 06/09/16 1950     06/09/16 1900  aztreonam (AZACTAM) 2 g in dextrose 5 % 50 mL IVPB     2 g 100 mL/hr over 30 Minutes Intravenous Every 8 hours 06/09/16 1840     05/30/16 1800  meropenem (MERREM) 1 g in sodium chloride 0.9 % 100 mL IVPB  Status:  Discontinued     1 g 200 mL/hr over 30 Minutes Intravenous Every 8 hours 05/30/16 1327 06/09/16 1950   05/30/16 1200  levofloxacin (LEVAQUIN) IVPB 750 mg  Status:  Discontinued     750 mg 100 mL/hr over 90 Minutes Intravenous Every 24 hours 05/29/16 1211 05/29/16 1705   05/30/16 0300  imipenem-cilastatin (PRIMAXIN) 500 mg in sodium chloride 0.9 % 100 mL IVPB  Status:  Discontinued     500 mg 200 mL/hr over 30 Minutes Intravenous Every 8 hours 05/29/16 1943 05/30/16 1327   05/29/16 2100  aztreonam (AZACTAM) 1 g in dextrose 5 % 50 mL IVPB  Status:  Discontinued     1 g 100 mL/hr over 30 Minutes Intravenous Every 8 hours 05/29/16 1211 05/29/16 1518   05/29/16 1800  imipenem-cilastatin (PRIMAXIN) 500 mg in sodium chloride 0.9 % 100 mL IVPB  Status:  Discontinued     500 mg 200 mL/hr over 30 Minutes Intravenous Every 6 hours 05/29/16 1721 05/29/16 1943   05/29/16 1400  linezolid (ZYVOX) IVPB 600 mg  Status:  Discontinued     600 mg 300 mL/hr over 60 Minutes Intravenous Every 12 hours 05/29/16 1320 06/04/16 1744   05/29/16 1045  levofloxacin (LEVAQUIN) IVPB 750 mg     750 mg 100 mL/hr over 90 Minutes Intravenous  Once 05/29/16 1031 05/29/16 1235   05/29/16 1045  aztreonam (AZACTAM) 2 g in dextrose 5 % 50 mL IVPB     2 g 100 mL/hr over 30 Minutes Intravenous  Once 05/29/16 1031 05/29/16 1330         ROS:    as in HPI otherwise remainder of 12 point Review of Systems is not obtainable due to patient's confusion   Blood pressure (!) 120/92, pulse 96, temperature (!) 96.8 F (36 C), temperature source Axillary, resp. rate 15, height 5' 10" (1.778 m), weight 158 lb (71.7 kg), SpO2 100 %.   General: Alert and awake,answers is no question in spite of shaking or nodding his head.  HEENT: anicteric sclera,  EOMI, oropharynx clear and without exudate Cardiovascular:tachy rate, normal r,  no murmur rubs or gallops Pulmonary: c rhonchi Gastrointestinal: soft nontender, nondistended, normal bowel sounds, Musculoskeletal contractures, bandages Skin,no rashes Neuro: no new focal deficits   Results for orders placed or performed during the hospital encounter of 05/29/16 (from the past 48 hour(s))  Glucose, capillary     Status: Abnormal   Collection Time: 06/08/16 12:18 PM  Result Value  Ref Range   Glucose-Capillary 106 (H) 65 - 99 mg/dL  Glucose, capillary     Status: Abnormal   Collection Time: 06/08/16  4:10 PM  Result Value Ref Range   Glucose-Capillary 107 (H) 65 - 99 mg/dL  Glucose, capillary     Status: None   Collection Time: 06/08/16  7:36 PM  Result Value Ref Range   Glucose-Capillary 95 65 - 99 mg/dL   Comment 1 Notify RN   Glucose, capillary     Status: Abnormal   Collection Time: 06/09/16 12:38 AM  Result Value Ref Range   Glucose-Capillary 109 (H) 65 - 99 mg/dL   Comment 1 Notify RN   Glucose, capillary     Status: Abnormal   Collection Time: 06/09/16  3:53 AM  Result Value Ref Range   Glucose-Capillary 108 (H) 65 - 99 mg/dL  CBC with Differential/Platelet     Status: Abnormal   Collection Time: 06/09/16  4:11 AM  Result Value Ref Range   WBC 16.3 (H) 4.0 - 10.5 K/uL   RBC 3.38 (L) 4.22 - 5.81 MIL/uL   Hemoglobin 10.4 (L) 13.0 - 17.0 g/dL   HCT 32.0 (L) 39.0 - 52.0 %   MCV 94.7 78.0 - 100.0 fL   MCH 30.8 26.0 - 34.0 pg   MCHC 32.5 30.0 - 36.0 g/dL   RDW 16.9 (H) 11.5 -  15.5 %   Platelets 99 (L) 150 - 400 K/uL    Comment: SPECIMEN CHECKED FOR CLOTS REPEATED TO VERIFY CONSISTENT WITH PREVIOUS RESULT PLATELET COUNT CONFIRMED BY SMEAR    Neutrophils Relative % 79 %   Lymphocytes Relative 9 %   Monocytes Relative 11 %   Eosinophils Relative 1 %   Basophils Relative 0 %   Neutro Abs 12.8 (H) 1.7 - 7.7 K/uL   Lymphs Abs 1.5 0.7 - 4.0 K/uL   Monocytes Absolute 1.8 (H) 0.1 - 1.0 K/uL   Eosinophils Absolute 0.2 0.0 - 0.7 K/uL   Basophils Absolute 0.0 0.0 - 0.1 K/uL   RBC Morphology TARGET CELLS    WBC Morphology VACUOLATED NEUTROPHILS   Magnesium     Status: None   Collection Time: 06/09/16  4:11 AM  Result Value Ref Range   Magnesium 2.1 1.7 - 2.4 mg/dL  Basic metabolic panel     Status: Abnormal   Collection Time: 06/09/16  4:11 AM  Result Value Ref Range   Sodium 142 135 - 145 mmol/L   Potassium 5.6 (H) 3.5 - 5.1 mmol/L    Comment: DELTA CHECK NOTED SPECIMEN HEMOLYZED. HEMOLYSIS MAY AFFECT INTEGRITY OF RESULTS.    Chloride 112 (H) 101 - 111 mmol/L   CO2 27 22 - 32 mmol/L   Glucose, Bld 106 (H) 65 - 99 mg/dL   BUN 32 (H) 6 - 20 mg/dL   Creatinine, Ser 0.39 (L) 0.61 - 1.24 mg/dL   Calcium 8.0 (L) 8.9 - 10.3 mg/dL   GFR calc non Af Amer >60 >60 mL/min   GFR calc Af Amer >60 >60 mL/min    Comment: (NOTE) The eGFR has been calculated using the CKD EPI equation. This calculation has not been validated in all clinical situations. eGFR's persistently <60 mL/min signify possible Chronic Kidney Disease.    Anion gap 3 (L) 5 - 15  Glucose, capillary     Status: Abnormal   Collection Time: 06/09/16  7:32 AM  Result Value Ref Range   Glucose-Capillary 108 (H) 65 - 99 mg/dL  Glucose, capillary  Status: Abnormal   Collection Time: 06/09/16 12:12 PM  Result Value Ref Range   Glucose-Capillary 108 (H) 65 - 99 mg/dL  Basic metabolic panel     Status: Abnormal   Collection Time: 06/09/16  2:10 PM  Result Value Ref Range   Sodium 140 135 - 145  mmol/L   Potassium 4.0 3.5 - 5.1 mmol/L    Comment: DELTA CHECK NOTED   Chloride 111 101 - 111 mmol/L   CO2 27 22 - 32 mmol/L   Glucose, Bld 105 (H) 65 - 99 mg/dL   BUN 28 (H) 6 - 20 mg/dL   Creatinine, Ser 0.34 (L) 0.61 - 1.24 mg/dL   Calcium 7.8 (L) 8.9 - 10.3 mg/dL   GFR calc non Af Amer >60 >60 mL/min   GFR calc Af Amer >60 >60 mL/min    Comment: (NOTE) The eGFR has been calculated using the CKD EPI equation. This calculation has not been validated in all clinical situations. eGFR's persistently <60 mL/min signify possible Chronic Kidney Disease.    Anion gap 2 (L) 5 - 15    Comment: REPEATED TO VERIFY  Glucose, capillary     Status: None   Collection Time: 06/09/16  4:24 PM  Result Value Ref Range   Glucose-Capillary 95 65 - 99 mg/dL  Glucose, capillary     Status: Abnormal   Collection Time: 06/09/16  8:05 PM  Result Value Ref Range   Glucose-Capillary 110 (H) 65 - 99 mg/dL   Comment 1 Notify RN   Glucose, capillary     Status: Abnormal   Collection Time: 06/10/16 12:12 AM  Result Value Ref Range   Glucose-Capillary 141 (H) 65 - 99 mg/dL  CBC with Differential/Platelet     Status: Abnormal   Collection Time: 06/10/16  4:00 AM  Result Value Ref Range   WBC 17.4 (H) 4.0 - 10.5 K/uL   RBC 3.34 (L) 4.22 - 5.81 MIL/uL   Hemoglobin 10.2 (L) 13.0 - 17.0 g/dL   HCT 31.8 (L) 39.0 - 52.0 %   MCV 95.2 78.0 - 100.0 fL   MCH 30.5 26.0 - 34.0 pg   MCHC 32.1 30.0 - 36.0 g/dL   RDW 16.0 (H) 11.5 - 15.5 %   Platelets 88 (L) 150 - 400 K/uL    Comment: CONSISTENT WITH PREVIOUS RESULT   Neutrophils Relative % 73 %   Lymphocytes Relative 9 %   Monocytes Relative 17 %   Eosinophils Relative 1 %   Basophils Relative 0 %   Neutro Abs 12.6 (H) 1.7 - 7.7 K/uL   Lymphs Abs 1.6 0.7 - 4.0 K/uL   Monocytes Absolute 3.0 (H) 0.1 - 1.0 K/uL   Eosinophils Absolute 0.2 0.0 - 0.7 K/uL   Basophils Absolute 0.0 0.0 - 0.1 K/uL   RBC Morphology TARGET CELLS    WBC Morphology ATYPICAL  LYMPHOCYTES   Magnesium     Status: None   Collection Time: 06/10/16  4:00 AM  Result Value Ref Range   Magnesium 1.7 1.7 - 2.4 mg/dL  Basic metabolic panel     Status: Abnormal   Collection Time: 06/10/16  4:00 AM  Result Value Ref Range   Sodium 144 135 - 145 mmol/L   Potassium 4.1 3.5 - 5.1 mmol/L   Chloride 112 (H) 101 - 111 mmol/L   CO2 29 22 - 32 mmol/L   Glucose, Bld 99 65 - 99 mg/dL   BUN 28 (H) 6 - 20 mg/dL   Creatinine,  Ser 0.35 (L) 0.61 - 1.24 mg/dL   Calcium 8.0 (L) 8.9 - 10.3 mg/dL   GFR calc non Af Amer >60 >60 mL/min   GFR calc Af Amer >60 >60 mL/min    Comment: (NOTE) The eGFR has been calculated using the CKD EPI equation. This calculation has not been validated in all clinical situations. eGFR's persistently <60 mL/min signify possible Chronic Kidney Disease.    Anion gap 3 (L) 5 - 15  Glucose, capillary     Status: None   Collection Time: 06/10/16  4:08 AM  Result Value Ref Range   Glucose-Capillary 84 65 - 99 mg/dL  Glucose, capillary     Status: Abnormal   Collection Time: 06/10/16  7:56 AM  Result Value Ref Range   Glucose-Capillary 133 (H) 65 - 99 mg/dL   _0 (sdes,specrequest,cult,reptstatus)   ) Recent Results (from the past 720 hour(s))  Blood Culture (routine x 2)     Status: None   Collection Time: 05/29/16 10:15 AM  Result Value Ref Range Status   Specimen Description BLOOD RIGHT ANTECUBITAL  Final   Special Requests BOTTLES DRAWN AEROBIC AND ANAEROBIC 5CC  Final   Culture NO GROWTH 5 DAYS  Final   Report Status 06/03/2016 FINAL  Final  Blood Culture (routine x 2)     Status: None   Collection Time: 05/29/16 10:25 AM  Result Value Ref Range Status   Specimen Description BLOOD RIGHT HAND  Final   Special Requests BOTTLES DRAWN AEROBIC AND ANAEROBIC 5CC  Final   Culture NO GROWTH 5 DAYS  Final   Report Status 06/03/2016 FINAL  Final  Urine culture     Status: Abnormal   Collection Time: 05/29/16 12:15 PM  Result Value Ref  Range Status   Specimen Description URINE, RANDOM  Final   Special Requests NONE  Final   Culture MULTIPLE SPECIES PRESENT, SUGGEST RECOLLECTION (A)  Final   Report Status 05/30/2016 FINAL  Final  MRSA PCR Screening     Status: None   Collection Time: 05/30/16  2:52 AM  Result Value Ref Range Status   MRSA by PCR NEGATIVE NEGATIVE Final    Comment:        The GeneXpert MRSA Assay (FDA approved for NASAL specimens only), is one component of a comprehensive MRSA colonization surveillance program. It is not intended to diagnose MRSA infection nor to guide or monitor treatment for MRSA infections.   Culture, Urine     Status: None   Collection Time: 05/31/16  3:20 PM  Result Value Ref Range Status   Specimen Description URINE, CATHETERIZED  Final   Special Requests NONE  Final   Culture NO GROWTH  Final   Report Status 06/01/2016 FINAL  Final  Culture, blood (routine x 2)     Status: None   Collection Time: 06/04/16  5:42 PM  Result Value Ref Range Status   Specimen Description BLOOD LEFT HAND  Final   Special Requests IN PEDIATRIC BOTTLE  3CC  Final   Culture NO GROWTH 5 DAYS  Final   Report Status 06/09/2016 FINAL  Final  Culture, blood (routine x 2)     Status: None   Collection Time: 06/04/16  5:50 PM  Result Value Ref Range Status   Specimen Description BLOOD LEFT HAND  Final   Special Requests BOTTLES DRAWN AEROBIC ONLY  5CC  Final   Culture NO GROWTH 5 DAYS  Final   Report Status 06/09/2016 FINAL  Final  C difficile  quick scan w PCR reflex     Status: None   Collection Time: 06/04/16  6:30 PM  Result Value Ref Range Status   C Diff antigen NEGATIVE NEGATIVE Final   C Diff toxin NEGATIVE NEGATIVE Final   C Diff interpretation No C. difficile detected.  Final  Urine culture     Status: None   Collection Time: 06/04/16  8:33 PM  Result Value Ref Range Status   Specimen Description URINE, CATHETERIZED  Final   Special Requests NONE  Final   Culture NO GROWTH  Final     Report Status 06/06/2016 FINAL  Final  Culture, respiratory (NON-Expectorated)     Status: None   Collection Time: 06/04/16 11:28 PM  Result Value Ref Range Status   Specimen Description TRACHEAL ASPIRATE  Final   Special Requests NONE  Final   Gram Stain   Final    RARE SQUAMOUS EPITHELIAL CELLS PRESENT FEW WBC PRESENT,BOTH PMN AND MONONUCLEAR MODERATE GRAM NEGATIVE RODS    Culture   Final    ABUNDANT STENOTROPHOMONAS MALTOPHILIA ABUNDANT PSEUDOMONAS AERUGINOSA ABUNDANT ACINETOBACTER CALCOACETICUS/BAUMANNII COMPLEX Results Called to: J. LINDSAY RN, AT [phone] 06/09/16 BY D. VANHOOK MULTI-DRUG RESISTANT ORGANISM    Report Status 06/10/2016 FINAL  Final   Organism ID, Bacteria PSEUDOMONAS AERUGINOSA  Final   Organism ID, Bacteria STENOTROPHOMONAS MALTOPHILIA  Final   Organism ID, Bacteria ACINETOBACTER CALCOACETICUS/BAUMANNII COMPLEX  Final      Susceptibility   Acinetobacter calcoaceticus/baumannii complex - MIC*    CEFTAZIDIME >=64 RESISTANT Resistant     CEFTRIAXONE >=64 RESISTANT Resistant     CIPROFLOXACIN >=4 RESISTANT Resistant     GENTAMICIN 8 INTERMEDIATE Intermediate     IMIPENEM >=16 RESISTANT Resistant     PIP/TAZO >=128 RESISTANT Resistant     TRIMETH/SULFA 80 RESISTANT Resistant     CEFEPIME >=64 RESISTANT Resistant     AMPICILLIN/SULBACTAM >=32 RESISTANT Resistant     * ABUNDANT ACINETOBACTER CALCOACETICUS/BAUMANNII COMPLEX   Pseudomonas aeruginosa - MIC*    CEFTAZIDIME 4 SENSITIVE Sensitive     CIPROFLOXACIN >=4 RESISTANT Resistant     GENTAMICIN >=16 RESISTANT Resistant     IMIPENEM >=16 RESISTANT Resistant     CEFEPIME 16 INTERMEDIATE Intermediate     * ABUNDANT PSEUDOMONAS AERUGINOSA   Stenotrophomonas maltophilia - MIC*    LEVOFLOXACIN 4 INTERMEDIATE Intermediate     * ABUNDANT STENOTROPHOMONAS MALTOPHILIA     Impression/Recommendation  Principal Problem:   Septic shock (HCC) Active Problems:   HCAP (healthcare-associated pneumonia)   Pressure  injury of skin   Quadriplegia (HCC)   Atrial fibrillation (HCC)   Diabetes mellitus without complication (HCC)   Chronic respiratory failure (HCC)   Pressure ulcer stage IV (HCC)   Encounter for wound care   Protein-calorie malnutrition, severe ([place])   Palliative care encounter   Goals of care, counseling/discussion   Chronic post traumatic encephalopathy   Acute encephalopathy   Hypokalemia   Hypomagnesemia   Urinary tract infection, site not specified   Acute on chronic respiratory failure with hypoxia (HCC)   Dysphagia   Decubitus ulcer of sacral area   Carlen Fils is a 55 y.o. 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 He was treated with broad-spectrum antibiotics and improved clinically though his white count went up which point in time tracheal aspirate was  sent for cultures that essentially given [place] multiple multidrug resistant organisms.  #1 Ventilator associated pneumonia: Clinically he seems to have responded to all the antibiotics that he was given--the majority of which including merrem and zyvox had ZERO activity vs the isolates from his tracheal aspirate.  I think at this point time the rational thing to do is to discontinue all of his antibiotics.  If he worsens in aggressive care is still desired I would have pulmonary cripple care come back and see the patient and perform a bronchoscopy for deep specimens from the lung to help guide therapy.  If the multidrug resistant Acinetobacter ends up being something we will need to treat then we may need to resort to polymyxin B or tigecycline.Zerbaxa is also something that may be an option BUT he is supposedly allergic to Cephalosporins so would need to be cautious  Greatly appreciate ID pharmacy Dimitri Ped looking into sensis and she will  try to get his Acinetobacter sent to [facility] for susceptibility testing for zerbaxa while we will try to test locally for polymyxin b, colistin and amikacin  IF we end up eating to resort to polymyxin B colistin or amikacin there is a high risk of severe nephrotoxicity.  #2 goals of care would strongly encourage the team during gauge with provide care in the family with regards to long-term goals as this patient in my opinion does not have a good prognosis.   I spent greater than 80  minutes with the patient including greater than 50% of time in face to face counsel and in  coordination of their care     06/10/2016, 11:48 AM   Thank you so much for this interesting consult  [place] for [place] [phone] (pager) [phone] (office) 06/10/2016, 11:48 AM  Rhina Br[place] 06/10/2016, 11:48 AM

## 2016-06-10 NOTE — Progress Notes (Addendum)
PROGRESS NOTE    Dean Graham  JYN:[id number] DOB: [birth year] DOA: 05/29/2016 PCP: Dean Griffes, MD   Brief Narrative:  55 y.o.BM SNF resident PMHx TBI, Quadriplegia , Chronic Trach-dependent respiratory failure, PEG tube, Neurogenic bladder with Chronic foley, Atrial fibrillation , Diastolic CHF, Anemia of chronic disease,Diabetes mellitus Type 2 without complication  Who was sent from [facility] 9/19 with fever, hypotension and increased work of breathing.  In ED, pt was febrile with T max of 100.7 F, hypotensive with BP of 72/60 without improvement after 4L IV fluids. He is white blood cell count was 23. Chest x-ray showed bilateral pleural effusions with pneumonia versus atelectasis. Patient was admitted to ICU for possible need for pressor support. Patient was transferred to stepdown unit 05/30/2016. On meropenem and Linezolid. Now with increasing white count   Subjective: On vent, moves head to answer yes and no questions, eyes open.  He denies pain.  Last night significant amount of lung Secretions was suction.    Assessment & Plan:   Principal Problem:   Septic shock ([place]) Active Problems:   HCAP (healthcare-associated pneumonia)   Pressure injury of skin   Quadriplegia (HCC)   Atrial fibrillation (HCC)   Diabetes mellitus without complication ([place])   Chronic respiratory failure ([place])   Pressure ulcer stage IV ([place])   Encounter for wound care   Protein-calorie malnutrition, severe ([place])   Palliative care encounter   Goals of care, counseling/discussion   Chronic post traumatic encephalopathy   Acute encephalopathy   Hypokalemia   Hypomagnesemia   Urinary tract infection, site not specified   Acute on chronic respiratory failure with hypoxia (HCC)   Dysphagia   Decubitus ulcer of sacral area   1-Septic shock/HCAP/ UTI  -Pt with chronic indwelling Foley catheter  - blood cx negative. -Patient remains tachycardic and hypotensive with increasing white count.  Continue antibiotics per ID -Chest x-ray on admission showed bilateral pleural effusions with pneumonia vs atelectasis -has recived  Albumin 50 g 2 - Continue midodrine 10 mg TID -C diff negative. Respiratory culture: Acinetobacter/Baumannin, Pseudomonas.  -. Blood culture 9-25 no growth to date.  --[place] negative for abscess.  --culture growing Pseudomonas, pan-reistant, only sensitive to ceftazidime. Patient with reported cephalosporin allergy, will request record from prior hospital, care everywhere reviewed, no information regarding cephalosporin allergy.  -Antibiotics change to Aztreonam and Bactrim, meropenem discontinue.   Acute on Chronic respiratory failure with hypoxia, trach-dependent - on vent  Mucinex.  -IV lasix time one.   Diarrhea;  Improving.  C diff negative 9-25 Add florastore.   Anemia of chronic disease - Baseline hemoglobin around 8 - S/P 2 unit PRBC transfusion on 9-27 -hb stable at 10  Mild troponin elevation - Due to demand ischemic from septic shock - Troponin 0.09 --> 0.07  - No further work up needed at this time  -ECHO no wall motion abnormalities.   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 Free water added.  Follow labs daily.   Hyperkalemia / Hypokalemia  - Repeat labs this afternoon.  Hemolysis notice on labs.   Hypomagnesemia -Magnesium goal> 2  Thrombocytopenia; related to infection.   Chronic diastolic CHF -Echocardiogram normal Ef  - one time dose of lasix today.   Chronic atrial fibrillation(CHADS vasc score 2) - On amiodarone and aspirin   Dysphagia, PEG tube-dependent /  Severe protein calorie malnutrition  - Chronic, stable  - Continue tube feeds   Quadriplegia - Stable   Neurogenic bladder - Chronic, stable -  Has Foley catheter in place  Seizure disorder - Continue keppra, vimpat  Stage 4 sacral pressure ulcer, stage 3 right and left hip sacral ulcer - Appreciate wound care assessment - Stage 4Sacrum 10cm x 8cm x 0.2cm with 100% red gran tissue, mod amtdrainage green, mild odor - Healing stage 3 righthip 2.5cm x 2 cm 100 % red granulating, no drainage - Left hip Stage 3 ulcer 4.5cm x 7cm x 1cm, 100% red granulating, modamt green drainage, mild odor  -Left great toe full thickness 0.8cm x 0.8cm 100% red gran tissue, no drainage or odor - L flank MARSI 0.5cm x 0.3cm pink and dry - L knee full thickness 2.5cm x 2cm red and moistno drainage or odor noted - L heel Stage 2 0.3cm x 0.3cm pink and moist no drainage or odor - Per WOC, apply quacell for sacrum and left hip to absorb drainage and provide antimicrobial benifitsand allevyn foam to the others -Placed on a mattress and ensure on rotation at all times   Goals of care Palliative care consulted.      DVT prophylaxis: Subcutaneous heparin  Code Status: DO NOT RESUSCITATE Family Communication:   Long discussion with patient 's mother, sister and another brother. Dean Graham tells me that she does not wants to rush thing, that she can not make him comfort yet if ariq is still alert or responding " [name] was able to move his head, to say no to pain " .. I explain to them that Dean Graham has a bacteria that is resistant to multiples antibiotics, and only sensitive to the antibiotics that he is allergic to. Explain to them poor prognosis and cycle of recurrent infection, not quality of life. Family is not ready for transition to comfort care, but they are willing to meet with Dean Graham for further goals of care.   Disposition Plan: Await further palliative care recommendation   Consultants:  Dr.Jeffrey C Dean Graham    Procedures/Significant Events:  None   Cultures 9/19 blood right  AC/hand negative 9/19 urine positive multiple species 9/20 MRSA by PCR negative 9/21 urine negative 9/25 blood left hand 2 NGTD 9/25 urine pending 9/25 tracheal aspirate positive GNR    Antimicrobials: Meropenem 9/20 >> Linezolid 9/19 >> 9/25 Aztreonam 9/19 >> 9/19 Levaquin 9/19 >>9/19   Devices None   LINES / TUBES:  Right midline PICC 9/19 >>    Continuous Infusions: . sodium chloride 20 mL/hr at 06/08/16 0942  . feeding supplement (VITAL AF 1.2 CAL) 1,000 mL (06/09/16 1615)     Objective: Vitals:   06/10/16 0000 06/10/16 0300 06/10/16 0422 06/10/16 0723  BP: (!) 119/91 (!) 115/98  124/90  Pulse:    (!) 103  Resp: _0 Temp: 97.6 F (36.4 C) 98.2 F (36.8 C)    TempSrc: Axillary Axillary    SpO2: 100% 100%  100%  Weight:   71.7 kg (158 lb)   Height:        Intake/Output Summary (Last 24 hours) at 06/10/16 0740 Last data filed at 06/10/16 4196  Gross per 24 hour  Intake             3510 ml  Output             1675 ml  Net  1835 ml   Filed Weights   06/08/16 0449 06/09/16 0317 06/10/16 0422  Weight: 73 kg (161 lb) 73.5 kg (162 lb) 71.7 kg (158 lb)    Examination:  General: A/O 0, positive chronic respiratory distress (on vent) Eyes: negative scleral hemorrhage, negative anisocoria, negative icterus, right eye swollen ENT: Negative Runny nose, negative gingival bleeding, Neck:  Negative scars, masses, torticollis, lymphadenopathy, JVD, trach  Lungs: coarse breath sounds diffusely, negative wheezes or crackles Cardiovascular: Regular rate and rhythm without murmur gallop or rub normal S1 and S2 Abdomen: negative abdominal pain, nondistended, positive soft, bowel sounds, no rebound, no ascites, no appreciable mass, PEG site covered and clean negative sign of infection Extremities: No significant cyanosis, clubbing. Plus bilateral lower extremity edema 2+ Skin: Multiple hip/sacral ulcers stage 3/4 (did not review today).    Psychiatric:  Unable to evaluate  Central nervous system:  Moves head to answer yes and no questions.   .     CBC:  Recent Labs Lab 06/06/16 0428 06/06/16 1216 06/07/16 0410 06/08/16 0450 06/09/16 0411 06/10/16 0400  WBC 15.2*  --  15.6* 18.7* 16.3* 17.4*  NEUTROABS 12.8*  --  12.5* 15.3* 12.8* 12.6*  HGB 7.0* 5.7* 10.0* 10.8* 10.4* 10.2*  HCT 23.1* 18.3* 30.7* 33.0* 32.0* 31.8*  MCV 99.6  --  94.5 94.0 94.7 95.2  PLT 161  --  114* 105* 99* 88*   Basic Metabolic Panel:  Recent Labs Lab 06/06/16 0630  06/07/16 0410 06/08/16 0450 06/09/16 0411 06/09/16 1410 06/10/16 0400  NA 147*  < > 144 143 142 140 144  K 5.2*  < > 4.8 4.4 5.6* 4.0 4.1  CL 114*  < > 116* 114* 112* 111 112*  CO2 24  < > _0 GLUCOSE 100*  < > 103* 88 106* 105* 99  BUN 34*  < > 35* 32* 32* 28* 28*  CREATININE 0.46*  < > 0.45* 0.40* 0.39* 0.34* 0.35*  CALCIUM 8.1*  < > 8.0* 8.4* 8.0* 7.8* 8.0*  MG 2.1  --  2.2 2.1 2.1  --  1.7  < > = values in this interval not displayed. GFR: Estimated Creatinine Clearance: 103.3 mL/min (by C-G formula based on SCr of 0.35 mg/dL (L)). Liver Function Tests:  Recent Labs Lab 06/05/16 0910 06/06/16 0630  AST 66* 74*  ALT 60 48  ALKPHOS 165* 122  BILITOT 0.8 0.8  PROT 4.7* 4.9*  ALBUMIN 1.2* 2.3*   No results for input(s): LIPASE, AMYLASE in the last 168 hours. No results for input(s): AMMONIA in the last 168 hours. Coagulation Profile: No results for input(s): INR, PROTIME in the last 168 hours. Cardiac Enzymes: No results for input(s): CKTOTAL, CKMB, CKMBINDEX, TROPONINI in the last 168 hours. BNP (last 3 results) No results for input(s): PROBNP in the last 8760 hours. HbA1C: No results for input(s): HGBA1C in the last 72 hours. CBG:  Recent Labs Lab 06/09/16 1212 06/09/16 1624 06/09/16 2005 06/10/16 0012 06/10/16 0408  GLUCAP 108* 95 110* 141* 84   Lipid Profile: No results for input(s): CHOL, HDL, LDLCALC, TRIG, CHOLHDL,  LDLDIRECT in the last 72 hours. Thyroid Function Tests: No results for input(s): TSH, T4TOTAL, FREET4, T3FREE, THYROIDAB in the last 72 hours. Anemia Panel: No results for input(s): VITAMINB12, FOLATE, FERRITIN, TIBC, IRON, RETICCTPCT in the last 72 hours. Urine analysis:    Component Value Date/Time   COLORURINE YELLOW 06/04/2016 2034   APPEARANCEUR CLEAR 06/04/2016 2034   LABSPEC 1.027  06/04/2016 2034   PHURINE 6.0 06/04/2016 2034   GLUCOSEU NEGATIVE 06/04/2016 2034   HGBUR NEGATIVE 06/04/2016 2034   BILIRUBINUR NEGATIVE 06/04/2016 2034   KETONESUR NEGATIVE 06/04/2016 2034   PROTEINUR NEGATIVE 06/04/2016 2034   NITRITE NEGATIVE 06/04/2016 2034   LEUKOCYTESUR NEGATIVE 06/04/2016 2034   Sepsis Labs: _0 (procalcitonin:4,lacticidven:4)  ) Recent Results (from the past 240 hour(s))  Culture, Urine     Status: None   Collection Time: 05/31/16  3:20 PM  Result Value Ref Range Status   Specimen Description URINE, CATHETERIZED  Final   Special Requests NONE  Final   Culture NO GROWTH  Final   Report Status 06/01/2016 FINAL  Final  Culture, blood (routine x 2)     Status: None   Collection Time: 06/04/16  5:42 PM  Result Value Ref Range Status   Specimen Description BLOOD LEFT HAND  Final   Special Requests IN PEDIATRIC BOTTLE  3CC  Final   Culture NO GROWTH 5 DAYS  Final   Report Status 06/09/2016 FINAL  Final  Culture, blood (routine x 2)     Status: None   Collection Time: 06/04/16  5:50 PM  Result Value Ref Range Status   Specimen Description BLOOD LEFT HAND  Final   Special Requests BOTTLES DRAWN AEROBIC ONLY  5CC  Final   Culture NO GROWTH 5 DAYS  Final   Report Status 06/09/2016 FINAL  Final  C difficile quick scan w PCR reflex     Status: None   Collection Time: 06/04/16  6:30 PM  Result Value Ref Range Status   C Diff antigen NEGATIVE NEGATIVE Final   C Diff toxin NEGATIVE NEGATIVE Final   C Diff interpretation No C. difficile detected.  Final  Urine  culture     Status: None   Collection Time: 06/04/16  8:33 PM  Result Value Ref Range Status   Specimen Description URINE, CATHETERIZED  Final   Special Requests NONE  Final   Culture NO GROWTH  Final   Report Status 06/06/2016 FINAL  Final  Culture, respiratory (NON-Expectorated)     Status: None (Preliminary result)   Collection Time: 06/04/16 11:28 PM  Result Value Ref Range Status   Specimen Description TRACHEAL ASPIRATE  Final   Special Requests NONE  Final   Gram Stain   Final    RARE SQUAMOUS EPITHELIAL CELLS PRESENT FEW WBC PRESENT,BOTH PMN AND MONONUCLEAR MODERATE GRAM NEGATIVE RODS    Culture   Final    ABUNDANT STENOTROPHOMONAS MALTOPHILIA ABUNDANT PSEUDOMONAS AERUGINOSA ABUNDANT ACINETOBACTER CALCOACETICUS/BAUMANNII COMPLEX CONFIRMING POSSIBLE MULTI DRUG RESISTANCE Results Called to: J. LINDSAY RN, AT 0092 06/09/16 BY D. Victoriano Lain    Report Status PENDING  Incomplete   Organism ID, Bacteria PSEUDOMONAS AERUGINOSA  Final      Susceptibility   Pseudomonas aeruginosa - MIC*    CEFTAZIDIME 4 SENSITIVE Sensitive     CIPROFLOXACIN >=4 RESISTANT Resistant     GENTAMICIN >=16 RESISTANT Resistant     IMIPENEM >=16 RESISTANT Resistant     CEFEPIME 16 INTERMEDIATE Intermediate     * ABUNDANT PSEUDOMONAS AERUGINOSA         Radiology Studies: No results found.      Scheduled Meds: . amiodarone  100 mg Oral Daily  . aspirin  81 mg Per Tube Daily  . aztreonam  2 g Intravenous Q8H  . chlorhexidine gluconate (MEDLINE KIT)  15 mL Mouth Rinse BID  . famotidine  20 mg Per Tube BID  . free water  200 mL Per Tube Q8H  . furosemide  40 mg Intravenous Once  . heparin  5,000 Units Subcutaneous Q8H  . insulin aspart  0-9 Units Subcutaneous Q4H  . lacosamide  100 mg Oral BID  . levETIRAcetam  500 mg Per Tube BID  . mouth rinse  15 mL Mouth Rinse QID  . midodrine  10 mg Oral TID WC  . sulfamethoxazole-trimethoprim  320 mg Intravenous Q8H   Continuous Infusions: . sodium  chloride 20 mL/hr at 06/08/16 0942  . feeding supplement (VITAL AF 1.2 CAL) 1,000 mL (06/09/16 1615)     LOS: 12 days    Time spent: 35 minutes    Elmarie Shiley, MD [facility] Pager [phone]  If 7PM-7AM, please contact night-coverage [email or web address] Password [email or web address] 06/10/2016, 7:40 AM

## 2016-06-11 DIAGNOSIS — Z515 Encounter for palliative care: Secondary | ICD-10-CM | POA: Diagnosis not present

## 2016-06-11 DIAGNOSIS — Z1619 Resistance to other specified beta lactam antibiotics: Secondary | ICD-10-CM

## 2016-06-11 DIAGNOSIS — A419 Sepsis, unspecified organism: Secondary | ICD-10-CM | POA: Diagnosis not present

## 2016-06-11 DIAGNOSIS — E8809 Other disorders of plasma-protein metabolism, not elsewhere classified: Secondary | ICD-10-CM

## 2016-06-11 DIAGNOSIS — R6521 Severe sepsis with septic shock: Secondary | ICD-10-CM | POA: Diagnosis not present

## 2016-06-11 DIAGNOSIS — L89150 Pressure ulcer of sacral region, unstageable: Secondary | ICD-10-CM

## 2016-06-11 DIAGNOSIS — A498 Other bacterial infections of unspecified site: Secondary | ICD-10-CM

## 2016-06-11 DIAGNOSIS — R509 Fever, unspecified: Secondary | ICD-10-CM | POA: Diagnosis not present

## 2016-06-11 LAB — BASIC METABOLIC PANEL
Anion gap: 4 — ABNORMAL LOW (ref 5–15)
BUN: 26 mg/dL — AB (ref 6–20)
CHLORIDE: 108 mmol/L (ref 101–111)
CO2: 29 mmol/L (ref 22–32)
CREATININE: 0.38 mg/dL — AB (ref 0.61–1.24)
Calcium: 7.9 mg/dL — ABNORMAL LOW (ref 8.9–10.3)
GFR calc Af Amer: >60 mL/min (ref >60)
GFR calc non Af Amer: >60 mL/min (ref >60)
GLUCOSE: 107 mg/dL — AB (ref 65–99)
POTASSIUM: 4.5 mmol/L (ref 3.5–5.1)
Sodium: 141 mmol/L (ref 135–145)

## 2016-06-11 LAB — GLUCOSE, CAPILLARY
GLUCOSE-CAPILLARY: 100 mg/dL — AB (ref 65–99)
GLUCOSE-CAPILLARY: 105 mg/dL — AB (ref 65–99)
GLUCOSE-CAPILLARY: 108 mg/dL — AB (ref 65–99)
GLUCOSE-CAPILLARY: 109 mg/dL — AB (ref 65–99)
Glucose-Capillary: 116 mg/dL — ABNORMAL HIGH (ref 65–99)
Glucose-Capillary: 104 mg/dL — ABNORMAL HIGH (ref 65–99)

## 2016-06-11 LAB — CBC WITH DIFFERENTIAL/PLATELET
BASOS ABS: 0 10*3/uL (ref 0.0–0.1)
Basophils Relative: 0 %
Eosinophils Absolute: 0.1 10*3/uL (ref 0.0–0.7)
Eosinophils Relative: 1 %
HEMATOCRIT: 31 % — AB (ref 39.0–52.0)
HEMOGLOBIN: 10 g/dL — AB (ref 13.0–17.0)
LYMPHS ABS: 2.1 10*3/uL (ref 0.7–4.0)
Lymphocytes Relative: 15 %
MCH: 30.3 pg (ref 26.0–34.0)
MCHC: 32.3 g/dL (ref 30.0–36.0)
MCV: 93.9 fL (ref 78.0–100.0)
MONO ABS: 1.2 10*3/uL — AB (ref 0.1–1.0)
MONOS PCT: 9 %
NEUTROS ABS: 10.3 10*3/uL — AB (ref 1.7–7.7)
Neutrophils Relative %: 75 %
Platelets: 108 10*3/uL — ABNORMAL LOW (ref 150–400)
RBC: 3.3 MIL/uL — AB (ref 4.22–5.81)
RDW: 16.1 % — AB (ref 11.5–15.5)
WBC: 13.7 10*3/uL — AB (ref 4.0–10.5)

## 2016-06-11 LAB — MAGNESIUM: MAGNESIUM: 1.9 mg/dL (ref 1.7–2.4)

## 2016-06-11 MED ORDER — FUROSEMIDE 10 MG/ML IJ SOLN
20.0000 mg | Freq: Once | INTRAMUSCULAR | Status: AC
  Administered 2016-06-11: 20 mg via INTRAVENOUS
  Filled 2016-06-11: qty 2

## 2016-06-11 NOTE — Progress Notes (Addendum)
Daily Progress Note   Patient Name: Dean Graham       Date: 06/11/2016 DOB: May 03, 1961  Age: 55 y.o. MRN#: [id number] Attending Physician: Elmarie Shiley, MD Primary Care Physician: Verneita Griffes, MD Admit Date: 05/29/2016  Reason for Consultation/Follow-up: Establishing goals of care  Subjective: Examined the patient at bedside.  He is more alert today.  Opened eyes.  I asked if he was in any pain and he vigorously shook his head no.  He nodded "yes" to the question - Do you want the TV on?   And then "yes" again to tell me he wanted it on the movie channel.  I spoke with [name], his brother, on the phone.  [name] wanted a detailed medical update.  He mentions he wife works for [employer] who just introduced a new antibiotic to [employer]  He will text me the name of the antibiotic to see if Antwyne's MDR bacteria are sensitive to it.      [name] believes that Renso will indicate when he is ready to make a change to comfort care and pass.   He does not believe anyone else should make that decision.   [name] indicates that his mother is emotional.  "She wants to take him home and let him pass".   Per [name] the patient's reaction to ceftriaxone and azith is that he will get "blisters all over his body".    I spoke with Mrs. Lyman on the phone.  She is emotionally exhausted and is having a very difficult time caring for Nafis.  "I'll be ok if I can just get him crossed".   She is coming to [facility] tomorrow to stay a few days and visit her son.     Length of Stay: 13  Current Medications: Scheduled Meds:  . amiodarone  100 mg Oral Daily  . aspirin  81 mg Per Tube Daily  . chlorhexidine gluconate (MEDLINE KIT)  15 mL Mouth Rinse BID  . famotidine  20 mg Per Tube BID  . free water   200 mL Per Tube Q8H  . heparin  5,000 Units Subcutaneous Q8H  . insulin aspart  0-9 Units Subcutaneous Q4H  . lacosamide  100 mg Oral BID  . levETIRAcetam  500 mg Per Tube BID  . mouth rinse  15 mL Mouth  Rinse QID  . midodrine  10 mg Oral TID WC  . saccharomyces boulardii  250 mg Oral BID    Continuous Infusions: . sodium chloride 20 mL/hr at 06/08/16 0942  . feeding supplement (VITAL AF 1.2 CAL) 1,000 mL (06/11/16 0158)    PRN Meds: guaiFENesin, hydroxypropyl methylcellulose / hypromellose, sodium chloride flush  Physical Exam         55 yo male with trach (on vent), Peg (NPO), LE contractures.  Nods yes and no appropriately to questions. Swelling noted in face, eye lids and hands.  Attempts to open eyes. Trach in place, on vent, settings unchanged, NAD CV tachy Abdomen PEG in place.  Scrotal swelling noted.  Vital Signs: BP 106/80   Pulse 98   Temp (!) 96.8 F (36 C) (Axillary)   Resp (!) 9   Ht '5\' 10"'$  (1.778 m)   Wt 71.7 kg (158 lb)   SpO2 100%   BMI 22.67 kg/m  SpO2: SpO2: 100 % O2 Device: O2 Device: Ventilator O2 Flow Rate: O2 Flow Rate (L/min): 5 L/min  Intake/output summary:   Intake/Output Summary (Last 24 hours) at 06/11/16 1127 Last data filed at 06/11/16 1034  Gross per 24 hour  Intake             1920 ml  Output             3420 ml  Net            -1500 ml   LBM: Last BM Date: 06/10/16 Baseline Weight: Weight: 45.4 kg (100 lb) Most recent weight: Weight: 71.7 kg (158 lb)       Palliative Assessment/Data:    Flowsheet Rows   Flowsheet Row Most Recent Value  Intake Tab  Referral Department  Hospitalist  Unit at Time of Referral  Intermediate Care Unit  Palliative Care Primary Diagnosis  Neurology  Date Notified  05/31/16  Palliative Care Type  New Palliative care  Reason for referral  Clarify Goals of Care  Date of Admission  05/29/16  Date first seen by Palliative Care  06/01/16  # of days Palliative referral response time  1 Day(s)  #  of days IP prior to Palliative referral  2  Clinical Assessment  Palliative Performance Scale Score  10%  Psychosocial & Spiritual Assessment  Palliative Care Outcomes  Patient/Family meeting held?  Yes  Who was at the meeting?  Brother Social research officer, government and Mother  Palliative Care Outcomes  Clarified goals of care  Palliative Care follow-up planned  Yes, Facility      Patient Active Problem List   Diagnosis Date Noted  . Cough with frothy sputum   . VAP (ventilator-associated pneumonia) ([place])   . Carbapenem-resistant Acinetobacter baumannii infection   . History of infection by MDR Stenotrophomonas maltophilia   . History of MDR Pseudomonas aeruginosa infection   . Chronic post traumatic encephalopathy   . Acute encephalopathy   . Hypokalemia   . Hypomagnesemia   . Urinary tract infection, site not specified   . Acute on chronic respiratory failure with hypoxia ([place])   . Dysphagia   . Decubitus ulcer of sacral area   . Encounter for wound care   . Protein-calorie malnutrition, severe ([place])   . Palliative care encounter   . Goals of care, counseling/discussion   . Pressure injury of skin 05/31/2016  . Quadriplegia ([place]) 05/31/2016  . Atrial fibrillation ([place]) 05/31/2016  . Diabetes mellitus without complication ([place]) 79/89/2119  . Chronic respiratory failure ([place])  05/31/2016  . 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 Septic shock ([place]) 05/29/2016  . HCAP (healthcare-associated pneumonia)     Palliative Care Assessment & Plan   Patient Profile: 55 y.o. male  with past medical history of traumatic brain injury, quadriplegia, dysphagia / peg dependent, atrial fibrillation, diabetes mellitus, diastolic heart failure and chronic respiratory failure/trach dependent who was admitted on 05/29/2016 with severe sepsis thought to be secondary to HCAP as well as urinary source.  He was admitted to the ICU and treated with broad spectrum antibiotics and short term pressors. He is currently vent  dependent and found to have multiple pressure wounds including a stage 4 left hip wound and a stage 3 right hip wound.  He is bed ridden. He has recurrent aspiration.  His albumin was 1.2 (!) on 9/26 and his lactic acid was elevated.  On 9/27 he was significantly hypotensive and his hgb had dropped to 5.7.  He received PRBC transfusion overnight 9/27 and his BP has stabilized.  10/2.  Patient with 3 types of multidrug resistant organisms in his sputum it is undetermined whether these are active infections or colonizers. Appreciate the work ID is doing.  Assessment: Patient is very slowly dying.  He is being kept alive by midodrine, antibiotics and ventilator support.  Mother and sisters are considering comfort measures.  Brother [name] is still hoping for more time.  I don't see this interfamily conflict being resolved any time soon. .  Recommendations/Plan:  Would not escalate care.  Continue current treatments.  If possible stabilize him and d/c to a facility near [facility].  Will continue to work with the family on moving to comfort measures, but this may not happen this admission given interfamily conflict.   Code Status: DNR / DNI  Prognosis:   < 6 weeks perhaps significantly less with continued aspiration and infections.  Discharge Planning:  [place] for rehab with Palliative care service follow-up VS hospital death if the family opts for comfort measures.  Care plan was discussed with the patient's mother, brother, and Dr. Tommy Medal  Thank you for allowing the Palliative Medicine Team to assist in the care of this patient.   Time In: 11:00 Time Out: 11:35 Total Time 35 min Prolonged Time Billed no      Greater than 50%  of this time was spent counseling and coordinating care related to the above assessment and plan.  Melton Alar, PA-C  Please contact Palliative Medicine Team phone at [phone] for questions and concerns.

## 2016-06-11 NOTE — Progress Notes (Signed)
No Charge note    Family confirms the following reactions to the medications listed as allergies -   Azithromycin - the patient contracted C-diff  Vancomycin - he developed Levonne Spiller syndrome and was treated at the burn unit at [facility] - made the patient extremely sleepy.  Ceftriaxone - the family does not recall what happened.

## 2016-06-11 NOTE — Progress Notes (Signed)
CSW faxed clinicals to [facility] and [facility], since [facility] does not have a bed.  Osborne Casco Jarl Sellitto LCSWA [phone]

## 2016-06-11 NOTE — Progress Notes (Signed)
PROGRESS NOTE    Dean Graham  DJM:[id number] DOB: 1961-05-15 DOA: 05/29/2016 PCP: Dean Griffes, MD   Brief Narrative:  55 y.o.BM [facility] resident PMHx TBI, Quadriplegia , Chronic Trach-dependent respiratory failure, PEG tube, Neurogenic bladder with Chronic foley, Atrial fibrillation , Diastolic CHF, Anemia of chronic disease,Diabetes mellitus Type 2 without complication  Who was sent from [facility] 9/19 with fever, hypotension and increased work of breathing.  In ED, pt was febrile with T max of 100.7 F, hypotensive with BP of 72/60 without improvement after 4L IV fluids. He is white blood cell count was 23. Chest x-ray showed bilateral pleural effusions with pneumonia versus atelectasis. Patient was admitted to ICU for possible need for pressor support. Patient was transferred to stepdown unit 05/30/2016. On meropenem and Linezolid. Now with increasing white count   Subjective: On vent, moves head to answer yes and no questions. He denies pain.    Assessment & Plan:   Principal Problem:   Septic shock (Dean Graham) Active Problems:   HCAP (healthcare-associated pneumonia)   Pressure injury of skin   Quadriplegia (Dean Graham)   Atrial fibrillation (Dean Graham)   Diabetes mellitus without complication (Dean Graham)   Chronic respiratory failure (Dean Graham)   Pressure ulcer stage IV   Encounter for wound care   Protein-calorie malnutrition, severe (Dean Graham)   Palliative care encounter   Goals of care, counseling/discussion   Chronic post traumatic encephalopathy   Acute encephalopathy   Hypokalemia   Hypomagnesemia   Urinary tract infection, site not specified   Acute on chronic respiratory failure with hypoxia (Dean Graham)   Dysphagia   Decubitus ulcer of sacral area   Cough with frothy sputum   VAP (ventilator-associated pneumonia) (Dean Graham)   Carbapenem-resistant Acinetobacter baumannii infection   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 -Patient remains tachycardic and hypotensive with increasing white count. Continue antibiotics per Dean Graham -Chest x-ray on admission showed bilateral pleural effusions with pneumonia vs atelectasis -has recived  Albumin 50 g 2 - Continue midodrine 10 mg TID -C diff negative. Respiratory culture: Acinetobacter/Baumannin, Pseudomonas.  -. Blood culture 9-25 no growth to date.  --[place] negative for abscess.  --culture growing Pseudomonas, pan-reistant, only sensitive to ceftazidime. Patient with reported cephalosporin allergy, will request record from prior hospital, care everywhere reviewed, no information regarding cephalosporin allergy.  -Antibiotics change to Aztreonam and Bactrim, received one day treatment. Off antibiotics per Dean Graham.  - meropenem discontinue.  -wbc trending down today.   Acute on Chronic respiratory failure with hypoxia, trach-dependent - on vent  Mucinex.  -IV lasix PRN  Diarrhea;  Still with diarrhea.  C diff negative 9-25 Added  florastore.   Anemia of chronic disease - Baseline hemoglobin around 8 - S/P 2 unit PRBC transfusion on 9-27 -hb stable at 10  Mild troponin elevation - Due to demand ischemic from septic shock - Troponin 0.09 --> 0.07  - No further work up needed at this time  -ECHO no wall motion abnormalities.   Acute encephalopathy/Chronic Encephalopathy secondary to TBI as a child - Due to septic shock - able to move head to answer yes and no.   Hypernatremia; resolved, stop IV fluids. Free water added.  Follow labs daily.   Hyperkalemia / Hypokalemia  - Repeat labs this afternoon.  Hemolysis notice on labs.   Hypomagnesemia -Magnesium goal> 2  Thrombocytopenia; related to infection.   Chronic  diastolic CHF -Echocardiogram normal Ef  - one time dose of lasix today.   Chronic atrial fibrillation(CHADS vasc score 2) - On  amiodarone and aspirin   Dysphagia, PEG tube-dependent / Severe protein calorie malnutrition  - Chronic, stable  - Continue tube feeds   Quadriplegia - Stable   Neurogenic bladder - Chronic, stable - Has Foley catheter in place  Seizure disorder - Continue keppra, vimpat  Stage 4 sacral pressure ulcer, stage 3 right and left hip sacral ulcer - Appreciate wound care assessment - Stage 4Sacrum 10cm x 8cm x 0.2cm with 100% red gran tissue, mod amtdrainage green, mild odor - Healing stage 3 righthip 2.5cm x 2 cm 100 % red granulating, no drainage - Left hip Stage 3 ulcer 4.5cm x 7cm x 1cm, 100% red granulating, modamt green drainage, mild odor  -Left great toe full thickness 0.8cm x 0.8cm 100% red gran tissue, no drainage or odor - L flank MARSI 0.5cm x 0.3cm pink and dry - L knee full thickness 2.5cm x 2cm red and moistno drainage or odor noted - L heel Stage 2 0.3cm x 0.3cm pink and moist no drainage or odor - Per WOC, apply quacell for sacrum and left hip to absorb drainage and provide antimicrobial benifitsand allevyn foam to the others -Placed on a mattress and ensure on rotation at all times   Goals of care Palliative care consulted.      Dean Graham: Subcutaneous heparin  Code Status: DO NOT RESUSCITATE Family Communication:   Long discussion with patient 's mother, sister and another brother on 10-01:. Dean Graham tells me that she does not wants to rush things, that she can not make him comfort yet if Dean Graham is still alert or responding " Dean Graham was able to move his head, to say no to pain " .. I explain to them that Dean Graham has a bacteria that is resistant to multiples antibiotics, and only sensitive to the antibiotics that he is allergic to. Explain to them poor prognosis and cycle of recurrent infection, not quality of life. Family is not ready for transition to comfort care, but they are willing to meet with Dean Graham with palliative care   for further goals of care.   Disposition Plan: Await further palliative care recommendation   Consultants:  Dean Graham Dean Graham [facility]    Procedures/Significant Events:  None   Cultures 9/19 blood right AC/hand negative 9/19 urine positive multiple species 9/20 MRSA by PCR negative 9/21 urine negative 9/25 blood left hand 2 NGTD 9/25 urine pending 9/25 tracheal aspirate positive GNR    Antimicrobials: Meropenem 9/20 >> Linezolid 9/19 >> 9/25 Aztreonam 9/19 >> 9/19 Levaquin 9/19 >>9/19   Devices None   LINES / TUBES:  Right midline PICC 9/19 >>    Continuous Infusions: . sodium chloride 20 mL/hr at 06/08/16 0942  . feeding supplement (VITAL AF 1.2 CAL) 1,000 mL (06/11/16 0158)     Objective: Vitals:   06/11/16 0401 06/11/16 0757 06/11/16 0800 06/11/16 0816  BP: 102/84 (!) 107/91 104/78   Pulse: 100 (!) 105 (!) 101   Resp: 14  (!) 0   Temp:  (!) 96.8 F (36 C)    TempSrc:  Axillary    SpO2: 100%  100% 100%  Weight:      Height:        Intake/Output Summary (Last 24 hours) at 06/11/16 0831 Last data filed at 06/11/16 0318  Gross per 24 hour  Intake  1050 ml  Output             2245 ml  Net            -1195 ml   Filed Weights   06/08/16 0449 06/09/16 0317 06/10/16 0422  Weight: 73 kg (161 lb) 73.5 kg (162 lb) 71.7 kg (158 lb)    Examination:  General: A/O 0, positive chronic respiratory distress (on vent) Eyes: negative scleral hemorrhage, negative anisocoria, negative icterus, right eye swollen ENT: Negative Runny nose, negative gingival bleeding, Neck:  Negative scars, masses, torticollis, lymphadenopathy, JVD, trach  Lungs: coarse breath sounds diffusely, negative wheezes or crackles Cardiovascular: Regular rate and rhythm without murmur gallop or rub normal S1 and S2 Abdomen: negative abdominal pain, nondistended, positive soft, bowel sounds, no rebound, no ascites, no appreciable mass, PEG site  covered and clean negative sign of infection Extremities: No significant cyanosis, clubbing. Plus bilateral lower extremity edema 2+ Skin: Multiple hip/sacral ulcers stage 3/4 (did not review today).  Psychiatric:  Unable to evaluate  Central nervous system:  Moves head to answer yes and no questions.   .     CBC:  Recent Labs Lab 06/07/16 0410 06/08/16 0450 06/09/16 0411 06/10/16 0400 06/11/16 0457  WBC 15.6* 18.7* 16.3* 17.4* 13.7*  NEUTROABS 12.5* 15.3* 12.8* 12.6* 10.3*  HGB 10.0* 10.8* 10.4* 10.2* 10.0*  HCT 30.7* 33.0* 32.0* 31.8* 31.0*  MCV 94.5 94.0 94.7 95.2 93.9  PLT 114* 105* 99* 88* 563*   Basic Metabolic Panel:  Recent Labs Lab 06/07/16 0410 06/08/16 0450 06/09/16 0411 06/09/16 1410 06/10/16 0400 06/11/16 0457  NA 144 143 142 140 144 141  K 4.8 4.4 5.6* 4.0 4.1 4.5  CL 116* 114* 112* 111 112* 108  CO2 '25 26 27 27 29 29  '$ GLUCOSE 103* 88 106* 105* 99 107*  BUN 35* 32* 32* 28* 28* 26*  CREATININE 0.45* 0.40* 0.39* 0.34* 0.35* 0.38*  CALCIUM 8.0* 8.4* 8.0* 7.8* 8.0* 7.9*  MG 2.2 2.1 2.1  --  1.7 1.9   GFR: Estimated Creatinine Clearance: 103.3 mL/min (by C-G formula based on SCr of 0.38 mg/dL (L)). Liver Function Tests:  Recent Labs Lab 06/05/16 0910 06/06/16 0630  AST 66* 74*  ALT 60 48  ALKPHOS 165* 122  BILITOT 0.8 0.8  PROT 4.7* 4.9*  ALBUMIN 1.2* 2.3*   No results for input(s): LIPASE, AMYLASE in the last 168 hours. No results for input(s): AMMONIA in the last 168 hours. Coagulation Profile: No results for input(s): INR, PROTIME in the last 168 hours. Cardiac Enzymes: No results for input(s): CKTOTAL, CKMB, CKMBINDEX, TROPONINI in the last 168 hours. BNP (last 3 results) No results for input(s): PROBNP in the last 8760 hours. HbA1C: No results for input(s): HGBA1C in the last 72 hours. CBG:  Recent Labs Lab 06/10/16 1555 06/10/16 1923 06/10/16 2307 06/11/16 0252 06/11/16 0755  GLUCAP 109* 81 94 116* 104*   Lipid  Profile: No results for input(s): CHOL, HDL, LDLCALC, TRIG, CHOLHDL, LDLDIRECT in the last 72 hours. Thyroid Function Tests: No results for input(s): TSH, T4TOTAL, FREET4, T3FREE, THYROIDAB in the last 72 hours. Anemia Panel: No results for input(s): VITAMINB12, FOLATE, FERRITIN, TIBC, IRON, RETICCTPCT in the last 72 hours. Urine analysis:    Component Value Date/Time   COLORURINE YELLOW 06/04/2016 2034   APPEARANCEUR CLEAR 06/04/2016 2034   LABSPEC 1.027 06/04/2016 2034   PHURINE 6.0 06/04/2016 2034   GLUCOSEU NEGATIVE 06/04/2016 2034   HGBUR NEGATIVE 06/04/2016 2034  BILIRUBINUR NEGATIVE 06/04/2016 2034   KETONESUR NEGATIVE 06/04/2016 2034   PROTEINUR NEGATIVE 06/04/2016 2034   NITRITE NEGATIVE 06/04/2016 2034   LEUKOCYTESUR NEGATIVE 06/04/2016 2034   Sepsis Labs: '@LABRCNTIP'$ (procalcitonin:4,lacticidven:4)  ) Recent Results (from the past 240 hour(s))  Culture, blood (routine x 2)     Status: None   Collection Time: 06/04/16  5:42 PM  Result Value Ref Range Status   Specimen Description BLOOD LEFT HAND  Final   Special Requests IN PEDIATRIC BOTTLE  3CC  Final   Culture NO GROWTH 5 DAYS  Final   Report Status 06/09/2016 FINAL  Final  Culture, blood (routine x 2)     Status: None   Collection Time: 06/04/16  5:50 PM  Result Value Ref Range Status   Specimen Description BLOOD LEFT HAND  Final   Special Requests BOTTLES DRAWN AEROBIC ONLY  5CC  Final   Culture NO GROWTH 5 DAYS  Final   Report Status 06/09/2016 FINAL  Final  C difficile quick scan w PCR reflex     Status: None   Collection Time: 06/04/16  6:30 PM  Result Value Ref Range Status   C Diff antigen NEGATIVE NEGATIVE Final   C Diff toxin NEGATIVE NEGATIVE Final   C Diff interpretation No C. difficile detected.  Final  Urine culture     Status: None   Collection Time: 06/04/16  8:33 PM  Result Value Ref Range Status   Specimen Description URINE, CATHETERIZED  Final   Special Requests NONE  Final   Culture NO  GROWTH  Final   Report Status 06/06/2016 FINAL  Final  Culture, respiratory (NON-Expectorated)     Status: None   Collection Time: 06/04/16 11:28 PM  Result Value Ref Range Status   Specimen Description TRACHEAL ASPIRATE  Final   Special Requests NONE  Final   Gram Stain   Final    RARE SQUAMOUS EPITHELIAL CELLS PRESENT FEW WBC PRESENT,BOTH PMN AND MONONUCLEAR MODERATE GRAM NEGATIVE RODS    Culture   Final    ABUNDANT STENOTROPHOMONAS MALTOPHILIA ABUNDANT PSEUDOMONAS AERUGINOSA ABUNDANT ACINETOBACTER CALCOACETICUS/BAUMANNII COMPLEX Results Called to: J. LINDSAY RN, AT [phone] 06/09/16 BY D. VANHOOK MULTI-DRUG RESISTANT ORGANISM    Report Status 06/10/2016 FINAL  Final   Organism [facility], Bacteria PSEUDOMONAS AERUGINOSA  Final   Organism [facility], Bacteria STENOTROPHOMONAS MALTOPHILIA  Final   Organism [facility], Bacteria ACINETOBACTER CALCOACETICUS/BAUMANNII COMPLEX  Final      Susceptibility   Acinetobacter calcoaceticus/baumannii complex - MIC*    CEFTAZIDIME >=64 RESISTANT Resistant     CEFTRIAXONE >=64 RESISTANT Resistant     CIPROFLOXACIN >=4 RESISTANT Resistant     GENTAMICIN 8 INTERMEDIATE Intermediate     IMIPENEM >=16 RESISTANT Resistant     PIP/TAZO >=128 RESISTANT Resistant     TRIMETH/SULFA 80 RESISTANT Resistant     CEFEPIME >=64 RESISTANT Resistant     AMPICILLIN/SULBACTAM >=32 RESISTANT Resistant     * ABUNDANT ACINETOBACTER CALCOACETICUS/BAUMANNII COMPLEX   Pseudomonas aeruginosa - MIC*    CEFTAZIDIME 4 SENSITIVE Sensitive     CIPROFLOXACIN >=4 RESISTANT Resistant     GENTAMICIN >=16 RESISTANT Resistant     IMIPENEM >=16 RESISTANT Resistant     CEFEPIME 16 INTERMEDIATE Intermediate     * ABUNDANT PSEUDOMONAS AERUGINOSA   Stenotrophomonas maltophilia - MIC*    LEVOFLOXACIN 4 INTERMEDIATE Intermediate     * ABUNDANT STENOTROPHOMONAS MALTOPHILIA         Radiology Studies: Dg Chest Port 1 Graham  Result Date: 06/10/2016 CLINICAL  DATA:  Respiratory failure EXAM: PORTABLE  CHEST 1 Graham COMPARISON:  06/05/2016 FINDINGS: Tracheostomy to unchanged. Potential abandoned catheter in the RIGHT neck. Stable cardiac silhouette. There bilateral pleural effusions not changed. Upper lungs are relatively clear IMPRESSION: No significant change.  Stable support apparatus. Bilateral moderate pleural effusions. Electronically Signed   By: Suzy Bouchard Graham.D.   On: 06/10/2016 11:29        Scheduled Meds: . amiodarone  100 mg Oral Daily  . aspirin  81 mg Per Tube Daily  . chlorhexidine gluconate (MEDLINE KIT)  15 mL Mouth Rinse BID  . famotidine  20 mg Per Tube BID  . free water  200 mL Per Tube Q8H  . furosemide  20 mg Intravenous Once  . heparin  5,000 Units Subcutaneous Q8H  . insulin aspart  0-9 Units Subcutaneous Q4H  . lacosamide  100 mg Oral BID  . levETIRAcetam  500 mg Per Tube BID  . mouth rinse  15 mL Mouth Rinse QID  . midodrine  10 mg Oral TID WC  . saccharomyces boulardii  250 mg Oral BID   Continuous Infusions: . sodium chloride 20 mL/hr at 06/08/16 0942  . feeding supplement (VITAL AF 1.2 CAL) 1,000 mL (06/11/16 0158)     LOS: 13 days    Time spent: 35 minutes    Elmarie Shiley, MD [facility] Pager [phone]  If 7PM-7AM, please contact night-coverage [email or web address] Password [email or web address] 06/11/2016, 8:31 AM

## 2016-06-12 ENCOUNTER — Inpatient Hospital Stay (HOSPITAL_COMMUNITY): Payer: Medicare Other

## 2016-06-12 DIAGNOSIS — Z1624 Resistance to multiple antibiotics: Secondary | ICD-10-CM | POA: Diagnosis not present

## 2016-06-12 DIAGNOSIS — R58 Hemorrhage, not elsewhere classified: Secondary | ICD-10-CM | POA: Diagnosis not present

## 2016-06-12 DIAGNOSIS — Z515 Encounter for palliative care: Secondary | ICD-10-CM | POA: Diagnosis not present

## 2016-06-12 DIAGNOSIS — R509 Fever, unspecified: Secondary | ICD-10-CM | POA: Diagnosis not present

## 2016-06-12 DIAGNOSIS — Z430 Encounter for attention to tracheostomy: Secondary | ICD-10-CM

## 2016-06-12 DIAGNOSIS — J95851 Ventilator associated pneumonia: Secondary | ICD-10-CM | POA: Diagnosis not present

## 2016-06-12 DIAGNOSIS — Z7189 Other specified counseling: Secondary | ICD-10-CM | POA: Diagnosis not present

## 2016-06-12 DIAGNOSIS — R532 Functional quadriplegia: Secondary | ICD-10-CM | POA: Diagnosis not present

## 2016-06-12 DIAGNOSIS — B9689 Other specified bacterial agents as the cause of diseases classified elsewhere: Secondary | ICD-10-CM | POA: Diagnosis not present

## 2016-06-12 DIAGNOSIS — A419 Sepsis, unspecified organism: Secondary | ICD-10-CM | POA: Diagnosis not present

## 2016-06-12 DIAGNOSIS — J9621 Acute and chronic respiratory failure with hypoxia: Secondary | ICD-10-CM | POA: Diagnosis not present

## 2016-06-12 DIAGNOSIS — R6521 Severe sepsis with septic shock: Secondary | ICD-10-CM | POA: Diagnosis not present

## 2016-06-12 LAB — CBC WITH DIFFERENTIAL/PLATELET
Basophils Absolute: 0 10*3/uL (ref 0.0–0.1)
Basophils Relative: 0 %
Eosinophils Absolute: 0.1 10*3/uL (ref 0.0–0.7)
Eosinophils Relative: 1 %
HEMATOCRIT: 29.7 % — AB (ref 39.0–52.0)
HEMOGLOBIN: 9.6 g/dL — AB (ref 13.0–17.0)
LYMPHS ABS: 1.8 10*3/uL (ref 0.7–4.0)
LYMPHS PCT: 12 %
MCH: 30.4 pg (ref 26.0–34.0)
MCHC: 32.3 g/dL (ref 30.0–36.0)
MCV: 94 fL (ref 78.0–100.0)
Monocytes Absolute: 1.4 10*3/uL — ABNORMAL HIGH (ref 0.1–1.0)
Monocytes Relative: 9 %
NEUTROS ABS: 12.2 10*3/uL — AB (ref 1.7–7.7)
NEUTROS PCT: 79 %
Platelets: 125 10*3/uL — ABNORMAL LOW (ref 150–400)
RBC: 3.16 MIL/uL — AB (ref 4.22–5.81)
RDW: 16.2 % — ABNORMAL HIGH (ref 11.5–15.5)
WBC: 15.5 10*3/uL — AB (ref 4.0–10.5)

## 2016-06-12 LAB — TYPE AND SCREEN
ABO/RH(D): A POS
ANTIBODY SCREEN: NEGATIVE

## 2016-06-12 LAB — BASIC METABOLIC PANEL
ANION GAP: 3 — AB (ref 5–15)
BUN: 26 mg/dL — ABNORMAL HIGH (ref 6–20)
CO2: 29 mmol/L (ref 22–32)
Calcium: 7.7 mg/dL — ABNORMAL LOW (ref 8.9–10.3)
Chloride: 106 mmol/L (ref 101–111)
Creatinine, Ser: 0.34 mg/dL — ABNORMAL LOW (ref 0.61–1.24)
GFR calc Af Amer: >60 mL/min (ref >60)
GLUCOSE: 104 mg/dL — AB (ref 65–99)
POTASSIUM: 4.7 mmol/L (ref 3.5–5.1)
Sodium: 138 mmol/L (ref 135–145)

## 2016-06-12 LAB — GLUCOSE, CAPILLARY
GLUCOSE-CAPILLARY: 93 mg/dL (ref 65–99)
GLUCOSE-CAPILLARY: 116 mg/dL — AB (ref 65–99)
GLUCOSE-CAPILLARY: 130 mg/dL — AB (ref 65–99)
GLUCOSE-CAPILLARY: 80 mg/dL (ref 65–99)
Glucose-Capillary: 98 mg/dL (ref 65–99)
Glucose-Capillary: 123 mg/dL — ABNORMAL HIGH (ref 65–99)

## 2016-06-12 LAB — HEMOGLOBIN AND HEMATOCRIT, BLOOD
HCT: 27.3 % — ABNORMAL LOW (ref 39.0–52.0)
Hemoglobin: 8.6 g/dL — ABNORMAL LOW (ref 13.0–17.0)

## 2016-06-12 LAB — PROTIME-INR
INR: 1.65
PROTHROMBIN TIME: 19.7 s — AB (ref 11.4–15.2)

## 2016-06-12 LAB — MAGNESIUM: Magnesium: 1.7 mg/dL (ref 1.7–2.4)

## 2016-06-12 MED ORDER — MORPHINE SULFATE (PF) 2 MG/ML IV SOLN
0.5000 mg | INTRAVENOUS | Status: DC | PRN
  Administered 2016-06-12 – 2016-06-13 (×2): 0.5 mg via INTRAVENOUS
  Filled 2016-06-12 (×3): qty 1

## 2016-06-12 NOTE — Progress Notes (Signed)
Subjective:  Bleeding around the tracheostomy  Antibiotics:  Anti-infectives    Start     Dose/Rate Route Frequency Ordered Stop   06/09/16 2200  sulfamethoxazole-trimethoprim (BACTRIM) 320 mg in dextrose 5 % 500 mL IVPB  Status:  Discontinued     320 mg 346.7 mL/hr over 90 Minutes Intravenous Every 8 hours 06/09/16 1950 06/10/16 1206   06/09/16 1900  aztreonam (AZACTAM) 2 g in dextrose 5 % 50 mL IVPB  Status:  Discontinued     2 g 100 mL/hr over 30 Minutes Intravenous Every 8 hours 06/09/16 1840 06/10/16 1206   05/30/16 1800  meropenem (MERREM) 1 g in sodium chloride 0.9 % 100 mL IVPB  Status:  Discontinued     1 g 200 mL/hr over 30 Minutes Intravenous Every 8 hours 05/30/16 1327 06/09/16 1950   05/30/16 1200  levofloxacin (LEVAQUIN) IVPB 750 mg  Status:  Discontinued     750 mg 100 mL/hr over 90 Minutes Intravenous Every 24 hours 05/29/16 1211 05/29/16 1705   05/30/16 0300  imipenem-cilastatin (PRIMAXIN) 500 mg in sodium chloride 0.9 % 100 mL IVPB  Status:  Discontinued     500 mg 200 mL/hr over 30 Minutes Intravenous Every 8 hours 05/29/16 1943 05/30/16 1327   05/29/16 2100  aztreonam (AZACTAM) 1 g in dextrose 5 % 50 mL IVPB  Status:  Discontinued     1 g 100 mL/hr over 30 Minutes Intravenous Every 8 hours 05/29/16 1211 05/29/16 1518   05/29/16 1800  imipenem-cilastatin (PRIMAXIN) 500 mg in sodium chloride 0.9 % 100 mL IVPB  Status:  Discontinued     500 mg 200 mL/hr over 30 Minutes Intravenous Every 6 hours 05/29/16 1721 05/29/16 1943   05/29/16 1400  linezolid (ZYVOX) IVPB 600 mg  Status:  Discontinued     600 mg 300 mL/hr over 60 Minutes Intravenous Every 12 hours 05/29/16 1320 06/04/16 1744   05/29/16 1045  levofloxacin (LEVAQUIN) IVPB 750 mg     750 mg 100 mL/hr over 90 Minutes Intravenous  Once 05/29/16 1031 05/29/16 1235   05/29/16 1045  aztreonam (AZACTAM) 2 g in dextrose 5 % 50 mL IVPB     2 g 100 mL/hr over 30 Minutes Intravenous  Once 05/29/16 1031  05/29/16 1330      Medications: Scheduled Meds: . amiodarone  100 mg Oral Daily  . chlorhexidine gluconate (MEDLINE KIT)  15 mL Mouth Rinse BID  . famotidine  20 mg Per Tube BID  . free water  200 mL Per Tube Q8H  . insulin aspart  0-9 Units Subcutaneous Q4H  . lacosamide  100 mg Oral BID  . levETIRAcetam  500 mg Per Tube BID  . mouth rinse  15 mL Mouth Rinse QID  . midodrine  10 mg Oral TID WC  . saccharomyces boulardii  250 mg Oral BID   Continuous Infusions: . sodium chloride 20 mL/hr at 06/08/16 0942  . feeding supplement (VITAL AF 1.2 CAL) 1,000 mL (06/12/16 0926)   PRN Meds:.guaiFENesin, hydroxypropyl methylcellulose / hypromellose, sodium chloride flush    Objective: Weight change:   Intake/Output Summary (Last 24 hours) at 06/12/16 1221 Last data filed at 06/12/16 0421  Gross per 24 hour  Intake              650 ml  Output              700 ml  Net              -  50 ml   Blood pressure (!) 118/98, pulse (!) 125, temperature 97.8 F (36.6 C), temperature source Axillary, resp. rate 13, height _0  (1.778 m), weight 167 lb (75.8 kg), SpO2 100 %. Temp:  [97.4 F (36.3 C)-98 F (36.7 C)] 97.8 F (36.6 C) (10/03 1213) Pulse Rate:  [88-125] 125 (10/03 1213) Resp:  [0-21] 13 (10/03 1142) BP: (98-118)/(61-98) 118/98 (10/03 1213) SpO2:  [99 %-100 %] 100 % (10/03 1142) FiO2 (%):  [30 %] 30 % (10/03 1142) Weight:  [167 lb (75.8 kg)] 167 lb (75.8 kg) (10/03 0432)  Physical Exam: HEENT: anicteric sclera,  EOMI, nodding shaking head to questions Tracheostomy with some blood around it Cardiovascular:tachy rate, normal r,  no murmur rubs or gallops Pulmonary: c rhonchi Gastrointestinal: soft nontender, nondistended, normal bowel sounds, Musculoskeletal contractures, bandages Skin,no rashes Neuro: no new focal deficits  CBC:  CBC Latest Ref Rng & Units 06/12/2016 06/11/2016 06/10/2016  WBC 4.0 - 10.5 K/uL 15.5(H) 13.7(H) 17.4(H)  Hemoglobin 13.0 - 17.0 g/dL 9.6(L)  10.0(L) 10.2(L)  Hematocrit 39.0 - 52.0 % 29.7(L) 31.0(L) 31.8(L)  Platelets 150 - 400 K/uL 125(L) 108(L) 88(L)     BMET  Recent Labs  06/11/16 0457 06/12/16 0506  NA 141 138  K 4.5 4.7  CL 108 106  CO2 29 29  GLUCOSE 107* 104*  BUN 26* 26*  CREATININE 0.38* 0.34*  CALCIUM 7.9* 7.7*     Liver Panel  No results for input(s): PROT, ALBUMIN, AST, ALT, ALKPHOS, BILITOT, BILIDIR, IBILI in the last 72 hours.     Sedimentation Rate No results for input(s): ESRSEDRATE in the last 72 hours. C-Reactive Protein No results for input(s): CRP in the last 72 hours.  Micro Results: Recent Results (from the past 720 hour(s))  Blood Culture (routine x 2)     Status: None   Collection Time: 05/29/16 10:15 AM  Result Value Ref Range Status   Specimen Description BLOOD RIGHT ANTECUBITAL  Final   Special Requests BOTTLES DRAWN AEROBIC AND ANAEROBIC 5CC  Final   Culture NO GROWTH 5 DAYS  Final   Report Status 06/03/2016 FINAL  Final  Blood Culture (routine x 2)     Status: None   Collection Time: 05/29/16 10:25 AM  Result Value Ref Range Status   Specimen Description BLOOD RIGHT HAND  Final   Special Requests BOTTLES DRAWN AEROBIC AND ANAEROBIC 5CC  Final   Culture NO GROWTH 5 DAYS  Final   Report Status 06/03/2016 FINAL  Final  Urine culture     Status: Abnormal   Collection Time: 05/29/16 12:15 PM  Result Value Ref Range Status   Specimen Description URINE, RANDOM  Final   Special Requests NONE  Final   Culture MULTIPLE SPECIES PRESENT, SUGGEST RECOLLECTION (A)  Final   Report Status 05/30/2016 FINAL  Final  MRSA PCR Screening     Status: None   Collection Time: 05/30/16  2:52 AM  Result Value Ref Range Status   MRSA by PCR NEGATIVE NEGATIVE Final    Comment:        The GeneXpert MRSA Assay (FDA approved for NASAL specimens only), is one component of a comprehensive MRSA colonization surveillance program. It is not intended to diagnose MRSA infection nor to guide  or monitor treatment for MRSA infections.   Culture, Urine     Status: None   Collection Time: 05/31/16  3:20 PM  Result Value Ref Range Status   Specimen Description URINE, CATHETERIZED  Final  Special Requests NONE  Final   Culture NO GROWTH  Final   Report Status 06/01/2016 FINAL  Final  Culture, blood (routine x 2)     Status: None   Collection Time: 06/04/16  5:42 PM  Result Value Ref Range Status   Specimen Description BLOOD LEFT HAND  Final   Special Requests IN PEDIATRIC BOTTLE  3CC  Final   Culture NO GROWTH 5 DAYS  Final   Report Status 06/09/2016 FINAL  Final  Culture, blood (routine x 2)     Status: None   Collection Time: 06/04/16  5:50 PM  Result Value Ref Range Status   Specimen Description BLOOD LEFT HAND  Final   Special Requests BOTTLES DRAWN AEROBIC ONLY  5CC  Final   Culture NO GROWTH 5 DAYS  Final   Report Status 06/09/2016 FINAL  Final  C difficile quick scan w PCR reflex     Status: None   Collection Time: 06/04/16  6:30 PM  Result Value Ref Range Status   C Diff antigen NEGATIVE NEGATIVE Final   C Diff toxin NEGATIVE NEGATIVE Final   C Diff interpretation No C. difficile detected.  Final  Urine culture     Status: None   Collection Time: 06/04/16  8:33 PM  Result Value Ref Range Status   Specimen Description URINE, CATHETERIZED  Final   Special Requests NONE  Final   Culture NO GROWTH  Final   Report Status 06/06/2016 FINAL  Final  Culture, respiratory (NON-Expectorated)     Status: None (Preliminary result)   Collection Time: 06/04/16 11:28 PM  Result Value Ref Range Status   Specimen Description TRACHEAL ASPIRATE  Final   Special Requests NONE  Final   Gram Stain   Final    RARE SQUAMOUS EPITHELIAL CELLS PRESENT FEW WBC PRESENT,BOTH PMN AND MONONUCLEAR MODERATE GRAM NEGATIVE RODS    Culture   Final    ABUNDANT STENOTROPHOMONAS MALTOPHILIA ABUNDANT PSEUDOMONAS AERUGINOSA ABUNDANT ACINETOBACTER CALCOACETICUS/BAUMANNII COMPLEX Results Called  to: J. LINDSAY RN, AT [phone] 06/09/16 BY D. VANHOOK MULTI-DRUG RESISTANT ORGANISM Sent to [facility] for further susceptibility testing.    Report Status PENDING  Incomplete   Organism ID, Bacteria PSEUDOMONAS AERUGINOSA  Final   Organism ID, Bacteria STENOTROPHOMONAS MALTOPHILIA  Final   Organism ID, Bacteria ACINETOBACTER CALCOACETICUS/BAUMANNII COMPLEX  Final      Susceptibility   Acinetobacter calcoaceticus/baumannii complex - MIC*    CEFTAZIDIME >=64 RESISTANT Resistant     CEFTRIAXONE >=64 RESISTANT Resistant     CIPROFLOXACIN >=4 RESISTANT Resistant     GENTAMICIN 8 INTERMEDIATE Intermediate     IMIPENEM >=16 RESISTANT Resistant     PIP/TAZO >=128 RESISTANT Resistant     TRIMETH/SULFA 80 RESISTANT Resistant     CEFEPIME >=64 RESISTANT Resistant     AMPICILLIN/SULBACTAM >=32 RESISTANT Resistant     * ABUNDANT ACINETOBACTER CALCOACETICUS/BAUMANNII COMPLEX   Pseudomonas aeruginosa - MIC*    CEFTAZIDIME 4 SENSITIVE Sensitive     CIPROFLOXACIN >=4 RESISTANT Resistant     GENTAMICIN >=16 RESISTANT Resistant     IMIPENEM >=16 RESISTANT Resistant     CEFEPIME 16 INTERMEDIATE Intermediate     * ABUNDANT PSEUDOMONAS AERUGINOSA   Stenotrophomonas maltophilia - MIC*    LEVOFLOXACIN 4 INTERMEDIATE Intermediate     * ABUNDANT STENOTROPHOMONAS MALTOPHILIA    Studies/Results: Dg Chest Port 1 View  Result Date: 06/12/2016 CLINICAL DATA:  Bleeding around tracheostomy. EXAM: PORTABLE CHEST 1 VIEW COMPARISON:  06/10/2016 FINDINGS: Tracheostomy appears unchanged and unremarkable. Pleural  effusions have enlarged bilaterally with volume loss in both lungs. Airspace barium on the right is unchanged. IMPRESSION: Enlarging effusions bilaterally with worsened volume loss in both lungs. No specific abnormal finding related to the tracheostomy itself. Electronically Signed   By: Nelson Chimes M.D.   On: 06/12/2016 12:01      Assessment/Plan:  INTERVAL HISTORY:   Patient has been AFEBRILE off  antibiotics   Principal Problem:   Septic shock ([place]) Active Problems:   HCAP (healthcare-associated pneumonia)   Pressure injury of skin   Quadriplegia (HCC)   Atrial fibrillation (HCC)   Diabetes mellitus without complication ([place])   Chronic respiratory failure (HCC)   Pressure ulcer stage IV   Encounter for wound care   Protein-calorie malnutrition, severe ([place])   Palliative care encounter   Goals of care, counseling/discussion   Chronic post traumatic encephalopathy   Acute encephalopathy   Hypokalemia   Hypomagnesemia   Urinary tract infection, site not specified   Acute on chronic respiratory failure with hypoxia (HCC)   Dysphagia   Decubitus ulcer of sacral area   Cough with frothy sputum   VAP (ventilator-associated pneumonia) ([place])   Carbapenem-resistant Acinetobacter baumannii infection   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 He was treated with broad-spectrum antibiotics and improved clinically though his white count went up which point in time tracheal aspirate was sent for cultures that essentially given [place]  multiple multidrug resistant organisms.  #1 Ventilator associated pneumonia:  Given the clinical picture I have decided that these MDRO are CONTAMINANTS at this point and stopped his abx since he had full course of antimicrobials for VAP  He remains afebrile off these abx  We are trying to find susceptibilities of the Acinetobacter to colistin, amikacin, tygacil zerbexa and the Stenotrophomonas to Bactrim (shoudl have been reported)  SHOULD these organisms later become pathogens  BEFORE deciding that they are  pathogens pts clinical picture would need to change and I would want CCM to perform bronchoscopy for deep cultures to guide therapy  I will sign off for now    LOS: 14 days   Alcide Evener 06/12/2016, 12:21 PM

## 2016-06-12 NOTE — Progress Notes (Signed)
Noted dark red blood coming out from the mouth and trach site, family and palliative RN at [facility]. HR at 110's BP 116/88,sat's 100% , PCCM was paged.

## 2016-06-12 NOTE — Consult Note (Signed)
Reason for Consult:Bleeding from tracheostomy Referring Physician: Elmarie Shiley, MD  Dean Graham is an 55 y.o. male.  HPI: History of long term tracheostomy, placed over 3 years ago in another town. Has had some bleeding from around the trach and from the trach on and off today.Has a #6 Shiley cuffed trach. On ventilator for Pneumonia. Was on coumadin but it was held this morning.  Past Medical History:  Diagnosis Date  . Atrial fibrillation ([place])   . Chronic respiratory failure ([place])   . Diabetes mellitus without complication ([place])   . Diastolic heart failure ([place])   . Dysphagia   . GERD (gastroesophageal reflux disease)   . Quadriplegia ([place])     No past surgical history on file.  No family history on file.  Social History:  has no tobacco, alcohol, and drug history on file.  Allergies:  Allergies  Allergen Reactions  . Ativan [Lorazepam]   . Azithromycin Other (See Comments)    Blisters all over body  . Ceftriaxone Other (See Comments)    Blisters all over body  . Vancomycin Other (See Comments)    Blisters all over body    Medications: Reviewed  Results for orders placed or performed during the hospital encounter of 05/29/16 (from the past 48 hour(s))  Glucose, capillary     Status: None   Collection Time: 06/10/16 11:07 PM  Result Value Ref Range   Glucose-Capillary 94 65 - 99 mg/dL   Comment 1 Notify RN   Glucose, capillary     Status: Abnormal   Collection Time: 06/11/16  2:52 AM  Result Value Ref Range   Glucose-Capillary 116 (H) 65 - 99 mg/dL   Comment 1 Notify RN   CBC with Differential/Platelet     Status: Abnormal   Collection Time: 06/11/16  4:57 AM  Result Value Ref Range   WBC 13.7 (H) 4.0 - 10.5 K/uL   RBC 3.30 (L) 4.22 - 5.81 MIL/uL   Hemoglobin 10.0 (L) 13.0 - 17.0 g/dL   HCT 31.0 (L) 39.0 - 52.0 %   MCV 93.9 78.0 - 100.0 fL   MCH 30.3 26.0 - 34.0 pg   MCHC 32.3 30.0 - 36.0 g/dL   RDW 16.1 (H) 11.5 - 15.5 %   Platelets 108 (L)  150 - 400 K/uL   Neutrophils Relative % 75 %   Lymphocytes Relative 15 %   Monocytes Relative 9 %   Eosinophils Relative 1 %   Basophils Relative 0 %   Neutro Abs 10.3 (H) 1.7 - 7.7 K/uL   Lymphs Abs 2.1 0.7 - 4.0 K/uL   Monocytes Absolute 1.2 (H) 0.1 - 1.0 K/uL   Eosinophils Absolute 0.1 0.0 - 0.7 K/uL   Basophils Absolute 0.0 0.0 - 0.1 K/uL   RBC Morphology POLYCHROMASIA PRESENT     Comment: TARGET CELLS   WBC Morphology MILD LEFT SHIFT (1-5% METAS, OCC MYELO, OCC BANDS)     Comment: VACUOLATED NEUTROPHILS  Magnesium     Status: None   Collection Time: 06/11/16  4:57 AM  Result Value Ref Range   Magnesium 1.9 1.7 - 2.4 mg/dL  Basic metabolic panel     Status: Abnormal   Collection Time: 06/11/16  4:57 AM  Result Value Ref Range   Sodium 141 135 - 145 mmol/L   Potassium 4.5 3.5 - 5.1 mmol/L    Comment: SLIGHT HEMOLYSIS   Chloride 108 101 - 111 mmol/L   CO2 29 22 - 32 mmol/L  Glucose, Bld 107 (H) 65 - 99 mg/dL   BUN 26 (H) 6 - 20 mg/dL   Creatinine, Ser 0.38 (L) 0.61 - 1.24 mg/dL   Calcium 7.9 (L) 8.9 - 10.3 mg/dL   GFR calc non Af Amer >60 >60 mL/min   GFR calc Af Amer >60 >60 mL/min    Comment: (NOTE) The eGFR has been calculated using the CKD EPI equation. This calculation has not been validated in all clinical situations. eGFR's persistently <60 mL/min signify possible Chronic Kidney Disease.    Anion gap 4 (L) 5 - 15  Glucose, capillary     Status: Abnormal   Collection Time: 06/11/16  7:55 AM  Result Value Ref Range   Glucose-Capillary 104 (H) 65 - 99 mg/dL  Glucose, capillary     Status: Abnormal   Collection Time: 06/11/16 12:07 PM  Result Value Ref Range   Glucose-Capillary 109 (H) 65 - 99 mg/dL  Glucose, capillary     Status: Abnormal   Collection Time: 06/11/16  4:20 PM  Result Value Ref Range   Glucose-Capillary 100 (H) 65 - 99 mg/dL  Glucose, capillary     Status: Abnormal   Collection Time: 06/11/16  8:32 PM  Result Value Ref Range    Glucose-Capillary 105 (H) 65 - 99 mg/dL   Comment 1 Notify RN   Glucose, capillary     Status: Abnormal   Collection Time: 06/11/16 11:55 PM  Result Value Ref Range   Glucose-Capillary 108 (H) 65 - 99 mg/dL   Comment 1 Notify RN   Glucose, capillary     Status: None   Collection Time: 06/12/16  4:19 AM  Result Value Ref Range   Glucose-Capillary 98 65 - 99 mg/dL   Comment 1 Notify RN   CBC with Differential/Platelet     Status: Abnormal   Collection Time: 06/12/16  5:06 AM  Result Value Ref Range   WBC 15.5 (H) 4.0 - 10.5 K/uL   RBC 3.16 (L) 4.22 - 5.81 MIL/uL   Hemoglobin 9.6 (L) 13.0 - 17.0 g/dL   HCT 29.7 (L) 39.0 - 52.0 %   MCV 94.0 78.0 - 100.0 fL   MCH 30.4 26.0 - 34.0 pg   MCHC 32.3 30.0 - 36.0 g/dL   RDW 16.2 (H) 11.5 - 15.5 %   Platelets 125 (L) 150 - 400 K/uL   Neutrophils Relative % 79 %   Neutro Abs 12.2 (H) 1.7 - 7.7 K/uL   Lymphocytes Relative 12 %   Lymphs Abs 1.8 0.7 - 4.0 K/uL   Monocytes Relative 9 %   Monocytes Absolute 1.4 (H) 0.1 - 1.0 K/uL   Eosinophils Relative 1 %   Eosinophils Absolute 0.1 0.0 - 0.7 K/uL   Basophils Relative 0 %   Basophils Absolute 0.0 0.0 - 0.1 K/uL  Magnesium     Status: None   Collection Time: 06/12/16  5:06 AM  Result Value Ref Range   Magnesium 1.7 1.7 - 2.4 mg/dL  Basic metabolic panel     Status: Abnormal   Collection Time: 06/12/16  5:06 AM  Result Value Ref Range   Sodium 138 135 - 145 mmol/L   Potassium 4.7 3.5 - 5.1 mmol/L   Chloride 106 101 - 111 mmol/L   CO2 29 22 - 32 mmol/L   Glucose, Bld 104 (H) 65 - 99 mg/dL   BUN 26 (H) 6 - 20 mg/dL   Creatinine, Ser 0.34 (L) 0.61 - 1.24 mg/dL   Calcium 7.7 (  L) 8.9 - 10.3 mg/dL   GFR calc non Af Amer >60 >60 mL/min   GFR calc Af Amer >60 >60 mL/min    Comment: (NOTE) The eGFR has been calculated using the CKD EPI equation. This calculation has not been validated in all clinical situations. eGFR's persistently <60 mL/min signify possible Chronic Kidney Disease.     Anion gap 3 (L) 5 - 15  Glucose, capillary     Status: None   Collection Time: 06/12/16  7:38 AM  Result Value Ref Range   Glucose-Capillary 93 65 - 99 mg/dL  Glucose, capillary     Status: Abnormal   Collection Time: 06/12/16 12:18 PM  Result Value Ref Range   Glucose-Capillary 116 (H) 65 - 99 mg/dL  Glucose, capillary     Status: Abnormal   Collection Time: 06/12/16  5:59 PM  Result Value Ref Range   Glucose-Capillary 130 (H) 65 - 99 mg/dL  Protime-INR     Status: Abnormal   Collection Time: 06/12/16  6:31 PM  Result Value Ref Range   Prothrombin Time 19.7 (H) 11.4 - 15.2 seconds   INR 1.65   Hemoglobin and hematocrit, blood     Status: Abnormal   Collection Time: 06/12/16  6:42 PM  Result Value Ref Range   Hemoglobin 8.6 (L) 13.0 - 17.0 g/dL   HCT 27.3 (L) 39.0 - 52.0 %  Type and screen [place]     Status: None   Collection Time: 06/12/16  7:10 PM  Result Value Ref Range   ABO/RH(D) A POS    Antibody Screen NEG    Sample Expiration 06/15/2016   Glucose, capillary     Status: None   Collection Time: 06/12/16  8:49 PM  Result Value Ref Range   Glucose-Capillary 80 65 - 99 mg/dL   Comment 1 Notify RN     Dg Chest Port 1 View  Result Date: 06/12/2016 CLINICAL DATA:  Bleeding around tracheostomy. EXAM: PORTABLE CHEST 1 VIEW COMPARISON:  06/10/2016 FINDINGS: Tracheostomy appears unchanged and unremarkable. Pleural effusions have enlarged bilaterally with volume loss in both lungs. Airspace barium on the right is unchanged. IMPRESSION: Enlarging effusions bilaterally with worsened volume loss in both lungs. No specific abnormal finding related to the tracheostomy itself. Electronically Signed   By: Nelson Chimes M.D.   On: 06/12/2016 12:01    [id number] except as listed in admit H&P  Blood pressure 99/77, pulse (!) 114, temperature 98 F (36.7 C), temperature source Axillary, resp. rate 17, height _0  (1.778 m), weight 75.8 kg (167 lb), SpO2 100  %.  PHYSICAL EXAM:  Tracheostomy in place with some dried blood around the base of the shield in a dressing. Fiberoptic flexible scope was passed through the trach and into the trachea. There was no blood deep to the trach tube, but there did appear to be some tracheal collapse. The trach was pulled out a couple centimeters and there was still no signs of blood or mucosal ulceration. The trach was then removed and replaced with a Bivona adjustable length and the length was adjusted to a level at which there was even breath sounds and good volumes. Saturations remained at 90-100%. There was some old blood around the trach site adjacent to the skin. A 4x4 gauze was packed under the shield and into the trach site.  Studies Reviewed: none  Procedures: See above   Assessment/Plan: Bleeding does not seem to be from in the trachea, but instead is from  the tracheostomy tract/wound. This could be from chronic irritation from the tube. I have changed to a new Bivona adjustable length tube and he seems to be tolerating this tube well. We will continue to monitor for bleeding.   Delaina Fetsch 06/12/2016, 9:12 PM

## 2016-06-12 NOTE — Progress Notes (Signed)
RT went into patients room due to ventilator alarm going off and once again found patient with blood clots coming out of the bottom of his trach. RN, attending MD, and CCM have all been notified. RT will continue to monitor.

## 2016-06-12 NOTE — Progress Notes (Signed)
RT came into room for 12:00 ventilator check to find patient with a huge blood clot coming out of the bottom of his trach. MD was at bedside with RT at the time. RT suggested chest xray. RT proceeded to suction patient and obtained a moderate amount of white clear secretions. RT will continue to monitor.

## 2016-06-12 NOTE — Progress Notes (Signed)
Noted  big blood clots around the trach,MD and RT at bedside , already suctioned no blood noted coming out from inner cannula , pt alert, responsive but diaphoretic with BP of 91/71 .C-Xray done, already ordered CBC, will continue to monitorl.

## 2016-06-12 NOTE — Progress Notes (Addendum)
PROGRESS NOTE    Dean Graham  JME:[id number] DOB: 10-27-1960 DOA: 05/29/2016 PCP: Verneita Griffes, MD   Brief Narrative:  55 y.o.BM SNF resident PMHx TBI, Quadriplegia , Chronic Trach-dependent respiratory failure, PEG tube, Neurogenic bladder with Chronic foley, Atrial fibrillation , Diastolic CHF, Anemia of chronic disease,Diabetes mellitus Type 2 without complication  Who was sent from [place] 9/19 with fever, hypotension and increased work of breathing.  In ED, pt was febrile with T max of 100.7 F, hypotensive with BP of 72/60 without improvement after 4L IV fluids. He is white blood cell count was 23. Chest x-ray showed bilateral pleural effusions with pneumonia versus atelectasis. Patient was admitted to ICU for possible need for pressor support. Patient was transferred to stepdown unit 05/30/2016. Off antibiotics. Grew pseudomonas from trach aspirate pan resistant, ID think this is colonization.  Trying to find sensitivity to Acinetobacter to colistin, amikacin, tygacil zerbexa and the Stenotrophomonas to Bactrim (shoudl have been reported)  In case  these organisms later become pathogens. See ID note.     Subjective: On vent, moves head to answer yes and no questions. He started to have bleeding around  trach today    Assessment & Plan:   Principal Problem:   Septic shock ([place]) Active Problems:   HCAP (healthcare-associated pneumonia)   Pressure injury of skin   Quadriplegia (HCC)   Atrial fibrillation (HCC)   Diabetes mellitus without complication ([place])   Chronic respiratory failure (HCC)   Pressure ulcer stage IV   Encounter for wound care   Protein-calorie malnutrition, severe ([place])   Palliative care encounter   Goals of care, counseling/discussion   Chronic post traumatic encephalopathy   Acute encephalopathy   Hypokalemia   Hypomagnesemia   Urinary tract infection, site not specified   Acute on chronic respiratory failure with hypoxia (HCC)   Dysphagia  Decubitus ulcer of sacral area   Cough with frothy sputum   VAP (ventilator-associated pneumonia) (HCC)   Carbapenem-resistant Acinetobacter baumannii infection   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 -Patient remains tachycardic and hypotensive with increasing white count. Continue antibiotics per ID -Chest x-ray on admission showed bilateral pleural effusions with pneumonia vs atelectasis -has recived  Albumin 50 g 2 - Continue midodrine 10 mg TID -C diff negative. Respiratory culture: Acinetobacter/Baumannin, Pseudomonas.  -. Blood culture 9-25 no growth to date.  --[place] negative for abscess.  --culture growing Pseudomonas, pan-reistant, only sensitive to ceftazidime. Patient with reported cephalosporin allergy, will request record from prior hospital, care everywhere reviewed, no information regarding cephalosporin allergy.  - meropenem discontinue.  -wbc fluctuates.  -ID helping with Antibiotics. Patient off antibiotics at this time. Received treatment for PNA.  -ID think Pseudomonas could be contaminant. We are trying to get susceptibilities of the Acinetobacter and the Strenotrophonam in case this become pathogens. If clinical picture change ID recommend CCM consult to perform Bronch for further deep culture.  Addendum; Patient bleeding from trach, CCM consulted early today.  Patient continue to bleed. Dr Constance Holster consulted.  Check Hb and INR. \ Vitals stable.   Acute on Chronic respiratory failure with hypoxia, trach-dependent - on vent  Mucinex.  -IV lasix PRN -blood coming around trach. CCM consulted for evaluation.  -repeat hb. Hold DVT prophylaxis for now.  -repeated chest x ray.   Diarrhea;  Still with diarrhea.  C diff negative 9-25 Added  florastore.   Anemia  of chronic disease - Baseline hemoglobin around 8 - S/P 2 unit PRBC transfusion on 9-27 -hb stable at 10  Mild troponin elevation - Due to demand ischemic from septic shock - Troponin 0.09 --> 0.07  - No further work up needed at this time  -ECHO no wall motion abnormalities.   Acute encephalopathy/Chronic Encephalopathy secondary to TBI as a child - Due to septic shock - able to move head to answer yes and no.   Hypernatremia; resolved, stop IV fluids. Free water added.  Follow labs daily.   Hyperkalemia / Hypokalemia  - Repeat labs this afternoon.  Hemolysis notice on labs.   Hypomagnesemia -Magnesium goal> 2  Thrombocytopenia; related to infection.   Chronic diastolic CHF -Echocardiogram normal Ef  - one time dose of lasix today.   Chronic atrial fibrillation(CHADS vasc score 2) - On amiodarone and aspirin   Dysphagia, PEG tube-dependent / Severe protein calorie malnutrition  - Chronic, stable  - Continue tube feeds   Quadriplegia - Stable   Neurogenic bladder - Chronic, stable - Has Foley catheter in place  Seizure disorder - Continue keppra, vimpat  Stage 4 sacral pressure ulcer, stage 3 right and left hip sacral ulcer - Appreciate wound care assessment - Stage 4Sacrum 10cm x 8cm x 0.2cm with 100% red gran tissue, mod amtdrainage green, mild odor - Healing stage 3 righthip 2.5cm x 2 cm 100 % red granulating, no drainage - Left hip Stage 3 ulcer 4.5cm x 7cm x 1cm, 100% red granulating, modamt green drainage, mild odor  -Left great toe full thickness 0.8cm x 0.8cm 100% red gran tissue, no drainage or odor - L flank MARSI 0.5cm x 0.3cm pink and dry - L knee full thickness 2.5cm x 2cm red and moistno drainage or odor noted - L heel Stage 2 0.3cm x 0.3cm pink and moist no drainage or odor - Per WOC, apply quacell for sacrum and left hip to absorb drainage and provide antimicrobial benifitsand allevyn foam to the others -Placed on a mattress  and ensure on rotation at all times   Goals of care Palliative care consulted.      DVT prophylaxis: Subcutaneous heparin  Code Status: DO NOT RESUSCITATE Family Communication:   Palliative following. See my prior notes regarding discussion with family   Disposition Plan: Await further palliative care recommendation   Consultants:  [name] ID Dr.Daniel Daneil Dan [facility] M    Procedures/Significant Events:  None   Cultures 9/19 blood right AC/hand negative 9/19 urine positive multiple species 9/20 MRSA by PCR negative 9/21 urine negative 9/25 blood left hand 2 NGTD 9/25 urine pending 9/25 tracheal aspirate positive GNR    Antimicrobials: Meropenem 9/20 >> Linezolid 9/19 >> 9/25 Aztreonam 9/19 >> 9/19 Levaquin 9/19 >>9/19   Devices None   LINES / TUBES:  Right midline PICC 9/19 >>    Continuous Infusions: . sodium chloride 20 mL/hr at 06/08/16 0942  . feeding supplement (VITAL AF 1.2 CAL) 1,000 mL (06/12/16 0926)     Objective: Vitals:   06/12/16 0432 06/12/16 0500 06/12/16 0727 06/12/16 0741  BP:  105/84 (!) 111/91 110/81  Pulse:  96 96   Resp:  10 15   Temp:    97.8 F (36.6 C)  TempSrc:    Axillary  SpO2:  100% 100%   Weight: 75.8 kg (167 lb)     Height:        Intake/Output  Summary (Last 24 hours) at 06/12/16 1138 Last data filed at 06/12/16 0421  Gross per 24 hour  Intake              740 ml  Output              700 ml  Net               40 ml   Filed Weights   06/10/16 0422 06/12/16 0000 06/12/16 0432  Weight: 71.7 kg (158 lb) 75.8 kg (167 lb) 75.8 kg (167 lb)    Examination:  General: A/O 0, positive chronic respiratory distress (on vent) Eyes: negative scleral hemorrhage, negative anisocoria, negative icterus, right eye swollen ENT: Negative Runny nose, negative gingival bleeding, Neck:  Negative scars, masses, torticollis, lymphadenopathy, JVD, trach  Lungs: coarse breath sounds diffusely, negative wheezes  or crackles Cardiovascular: Regular rate and rhythm without murmur gallop or rub normal S1 and S2 Abdomen: negative abdominal pain, nondistended, positive soft, bowel sounds, no rebound, no ascites, no appreciable mass, PEG site covered and clean negative sign of infection Extremities: No significant cyanosis, clubbing. Plus bilateral lower extremity edema 2+ Skin: Multiple hip/sacral ulcers stage 3/4 (did not review today).  Psychiatric:  Unable to evaluate  Central nervous system:  Moves head to answer yes and no questions.   .     CBC:  Recent Labs Lab 06/08/16 0450 06/09/16 0411 06/10/16 0400 06/11/16 0457 06/12/16 0506  WBC 18.7* 16.3* 17.4* 13.7* 15.5*  NEUTROABS 15.3* 12.8* 12.6* 10.3* 12.2*  HGB 10.8* 10.4* 10.2* 10.0* 9.6*  HCT 33.0* 32.0* 31.8* 31.0* 29.7*  MCV 94.0 94.7 95.2 93.9 94.0  PLT 105* 99* 88* 108* 601*   Basic Metabolic Panel:  Recent Labs Lab 06/08/16 0450 06/09/16 0411 06/09/16 1410 06/10/16 0400 06/11/16 0457 06/12/16 0506  NA 143 142 140 144 141 138  K 4.4 5.6* 4.0 4.1 4.5 4.7  CL 114* 112* 111 112* 108 106  CO2 '26 27 27 29 29 29  '$ GLUCOSE 88 106* 105* 99 107* 104*  BUN 32* 32* 28* 28* 26* 26*  CREATININE 0.40* 0.39* 0.34* 0.35* 0.38* 0.34*  CALCIUM 8.4* 8.0* 7.8* 8.0* 7.9* 7.7*  MG 2.1 2.1  --  1.7 1.9 1.7   GFR: Estimated Creatinine Clearance: 105.2 mL/min (by C-G formula based on SCr of 0.34 mg/dL (L)). Liver Function Tests:  Recent Labs Lab 06/06/16 0630  AST 74*  ALT 48  ALKPHOS 122  BILITOT 0.8  PROT 4.9*  ALBUMIN 2.3*   No results for input(s): LIPASE, AMYLASE in the last 168 hours. No results for input(s): AMMONIA in the last 168 hours. Coagulation Profile: No results for input(s): INR, PROTIME in the last 168 hours. Cardiac Enzymes: No results for input(s): CKTOTAL, CKMB, CKMBINDEX, TROPONINI in the last 168 hours. BNP (last 3 results) No results for input(s): PROBNP in the last 8760 hours. HbA1C: No results for  input(s): HGBA1C in the last 72 hours. CBG:  Recent Labs Lab 06/11/16 1620 06/11/16 2032 06/11/16 2355 06/12/16 0419 06/12/16 0738  GLUCAP 100* 105* 108* 98 93   Lipid Profile: No results for input(s): CHOL, HDL, LDLCALC, TRIG, CHOLHDL, LDLDIRECT in the last 72 hours. Thyroid Function Tests: No results for input(s): TSH, T4TOTAL, FREET4, T3FREE, THYROIDAB in the last 72 hours. Anemia Panel: No results for input(s): VITAMINB12, FOLATE, FERRITIN, TIBC, IRON, RETICCTPCT in the last 72 hours. Urine analysis:    Component Value Date/Time   COLORURINE YELLOW 06/04/2016 2034   APPEARANCEUR  CLEAR 06/04/2016 2034   LABSPEC 1.027 06/04/2016 2034   PHURINE 6.0 06/04/2016 2034   GLUCOSEU NEGATIVE 06/04/2016 2034   HGBUR NEGATIVE 06/04/2016 2034   BILIRUBINUR NEGATIVE 06/04/2016 2034   KETONESUR NEGATIVE 06/04/2016 2034   PROTEINUR NEGATIVE 06/04/2016 2034   NITRITE NEGATIVE 06/04/2016 2034   LEUKOCYTESUR NEGATIVE 06/04/2016 2034   Sepsis Labs: '@LABRCNTIP'$ (procalcitonin:4,lacticidven:4)  ) Recent Results (from the past 240 hour(s))  Culture, blood (routine x 2)     Status: None   Collection Time: 06/04/16  5:42 PM  Result Value Ref Range Status   Specimen Description BLOOD LEFT HAND  Final   Special Requests IN PEDIATRIC BOTTLE  3CC  Final   Culture NO GROWTH 5 DAYS  Final   Report Status 06/09/2016 FINAL  Final  Culture, blood (routine x 2)     Status: None   Collection Time: 06/04/16  5:50 PM  Result Value Ref Range Status   Specimen Description BLOOD LEFT HAND  Final   Special Requests BOTTLES DRAWN AEROBIC ONLY  5CC  Final   Culture NO GROWTH 5 DAYS  Final   Report Status 06/09/2016 FINAL  Final  C difficile quick scan w PCR reflex     Status: None   Collection Time: 06/04/16  6:30 PM  Result Value Ref Range Status   C Diff antigen NEGATIVE NEGATIVE Final   C Diff toxin NEGATIVE NEGATIVE Final   C Diff interpretation No C. difficile detected.  Final  Urine culture      Status: None   Collection Time: 06/04/16  8:33 PM  Result Value Ref Range Status   Specimen Description URINE, CATHETERIZED  Final   Special Requests NONE  Final   Culture NO GROWTH  Final   Report Status 06/06/2016 FINAL  Final  Culture, respiratory (NON-Expectorated)     Status: None (Preliminary result)   Collection Time: 06/04/16 11:28 PM  Result Value Ref Range Status   Specimen Description TRACHEAL ASPIRATE  Final   Special Requests NONE  Final   Gram Stain   Final    RARE SQUAMOUS EPITHELIAL CELLS PRESENT FEW WBC PRESENT,BOTH PMN AND MONONUCLEAR MODERATE GRAM NEGATIVE RODS    Culture   Final    ABUNDANT STENOTROPHOMONAS MALTOPHILIA ABUNDANT PSEUDOMONAS AERUGINOSA ABUNDANT ACINETOBACTER CALCOACETICUS/BAUMANNII COMPLEX Results Called to: J. LINDSAY RN, AT [phone] 06/09/16 BY D. VANHOOK MULTI-DRUG RESISTANT ORGANISM Sent to [place] for further susceptibility testing.    Report Status PENDING  Incomplete   Organism ID, Bacteria PSEUDOMONAS AERUGINOSA  Final   Organism ID, Bacteria STENOTROPHOMONAS MALTOPHILIA  Final   Organism ID, Bacteria ACINETOBACTER CALCOACETICUS/BAUMANNII COMPLEX  Final      Susceptibility   Acinetobacter calcoaceticus/baumannii complex - MIC*    CEFTAZIDIME >=64 RESISTANT Resistant     CEFTRIAXONE >=64 RESISTANT Resistant     CIPROFLOXACIN >=4 RESISTANT Resistant     GENTAMICIN 8 INTERMEDIATE Intermediate     IMIPENEM >=16 RESISTANT Resistant     PIP/TAZO >=128 RESISTANT Resistant     TRIMETH/SULFA 80 RESISTANT Resistant     CEFEPIME >=64 RESISTANT Resistant     AMPICILLIN/SULBACTAM >=32 RESISTANT Resistant     * ABUNDANT ACINETOBACTER CALCOACETICUS/BAUMANNII COMPLEX   Pseudomonas aeruginosa - MIC*    CEFTAZIDIME 4 SENSITIVE Sensitive     CIPROFLOXACIN >=4 RESISTANT Resistant     GENTAMICIN >=16 RESISTANT Resistant     IMIPENEM >=16 RESISTANT Resistant     CEFEPIME 16 INTERMEDIATE Intermediate     * ABUNDANT PSEUDOMONAS AERUGINOSA    Stenotrophomonas  maltophilia - MIC*    LEVOFLOXACIN 4 INTERMEDIATE Intermediate     * ABUNDANT STENOTROPHOMONAS MALTOPHILIA         Radiology Studies: No results found.      Scheduled Meds: . amiodarone  100 mg Oral Daily  . aspirin  81 mg Per Tube Daily  . chlorhexidine gluconate (MEDLINE KIT)  15 mL Mouth Rinse BID  . famotidine  20 mg Per Tube BID  . free water  200 mL Per Tube Q8H  . heparin  5,000 Units Subcutaneous Q8H  . insulin aspart  0-9 Units Subcutaneous Q4H  . lacosamide  100 mg Oral BID  . levETIRAcetam  500 mg Per Tube BID  . mouth rinse  15 mL Mouth Rinse QID  . midodrine  10 mg Oral TID WC  . saccharomyces boulardii  250 mg Oral BID   Continuous Infusions: . sodium chloride 20 mL/hr at 06/08/16 0942  . feeding supplement (VITAL AF 1.2 CAL) 1,000 mL (06/12/16 0926)     LOS: 14 days    Time spent: 35 minutes    Elmarie Shiley, MD [facility] Pager [phone]  If 7PM-7AM, please contact night-coverage [email or web address] Password [email or web address] 06/12/2016, 11:38 AM

## 2016-06-12 NOTE — Progress Notes (Signed)
eLink Physician-Brief Progress Note Patient Name: Dean Graham DOB: 17-Jun-1961 MRN: [id number]   Date of Service  06/12/2016  HPI/Events of Note  Continues to bleed from trach stoma. PCCM has seen the patient several times today. Bleeding has reoccurred. We have no further useful recommendations.   eICU Interventions  Recommendation is that the patient be seen by ENT at this time for a definitive solution to the bleeding. Defer ENT consultation to primary service.      Intervention Category Major Interventions: Hemorrhage - evaluation and management  Pollie Poma [place] 06/12/2016, 6:20 PM

## 2016-06-12 NOTE — Progress Notes (Signed)
PULMONARY / CRITICAL CARE MEDICINE   Name: Dean Graham MRN: [id number] DOB: [birth year]    ADMISSION DATE:  05/29/2016  REFERRING MD:  EDP/Dr. Hartley Barefoot  CHIEF COMPLAINT:  Pneumonia/fever/AMS, severe sepsis. Recalled 10/3 for blood clot around trach with out further evidence of bleeding.  BRIEF:   55 yo male with previous hx of TBI as a child,AFib on amio, chronic resp failure on trach chronically, GERD, DM II, PEG tube dependend, functional quadriplegia, neurogenic bladder, anemia of chronic disease who presented on 9/19 from [facility] with increased WOB, fever, hypotension (85/66) and leukocytosis (wbc 23).   SUBJECTIVE:  No resp distress.   VITAL SIGNS: BP 100/82   Pulse (!) 108   Temp 97.8 F (36.6 C) (Axillary)   Resp 13   Ht 5\' 10"  (1.778 m)   Wt 167 lb (75.8 kg)   SpO2 100%   BMI 23.96 kg/m   HEMODYNAMICS:    VENTILATOR SETTINGS: Vent Mode: PRVC FiO2 (%):  [30 %] 30 % Set Rate:  [14 bmp] 14 bmp Vt Set:  [460 mL] 460 mL PEEP:  [5 cmH20] 5 cmH20 Plateau Pressure:  [17 cmH20-32 cmH20] 17 cmH20  INTAKE / OUTPUT: I/O last 3 completed shifts: In: 2460 [I.V.:720; NG/GT:1740] Out: 2450 [Urine:2350; Stool:100]  PHYSICAL EXAMINATION: General: on vent, diaphoretic, contracted HENT: Trach in place, no evidence of bleeding around or in trach.NCAT PULM: rhonchi bilaterally, vent supported breaths GI: PEG in place, belly soft, nontender Derm: wound hip Neuro: Awake, on vent  LABS:  None yet.  BMET  Recent Labs Lab 06/10/16 0400 06/11/16 0457 06/12/16 0506  NA 144 141 138  K 4.1 4.5 4.7  CL 112* 108 106  CO2 29 29 29   BUN 28* 26* 26*  CREATININE 0.35* 0.38* 0.34*  GLUCOSE 99 107* 104*    Electrolytes  Recent Labs Lab 06/10/16 0400 06/11/16 0457 06/12/16 0506  CALCIUM 8.0* 7.9* 7.7*  MG 1.7 1.9 1.7    CBC  Recent Labs Lab 06/10/16 0400 06/11/16 0457 06/12/16 0506  WBC 17.4* 13.7* 15.5*  HGB 10.2* 10.0* 9.6*  HCT 31.8* 31.0*  29.7*  PLT 88* 108* 125*    Coag's No results for input(s): APTT, INR in the last 168 hours.  Sepsis Markers  Recent Labs Lab 06/05/16 1235 06/05/16 2000 06/05/16 2236  LATICACIDVEN 2.7* 2.8* 2.7*    ABG No results for input(s): PHART, PCO2ART, PO2ART in the last 168 hours.  Liver Enzymes  Recent Labs Lab 06/06/16 0630  AST 74*  ALT 48  ALKPHOS 122  BILITOT 0.8  ALBUMIN 2.3*    Cardiac Enzymes No results for input(s): TROPONINI, PROBNP in the last 168 hours.  Glucose  Recent Labs Lab 06/11/16 1207 06/11/16 1620 06/11/16 2032 06/11/16 2355 06/12/16 0419 06/12/16 0738  GLUCAP 109* 100* 105* 108* 98 93    Imaging Dg Chest Port 1 View  Result Date: 06/12/2016 CLINICAL DATA:  Bleeding around tracheostomy. EXAM: PORTABLE CHEST 1 VIEW COMPARISON:  06/10/2016 FINDINGS: Tracheostomy appears unchanged and unremarkable. Pleural effusions have enlarged bilaterally with volume loss in both lungs. Airspace barium on the right is unchanged. IMPRESSION: Enlarging effusions bilaterally with worsened volume loss in both lungs. No specific abnormal finding related to the tracheostomy itself. Electronically Signed   By: Paulina Fusi M.D.   On: 06/12/2016 12:01     STUDIES:    CULTURES: BCX 9/19 >> (-) Ucx 9/19 >> (-) 9/25 sputum>Multple bacteria / MDRO  ANTIBIOTICS: Allergic to azithromycin, ceftx, vanc, and ativan Aztreonam  9/19 >>9/19 Linezolid 9/19 >>off meropenem 9/19 >>off SIGNIFICANT EVENTS: 9/20 Admitted with severe sepsis with AMS  LINES/TUBES: PEG tube Trach  DISCUSSION: 55 y/o male with a long history of tracheostomy and PEG tube dependence who presented yesterday with septic shock in the setting of HCAP.  Overall situation quite sad and seems unethical.   10/3 one episode of large clot around trach with out further bleeding. PCCM will be available PRN.  ASSESSMENT / PLAN:  PULMONARY A: Acute on chronic vent dependent resp failure - on trach  for years HCAP > bilateral infiltrates  10/3 large clot found around trach but no hemoptysis Bilateral effsuions P:   Continue current vent settings, no plans for weaning VAP prevention protocol Monitor trach for any further bleeding.  Would not drain effusions as they will just return Please call PCCM if clot reoccurs.   CARDIOVASCULAR A:  Chronic Afib on amio Sinus tach resolved Shock resolved P:  Per IM  RENAL A:   No acute issues P:   Monitor BMET and UOP Replace electrolytes as needed  GASTROINTESTINAL A:   PEG tube dependent Chronic neurogenic bladder P:   Cont tube feeds Cont pepcid Cont foley  HEMATOLOGIC  Recent Labs  06/11/16 0457 06/12/16 0506  HGB 10.0* 9.6*    A:   Chronic anemia of chronic disease hgb worse today but no bleeding leukocytosis P:  Monitor for bleeding. Note old clot from around trach 10/3 without evidence of active bleeder. Transfuse if Hgb < 7gm/dL  INFECTIOUS A:   Severe sepsis from HCAP Multiple wounds throughout buttock, pelvis present on admission P:   Abx per IM Wound care consult  ENDOCRINE    NEUROLOGIC A:   AMS from infection Chronic contracture   P: Monitor for improvement with abx therapy RASS goal: 0 to 1.   FAMILY  - Updates: No family at bedside 10/3  - Inter-disciplinary family meet or Palliative Care meeting due by: 06/05/16.     Overall prognosis poor. He is a DNR   ATTENDING NOTE / ATTESTATION NOTE :   I have discussed the case with the resident/APP Steve Minor.   I agree with the resident/APP's  history, physical examination, assessment, and plans.    I have edited the above note and modified it according to our agreed history, physical examination, assessment and plan.   55 yo male with previous hx of TBI as a child, AFib on amio, chronic resp failure on trach chronically, on chronic vent, GERD, DM II, PEG tube dependend, functional quadriplegia, neurogenic bladder, anemia of  chronic disease who presented on 9/19 from [facility] with increased WOB, fever, hypotension.  ID has been following pt.  He was placed on abx.  Meropenem was discontinued after 10 days per [facility] and (-) evidence for infection currently. Recent trache culture has MDRO and likely contaminant.   PCCM initially saw pt but we have signed off.  PCCM was asked to see pt on 10/3 2/2 blood clots per trache and trache dressing.  By the time I saw pt, no blood clots were seen.   Pt seen, chronically ill. On the vent. 120/70, HR 120, RR 24. 100% o2 sats. Trache was seen, clean, no blood clots seen.  Fair ae. Some crackles at bases. (+) PEG tube. Gr 1 edema. Rest of exam as above.   CXR with more B effusion from baseline. Labs reviewed.   Continue to observe for more blood clots.  Call us back if with recurrence of  blood clots.  No evidence for infection at this time.  ID has signed off.   Regarding B pleural effusion, suggest trying to diuresce patient and see if effusion improves.  He is volume overloaded > maybe we can  cut down free water and switch IV meds to PO.  Pt is DNR and Palliative Care is speaking with family.  PCCM will sign off for now > call back if with issues.  Family : No family at bedside.    Pollie MeyerJ. Angelo A de Dios, MD 06/12/2016, 1:07 PM [facility] Pager [phone] After 3 pm or if no answer, call [phone]

## 2016-06-12 NOTE — Progress Notes (Addendum)
Daily Progress Note   Patient Name: Dean Graham       Date: 06/12/2016 DOB: 11/04/1960  Age: 55 y.o. MRN#: [id number] Attending Physician: Elmarie Shiley, MD Primary Care Physician: Verneita Griffes, MD Admit Date: 05/29/2016  Reason for Consultation/Follow-up: Establishing goals of care  Subjective: Patient denies pain.  He is diaphoretic and has blood around his trach collar.  Family in town.  We are in communication about his status.  Brother Dean Graham does not appear likely to change his desire for Full Scope Treatment  Met with Mother, Sister, Brother at bedside.  Family somewhat alarmed by blood clots oozing from around the [place].  We went to a conference room and again discussed what a transition to comfort care would look like.  His mother and sister are for comfort measures.  His brother will not support this, he feels that as long as Maxen can respond appropriately and indicates that he wants to keep going - it would be wrong to transition to comfort measures.  Family quite upset by bleeding around the trach collar as blood was in the patient's nose and mouth and they were concerned for aspiration.  I discussed the bleeding around the [place] with Dr. Tyrell Antonio - heparin and aspirin stopped.  Checking CBC will transfuse if needed.  I paged PCCM.  After multiple communications CCM recommended ENT consultation.  I conferred again with Dr. Tyrell Antonio and she called ENT.  Dr. Constance Holster consulted last evening.     Length of Stay: 14  Current Medications: Scheduled Meds:  . amiodarone  100 mg Oral Daily  . chlorhexidine gluconate (MEDLINE KIT)  15 mL Mouth Rinse BID  . famotidine  20 mg Per Tube BID  . free water  200 mL Per Tube Q8H  . insulin aspart  0-9 Units Subcutaneous Q4H  . lacosamide   100 mg Oral BID  . levETIRAcetam  500 mg Per Tube BID  . mouth rinse  15 mL Mouth Rinse QID  . midodrine  10 mg Oral TID WC  . saccharomyces boulardii  250 mg Oral BID    Continuous Infusions: . sodium chloride 20 mL/hr at 06/08/16 0942  . feeding supplement (VITAL AF 1.2 CAL) 1,000 mL (06/12/16 0926)    PRN Meds: guaiFENesin, hydroxypropyl methylcellulose / hypromellose, morphine injection, sodium chloride flush  Physical Exam         55 yo male with trach (on vent), Peg (NPO), LE contractures.  Nods yes and no appropriately to questions. Swelling noted in face, eye lids and hands.  Attempts to open eyes. Trach in place, on vent, settings unchanged, NAD, blood oozing slowly from bottom of trach collar CV tachy Abdomen PEG in place.  Scrotal swelling noted.  Vital Signs: BP 135/83   Pulse (!) 111   Temp 97.8 F (36.6 C) (Axillary)   Resp 14   Ht 5\' 10"  (1.778 m)   Wt 75.8 kg (167 lb)   SpO2 100%   BMI 23.96 kg/m  SpO2: SpO2: 100 % O2 Device: O2 Device: Ventilator O2 Flow Rate: O2 Flow Rate (L/min): 5 L/min  Intake/output summary:   Intake/Output Summary (Last 24 hours) at 06/12/16 1640 Last data filed at 06/12/16 0421  Gross per 24 hour  Intake               90 ml  Output              700 ml  Net             -610 ml   LBM: Last BM Date: 06/10/16 Baseline Weight: Weight: 45.4 kg (100 lb) Most recent weight: Weight: 75.8 kg (167 lb)       Palliative Assessment/Data:    Flowsheet Rows   Flowsheet Row Most Recent Value  Intake Tab  Referral Department  Hospitalist  Unit at Time of Referral  Intermediate Care Unit  Palliative Care Primary Diagnosis  Neurology  Date Notified  05/31/16  Palliative Care Type  New Palliative care  Reason for referral  Clarify Goals of Care  Date of Admission  05/29/16  Date first seen by Palliative Care  06/01/16  # of days Palliative referral response time  1 Day(s)  # of days IP prior to Palliative referral  2  Clinical  Assessment  Palliative Performance Scale Score  10%  Psychosocial & Spiritual Assessment  Palliative Care Outcomes  Patient/Family meeting held?  Yes  Who was at the meeting?  Brother 06/03/16 and Mother  Palliative Care Outcomes  Clarified goals of care  Palliative Care follow-up planned  Yes, Facility      Patient Active Problem List   Diagnosis Date Noted  . Tracheostomy care (HCC)   . Hypoalbuminemia   . Cough with frothy sputum   . VAP (ventilator-associated pneumonia) (HCC)   . Carbapenem-resistant Acinetobacter baumannii infection   . History of infection by MDR Stenotrophomonas maltophilia   . History of MDR Pseudomonas aeruginosa infection   . Chronic post traumatic encephalopathy   . Acute encephalopathy   . Hypokalemia   . Hypomagnesemia   . Urinary tract infection, site not specified   . Acute on chronic respiratory failure with hypoxia (HCC)   . Dysphagia   . Decubitus ulcer of sacral area   . Encounter for wound care   . Protein-calorie malnutrition, severe (HCC)   . Palliative care encounter   . Goals of care, counseling/discussion   . Pressure injury of skin 05/31/2016  . Quadriplegia (HCC) 05/31/2016  . Atrial fibrillation (HCC) 05/31/2016  . Diabetes mellitus without complication (HCC) 05/31/2016  . Chronic respiratory failure (HCC) 05/31/2016  . 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 Septic shock (HCC) 05/29/2016  . HCAP (healthcare-associated pneumonia)     Palliative Care Assessment & Plan   Patient Profile: 55 y.o. male  with past  medical history of traumatic brain injury, quadriplegia, dysphagia / peg dependent, atrial fibrillation, diabetes mellitus, diastolic heart failure and chronic respiratory failure/trach dependent who was admitted on 05/29/2016 with severe sepsis thought to be secondary to HCAP as well as urinary source.  He was admitted to the ICU and treated with broad spectrum antibiotics and short term pressors. He is currently vent dependent  and found to have multiple pressure wounds including a stage 4 left hip wound and a stage 3 right hip wound.  He is bed ridden. He has recurrent aspiration.  His albumin was 1.2 (!) on 9/26 and his lactic acid was elevated.  On 9/27 he was significantly hypotensive and his hgb had dropped to 5.7.  He received PRBC transfusion overnight 9/27 and his BP has stabilized.  10/2.  Patient with 3 types of multidrug resistant organisms in his sputum it is undetermined whether these are active infections or colonizers.  Antibiotics were discontinued.  On 10/3 ID believes the bacteria are colonizers and recommends the patient remain off of antibiotics.  If the patient were to spikes a fever or his WBC rises quickly ID recommends a bronchoscopy for deep cultures.  10/3 Pt labs and vitals are stable, but bleeding from the trach collar.  Family unchanged in their decisions.   Assessment: Patient is very slowly dying.  He is being kept alive by midodrine, antibiotics and ventilator support.  Mother and sisters are emotionally exhausted and don't want the patient to suffer.   Brother Dean Graham is still hoping for more time.  I do not see this interfamily conflict being resolved any time soon.  I believe the patient will remain DNR but full scope treatment. .  Recommendations/Plan:  Continue current treatment plan..  If possible stabilize him and d/c to a facility closer to home [facility].)  Will continue to work with the family on moving to comfort measures, but this will likely not happen this admission given interfamily conflict.   Code Status: DNR / DNI  Prognosis:   < 6 weeks perhaps significantly less with continued aspiration and infections.  Discharge Planning:  [place] for rehab with Palliative care service follow-up VS hospital death if the family opts for comfort measures.  Care plan was discussed with the patient's mother, brother, and Dr. Tommy Medal  Thank you for allowing the  Palliative Medicine Team to assist in the care of this patient.   Time In: 4:00 Time Out: 6:30 Total Time 150 min Prolonged Time Billed  yes      Greater than 50%  of this time was spent counseling and coordinating care related to the above assessment and plan.  Melton Alar, PA-C  Please contact Palliative Medicine Team phone at [phone] for questions and concerns.        Family confirms the following reactions to the medications listed as allergies -   Azithromycin - the patient contracted C-diff  Vancomycin - he developed Kathreen Cosier syndrome and was treated at the burn unit at [facility] - made the patient extremely sleepy.  Ceftriaxone - the family does not recall what happened.

## 2016-06-13 DIAGNOSIS — D62 Acute posthemorrhagic anemia: Secondary | ICD-10-CM

## 2016-06-13 DIAGNOSIS — I480 Paroxysmal atrial fibrillation: Secondary | ICD-10-CM | POA: Diagnosis not present

## 2016-06-13 DIAGNOSIS — F0781 Postconcussional syndrome: Secondary | ICD-10-CM | POA: Diagnosis not present

## 2016-06-13 DIAGNOSIS — A419 Sepsis, unspecified organism: Secondary | ICD-10-CM | POA: Diagnosis not present

## 2016-06-13 DIAGNOSIS — R509 Fever, unspecified: Secondary | ICD-10-CM | POA: Diagnosis not present

## 2016-06-13 DIAGNOSIS — R042 Hemoptysis: Secondary | ICD-10-CM

## 2016-06-13 DIAGNOSIS — J9621 Acute and chronic respiratory failure with hypoxia: Secondary | ICD-10-CM | POA: Diagnosis not present

## 2016-06-13 LAB — CBC WITH DIFFERENTIAL/PLATELET
BASOS ABS: 0 10*3/uL (ref 0.0–0.1)
BASOS PCT: 0 %
Basophils Absolute: 0 10*3/uL (ref 0.0–0.1)
Basophils Relative: 0 %
EOS ABS: 0.2 10*3/uL (ref 0.0–0.7)
EOS PCT: 1 %
EOS PCT: 1 %
Eosinophils Absolute: 0.2 10*3/uL (ref 0.0–0.7)
HCT: 21.9 % — ABNORMAL LOW (ref 39.0–52.0)
HEMATOCRIT: 25.1 % — AB (ref 39.0–52.0)
HEMOGLOBIN: 7.9 g/dL — AB (ref 13.0–17.0)
HEMOGLOBIN: 7.1 g/dL — AB (ref 13.0–17.0)
LYMPHS PCT: 11 %
LYMPHS PCT: 13 %
Lymphs Abs: 1.8 10*3/uL (ref 0.7–4.0)
Lymphs Abs: 2.1 10*3/uL (ref 0.7–4.0)
MCH: 30.4 pg (ref 26.0–34.0)
MCH: 31.1 pg (ref 26.0–34.0)
MCHC: 31.5 g/dL (ref 30.0–36.0)
MCHC: 32.4 g/dL (ref 30.0–36.0)
MCV: 96.5 fL (ref 78.0–100.0)
MCV: 96.1 fL (ref 78.0–100.0)
MONOS PCT: 7 %
Monocytes Absolute: 1.1 10*3/uL — ABNORMAL HIGH (ref 0.1–1.0)
Monocytes Absolute: 1.1 10*3/uL — ABNORMAL HIGH (ref 0.1–1.0)
Monocytes Relative: 7 %
NEUTROS PCT: 81 %
NEUTROS PCT: 79 %
Neutro Abs: 13.2 10*3/uL — ABNORMAL HIGH (ref 1.7–7.7)
Neutro Abs: 13 10*3/uL — ABNORMAL HIGH (ref 1.7–7.7)
Platelets: 135 10*3/uL — ABNORMAL LOW (ref 150–400)
Platelets: 120 10*3/uL — ABNORMAL LOW (ref 150–400)
RBC: 2.6 MIL/uL — AB (ref 4.22–5.81)
RBC: 2.28 MIL/uL — AB (ref 4.22–5.81)
RDW: 16.7 % — AB (ref 11.5–15.5)
RDW: 17.4 % — ABNORMAL HIGH (ref 11.5–15.5)
WBC: 16.3 10*3/uL — ABNORMAL HIGH (ref 4.0–10.5)
WBC: 16.4 10*3/uL — ABNORMAL HIGH (ref 4.0–10.5)

## 2016-06-13 LAB — GLUCOSE, CAPILLARY
GLUCOSE-CAPILLARY: 104 mg/dL — AB (ref 65–99)
GLUCOSE-CAPILLARY: 113 mg/dL — AB (ref 65–99)
GLUCOSE-CAPILLARY: 116 mg/dL — AB (ref 65–99)
GLUCOSE-CAPILLARY: 120 mg/dL — AB (ref 65–99)
Glucose-Capillary: 110 mg/dL — ABNORMAL HIGH (ref 65–99)
Glucose-Capillary: 97 mg/dL (ref 65–99)

## 2016-06-13 LAB — MAGNESIUM: Magnesium: 1.8 mg/dL (ref 1.7–2.4)

## 2016-06-13 MED ORDER — LACOSAMIDE 50 MG PO TABS
100.0000 mg | ORAL_TABLET | Freq: Two times a day (BID) | ORAL | Status: DC
  Administered 2016-06-13 – 2016-06-18 (×11): 100 mg
  Filled 2016-06-13 (×11): qty 2

## 2016-06-13 NOTE — Progress Notes (Addendum)
  [facility] [place], [place] [phone]- no beds  [facility] [place], [place] [phone]- no male beds   [place], [place], [place] [phone]- referral under review  [facility], [place], [place]- [phone]- referral under review  [facility] [place], [place]- [phone]- pt owes large amount of money to alternate [facility] facility so they are unable to admit  Burna Sis, [facility] Clinical Social Worker [phone]

## 2016-06-13 NOTE — Progress Notes (Addendum)
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 In ED, pt was febrile with T max of 100.7 F, hypotensive with BP of 72/60 without improvement after 4L IV fluids. He had WBC 23K. Chest x-ray showed bilateral pleural effusions with pneumonia versus atelectasis. Patient was admitted to ICU for possible need for pressor support. Patient was transferred to stepdown unit 05/30/2016. Off antibiotics. Grew pseudomonas from trach aspirate pan resistant, ID think this is colonization. Trying to find sensitivity to Acinetobacter to colistin, amikacin, tygacil zerbexa and the Stenotrophomonas to Bactrim (shoudl have been reported) In case  these organisms later become pathogens. See ID note.    Assessment & Plan:   Principal Problem:   Septic shock ([place]) Active Problems:   HCAP (healthcare-associated pneumonia)   Pressure injury of skin   Quadriplegia (HCC)   Atrial fibrillation (HCC)   Diabetes mellitus without complication ([place])   Chronic respiratory failure (HCC)   Pressure ulcer stage IV   Encounter for wound care   Protein-calorie malnutrition, severe ([place])   Palliative care encounter   Goals of care, counseling/discussion   Chronic post traumatic encephalopathy   Acute encephalopathy   Hypokalemia   Hypomagnesemia   Urinary tract infection, site not specified   Acute on chronic respiratory failure with hypoxia (HCC)   Dysphagia   Decubitus ulcer of sacral area   Cough with frothy sputum   VAP (ventilator-associated pneumonia) (HCC)   Carbapenem-resistant  Acinetobacter baumannii infection   History of infection by MDR Stenotrophomonas maltophilia   History of MDR Pseudomonas aeruginosa infection   Hypoalbuminemia   Tracheostomy care ([place])   Bleeding   Septic shock/HCAP/ UTI Ventilator Associated PNA - Pt with chronic indwelling Foley catheter  - Tracheal aspirate 06/04/16: Showed abundant stenotrophomonas maltophilia, Pseudomonas aeruginosa and abundant Acinetobacter calcoaceticus/baumannii complex. Urine culture 9/25: Negative.Blood cultures 29/25/17: Negative. C. difficile negative - patient remains intermittently hypotensive and mildly tachycardic which may be more related to his acute blood loss anemia than sepsis. Still has mild leukocytosis.  - Chest x-ray on admission showed bilateral pleural effusions with pneumonia vs atelectasis - has recived  Albumin 50 g 2 - Continue midodrine 10 mg TID - [place] negative for abscess.  - Infectious disease continue to see patient. As per their follow-up 06/12/16, his multi-drug-resistant organisms are finally attributed as contaminants at this point and Dr. Drucilla Schmidt has stopped his antibiotics since he has completed full course of antimicrobials for VAP - ID are trying to find susceptibilities of the Acinetobacter to colistin, amikacin, tygacil zerbexa and the Stenotrophomonas to Bactrim (shoudl have been reported)  SHOULD these organisms later become pathogens. Per ID, BEFORE deciding that they are pathogens pts clinical picture would need to change and would want CCM to perform bronchoscopy for deep cultures to guide therapy - ID signed off 06/12/16.  Tracheostomy tract/wound site bleeding - Noted on 06/12/16. ENT was consulted and indicated that the bleeding did not seem to be from the trachea but instead is from the tracheostomy tract/wound. This could be from chronic irritation from the tube. Tracheostomy was changed to a new B[place]ona adjustable length tube  -  no overt  bleeding noted this morning.    Acute blood loss anemia complicating chronic anemia.  - Baseline hemoglobin apparently around 8 g per DL. Patient received 2 units of PRBC on 06/06/16. Hemoglobin had stabilized at 10 g per DL. He developed tracheostomy site bleeding and hemoglobin has gradually dropped to 7.9 on 10/4 - Follow CBCs closely and transfuse if hemoglobin less than 7 g per DL. As per discussion with palliative care team, continued aggressive level of care per family.   Acute on Cventilatory dependent respiratory failure-on tracheostomy 4 years -  continue current when settings without plans for weaning.  - CCM evaluated 10/3 and signed off Mucinex.   Diarrhea;  Still with diarrhea. Has rectal tube. C diff negative 9-25 Added  florastore.   Mild troponin elevation - Due to demand ischemic from septic shock - Troponin 0.09 -->0.07  - No further work up needed at this time  -ECHO no wall motion abnormalities.   Acute encephalopathy/Chronic Encephalopathy secondary to TBI as a child - Due to septic shock - able to move head to answer yes and no.  -  apparently mental status at baseline.   Hypernatremia;  Resolved.  Hyperkalemia / Hypokalemia  - resolved  Hypomagnesemia - resolved  Thrombocytopenia;  - related to infection. Slowly improving.   Chronic diastolic CHF -Echocardiogram normal Ef  - clinically with anasarca. Intermittent hypotension limiting diuretic use.  Chronic/paroxysmal atrial fibrillation(CHADS vasc score 2) - On amiodarone and aspirin  - Sinus rhythm on monitor.   Dysphagia, PEG tube-dependent / Severe protein calorie malnutrition  - Chronic, stable  - Continue tube feeds  - Family requesting PEG tube to be changed-we'll consult IR.   Quadriplegia - Stable . Has significant contractures.   Neurogenic bladder - Chronic, stable - Has Foley catheter in place  Seizure disorder - Continue keppra, vimpat  Stage 4 sacral pressure ulcer, stage 3 right and  left hip sacral ulcer - Appreciate wound care assessment - Stage 4Sacrum 10cm x 8cm x 0.2cm with 100% red gran tissue, mod amtdrainage green, mild odor - Healing stage 3 righthip 2.5cm x 2 cm 100 % red granulating, no drainage - Left hip Stage 3 ulcer 4.5cm x 7cm x 1cm, 100% red granulating, modamt green drainage, mild odor  -Left great toe full thickness 0.8cm x 0.8cm 100% red gran tissue, no drainage or odor - L flank MARSI 0.5cm x 0.3cm pink and dry - L knee full thickness 2.5cm x 2cm red and moistno drainage or odor noted - L heel Stage 2 0.3cm x 0.3cm pink and moist no drainage or odor - Per WOC, apply quacell for sacrum and left hip to absorb drainage and provide antimicrobial benifitsand allevyn foam to the others - Placed on a mattress and ensure on rotation at all times  Adult failure to thrive - Multifactorial secondary to TBI and multiple severe significant comorbidities. As per discussion with palliative care team on 10/4, even though patient's mother seems to be agreeable to comfort oriented care, patient's brother wishes continued aggressive care.   DVT prophylaxis: Heparin-was held on 10/3 secondary to tracheostomy site bleeding. Unable to use SCD secondary to severe contractures. Code Status: DNR Family Communication: None at bedside Disposition Plan: To be determined. Possible LTAC when stable.   Consultants:   Infectious disease  CCM  ENT  Procedures:   R midline PICC 9/19>  Trach> changed by ENT 10/3  Foley catheter  Rectal tube  PEG  Antimicrobials:  Meropenem 9/20 >> Linezolid 9/19 >>  9/25 Aztreonam 9/19 >> 9/19 Levaquin 9/19 >>9/19   Subjective: Nonverbal. As per RN, no acute issues overnight. No further bleeding noted from tracheostomy site.  Objective:  Vitals:   06/13/16 0800 06/13/16 0851 06/13/16 0900 06/13/16 1143  BP: 97/77 100/76 (!) 89/73   Pulse: (!) 115 100 (!) 113 (!) 106  Resp: (!) '21 18 18   '$ Temp:      TempSrc:       SpO2: 99%  100%   Weight:      Height:      Temperature: 98.73F.   Intake/Output Summary (Last 24 hours) at 06/13/16 1247 Last data filed at 06/13/16 1000  Gross per 24 hour  Intake             2490 ml  Output             1450 ml  Net             1040 ml   Filed Weights   06/12/16 0000 06/12/16 0432 06/13/16 0414  Weight: 75.8 kg (167 lb) 75.8 kg (167 lb) 73.5 kg (162 lb)    Examination:  General exam: Chronically ill-looking middle-aged male lying propped up in bed. Respiratory system: Reduced breath sounds in the bases. Harsh breath sounds elsewhere.Marland Kitchen Respiratory effort normal. Cardiovascular system: S1 & S2 heard, RRR. No JVD, murmurs, rubs, gallops or clicks. 1+  pedal edema.Telemetry: Sinus rhythm.  Gastrointestinal system: Abdomen is nondistended, soft and nontender. No organomegaly or masses felt. Normal bowel sounds heard.PEG tube +. Rectal tube with liquid green stools.  Central nervous system: When seen this morning, eyes closed, would not open eyes to call or touch or follow any instructions.  Extremities: Contractures of all 4 extremities with edema and multiple wounds on lower extremities  Skin: Multiple wounds as per wound care team.  Psychiatry: Cannot be assessed due to TBI.    Data Reviewed: I have personally reviewed following labs and imaging studies  CBC:  Recent Labs Lab 06/09/16 0411 06/10/16 0400 06/11/16 0457 06/12/16 0506 06/12/16 1842 06/13/16 0400  WBC 16.3* 17.4* 13.7* 15.5*  --  16.3*  NEUTROABS 12.8* 12.6* 10.3* 12.2*  --  13.2*  HGB 10.4* 10.2* 10.0* 9.6* 8.6* 7.9*  HCT 32.0* 31.8* 31.0* 29.7* 27.3* 25.1*  MCV 94.7 95.2 93.9 94.0  --  96.5  PLT 99* 88* 108* 125*  --  142*   Basic Metabolic Panel:  Recent Labs Lab 06/09/16 0411 06/09/16 1410 06/10/16 0400 06/11/16 0457 06/12/16 0506 06/13/16 0400  NA 142 140 144 141 138  --   K 5.6* 4.0 4.1 4.5 4.7  --   CL 112* 111 112* 108 106  --   CO2 '27 27 29 29 29  '$ --   GLUCOSE 106*  105* 99 107* 104*  --   BUN 32* 28* 28* 26* 26*  --   CREATININE 0.39* 0.34* 0.35* 0.38* 0.34*  --   CALCIUM 8.0* 7.8* 8.0* 7.9* 7.7*  --   MG 2.1  --  1.7 1.9 1.7 1.8   GFR: Estimated Creatinine Clearance: 105.2 mL/min (by C-G formula based on SCr of 0.34 mg/dL (L)). Liver Function Tests: No results for input(s): AST, ALT, ALKPHOS, BILITOT, PROT, ALBUMIN in the last 168 hours. No results for input(s): LIPASE, AMYLASE in the last 168 hours. No results for input(s): AMMONIA in the last 168 hours. Coagulation Profile:  Recent Labs Lab 06/12/16 1831  INR 1.65   Cardiac Enzymes: No results for input(s): CKTOTAL,  CKMB, CKMBINDEX, TROPONINI in the last 168 hours. BNP (last 3 results) No results for input(s): PROBNP in the last 8760 hours. HbA1C: No results for input(s): HGBA1C in the last 72 hours. CBG:  Recent Labs Lab 06/12/16 2049 06/12/16 2325 06/13/16 0419 06/13/16 0732 06/13/16 1243  GLUCAP 80 123* 110* 120* 116*   Lipid Profile: No results for input(s): CHOL, HDL, LDLCALC, TRIG, CHOLHDL, LDLDIRECT in the last 72 hours. Thyroid Function Tests: No results for input(s): TSH, T4TOTAL, FREET4, T3FREE, THYROIDAB in the last 72 hours. Anemia Panel: No results for input(s): VITAMINB12, FOLATE, FERRITIN, TIBC, IRON, RETICCTPCT in the last 72 hours.  Sepsis Labs: No results for input(s): PROCALCITON, LATICACIDVEN in the last 168 hours.  Recent Results (from the past 240 hour(s))  Susceptibility, Aer + Anaerob     Status: None   Collection Time: 06/04/16  9:55 AM  Result Value Ref Range Status   Suscept, Aer + Anaerob Preliminary report  Final    Comment: (NOTE) Performed At: [facility] [place], [place] [id number] Lindon Romp MD FY:[id number]    Source of Sample TRACHEAL ASPIRATE  Final    Comment: STENOTROPHOMONAS MALTOPHILIA FOR BACTRIM  Susceptibility Result     Status: Abnormal (Preliminary result)   Collection Time: 06/04/16  9:55 AM   Result Value Ref Range Status   Suscept Result 1 Comment (A)  Final    Comment: (NOTE) Stenotrophomonas maltophilia Identification performed by account, not confirmed by this laboratory. Performed At: [facility] [place], [place] [id number] Lindon Romp MD [id number]    Antimicrobial Suscept PENDING  Incomplete  Susceptibility, Aer + Anaerob     Status: None   Collection Time: 06/04/16  9:56 AM  Result Value Ref Range Status   Suscept, Aer + Anaerob Preliminary report  Final    Comment: (NOTE) Performed At: [facility] [place], [place] [id number] Lindon Romp MD TO:[id number]    Source of Sample TRACHEAL ASPIRATE  Final    Comment: PSEUDOMONAS AERUGINOSA FOR ZERBAXA POLYMYXIN B TO FOCUS  Susceptibility Result     Status: Abnormal (Preliminary result)   Collection Time: 06/04/16  9:56 AM  Result Value Ref Range Status   Suscept Result 1 Comment (A)  Final    Comment: (NOTE) Pseudomonas aeruginosa Identification performed by account, not confirmed by this laboratory. Performed At: [facility] [place], [place] [id number] Lindon Romp MD ZJ:[id number]    Antimicrobial Suscept PENDING  Incomplete  Susceptibility, Aer + Anaerob     Status: None   Collection Time: 06/04/16  9:58 AM  Result Value Ref Range Status   Suscept, Aer + Anaerob Preliminary report  Final    Comment: (NOTE) Performed At: [facility] [place], [place] [id number] Lindon Romp MD DJ:[id number]    Source of Sample PSEUDOMONAS AERUGINOSA FOR AMIKACIN, TIGECYCLINE  Final  Susceptibility Result     Status: Abnormal (Preliminary result)   Collection Time: 06/04/16  9:58 AM  Result Value Ref Range Status   Suscept Result 1 Comment (A)  Final    Comment: (NOTE) Pseudomonas aeruginosa Identification performed by account, not confirmed by this laboratory. Performed At: [facility] [place], [place] [id number] Lindon Romp MD [id number]    Antimicrobial Suscept PENDING  Incomplete  Susceptibility, Aer + Anaerob     Status: None   Collection Time: 06/04/16 10:01 AM  Result Value Ref Range Status  Suscept, Aer + Anaerob Preliminary report  Final    Comment: (NOTE) Performed At: [facility] [place], [place] [id number] Lindon Romp MD WU:[id number]    Source of Sample TRACHEAL ASPIRATE  Final    Comment: ACINETOBACTER BAUMANII FOR ZERBAXA, POLYMYXIN B TO FOCUS  Susceptibility Result     Status: Abnormal (Preliminary result)   Collection Time: 06/04/16 10:01 AM  Result Value Ref Range Status   Suscept Result 1 Comment (A)  Final    Comment: (NOTE) Acinetobacter baumannii Identification performed by account, not confirmed by this laboratory. Performed At: [facility] [place], [place] [id number] Lindon Romp MD VH:[id number]    Antimicrobial Suscept PENDING  Incomplete  Susceptibility, Aer + Anaerob     Status: None   Collection Time: 06/04/16 10:03 AM  Result Value Ref Range Status   Suscept, Aer + Anaerob Preliminary report  Final    Comment: (NOTE) Performed At: [facility] [place], [place] [id number] Lindon Romp MD UV:[id number]    Source of Sample TRACHEAL ASPIRATE  Final    Comment: ACINETOBACTER BAUMANII FOR AMIKACIN, TIGECYCLINE  Susceptibility Result     Status: Abnormal (Preliminary result)   Collection Time: 06/04/16 10:03 AM  Result Value Ref Range Status   Suscept Result 1 Comment (A)  Final    Comment: (NOTE) Acinetobacter baumannii Identification performed by account, not confirmed by this laboratory. Performed At: [facility] [place], [place] [id number] Lindon Romp MD VF:[id number]    Antimicrobial Suscept PENDING  Incomplete  Culture, blood (routine x 2)     Status: None   Collection  Time: 06/04/16  5:42 PM  Result Value Ref Range Status   Specimen Description BLOOD LEFT HAND  Final   Special Requests IN PEDIATRIC BOTTLE  3CC  Final   Culture NO GROWTH 5 DAYS  Final   Report Status 06/09/2016 FINAL  Final  Culture, blood (routine x 2)     Status: None   Collection Time: 06/04/16  5:50 PM  Result Value Ref Range Status   Specimen Description BLOOD LEFT HAND  Final   Special Requests BOTTLES DRAWN AEROBIC ONLY  5CC  Final   Culture NO GROWTH 5 DAYS  Final   Report Status 06/09/2016 FINAL  Final  C difficile quick scan w PCR reflex     Status: None   Collection Time: 06/04/16  6:30 PM  Result Value Ref Range Status   C Diff antigen NEGATIVE NEGATIVE Final   C Diff toxin NEGATIVE NEGATIVE Final   C Diff interpretation No C. difficile detected.  Final  Urine culture     Status: None   Collection Time: 06/04/16  8:33 PM  Result Value Ref Range Status   Specimen Description URINE, CATHETERIZED  Final   Special Requests NONE  Final   Culture NO GROWTH  Final   Report Status 06/06/2016 FINAL  Final  Culture, respiratory (NON-Expectorated)     Status: None (Preliminary result)   Collection Time: 06/04/16 11:28 PM  Result Value Ref Range Status   Specimen Description TRACHEAL ASPIRATE  Final   Special Requests NONE  Final   Gram Stain   Final    RARE SQUAMOUS EPITHELIAL CELLS PRESENT FEW WBC PRESENT,BOTH PMN AND MONONUCLEAR MODERATE GRAM NEGATIVE RODS    Culture   Final    ABUNDANT STENOTROPHOMONAS MALTOPHILIA ABUNDANT PSEUDOMONAS AERUGINOSA ABUNDANT ACINETOBACTER CALCOACETICUS/BAUMANNII COMPLEX Results Called to: J. Emden, AT [phone]  06/09/16 BY D. VANHOOK MULTI-DRUG RESISTANT ORGANISM Sent to [facility] for further susceptibility testing.    Report Status PENDING  Incomplete   Organism ID, Bacteria PSEUDOMONAS AERUGINOSA  Final   Organism ID, Bacteria STENOTROPHOMONAS MALTOPHILIA  Final   Organism ID, Bacteria ACINETOBACTER CALCOACETICUS/BAUMANNII COMPLEX   Final      Susceptibility   Acinetobacter calcoaceticus/baumannii complex - MIC*    CEFTAZIDIME >=64 RESISTANT Resistant     CEFTRIAXONE >=64 RESISTANT Resistant     CIPROFLOXACIN >=4 RESISTANT Resistant     GENTAMICIN 8 INTERMEDIATE Intermediate     IMIPENEM >=16 RESISTANT Resistant     PIP/TAZO >=128 RESISTANT Resistant     TRIMETH/SULFA 80 RESISTANT Resistant     CEFEPIME >=64 RESISTANT Resistant     AMPICILLIN/SULBACTAM >=32 RESISTANT Resistant     * ABUNDANT ACINETOBACTER CALCOACETICUS/BAUMANNII COMPLEX   Pseudomonas aeruginosa - MIC*    CEFTAZIDIME 4 SENSITIVE Sensitive     CIPROFLOXACIN >=4 RESISTANT Resistant     GENTAMICIN >=16 RESISTANT Resistant     IMIPENEM >=16 RESISTANT Resistant     CEFEPIME 16 INTERMEDIATE Intermediate     * ABUNDANT PSEUDOMONAS AERUGINOSA   Stenotrophomonas maltophilia - MIC*    LEVOFLOXACIN 4 INTERMEDIATE Intermediate     * ABUNDANT STENOTROPHOMONAS MALTOPHILIA         Radiology Studies: Dg Chest Port 1 View  Result Date: 06/12/2016 CLINICAL DATA:  Bleeding around tracheostomy. EXAM: PORTABLE CHEST 1 VIEW COMPARISON:  06/10/2016 FINDINGS: Tracheostomy appears unchanged and unremarkable. Pleural effusions have enlarged bilaterally with volume loss in both lungs. Airspace barium on the right is unchanged. IMPRESSION: Enlarging effusions bilaterally with worsened volume loss in both lungs. No specific abnormal finding related to the tracheostomy itself. Electronically Signed   By: Nelson Chimes M.D.   On: 06/12/2016 12:01        Scheduled Meds: . amiodarone  100 mg Oral Daily  . chlorhexidine gluconate (MEDLINE KIT)  15 mL Mouth Rinse BID  . famotidine  20 mg Per Tube BID  . free water  200 mL Per Tube Q8H  . insulin aspart  0-9 Units Subcutaneous Q4H  . lacosamide  100 mg Oral BID  . levETIRAcetam  500 mg Per Tube BID  . mouth rinse  15 mL Mouth Rinse QID  . midodrine  10 mg Oral TID WC  . saccharomyces boulardii  250 mg Oral BID    Continuous Infusions: . sodium chloride 20 mL/hr at 06/13/16 0208  . feeding supplement (VITAL AF 1.2 CAL) 1,000 mL (06/13/16 0207)     LOS: 15 days    Time spent: 40 minutes.    [facility], MD [facility] Pager [phone]  If 7PM-7AM, please contact night-coverage [email or web address] Password [email or web address] 06/13/2016, 12:47 PM

## 2016-06-13 NOTE — Consult Note (Addendum)
WOC Nurse wound re-consult: Wound type: Consult requested for trach site.  It was reported to have large amt bleeding yesterday and ENT was consulted; refer to Dr Lucky Rathke note.  No further bleeding from trach at this time. Measurement:  Full thickness wound located below the face plate to anterior neck, .8X1X.8 cm at 12:00 o'clock of trach cannula. Location and appearance are NOT related to pressure from the face plate of the trach. Wound bed: Dark brown wound bed Drainage (amount, consistency, odor) Small amt tan drainage from around trach, no odor Dressing procedure/placement/frequency: Aquacel to absorb drainage and provide antimicrobial benefits, foam dressing to protect from further injury.  Please re-consult if further assistance is needed.  Thank-you,  Cammie Mcgee MSN, RN, CWOCN, [place], CNS [phone]

## 2016-06-13 NOTE — Progress Notes (Signed)
No charge note.  Family requests that the PEG tube be changed if possible.  It is 57 years old and has a foul odor.  They are concerned it may be an infection source.  Algis DownsMarianne York, [place] JerseyPA-C Palliative Medicine Pager: [phone]

## 2016-06-13 NOTE — Progress Notes (Addendum)
Patient ID: Dean Graham, male   DOB: 05-Feb-1961, 55 y.o.   MRN: [id number] Request made for exchange of gastrostomy tube due to family's wishes over possible concern for infection as the tube has been present for at least 7 years.  Upon evaluation of the tube itself, it appears this is a Kangaroo tube.  [facility]  The old Kangaroo tubes are disc retention.  These are unable to be removed hence the longevity of the tube.  The tube itself does not appear to be infected and is functioning well.  It would be very uncommon for a g-tube itself to be the source of infection without any outwards signs.  The new Kangaroo tubes are apparently balloon retention, which are removable.  If felt to be warranted medically to pursue possible exchange, we would recommend a noncontrasted CT of the abdomen to evaluate for the exact type of g-tube this is.  If this is not felt to be necessary by the primary service, then we would recommend leaving his g-tube in place with no further actions as it clinically appears without infection.  Please reconsult if felt we are needed.  I tried to call his mother back to let her know this new information since I last spoke to her and the phone went to voicemail.  Lillan Mccreadie E 3:46 PM 06/13/2016

## 2016-06-14 DIAGNOSIS — R6521 Severe sepsis with septic shock: Secondary | ICD-10-CM | POA: Diagnosis not present

## 2016-06-14 DIAGNOSIS — R509 Fever, unspecified: Secondary | ICD-10-CM | POA: Diagnosis not present

## 2016-06-14 DIAGNOSIS — Z515 Encounter for palliative care: Secondary | ICD-10-CM | POA: Diagnosis not present

## 2016-06-14 DIAGNOSIS — G9340 Encephalopathy, unspecified: Secondary | ICD-10-CM

## 2016-06-14 DIAGNOSIS — Z7189 Other specified counseling: Secondary | ICD-10-CM | POA: Diagnosis not present

## 2016-06-14 DIAGNOSIS — I480 Paroxysmal atrial fibrillation: Secondary | ICD-10-CM | POA: Diagnosis not present

## 2016-06-14 DIAGNOSIS — R601 Generalized edema: Secondary | ICD-10-CM

## 2016-06-14 DIAGNOSIS — R58 Hemorrhage, not elsewhere classified: Secondary | ICD-10-CM

## 2016-06-14 DIAGNOSIS — A419 Sepsis, unspecified organism: Secondary | ICD-10-CM | POA: Diagnosis not present

## 2016-06-14 DIAGNOSIS — J9621 Acute and chronic respiratory failure with hypoxia: Secondary | ICD-10-CM | POA: Diagnosis not present

## 2016-06-14 LAB — CBC WITH DIFFERENTIAL/PLATELET
BASOS ABS: 0 10*3/uL (ref 0.0–0.1)
Basophils Relative: 0 %
Eosinophils Absolute: 0.2 10*3/uL (ref 0.0–0.7)
Eosinophils Relative: 1 %
HEMATOCRIT: 23.8 % — AB (ref 39.0–52.0)
HEMOGLOBIN: 7.5 g/dL — AB (ref 13.0–17.0)
LYMPHS PCT: 12 %
Lymphs Abs: 2.1 10*3/uL (ref 0.7–4.0)
MCH: 30.9 pg (ref 26.0–34.0)
MCHC: 31.5 g/dL (ref 30.0–36.0)
MCV: 97.9 fL (ref 78.0–100.0)
MONO ABS: 1 10*3/uL (ref 0.1–1.0)
Monocytes Relative: 6 %
NEUTROS ABS: 14.8 10*3/uL — AB (ref 1.7–7.7)
NEUTROS PCT: 81 %
Platelets: 144 10*3/uL — ABNORMAL LOW (ref 150–400)
RBC: 2.43 MIL/uL — ABNORMAL LOW (ref 4.22–5.81)
RDW: 18 % — AB (ref 11.5–15.5)
WBC: 18 10*3/uL — ABNORMAL HIGH (ref 4.0–10.5)

## 2016-06-14 LAB — SUSCEPTIBILITY RESULT

## 2016-06-14 LAB — BASIC METABOLIC PANEL
ANION GAP: 7 (ref 5–15)
BUN: 29 mg/dL — ABNORMAL HIGH (ref 6–20)
CHLORIDE: 105 mmol/L (ref 101–111)
CO2: 29 mmol/L (ref 22–32)
Calcium: 8 mg/dL — ABNORMAL LOW (ref 8.9–10.3)
Creatinine, Ser: <0.3 mg/dL — ABNORMAL LOW (ref 0.61–1.24)
GLUCOSE: 97 mg/dL (ref 65–99)
POTASSIUM: 4.8 mmol/L (ref 3.5–5.1)
Sodium: 141 mmol/L (ref 135–145)

## 2016-06-14 LAB — SUSCEPTIBILITY, AER + ANAEROB

## 2016-06-14 LAB — GLUCOSE, CAPILLARY
GLUCOSE-CAPILLARY: 109 mg/dL — AB (ref 65–99)
GLUCOSE-CAPILLARY: 105 mg/dL — AB (ref 65–99)
Glucose-Capillary: 111 mg/dL — ABNORMAL HIGH (ref 65–99)
Glucose-Capillary: 100 mg/dL — ABNORMAL HIGH (ref 65–99)
Glucose-Capillary: 112 mg/dL — ABNORMAL HIGH (ref 65–99)

## 2016-06-14 MED ORDER — LOPERAMIDE HCL 1 MG/5ML PO LIQD
1.0000 mg | Freq: Two times a day (BID) | ORAL | Status: AC
  Administered 2016-06-14 (×2): 1 mg
  Filled 2016-06-14 (×2): qty 5

## 2016-06-14 MED ORDER — FUROSEMIDE 10 MG/ML IJ SOLN
20.0000 mg | Freq: Once | INTRAMUSCULAR | Status: AC
  Administered 2016-06-14: 20 mg via INTRAVENOUS
  Filled 2016-06-14: qty 2

## 2016-06-14 NOTE — Progress Notes (Signed)
Daily Progress Note   Patient Name: Dean Graham       Date: 06/14/2016 DOB: 1961/04/14  Age: 55 y.o. MRN#: [id number] Attending Physician: Modena Jansky, MD Primary Care Physician: Verneita Griffes, MD Admit Date: 05/29/2016  Reason for Consultation/Follow-up: Disposition and Psychosocial/spiritual support  Subjective: Dean Graham's brother [name] texted me to ask for a status.  I saw Dean Graham earlier today.  He was slightly less responsive than usual, but still nodded appropriately and denied pain.  He is still diffusely edematous.  WBC is creeping up (chronic inflammation?) his Hgb is stable.  The trach collar is not bleeding. We discussed IRs consultation and why the PEG would not be changed.  I  indicated that Dean Graham may be discharged soon.   Dean Graham was appreciative and said he would try to visit Dean Graham in person tomorrow.    Length of Stay: 16  Current Medications: Scheduled Meds:  . amiodarone  100 mg Oral Daily  . chlorhexidine gluconate (MEDLINE KIT)  15 mL Mouth Rinse BID  . famotidine  20 mg Per Tube BID  . free water  200 mL Per Tube Q8H  . lacosamide  100 mg Per Tube BID  . levETIRAcetam  500 mg Per Tube BID  . loperamide  1 mg Per Tube BID  . mouth rinse  15 mL Mouth Rinse QID  . midodrine  10 mg Oral TID WC  . saccharomyces boulardii  250 mg Oral BID    Continuous Infusions: . feeding supplement (VITAL AF 1.2 CAL) 1,000 mL (06/14/16 1020)    PRN Meds: guaiFENesin, hydroxypropyl methylcellulose / hypromellose, morphine injection, sodium chloride flush  Physical Exam         55 yo male with trach (on vent), Peg (NPO), LE contractures.  Nods yes and no appropriately to questions. Swelling noted in face, eye lids and hands.  Attempts to open eyes. Trach in place, on  vent, settings unchanged, NAD, clear secretions noted. CV tachy Abdomen PEG in place.  Scrotal swelling noted.  Vital Signs: BP 95/73 (BP Location: Left Arm)   Pulse 92   Temp 98.2 F (36.8 C) (Oral)   Resp 14   Ht '5\' 10"'$  (1.778 m)   Wt 74.8 kg (165 lb)   SpO2 100%   BMI 23.68 kg/m  SpO2: SpO2: 100 % O2  Device: O2 Device: Ventilator O2 Flow Rate: O2 Flow Rate (L/min): 5 L/min  Intake/output summary:   Intake/Output Summary (Last 24 hours) at 06/14/16 1637 Last data filed at 06/14/16 1600  Gross per 24 hour  Intake             3155 ml  Output             3150 ml  Net                5 ml   LBM: Last BM Date: 06/12/16 Baseline Weight: Weight: 45.4 kg (100 lb) Most recent weight: Weight: 74.8 kg (165 lb)       Palliative Assessment/Data:    Flowsheet Rows   Flowsheet Row Most Recent Value  Intake Tab  Referral Department  Hospitalist  Unit at Time of Referral  Intermediate Care Unit  Palliative Care Primary Diagnosis  Neurology  Date Notified  05/31/16  Palliative Care Type  New Palliative care  Reason for referral  Clarify Goals of Care  Date of Admission  05/29/16  Date first seen by Palliative Care  06/01/16  # of days Palliative referral response time  1 Day(s)  # of days IP prior to Palliative referral  2  Clinical Assessment  Palliative Performance Scale Score  10%  Psychosocial & Spiritual Assessment  Palliative Care Outcomes  Patient/Family meeting held?  Yes  Who was at the meeting?  Brother Social research officer, government and Mother  Palliative Care Outcomes  Clarified goals of care  Palliative Care follow-up planned  Yes, Facility      Patient Active Problem List   Diagnosis Date Noted  . Bleeding   . Tracheostomy care ([place])   . Hypoalbuminemia   . Cough with frothy sputum   . VAP (ventilator-associated pneumonia) ([place])   . Carbapenem-resistant Acinetobacter baumannii infection   . History of infection by MDR Stenotrophomonas maltophilia   . History of MDR  Pseudomonas aeruginosa infection   . Chronic post traumatic encephalopathy   . Acute encephalopathy   . Hypokalemia   . Hypomagnesemia   . Urinary tract infection, site not specified   . Acute on chronic respiratory failure with hypoxia ([place])   . Dysphagia   . Decubitus ulcer of sacral area   . Encounter for wound care   . Protein-calorie malnutrition, severe ([place])   . Palliative care encounter   . Goals of care, counseling/discussion   . Pressure injury of skin 05/31/2016  . Quadriplegia ([place]) 05/31/2016  . Atrial fibrillation ([place]) 05/31/2016  . Diabetes mellitus without complication ([place]) 16/06/9603  . Chronic respiratory failure ([place]) 05/31/2016  . 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 Septic shock ([place]) 05/29/2016  . HCAP (healthcare-associated pneumonia)     Palliative Care Assessment & Plan   Patient Profile: 55 y.o. male  with past medical history of traumatic brain injury, quadriplegia, dysphagia / peg dependent, atrial fibrillation, diabetes mellitus, diastolic heart failure and chronic respiratory failure/trach dependent who was admitted on 05/29/2016 with severe sepsis thought to be secondary to HCAP as well as urinary source.  He was admitted to the ICU and treated with broad spectrum antibiotics and short term pressors. He is currently vent dependent and found to have multiple pressure wounds including a stage 4 left hip wound and a stage 3 right hip wound.  He is bed ridden. He has recurrent aspiration.  His albumin was 1.2 (!) on 9/26 and his lactic acid was elevated.  On 9/27 he was significantly hypotensive  and his hgb had dropped to 5.7.  He received PRBC transfusion overnight 9/27 and his BP has stabilized.  10/2.  Patient with 3 types of multidrug resistant organisms in his sputum it is undetermined whether these are active infections or colonizers.  Antibiotics were discontinued.  On 10/3 ID believes the bacteria are colonizers and recommends the patient remain off  of antibiotics.  If the patient were to spikes a fever or his WBC rises quickly ID recommends a bronchoscopy for deep cultures.  10/3 Pt labs and vitals are stable, but bleeding from the trach collar.  Family unchanged in their decisions.   Assessment: Patient is very slowly dying.  He is being kept alive by midodrine, antibiotics and ventilator support.  Mother and sisters are emotionally exhausted and don't want the patient to suffer.   Brother [name] is still hoping for more time.  I do not see this interfamily conflict being resolved any time soon.  I believe the patient will remain DNR but full scope treatment. .  Recommendations/Plan:  Continue current treatment plan..  If possible stabilize him and d/c to a facility closer to home [facility].)  DNR/Full scope treatment.  Please place "Palliative Care to Follow Outpatient" in discharge summary instructions.   Code Status: DNR / DNI  Prognosis:   < 6 weeks perhaps significantly less with continued aspiration and infections.  Discharge Planning:  [facility] for rehab with Palliative care service follow-up VS hospital death if the family opts for comfort measures.  Care plan was discussed with [facility] Attending and Lindon Romp.  Thank you for allowing the Palliative Medicine Team to assist in the care of this patient.   Time In: 2:00 Time Out: 2:15 Total Time 15 min Prolonged Time Billed  yes      Greater than 50%  of this time was spent counseling and coordinating care related to the above assessment and plan.  Melton Alar, PA-C  Please contact Palliative Medicine Team phone at [phone] for questions and concerns.        Family confirms the following reactions to the medications listed as allergies -   Azithromycin - the patient contracted C-diff  Vancomycin - he developed Kathreen Cosier syndrome and was treated at the burn unit at [facility] - made the patient extremely sleepy.  Ceftriaxone -  the family does not recall what happened.

## 2016-06-14 NOTE — Progress Notes (Addendum)
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 In ED, pt was febrile with T max of 100.7 F, hypotensive with BP of 72/60 without improvement after 4L IV fluids. He had WBC 23K. Chest x-ray showed bilateral pleural effusions with pneumonia versus atelectasis. Patient was admitted to ICU for possible need for pressor support. Patient was transferred to stepdown unit 05/30/2016. Off antibiotics. Grew pseudomonas from trach aspirate pan resistant, ID think this is colonization. Trying to find sensitivity to Acinetobacter to colistin, amikacin, tygacil zerbexa and the Stenotrophomonas to Bactrim (shoudl have been reported) In case these organisms later become pathogens. See ID note.    Assessment & Plan:   Principal Problem:   Septic shock ([place]) Active Problems:   HCAP (healthcare-associated pneumonia)   Pressure injury of skin   Quadriplegia (HCC)   Atrial fibrillation (HCC)   Diabetes mellitus without complication ([place])   Chronic respiratory failure (HCC)   Pressure ulcer stage IV   Encounter for wound care   Protein-calorie malnutrition, severe ([place])   Palliative care encounter   Goals of care, counseling/discussion   Chronic post traumatic encephalopathy   Acute encephalopathy   Hypokalemia   Hypomagnesemia   Urinary tract infection, site not specified   Acute on chronic respiratory failure with hypoxia (HCC)   Dysphagia   Decubitus ulcer of sacral area   Cough with frothy sputum   VAP (ventilator-associated pneumonia) (HCC)   Carbapenem-resistant  Acinetobacter baumannii infection   History of infection by MDR Stenotrophomonas maltophilia   History of MDR Pseudomonas aeruginosa infection   Hypoalbuminemia   Tracheostomy care ([place])   Bleeding   Septic shock/HCAP/ UTI Ventilator Associated PNA - Pt with chronic indwelling Foley catheter  - Tracheal aspirate 06/04/16: Showed abundant stenotrophomonas maltophilia, Pseudomonas aeruginosa and abundant Acinetobacter calcoaceticus/baumannii complex. Urine culture 9/25: Negative.Blood cultures 29/25/17: Negative. C. difficile negative - patient remains intermittently hypotensive and mildly tachycardic which may be more related to his acute blood loss anemia than sepsis. Still has mild leukocytosis.  - Chest x-ray on admission showed bilateral pleural effusions with pneumonia vs atelectasis - has recived  Albumin 50 g 2 - Continue midodrine 10 mg TID - [place] negative for abscess.  - Infectious disease continue to see patient. As per their follow-up 06/12/16, his multi-drug-resistant organisms are finally attributed as contaminants at this point and Dr. Drucilla Schmidt has stopped his antibiotics since he has completed full course of antimicrobials for VAP - ID are trying to find susceptibilities of the Acinetobacter to colistin, amikacin, tygacil zerbexa and the Stenotrophomonas to Bactrim (shoudl have been reported)  SHOULD these organisms later become pathogens. Per ID, BEFORE deciding that they are pathogens pts clinical picture would need to change and would want CCM to perform bronchoscopy for deep cultures to guide therapy - ID signed off 06/12/16.  Tracheostomy tract/wound site bleeding - Noted on 06/12/16. ENT was consulted and indicated that the bleeding did not seem to be from the trachea but instead is from the tracheostomy tract/wound. This could be from chronic irritation from the tube. Tracheostomy was changed to a new [place] adjustable length tube  -  no overt bleeding  noted for the last 2  days.  Acute blood loss anemia complicating chronic anemia.  - Baseline hemoglobin apparently around 8 g per DL. Patient received 2 units of PRBC on 06/06/16. Hemoglobin had stabilized at 10 g per DL. He developed tracheostomy site bleeding and hemoglobin has gradually dropped to 7.9 on 10/4 - Follow CBCs closely and transfuse if hemoglobin less than 7 g per DL. As per discussion with palliative care team, continued aggressive level of care per family.  - Hemoglobin has remained stable in the low 7 g per DL range. Follow CBC in a.m.  Acute on Chronic ventilatory dependent respiratory failure-on tracheostomy 4 years -  continue current when settings without plans for weaning.  - CCM evaluated 10/3 and signed off Mucinex.   Diarrhea;  Still with diarrhea. Has rectal tube. C diff negative 9-25 Added  florastore. Continues to have diarrhea. Add Imodium.  Mild troponin elevation - Due to demand ischemic from septic shock - Troponin 0.09 -->0.07  - No further work up needed at this time  -ECHO no wall motion abnormalities.   Acute encephalopathy/Chronic Encephalopathy secondary to TBI as a child - Due to septic shock - able to move head to answer yes and no.  -  apparently mental status at baseline.   Hypernatremia;  Resolved.  Hyperkalemia / Hypokalemia  - resolved  Hypomagnesemia - resolved  Thrombocytopenia;  - related to infection. Slowly improving.   Chronic diastolic CHF -Echocardiogram normal Ef  - clinically with anasarca. Intermittent hypotension limiting diuretic use. - Blood pressures are better today and SBP ranging in the 110s. Trial of Lasix 20 mg IV 1.   Chronic/paroxysmal atrial fibrillation(CHADS vasc score 2) - On amiodarone and aspirin  - Sinus rhythm on monitor.   Dysphagia, PEG tube-dependent / Severe protein calorie malnutrition  - Chronic, stable  - Continue tube feeds  - Family requesting PEG tube to be changed-IR consulted and input  appreciated. PEG tube does not seem to be infected and recommended continuing same PEG tube without changing for now.  Quadriplegia - Stable . Has significant contractures.   Neurogenic bladder - Chronic, stable - Has Foley catheter in place  Seizure disorder - Continue keppra, vimpat  Stage 4 sacral pressure ulcer, stage 3 right and left hip sacral ulcer - Appreciate wound care assessment - Stage 4Sacrum 10cm x 8cm x 0.2cm with 100% red gran tissue, mod amtdrainage green, mild odor - Healing stage 3 righthip 2.5cm x 2 cm 100 % red granulating, no drainage - Left hip Stage 3 ulcer 4.5cm x 7cm x 1cm, 100% red granulating, modamt green drainage, mild odor  -Left great toe full thickness 0.8cm x 0.8cm 100% red gran tissue, no drainage or odor - L flank MARSI 0.5cm x 0.3cm pink and dry - L knee full thickness 2.5cm x 2cm red and moistno drainage or odor noted - L heel Stage 2 0.3cm x 0.3cm pink and moist no drainage or odor - Per WOC, apply quacell for sacrum and left hip to absorb drainage and provide antimicrobial benifitsand allevyn foam to the others - Placed on a mattress and ensure on rotation at all times  Adult failure to thrive - Multifactorial secondary to TBI and multiple severe significant comorbidities. As per discussion with palliative care team on 10/4, even though patient's mother seems to be agreeable to comfort oriented care, patient's brother wishes continued aggressive care.  Anasarca - Multifactorial. Trial of IV Lasix 20 mg 1 on 10/5   DVT  prophylaxis: Heparin-was held on 10/3 secondary to tracheostomy site bleeding. Unable to use SCD secondary to severe contractures. Code Status: DNR Family Communication: None at bedside. Discussed with brother Dean Graham via phone, updated care and answered questions. Outlined overall poor long term prognosis. Disposition Plan: To be determined. Possible LTAC DC in the next 1-2 days pending bed availability and continued  stability in the hospital.   Consultants:   Infectious disease  CCM  ENT  Procedures:   R midline PICC 9/19>  Trach> changed by ENT 10/3  Foley catheter  Rectal tube  PEG  Antimicrobials:  Meropenem 9/20 >> Linezolid 9/19 >>9/25 Aztreonam 9/19 >> 9/19 Levaquin 9/19 >>9/19   Subjective: Nonverbal but patient able to communicate to some questions by nodding his head. As per RN, continued watery diarrhea in rectal tube >550 mL charted for the last 24 hours.  Objective:  Vitals:   06/14/16 0500 06/14/16 0750 06/14/16 0753 06/14/16 0800  BP: 101/75 115/85  (!) 110/95  Pulse: (!) 105 (!) 109  (!) 106  Resp: 17 14  (!) 32  Temp:   97.3 F (36.3 C)   TempSrc:   Oral   SpO2: 100% 100%  100%  Weight:      Height:      Temperature: 98.6F.   Intake/Output Summary (Last 24 hours) at 06/14/16 1123 Last data filed at 06/14/16 0800  Gross per 24 hour  Intake             1785 ml  Output             1600 ml  Net              185 ml   Filed Weights   06/12/16 0432 06/13/16 0414 06/14/16 0450  Weight: 75.8 kg (167 lb) 73.5 kg (162 lb) 74.8 kg (165 lb)    Examination:  General exam: Chronically ill-looking middle-aged male lying propped up in bed. Respiratory system: Reduced breath sounds in the bases. Harsh breath sounds elsewhere.Marland Kitchen Respiratory effort normal. Cardiovascular system: S1 & S2 heard, RRR. No JVD, murmurs, rubs, gallops or clicks. 1+  pedal edema.Telemetry: Sinus Rhythm-sinus tachycardia in the 100s. Gastrointestinal system: Abdomen is nondistended, soft and nontender. No organomegaly or masses felt. Normal bowel sounds heard.PEG tube +. Rectal tube with liquid green stools.  Central nervous system: Alert. Nonverbal. Responds to questions by head nods.  Extremities: Contractures of all 4 extremities with edema and multiple wounds on lower extremities  Skin: Multiple wounds as per wound care team.  Psychiatry: Cannot be assessed due to TBI.    Data  Reviewed: I have personally reviewed following labs and imaging studies  CBC:  Recent Labs Lab 06/11/16 0457 06/12/16 0506 06/12/16 1842 06/13/16 0400 06/13/16 1700 06/14/16 0500  WBC 13.7* 15.5*  --  16.3* 16.4* 18.0*  NEUTROABS 10.3* 12.2*  --  13.2* 13.0* 14.8*  HGB 10.0* 9.6* 8.6* 7.9* 7.1* 7.5*  HCT 31.0* 29.7* 27.3* 25.1* 21.9* 23.8*  MCV 93.9 94.0  --  96.5 96.1 97.9  PLT 108* 125*  --  135* 120* 536*   Basic Metabolic Panel:  Recent Labs Lab 06/09/16 0411 06/09/16 1410 06/10/16 0400 06/11/16 0457 06/12/16 0506 06/13/16 0400 06/14/16 0500  NA 142 140 144 141 138  --  141  K 5.6* 4.0 4.1 4.5 4.7  --  4.8  CL 112* 111 112* 108 106  --  105  CO2 '27 27 29 29 29  '$ --  29  GLUCOSE 106*  105* 99 107* 104*  --  97  BUN 32* 28* 28* 26* 26*  --  29*  CREATININE 0.39* 0.34* 0.35* 0.38* 0.34*  --  <0.30*  CALCIUM 8.0* 7.8* 8.0* 7.9* 7.7*  --  8.0*  MG 2.1  --  1.7 1.9 1.7 1.8  --    GFR: CrCl cannot be calculated (This lab value cannot be used to calculate CrCl because it is not a number: <0.30). Liver Function Tests: No results for input(s): AST, ALT, ALKPHOS, BILITOT, PROT, ALBUMIN in the last 168 hours. No results for input(s): LIPASE, AMYLASE in the last 168 hours. No results for input(s): AMMONIA in the last 168 hours. Coagulation Profile:  Recent Labs Lab 06/12/16 1831  INR 1.65   Cardiac Enzymes: No results for input(s): CKTOTAL, CKMB, CKMBINDEX, TROPONINI in the last 168 hours. BNP (last 3 results) No results for input(s): PROBNP in the last 8760 hours. HbA1C: No results for input(s): HGBA1C in the last 72 hours. CBG:  Recent Labs Lab 06/13/16 1647 06/13/16 1955 06/13/16 2332 06/14/16 0411 06/14/16 0747  GLUCAP 113* 97 104* 109* 111*   Lipid Profile: No results for input(s): CHOL, HDL, LDLCALC, TRIG, CHOLHDL, LDLDIRECT in the last 72 hours. Thyroid Function Tests: No results for input(s): TSH, T4TOTAL, FREET4, T3FREE, THYROIDAB in the last 72  hours. Anemia Panel: No results for input(s): VITAMINB12, FOLATE, FERRITIN, TIBC, IRON, RETICCTPCT in the last 72 hours.  Sepsis Labs: No results for input(s): PROCALCITON, LATICACIDVEN in the last 168 hours.  Recent Results (from the past 240 hour(s))  Culture, blood (routine x 2)     Status: None   Collection Time: 06/04/16  5:42 PM  Result Value Ref Range Status   Specimen Description BLOOD LEFT HAND  Final   Special Requests IN PEDIATRIC BOTTLE  3CC  Final   Culture NO GROWTH 5 DAYS  Final   Report Status 06/09/2016 FINAL  Final  Culture, blood (routine x 2)     Status: None   Collection Time: 06/04/16  5:50 PM  Result Value Ref Range Status   Specimen Description BLOOD LEFT HAND  Final   Special Requests BOTTLES DRAWN AEROBIC ONLY  5CC  Final   Culture NO GROWTH 5 DAYS  Final   Report Status 06/09/2016 FINAL  Final  C difficile quick scan w PCR reflex     Status: None   Collection Time: 06/04/16  6:30 PM  Result Value Ref Range Status   C Diff antigen NEGATIVE NEGATIVE Final   C Diff toxin NEGATIVE NEGATIVE Final   C Diff interpretation No C. difficile detected.  Final  Urine culture     Status: None   Collection Time: 06/04/16  8:33 PM  Result Value Ref Range Status   Specimen Description URINE, CATHETERIZED  Final   Special Requests NONE  Final   Culture NO GROWTH  Final   Report Status 06/06/2016 FINAL  Final  Culture, respiratory (NON-Expectorated)     Status: None (Preliminary result)   Collection Time: 06/04/16 11:28 PM  Result Value Ref Range Status   Specimen Description TRACHEAL ASPIRATE  Final   Special Requests NONE  Final   Gram Stain   Final    RARE SQUAMOUS EPITHELIAL CELLS PRESENT FEW WBC PRESENT,BOTH PMN AND MONONUCLEAR MODERATE GRAM NEGATIVE RODS    Culture   Final    ABUNDANT STENOTROPHOMONAS MALTOPHILIA ABUNDANT PSEUDOMONAS AERUGINOSA ABUNDANT ACINETOBACTER CALCOACETICUS/BAUMANNII COMPLEX Results Called to: J. LINDSAY RN, AT 1937 06/09/16 BY D.  Victoriano Lain  MULTI-DRUG RESISTANT ORGANISM Sent to [facility] for further susceptibility testing.    Report Status PENDING  Incomplete   Organism ID, Bacteria PSEUDOMONAS AERUGINOSA  Final   Organism ID, Bacteria STENOTROPHOMONAS MALTOPHILIA  Final   Organism ID, Bacteria ACINETOBACTER CALCOACETICUS/BAUMANNII COMPLEX  Final      Susceptibility   Acinetobacter calcoaceticus/baumannii complex - MIC*    CEFTAZIDIME >=64 RESISTANT Resistant     CEFTRIAXONE >=64 RESISTANT Resistant     CIPROFLOXACIN >=4 RESISTANT Resistant     GENTAMICIN 8 INTERMEDIATE Intermediate     IMIPENEM >=16 RESISTANT Resistant     PIP/TAZO >=128 RESISTANT Resistant     TRIMETH/SULFA 80 RESISTANT Resistant     CEFEPIME >=64 RESISTANT Resistant     AMPICILLIN/SULBACTAM >=32 RESISTANT Resistant     * ABUNDANT ACINETOBACTER CALCOACETICUS/BAUMANNII COMPLEX   Pseudomonas aeruginosa - MIC*    CEFTAZIDIME 4 SENSITIVE Sensitive     CIPROFLOXACIN >=4 RESISTANT Resistant     GENTAMICIN >=16 RESISTANT Resistant     IMIPENEM >=16 RESISTANT Resistant     CEFEPIME 16 INTERMEDIATE Intermediate     * ABUNDANT PSEUDOMONAS AERUGINOSA   Stenotrophomonas maltophilia - MIC*    LEVOFLOXACIN 4 INTERMEDIATE Intermediate     * ABUNDANT STENOTROPHOMONAS MALTOPHILIA         Radiology Studies: Dg Chest Port 1 View  Result Date: 06/12/2016 CLINICAL DATA:  Bleeding around tracheostomy. EXAM: PORTABLE CHEST 1 VIEW COMPARISON:  06/10/2016 FINDINGS: Tracheostomy appears unchanged and unremarkable. Pleural effusions have enlarged bilaterally with volume loss in both lungs. Airspace barium on the right is unchanged. IMPRESSION: Enlarging effusions bilaterally with worsened volume loss in both lungs. No specific abnormal finding related to the tracheostomy itself. Electronically Signed   By: Nelson Chimes M.D.   On: 06/12/2016 12:01        Scheduled Meds: . amiodarone  100 mg Oral Daily  . chlorhexidine gluconate (MEDLINE KIT)  15 mL Mouth  Rinse BID  . famotidine  20 mg Per Tube BID  . free water  200 mL Per Tube Q8H  . lacosamide  100 mg Per Tube BID  . levETIRAcetam  500 mg Per Tube BID  . mouth rinse  15 mL Mouth Rinse QID  . midodrine  10 mg Oral TID WC  . saccharomyces boulardii  250 mg Oral BID   Continuous Infusions: . feeding supplement (VITAL AF 1.2 CAL) 1,000 mL (06/14/16 1020)     LOS: 9 days       Mylin Hirano, MD [facility] Pager [phone]  If 7PM-7AM, please contact night-coverage [email or web address] Password [email or web address] 06/14/2016, 11:23 AM

## 2016-06-15 DIAGNOSIS — A419 Sepsis, unspecified organism: Secondary | ICD-10-CM | POA: Diagnosis not present

## 2016-06-15 DIAGNOSIS — R601 Generalized edema: Secondary | ICD-10-CM | POA: Diagnosis not present

## 2016-06-15 DIAGNOSIS — Z515 Encounter for palliative care: Secondary | ICD-10-CM | POA: Diagnosis not present

## 2016-06-15 DIAGNOSIS — R6521 Severe sepsis with septic shock: Secondary | ICD-10-CM | POA: Diagnosis not present

## 2016-06-15 DIAGNOSIS — R509 Fever, unspecified: Secondary | ICD-10-CM | POA: Diagnosis not present

## 2016-06-15 DIAGNOSIS — J9621 Acute and chronic respiratory failure with hypoxia: Secondary | ICD-10-CM | POA: Diagnosis not present

## 2016-06-15 LAB — CBC
HEMATOCRIT: 24.3 % — AB (ref 39.0–52.0)
HEMOGLOBIN: 7.7 g/dL — AB (ref 13.0–17.0)
MCH: 31.2 pg (ref 26.0–34.0)
MCHC: 31.7 g/dL (ref 30.0–36.0)
MCV: 98.4 fL (ref 78.0–100.0)
Platelets: 192 10*3/uL (ref 150–400)
RBC: 2.47 MIL/uL — AB (ref 4.22–5.81)
RDW: 18.5 % — ABNORMAL HIGH (ref 11.5–15.5)
WBC: 20.2 10*3/uL — ABNORMAL HIGH (ref 4.0–10.5)

## 2016-06-15 LAB — BASIC METABOLIC PANEL
ANION GAP: 2 — AB (ref 5–15)
BUN: 23 mg/dL — ABNORMAL HIGH (ref 6–20)
CHLORIDE: 105 mmol/L (ref 101–111)
CO2: 31 mmol/L (ref 22–32)
Calcium: 8 mg/dL — ABNORMAL LOW (ref 8.9–10.3)
Glucose, Bld: 111 mg/dL — ABNORMAL HIGH (ref 65–99)
POTASSIUM: 4.2 mmol/L (ref 3.5–5.1)
Sodium: 138 mmol/L (ref 135–145)

## 2016-06-15 LAB — GLUCOSE, CAPILLARY
GLUCOSE-CAPILLARY: 112 mg/dL — AB (ref 65–99)
GLUCOSE-CAPILLARY: 89 mg/dL (ref 65–99)
Glucose-Capillary: 119 mg/dL — ABNORMAL HIGH (ref 65–99)
Glucose-Capillary: 91 mg/dL (ref 65–99)
Glucose-Capillary: 99 mg/dL (ref 65–99)
Glucose-Capillary: 117 mg/dL — ABNORMAL HIGH (ref 65–99)
Glucose-Capillary: 116 mg/dL — ABNORMAL HIGH (ref 65–99)

## 2016-06-15 LAB — SUSCEPTIBILITY RESULT

## 2016-06-15 LAB — SUSCEPTIBILITY, AER + ANAEROB

## 2016-06-15 MED ORDER — LOPERAMIDE HCL 1 MG/5ML PO LIQD
1.0000 mg | Freq: Two times a day (BID) | ORAL | Status: DC | PRN
  Filled 2016-06-15: qty 5

## 2016-06-15 MED ORDER — FUROSEMIDE 40 MG PO TABS
20.0000 mg | ORAL_TABLET | Freq: Every day | ORAL | Status: DC
  Administered 2016-06-15 – 2016-06-16 (×2): 20 mg
  Filled 2016-06-15 (×2): qty 1
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
## 2016-06-15 NOTE — Progress Notes (Signed)
Performed skin and trach care on patient. Cleansed and dried groin, g/j-peg site, buttocks and potential areas for skin compromise. Applied antifungal powder and will reassess 06/16/16.

## 2016-06-15 NOTE — Progress Notes (Signed)
No vent snf availability today.  Osborne Casco Jasen Hartstein LCSWA [phone]
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
## 2016-06-16 DIAGNOSIS — R601 Generalized edema: Secondary | ICD-10-CM | POA: Diagnosis not present

## 2016-06-16 DIAGNOSIS — I480 Paroxysmal atrial fibrillation: Secondary | ICD-10-CM | POA: Diagnosis not present

## 2016-06-16 DIAGNOSIS — R509 Fever, unspecified: Secondary | ICD-10-CM | POA: Diagnosis not present

## 2016-06-16 DIAGNOSIS — F0781 Postconcussional syndrome: Secondary | ICD-10-CM | POA: Diagnosis not present

## 2016-06-16 DIAGNOSIS — A419 Sepsis, unspecified organism: Secondary | ICD-10-CM | POA: Diagnosis not present

## 2016-06-16 DIAGNOSIS — J9621 Acute and chronic respiratory failure with hypoxia: Secondary | ICD-10-CM | POA: Diagnosis not present

## 2016-06-16 LAB — GLUCOSE, CAPILLARY
GLUCOSE-CAPILLARY: 114 mg/dL — AB (ref 65–99)
GLUCOSE-CAPILLARY: 108 mg/dL — AB (ref 65–99)
GLUCOSE-CAPILLARY: 116 mg/dL — AB (ref 65–99)
GLUCOSE-CAPILLARY: 117 mg/dL — AB (ref 65–99)
Glucose-Capillary: 107 mg/dL — ABNORMAL HIGH (ref 65–99)
Glucose-Capillary: 147 mg/dL — ABNORMAL HIGH (ref 65–99)

## 2016-06-16 LAB — SUSCEPTIBILITY RESULT

## 2016-06-16 MED ORDER — SODIUM CHLORIDE 0.9% FLUSH: 10.0000 mL | INTRAVENOUS | Status: DC | PRN

## 2016-06-16 MED ORDER — HEPARIN SODIUM (PORCINE) 5000 UNIT/ML IJ SOLN
5000.0000 [IU] | Freq: Three times a day (TID) | INTRAMUSCULAR | Status: DC
  Administered 2016-06-16 – 2016-06-18 (×8): 5000 [IU] via SUBCUTANEOUS
  Filled 2016-06-16 (×7): qty 1

## 2016-06-16 MED ORDER — FUROSEMIDE 40 MG PO TABS
40.0000 mg | ORAL_TABLET | Freq: Every day | ORAL | Status: DC
  Administered 2016-06-17 – 2016-06-18 (×2): 40 mg
  Filled 2016-06-16 (×2): qty 1

## 2016-06-16 MED ORDER — SODIUM CHLORIDE 0.9% FLUSH
10.0000 mL | INTRAVENOUS | Status: DC | PRN
  Administered 2016-06-18: 10 mL
  Filled 2016-06-16: qty 40

## 2016-06-16 NOTE — Progress Notes (Signed)
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 In ED, pt was febrile with T max of 100.7 F, hypotensive with BP of 72/60 without improvement after 4L IV fluids. He had WBC 23K. Chest x-ray showed bilateral pleural effusions with pneumonia versus atelectasis. Patient was admitted to ICU for possible need for pressor support. Patient was transferred to stepdown unit 05/30/2016. Off antibiotics. Grew pseudomonas from trach aspirate pan resistant, ID think this is colonization. Trying to find sensitivity to Acinetobacter to colistin, amikacin, tygacil zerbexa and the Stenotrophomonas to Bactrim (shoudl have been reported) In case these organisms later become pathogens. See ID note.    Assessment & Plan:   Principal Problem:   Septic shock ([place]) Active Problems:   HCAP (healthcare-associated pneumonia)   Pressure injury of skin   Quadriplegia (HCC)   Atrial fibrillation (HCC)   Diabetes mellitus without complication ([place])   Chronic respiratory failure (HCC)   Pressure ulcer stage IV   Encounter for wound care   Protein-calorie malnutrition, severe ([place])   Palliative care encounter   Goals of care, counseling/discussion   Chronic post traumatic encephalopathy   Acute encephalopathy   Hypokalemia   Hypomagnesemia   Urinary tract infection, site not specified   Acute on chronic respiratory failure with hypoxia (HCC)   Dysphagia   Decubitus ulcer of sacral area   Cough with frothy sputum   VAP (ventilator-associated pneumonia) (HCC)   Carbapenem-resistant  Acinetobacter baumannii infection   History of infection by MDR Stenotrophomonas maltophilia   History of MDR Pseudomonas aeruginosa infection   Hypoalbuminemia   Tracheostomy care ([place])   Bleeding   Anasarca   Septic shock/HCAP/ UTI Ventilator Associated PNA - Pt with chronic indwelling Foley catheter  - Tracheal aspirate 06/04/16: Showed abundant stenotrophomonas maltophilia, Pseudomonas aeruginosa and abundant Acinetobacter calcoaceticus/baumannii complex. Urine culture 9/25: Negative.Blood cultures 29/25/17: Negative. C. difficile negative - patient remains intermittently hypotensive and mildly tachycardic which may be more related to his acute blood loss anemia than sepsis. Still has mild leukocytosis.  - Chest x-ray on admission showed bilateral pleural effusions with pneumonia vs atelectasis - has recived  Albumin 50 g 2 - Continue midodrine 10 mg TID - [place] negative for abscess.  - Infectious disease continue to see patient. As per their follow-up 06/12/16, his multi-drug-resistant organisms are finally attributed as contaminants at this point and Dr. Drucilla Schmidt has stopped his antibiotics since he has completed full course of antimicrobials for VAP - ID are trying to find susceptibilities of the Acinetobacter to colistin, amikacin, tygacil zerbexa and the Stenotrophomonas to Bactrim (shoudl have been reported)  SHOULD these organisms later become pathogens. Per ID, BEFORE deciding that they are pathogens pts clinical picture would need to change and would want CCM to perform bronchoscopy for deep cultures to guide therapy - ID signed off 06/12/16.  Tracheostomy tract/wound site bleeding - Noted on 06/12/16. ENT was consulted and indicated that the bleeding did not seem to be from the trachea but instead is from the tracheostomy tract/wound. This could be from chronic irritation from the tube. Tracheostomy was changed to a new [place] adjustable length tube  -  no overt bleeding since  06/13/16  Acute blood loss anemia complicating chronic anemia.  - Baseline hemoglobin apparently around 8 g per DL. Patient received 2 units of PRBC on 06/06/16. Hemoglobin had stabilized at 10 g per DL. He developed tracheostomy site bleeding and hemoglobin has gradually dropped to 7.9 on 10/4 - Follow CBCs closely and transfuse if hemoglobin less than 7 g per DL. As per discussion with palliative care team, continued aggressive level of care per family.  - Hemoglobin has remained stable in the low 7 g per DL range. Follow CBC's periodically.  Acute on Chronic ventilatory dependent respiratory failure-on tracheostomy 4 years -  continue current when settings without plans for weaning.  - CCM evaluated 10/3 and signed off Mucinex.   Diarrhea;  Still with diarrhea. Has rectal tube. C diff negative 9-25 Added  florastore. Continued to have diarrhea. Tried Imodium on 10/5 with good effect. Change Imodium to when necessary. Improved. Per nursing, Rectal tube keeps falling off.  Mild troponin elevation - Due to demand ischemic from septic shock - Troponin 0.09 -->0.07  - No further work up needed at this time  -ECHO no wall motion abnormalities.   Acute encephalopathy/Chronic Encephalopathy secondary to TBI as a child - Due to septic shock - able to move head to answer yes and no.  -  apparently mental status at baseline.   Hypernatremia;  Resolved.  Hyperkalemia / Hypokalemia  - resolved  Hypomagnesemia - resolved  Thrombocytopenia;  - related to infection. Resolved.  Chronic diastolic CHF -Echocardiogram normal Ef  - clinically with anasarca. Intermittent hypotension limiting diuretic use. - Blood pressures are better today and SBP ranging in the 110s. Trial of Lasix 20 mg IV 1-improved edema and tolerated. Continue Lasix> increased to 40 mg daily.  Chronic/paroxysmal atrial fibrillation(CHADS vasc score 2) - On amiodarone and aspirin  - Sinus rhythm on monitor.    Dysphagia, PEG tube-dependent / Severe protein calorie malnutrition  - Chronic, stable  - Continue tube feeds  - Family requesting PEG tube to be changed-IR consulted and input appreciated. PEG tube does not seem to be infected and recommended continuing same PEG tube without changing for now.  Quadriplegia - Stable . Has significant contractures.   Neurogenic bladder - Chronic, stable - Has Foley catheter in place  Seizure disorder - Continue keppra, vimpat  Stage 4 sacral pressure ulcer, stage 3 right and left hip sacral ulcer - Appreciate wound care assessment - Stage 4Sacrum 10cm x 8cm x 0.2cm with 100% red gran tissue, mod amtdrainage green, mild odor - Healing stage 3 righthip 2.5cm x 2 cm 100 % red granulating, no drainage - Left hip Stage 3 ulcer 4.5cm x 7cm x 1cm, 100% red granulating, modamt green drainage, mild odor  -Left great toe full thickness 0.8cm x 0.8cm 100% red gran tissue, no drainage or odor - L flank MARSI 0.5cm x 0.3cm pink and dry - L knee full thickness 2.5cm x 2cm red and moistno drainage or odor noted - L heel Stage 2 0.3cm x 0.3cm pink and moist no drainage or odor - Per WOC, apply quacell for sacrum and left hip to absorb drainage and provide antimicrobial benifitsand allevyn foam to the others - Placed on a mattress and ensure on rotation at all times  Adult failure to thrive - Multifactorial secondary to TBI and multiple severe significant comorbidities. As per discussion with palliative care team on 10/4, even though patient's mother seems to be agreeable to comfort oriented  care, patient's brother wishes continued aggressive care.  Anasarca - Multifactorial. Tolerated dose of Lasix 10/5. Continue Lasix.   DVT prophylaxis: Heparin-was held on 10/3 secondary to tracheostomy site bleeding. Unable to use SCD secondary to severe contractures. Now that he has no bleeding, resumed Heparin DVT prophylaxis. Code Status: DNR Family  Communication: None at bedside. Discussed with brother [name] via phone on 10/5, updated care and answered questions. Outlined overall poor long term prognosis. Disposition Plan: To be determined. Possible SNF pending bed availability. CSW consulted re placement. Has exhausted LTAC days.   Consultants:   Infectious disease  CCM  ENT  Procedures:   R midline PICC 9/19>  Trach> changed by ENT 10/3  Foley catheter  Rectal tube  PEG  Antimicrobials:  Meropenem 9/20 >> Linezolid 9/19 >>9/25 Aztreonam 9/19 >> 9/19 Levaquin 9/19 >>9/19   Subjective: Nonverbal. As per RN, not much stools. Rectal tube has fallen 3 times in the last 24 hours.   Objective:  Vitals:   06/16/16 1000 06/16/16 1159 06/16/16 1200 06/16/16 1233  BP: (!) 123/93 110/82 106/81 106/80  Pulse: (!) 103 (!) 104 (!) 103 (!) 103  Resp: '11 15 16 16  '$ Temp:    98.2 F (36.8 C)  TempSrc:    Axillary  SpO2: 100% 100% 100% 100%  Weight:      Height:        Intake/Output Summary (Last 24 hours) at 06/16/16 1305 Last data filed at 06/16/16 0900  Gross per 24 hour  Intake             2400 ml  Output             1250 ml  Net             1150 ml   Filed Weights   06/14/16 0450 06/15/16 0400 06/16/16 0355  Weight: 74.8 kg (165 lb) 75.3 kg (166 lb) 72.6 kg (160 lb)    Examination:  General exam: Chronically ill-looking middle-aged male lying propped up in bed.Decreased conjunctival edema and facial puffiness. Respiratory system: Reduced breath sounds in the bases. Harsh breath sounds elsewhere.Marland Kitchen Respiratory effort normal. Cardiovascular system: S1 & S2 heard, RRR. No JVD, murmurs, rubs, gallops or clicks. 1+  pedal edema.Telemetry: sinus tachycardia in the 100s. Gastrointestinal system: Abdomen is nondistended, soft and nontender. No organomegaly or masses felt. Normal bowel sounds heard.PEG tube +. Rectal tube with liquid green stools.  Central nervous system: Alert. Nonverbal. Responds to questions by  head nods.  Extremities: Contractures of all 4 extremities with edema and multiple wounds on lower extremities  Skin: Multiple wounds as per wound care team.  Psychiatry: Cannot be assessed due to TBI.    Data Reviewed: I have personally reviewed following labs and imaging studies  CBC:  Recent Labs Lab 06/11/16 0457 06/12/16 0506 06/12/16 1842 06/13/16 0400 06/13/16 1700 06/14/16 0500 06/15/16 1450  WBC 13.7* 15.5*  --  16.3* 16.4* 18.0* 20.2*  NEUTROABS 10.3* 12.2*  --  13.2* 13.0* 14.8*  --   HGB 10.0* 9.6* 8.6* 7.9* 7.1* 7.5* 7.7*  HCT 31.0* 29.7* 27.3* 25.1* 21.9* 23.8* 24.3*  MCV 93.9 94.0  --  96.5 96.1 97.9 98.4  PLT 108* 125*  --  135* 120* 144* 588   Basic Metabolic Panel:  Recent Labs Lab 06/10/16 0400 06/11/16 0457 06/12/16 0506 06/13/16 0400 06/14/16 0500 06/15/16 1450  NA 144 141 138  --  141 138  K 4.1 4.5 4.7  --  4.8 4.2  CL 112* 108 106  --  105 105  CO2 '29 29 29  '$ --  29 31  GLUCOSE 99 107* 104*  --  97 111*  BUN 28* 26* 26*  --  29* 23*  CREATININE 0.35* 0.38* 0.34*  --  <0.30* <0.30*  CALCIUM 8.0* 7.9* 7.7*  --  8.0* 8.0*  MG 1.7 1.9 1.7 1.8  --   --    GFR: CrCl cannot be calculated (This lab value cannot be used to calculate CrCl because it is not a number: <0.30). Liver Function Tests: No results for input(s): AST, ALT, ALKPHOS, BILITOT, PROT, ALBUMIN in the last 168 hours. No results for input(s): LIPASE, AMYLASE in the last 168 hours. No results for input(s): AMMONIA in the last 168 hours. Coagulation Profile:  Recent Labs Lab 06/12/16 1831  INR 1.65   Cardiac Enzymes: No results for input(s): CKTOTAL, CKMB, CKMBINDEX, TROPONINI in the last 168 hours. BNP (last 3 results) No results for input(s): PROBNP in the last 8760 hours. HbA1C: No results for input(s): HGBA1C in the last 72 hours. CBG:  Recent Labs Lab 06/15/16 2015 06/15/16 2329 06/16/16 0353 06/16/16 0822 06/16/16 1140  GLUCAP 89 116* 114* 108* 107*   Lipid  Profile: No results for input(s): CHOL, HDL, LDLCALC, TRIG, CHOLHDL, LDLDIRECT in the last 72 hours. Thyroid Function Tests: No results for input(s): TSH, T4TOTAL, FREET4, T3FREE, THYROIDAB in the last 72 hours. Anemia Panel: No results for input(s): VITAMINB12, FOLATE, FERRITIN, TIBC, IRON, RETICCTPCT in the last 72 hours.  Sepsis Labs: No results for input(s): PROCALCITON, LATICACIDVEN in the last 168 hours.  No results found for this or any previous visit (from the past 240 hour(s)).       Radiology Studies: No results found.      Scheduled Meds: . amiodarone  100 mg Oral Daily  . chlorhexidine gluconate (MEDLINE KIT)  15 mL Mouth Rinse BID  . famotidine  20 mg Per Tube BID  . free water  200 mL Per Tube Q8H  . [START ON 06/17/2016] furosemide  40 mg Per Tube Daily  . lacosamide  100 mg Per Tube BID  . levETIRAcetam  500 mg Per Tube BID  . mouth rinse  15 mL Mouth Rinse QID  . midodrine  10 mg Oral TID WC  . saccharomyces boulardii  250 mg Oral BID   Continuous Infusions: . feeding supplement (VITAL AF 1.2 CAL) 1,000 mL (06/16/16 1216)     LOS: 18 days       Tonna Palazzi, MD [facility] Pager [phone]  If 7PM-7AM, please contact night-coverage [email or web address] Password [email or web address] 06/16/2016, 1:05 PM

## 2016-06-16 NOTE — Progress Notes (Signed)
CSW sent referrals for SNF facilities via the Hub. CSW will monitor pt's chart for bed offers.  Jonathon Jordan, MSW, LCSWA Weekend Coverage [phone]

## 2016-06-17 DIAGNOSIS — A419 Sepsis, unspecified organism: Secondary | ICD-10-CM | POA: Diagnosis not present

## 2016-06-17 DIAGNOSIS — D72829 Elevated white blood cell count, unspecified: Secondary | ICD-10-CM | POA: Diagnosis not present

## 2016-06-17 DIAGNOSIS — R509 Fever, unspecified: Secondary | ICD-10-CM | POA: Diagnosis not present

## 2016-06-17 DIAGNOSIS — R601 Generalized edema: Secondary | ICD-10-CM | POA: Diagnosis not present

## 2016-06-17 LAB — GLUCOSE, CAPILLARY
GLUCOSE-CAPILLARY: 126 mg/dL — AB (ref 65–99)
GLUCOSE-CAPILLARY: 99 mg/dL (ref 65–99)
Glucose-Capillary: 109 mg/dL — ABNORMAL HIGH (ref 65–99)
Glucose-Capillary: 109 mg/dL — ABNORMAL HIGH (ref 65–99)
Glucose-Capillary: 110 mg/dL — ABNORMAL HIGH (ref 65–99)

## 2016-06-17 NOTE — Progress Notes (Signed)
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 In ED, pt was febrile with T max of 100.7 F, hypotensive with BP of 72/60 without improvement after 4L IV fluids. He had WBC 23K. Chest x-ray showed bilateral pleural effusions with pneumonia versus atelectasis. Patient was admitted to ICU for possible need for pressor support. Patient was transferred to stepdown unit 05/30/2016. Off antibiotics. Grew pseudomonas from trach aspirate pan resistant, ID think this is colonization. Trying to find sensitivity to Acinetobacter to colistin, amikacin, tygacil zerbexa and the Stenotrophomonas to Bactrim (shoudl have been reported) In case these organisms later become pathogens. See ID note. Awaiting SNF bed for DC.   Assessment & Plan:   Principal Problem:   Septic shock (HCC) Active Problems:   HCAP (healthcare-associated pneumonia)   Pressure injury of skin   Quadriplegia (HCC)   Atrial fibrillation (HCC)   Diabetes mellitus without complication (HCC)   Chronic respiratory failure (HCC)   Pressure ulcer stage IV   Encounter for wound care   Protein-calorie malnutrition, severe (HCC)   Palliative care encounter   Goals of care, counseling/discussion   Chronic post traumatic encephalopathy   Acute encephalopathy   Hypokalemia   Hypomagnesemia   Urinary tract infection, site not specified   Acute on chronic respiratory failure with hypoxia (HCC)   Dysphagia   Decubitus ulcer of sacral area   Cough with frothy sputum   VAP (ventilator-associated pneumonia) (HCC)  Carbapenem-resistant Acinetobacter baumannii infection   History of infection by MDR Stenotrophomonas maltophilia   History of MDR Pseudomonas aeruginosa infection   Hypoalbuminemia   Tracheostomy care (HCC)   Bleeding   Anasarca   Septic shock/HCAP/ UTI Ventilator Associated PNA - Pt with chronic indwelling Foley catheter  - Tracheal aspirate 06/04/16: Showed abundant stenotrophomonas maltophilia, Pseudomonas aeruginosa and abundant Acinetobacter calcoaceticus/baumannii complex. Urine culture 9/25: Negative.Blood cultures 29/25/17: Negative. C. difficile negative - patient remains intermittently hypotensive and mildly tachycardic which may be more related to his acute blood loss anemia than sepsis. Still has mild leukocytosis.  - Chest x-ray on admission showed bilateral pleural effusions with pneumonia vs atelectasis - has recived  Albumin 50 g 2 - Continue midodrine 10 mg TID - US negative for abscess.  - Infectious disease continue to see patient. As per their follow-up 06/12/16, his multi-drug-resistant organisms are finally attributed as contaminants at this point and Dr. Vandam has stopped his antibiotics since he has completed full course of antimicrobials for VAP - ID are trying to find susceptibilities of the Acinetobacter to colistin, amikacin, tygacil zerbexa and the Stenotrophomonas to Bactrim (shoudl have been reported)  SHOULD these organisms later become pathogens. Per ID, BEFORE deciding that they are pathogens pts clinical picture would need to change and would want CCM to perform bronchoscopy for deep cultures to guide therapy - ID signed off 06/12/16.  Tracheostomy tract/wound site bleeding - Noted on 06/12/16. ENT was consulted and indicated that the bleeding did not seem to be from the trachea but instead is from the tracheostomy tract/wound. This could be from chronic irritation from the tube. Tracheostomy was changed to a new Bivona adjustable length tube  -    no overt  bleeding since 06/13/16  Acute blood loss anemia complicating chronic anemia.  - Baseline hemoglobin apparently around 8 g per DL. Patient received 2 units of PRBC on 06/06/16. Hemoglobin had stabilized at 10 g per DL. He developed tracheostomy site bleeding and hemoglobin has gradually dropped to 7.9 on 10/4 - Follow CBCs closely and transfuse if hemoglobin less than 7 g per DL. As per discussion with palliative care team, continued aggressive level of care per family.  - Hemoglobin has remained stable in the low 7 g per DL range. Follow CBC's periodically.  Acute on Chronic ventilatory dependent respiratory failure-on tracheostomy 4 years -  continue current when settings without plans for weaning.  - CCM evaluated 10/3 and signed off Mucinex.   Diarrhea;  Still with diarrhea. Has rectal tube. C diff negative 9-25 Added  florastore. Continued to have diarrhea. Tried Imodium on 10/5 with good effect. Change Imodium to when necessary. Improved. Per nursing, Rectal tube keeps falling off.  Mild troponin elevation - Due to demand ischemic from septic shock - Troponin 0.09 -->0.07  - No further work up needed at this time  -ECHO no wall motion abnormalities.   Acute encephalopathy/Chronic Encephalopathy secondary to TBI as a child - Due to septic shock - able to move head to answer yes and no.  -  apparently mental status at baseline.   Hypernatremia;  Resolved.  Hyperkalemia / Hypokalemia  - resolved  Hypomagnesemia - resolved  Thrombocytopenia;  - related to infection. Resolved.  Chronic diastolic CHF -Echocardiogram normal Ef  - clinically with anasarca. Intermittent hypotension limiting diuretic use. - Blood pressures are better today and SBP ranging in the 110s. Trial of Lasix 20 mg IV 1-improved edema and tolerated. Continue Lasix> increased to 40 mg daily.  Chronic/paroxysmal atrial fibrillation(CHADS vasc score 2) - On amiodarone and aspirin  - Sinus  rhythm on monitor.   Dysphagia, PEG tube-dependent / Severe protein calorie malnutrition  - Chronic, stable  - Continue tube feeds  - Family requesting PEG tube to be changed-IR consulted and input appreciated. PEG tube does not seem to be infected and recommended continuing same PEG tube without changing for now.  Quadriplegia - Stable . Has significant contractures.   Neurogenic bladder - Chronic, stable - Has Foley catheter in place  Seizure disorder - Continue keppra, vimpat  Stage 4 sacral pressure ulcer, stage 3 right and left hip sacral ulcer - Appreciate wound care assessment - Stage 4Sacrum 10cm x 8cm x 0.2cm with 100% red gran tissue, mod amtdrainage green, mild odor - Healing stage 3 righthip 2.5cm x 2 cm 100 % red granulating, no drainage - Left hip Stage 3 ulcer 4.5cm x 7cm x 1cm, 100% red granulating, modamt green drainage, mild odor  -Left great toe full thickness 0.8cm x 0.8cm 100% red gran tissue, no drainage or odor - L flank MARSI 0.5cm x 0.3cm pink and dry - L knee full thickness 2.5cm x 2cm red and moistno drainage or odor noted - L heel Stage 2 0.3cm x 0.3cm pink and moist no drainage or odor - Per WOC, apply quacell for sacrum and left hip to absorb drainage and provide antimicrobial benifitsand allevyn foam to the others - Placed on a mattress and ensure on rotation at all times  Adult failure to thrive - Multifactorial secondary to TBI and multiple severe significant comorbidities. As per discussion with palliative care team on 10/4, even though patient's mother seems to be agreeable to comfort oriented  care, patient's brother wishes continued aggressive care.  Anasarca - Multifactorial. Tolerated dose of Lasix 10/5. Continue Lasix> tolerating. Daily Weight.  Leukocytosis - gradually creeping up is concerning. No fevers. Stable vitals. Potential for infection from many sources. For now trend CBC and monitor off of Abx.   DVT prophylaxis:  Heparin-was held on 10/3 secondary to tracheostomy site bleeding. Unable to use SCD secondary to severe contractures. Now that he has no bleeding, resumed Heparin DVT prophylaxis 10/7. Code Status: DNR Family Communication: None at bedside. Discussed with brother Dean Graham via phone on 10/5, updated care and answered questions. Outlined overall poor long term prognosis. Disposition Plan: To be determined. Possible SNF pending bed availability. CSW consulted re placement. Has exhausted LTAC days.   Consultants:   Infectious disease  CCM  ENT  Procedures:   R midline PICC 9/19>  Trach> changed by ENT 10/3  Foley catheter  Rectal tube  PEG  Antimicrobials:  Meropenem 9/20 >> Linezolid 9/19 >>9/25 Aztreonam 9/19 >> 9/19 Levaquin 9/19 >>9/19   Subjective: Nonverbal but mouths "I'm fine".    Objective:  Vitals:   06/16/16 2358 06/17/16 0402 06/17/16 0700 06/17/16 0815  BP:  104/83 101/77   Pulse: (!) 109 (!) 109 (!) 111   Resp: 14 19 16   Temp:  97.9 F (36.6 C) 98.6 F (37 C)   TempSrc:  Axillary Oral   SpO2: 100% 100% 100% 100%  Weight:  70.3 kg (155 lb)    Height:        Intake/Output Summary (Last 24 hours) at 06/17/16 1056 Last data filed at 06/17/16 0500  Gross per 24 hour  Intake          1813.33 ml  Output             1450 ml  Net           363.33 ml   Filed Weights   06/15/16 0400 06/16/16 0355 06/17/16 0402  Weight: 75.3 kg (166 lb) 72.6 kg (160 lb) 70.3 kg (155 lb)    Examination:  General exam: Chronically ill-looking middle-aged male lying propped up in bed.Decreased conjunctival edema and facial puffiness. Respiratory system: Reduced breath sounds in the bases. Harsh breath sounds elsewhere.. Respiratory effort normal. Cardiovascular system: S1 & S2 heard, RRR. No JVD, murmurs, rubs, gallops or clicks. 1+  pedal edema.Telemetry: sinus tachycardia in the 100s. Gastrointestinal system: Abdomen is nondistended, soft and nontender. No organomegaly  or masses felt. Normal bowel sounds heard.PEG tube +. Rectal tube with liquid green stools.  Central nervous system: Alert. Nonverbal. Responds to questions by head nods & mouths words at times..  Extremities: Contractures of all 4 extremities with edema and multiple wounds on lower extremities  Skin: Multiple wounds as per wound care team.  Psychiatry: Cannot be assessed due to TBI.    Data Reviewed: I have personally reviewed following labs and imaging studies  CBC:  Recent Labs Lab 06/11/16 0457 06/12/16 0506 06/12/16 1842 06/13/16 0400 06/13/16 1700 06/14/16 0500 06/15/16 1450  WBC 13.7* 15.5*  --  16.3* 16.4* 18.0* 20.2*  NEUTROABS 10.3* 12.2*  --  13.2* 13.0* 14.8*  --   HGB 10.0* 9.6* 8.6* 7.9* 7.1* 7.5* 7.7*  HCT 31.0* 29.7* 27.3* 25.1* 21.9* 23.8* 24.3*  MCV 93.9 94.0  --  96.5 96.1 97.9 98.4  PLT 108* 125*  --  135* 120* 144* 192   Basic Metabolic Panel:  Recent Labs Lab 06/11/16 0457 06/12/16 0506 06/13/16 0400 06/14/16 0500 06/15/16   1450  NA 141 138  --  141 138  K 4.5 4.7  --  4.8 4.2  CL 108 106  --  105 105  CO2 29 29  --  29 31  GLUCOSE 107* 104*  --  97 111*  BUN 26* 26*  --  29* 23*  CREATININE 0.38* 0.34*  --  <0.30* <0.30*  CALCIUM 7.9* 7.7*  --  8.0* 8.0*  MG 1.9 1.7 1.8  --   --    GFR: CrCl cannot be calculated (This lab value cannot be used to calculate CrCl because it is not a number: <0.30). Liver Function Tests: No results for input(s): AST, ALT, ALKPHOS, BILITOT, PROT, ALBUMIN in the last 168 hours. No results for input(s): LIPASE, AMYLASE in the last 168 hours. No results for input(s): AMMONIA in the last 168 hours. Coagulation Profile:  Recent Labs Lab 06/12/16 1831  INR 1.65   Cardiac Enzymes: No results for input(s): CKTOTAL, CKMB, CKMBINDEX, TROPONINI in the last 168 hours. BNP (last 3 results) No results for input(s): PROBNP in the last 8760 hours. HbA1C: No results for input(s): HGBA1C in the last 72  hours. CBG:  Recent Labs Lab 06/16/16 1551 06/16/16 1918 06/16/16 2329 06/17/16 0240 06/17/16 0748  GLUCAP 116* 117* 147* 126* 109*   Lipid Profile: No results for input(s): CHOL, HDL, LDLCALC, TRIG, CHOLHDL, LDLDIRECT in the last 72 hours. Thyroid Function Tests: No results for input(s): TSH, T4TOTAL, FREET4, T3FREE, THYROIDAB in the last 72 hours. Anemia Panel: No results for input(s): VITAMINB12, FOLATE, FERRITIN, TIBC, IRON, RETICCTPCT in the last 72 hours.  Sepsis Labs: No results for input(s): PROCALCITON, LATICACIDVEN in the last 168 hours.  No results found for this or any previous visit (from the past 240 hour(s)).       Radiology Studies: No results found.      Scheduled Meds: . amiodarone  100 mg Oral Daily  . chlorhexidine gluconate (MEDLINE KIT)  15 mL Mouth Rinse BID  . famotidine  20 mg Per Tube BID  . free water  200 mL Per Tube Q8H  . furosemide  40 mg Per Tube Daily  . heparin subcutaneous  5,000 Units Subcutaneous Q8H  . lacosamide  100 mg Per Tube BID  . levETIRAcetam  500 mg Per Tube BID  . mouth rinse  15 mL Mouth Rinse QID  . midodrine  10 mg Oral TID WC  . saccharomyces boulardii  250 mg Oral BID   Continuous Infusions: . feeding supplement (VITAL AF 1.2 CAL) 1,000 mL (06/17/16 0455)     LOS: 19 days       HONGALGI,ANAND, MD [facility] Pager [phone]  If 7PM-7AM, please contact night-coverage [email or web address] Password [email or web address] 06/17/2016, 10:56 AM   

## 2016-06-18 DIAGNOSIS — R509 Fever, unspecified: Secondary | ICD-10-CM | POA: Diagnosis not present

## 2016-06-18 DIAGNOSIS — A419 Sepsis, unspecified organism: Secondary | ICD-10-CM | POA: Diagnosis not present

## 2016-06-18 DIAGNOSIS — J9621 Acute and chronic respiratory failure with hypoxia: Secondary | ICD-10-CM | POA: Diagnosis not present

## 2016-06-18 DIAGNOSIS — I480 Paroxysmal atrial fibrillation: Secondary | ICD-10-CM | POA: Diagnosis not present

## 2016-06-18 DIAGNOSIS — R601 Generalized edema: Secondary | ICD-10-CM | POA: Diagnosis not present

## 2016-06-18 LAB — BASIC METABOLIC PANEL
Anion gap: 5 (ref 5–15)
BUN: 22 mg/dL — AB (ref 6–20)
CHLORIDE: 102 mmol/L (ref 101–111)
CO2: 31 mmol/L (ref 22–32)
Calcium: 7.7 mg/dL — ABNORMAL LOW (ref 8.9–10.3)
Creatinine, Ser: 0.33 mg/dL — ABNORMAL LOW (ref 0.61–1.24)
GFR calc Af Amer: >60 mL/min (ref >60)
GFR calc non Af Amer: >60 mL/min (ref >60)
Glucose, Bld: 103 mg/dL — ABNORMAL HIGH (ref 65–99)
POTASSIUM: 4.2 mmol/L (ref 3.5–5.1)
SODIUM: 138 mmol/L (ref 135–145)

## 2016-06-18 LAB — GLUCOSE, CAPILLARY
GLUCOSE-CAPILLARY: 105 mg/dL — AB (ref 65–99)
GLUCOSE-CAPILLARY: 105 mg/dL — AB (ref 65–99)
GLUCOSE-CAPILLARY: 108 mg/dL — AB (ref 65–99)
GLUCOSE-CAPILLARY: 108 mg/dL — AB (ref 65–99)
GLUCOSE-CAPILLARY: 115 mg/dL — AB (ref 65–99)
GLUCOSE-CAPILLARY: 128 mg/dL — AB (ref 65–99)
Glucose-Capillary: 103 mg/dL — ABNORMAL HIGH (ref 65–99)

## 2016-06-18 LAB — CBC WITH DIFFERENTIAL/PLATELET
BASOS ABS: 0 10*3/uL (ref 0.0–0.1)
BASOS PCT: 0 %
Eosinophils Absolute: 0.3 10*3/uL (ref 0.0–0.7)
Eosinophils Relative: 2 %
HCT: 22.5 % — ABNORMAL LOW (ref 39.0–52.0)
HEMOGLOBIN: 7.1 g/dL — AB (ref 13.0–17.0)
LYMPHS PCT: 15 %
Lymphs Abs: 2.4 10*3/uL (ref 0.7–4.0)
MCH: 31.7 pg (ref 26.0–34.0)
MCHC: 31.6 g/dL (ref 30.0–36.0)
MCV: 100.4 fL — AB (ref 78.0–100.0)
MONOS PCT: 10 %
Monocytes Absolute: 1.6 10*3/uL — ABNORMAL HIGH (ref 0.1–1.0)
NEUTROS ABS: 11.8 10*3/uL — AB (ref 1.7–7.7)
Neutrophils Relative %: 73 %
Platelets: 255 10*3/uL (ref 150–400)
RBC: 2.24 MIL/uL — ABNORMAL LOW (ref 4.22–5.81)
RDW: 20.5 % — ABNORMAL HIGH (ref 11.5–15.5)
WBC: 16.1 10*3/uL — ABNORMAL HIGH (ref 4.0–10.5)

## 2016-06-18 LAB — CBC
HCT: 26.1 % — ABNORMAL LOW (ref 39.0–52.0)
HEMOGLOBIN: 8.5 g/dL — AB (ref 13.0–17.0)
MCH: 31.5 pg (ref 26.0–34.0)
MCHC: 32.6 g/dL (ref 30.0–36.0)
MCV: 96.7 fL (ref 78.0–100.0)
PLATELETS: 248 10*3/uL (ref 150–400)
RBC: 2.7 MIL/uL — ABNORMAL LOW (ref 4.22–5.81)
RDW: 20.9 % — AB (ref 11.5–15.5)
WBC: 14.3 10*3/uL — ABNORMAL HIGH (ref 4.0–10.5)

## 2016-06-18 LAB — PREPARE RBC (CROSSMATCH)

## 2016-06-18 MED ORDER — VITAL AF 1.2 CAL PO LIQD: 1000.0000 mL | ORAL | Status: DC

## 2016-06-18 MED ORDER — ADULT MULTIVITAMIN W/MINERALS CH: 1.0000 | ORAL_TABLET | Freq: Every day | ORAL | Status: DC

## 2016-06-18 MED ORDER — MORPHINE SULFATE 2 MG/ML IJ SOLN: 0.5000 mg | INTRAMUSCULAR | 0 refills | Status: DC | PRN

## 2016-06-18 MED ORDER — LEVETIRACETAM 100 MG/ML PO SOLN: 500.0000 mg | Freq: Two times a day (BID) | ORAL | Status: DC

## 2016-06-18 MED ORDER — LACOSAMIDE 100 MG PO TABS: 100.0000 mg | ORAL_TABLET | Freq: Two times a day (BID) | ORAL | Status: DC

## 2016-06-18 MED ORDER — VITAMIN C 500 MG PO TABS: 500.0000 mg | ORAL_TABLET | Freq: Every day | ORAL | Status: DC

## 2016-06-18 MED ORDER — FAMOTIDINE 20 MG PO TABS: 20.0000 mg | ORAL_TABLET | Freq: Two times a day (BID) | ORAL | Status: DC

## 2016-06-18 MED ORDER — SODIUM CHLORIDE 0.9 % IV SOLN
Freq: Once | INTRAVENOUS | Status: AC
  Administered 2016-06-18: 10:00:00 via INTRAVENOUS

## 2016-06-18 MED ORDER — ACETAMINOPHEN 325 MG PO TABS: 650.0000 mg | ORAL_TABLET | Freq: Four times a day (QID) | ORAL | Status: DC | PRN

## 2016-06-18 MED ORDER — LOPERAMIDE HCL 1 MG/5ML PO LIQD: 1.0000 mg | Freq: Two times a day (BID) | ORAL | Status: DC | PRN

## 2016-06-18 MED ORDER — FREE WATER: 200.0000 mL | Freq: Three times a day (TID) | Status: DC

## 2016-06-18 MED ORDER — SACCHAROMYCES BOULARDII 250 MG PO CAPS: 250.0000 mg | ORAL_CAPSULE | Freq: Two times a day (BID) | ORAL | Status: DC

## 2016-06-18 NOTE — Clinical Social Work Placement (Signed)
   CLINICAL SOCIAL WORK PLACEMENT  NOTE  Date:  06/18/2016  Patient Details  Name: Dean Graham MRN: [id number] Date of Birth: [birth year]  Clinical Social Work is seeking post-discharge placement for this patient at the Skilled  Nursing Facility level of care (*CSW will initial, date and re-position this form in  chart as items are completed):  Yes   Patient/family provided with [place] Clinical Social Work Department's list of facilities offering this level of care within the geographic area requested by the patient (or if unable, by the patient's family).  Yes   Patient/family informed of their freedom to choose among providers that offer the needed level of care, that participate in Medicare, Medicaid or managed care program needed by the patient, have an available bed and are willing to accept the patient.  Yes   Patient/family informed of [facility]'s ownership interest in [facility] and [facility], as well as of the fact that they are under no obligation to receive care at these facilities.  PASRR submitted to EDS on       PASRR number received on       Existing PASRR number confirmed on 07/01/16     FL2 transmitted to all facilities in geographic area requested by pt/family on 07/01/16     FL2 transmitted to all facilities within larger geographic area on       Patient informed that his/her managed care company has contracts with or will negotiate with certain facilities, including the following:        Yes   Patient/family informed of bed offers received.  Patient chooses bed at [facility]     Physician recommends and patient chooses bed at      Patient to be transferred to [facility] on 06/18/16.  Patient to be transferred to facility by [facility]     Patient family notified on 06/18/16 of transfer.  Name of family member notified:  [name];Ester     PHYSICIAN       Additional  Comment:    _______________________________________________ Mearl Latin, LCSWA 06/18/2016, 5:09 PM

## 2016-06-18 NOTE — Discharge Summary (Addendum)
Physician Discharge Summary  Delray Altnthony Burchill RUE:[id number]:[id number] DOB: [birth year]  PCP: Hillary BowROWLEY, MCKAY, MD  Admit date: 05/29/2016 Discharge date: 06/18/2016  Admitted From: [facility] Disposition:  [facility]  Recommendations for Outpatient Follow-up:  1. MD at SNF on 06/19/16 with repeat labs (CBC & BMP). Lab work thereafter should be followed periodically. 2. Recommend palliative care consultation and follow-up at SNF.  Home Health: None Equipment/Devices: None    Discharge Condition: Improved and stable. At extremely high risk for deterioration.  CODE STATUS: DO NOT RESUSCITATE  Diet recommendation: Nothing by mouth. Ongoing tube feeds.  Discharge Diagnoses:  Principal Problem:   Septic shock (HCC) Active Problems:   HCAP (healthcare-associated pneumonia)   Pressure injury of skin   Quadriplegia (HCC)   Atrial fibrillation (HCC)   Diabetes mellitus without complication (HCC)   Chronic respiratory failure (HCC)   Pressure ulcer stage IV   Encounter for wound care   Protein-calorie malnutrition, severe (HCC)   Palliative care encounter   Goals of care, counseling/discussion   Chronic post traumatic encephalopathy   Acute encephalopathy   Hypokalemia   Hypomagnesemia   Urinary tract infection, site not specified   Acute on chronic respiratory failure with hypoxia (HCC)   Dysphagia   Decubitus ulcer of sacral area   Cough with frothy sputum   VAP (ventilator-associated pneumonia) (HCC)   Carbapenem-resistant Acinetobacter baumannii infection   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 In ED, pt was febrile with T max of 100.7 F, hypotensive with BP of 72/60 without improvement after 4L IV fluids. He had WBC 23K. Chest x-ray showed bilateral pleural effusions with pneumonia versus atelectasis. Patient was admitted to ICU for possible need for pressor support. Patient was transferred to stepdown unit 05/30/2016. Off antibiotics. Grew pseudomonas from trach aspirate pan resistant, ID think this is colonization. Trying to find sensitivity to Acinetobacter to colistin, amikacin, tygacil zerbexa and the Stenotrophomonas to Bactrim (shoudl have been reported) in case these organisms later become pathogens. Palliative care team and hospitalists MD have discussed in detail with patient's family including patient's mother and brother regarding his overall poor prognosis and high risk for recurrent decline. They're combined decision at this time is to continue with current aggressive care.   Assessment & Plan:   Septic shock/HCAP/ UTI Ventilator Associated PNA - Pt with chronic indwelling Foley catheter  - Tracheal aspirate 06/04/16: Showed abundant stenotrophomonas maltophilia, Pseudomonas aeruginosa and abundant Acinetobacter calcoaceticus/baumannii complex. Urine culture 9/25: Negative.Blood cultures 29/25/17: Negative. C. difficile negative - Chest x-ray on admission showed bilateral pleural effusions with pneumonia vs atelectasis - has recived Albumin 50 g 2 - Continue midodrine 10 mg TID - US negative for abscess.  - As per ID follow-up 06/12/16, his multi-drug-resistant organisms are finally attributed as contaminants at this point and Dr. Algis LimingVandam has stopped his antibiotics since he has completed full course of antimicrobials for VAP - ID are trying to find susceptibilities of the Acinetobacter to colistin, amikacin, tygacil zerbexa and the Stenotrophomonas  to Bactrim (shoudl have been reported) SHOULD these organisms later become pathogens.  Per ID, BEFORE deciding that they are pathogens pts clinical picture would need to change and would want CCM to perform bronchoscopy for deep cultures to guide therapy - ID signed off 06/12/16. Discussed with ID/Dr. Daiva Eves and does not recommend any further evaluation at this time.  Tracheostomy tract/wound site bleeding - Noted on 06/12/16. ENT was consulted and indicated that the bleeding did not seem to be from the trachea but instead is from the tracheostomy tract/wound. This could be from chronic irritation from the tube. Tracheostomy was changed to a new Bivona adjustable length tube  -  no overt bleeding since 06/13/16  Acute blood loss anemia complicating chronic anemia.  - Baseline hemoglobin apparently around 8 g per DL. Patient received 2 units of PRBC on 06/06/16. Hemoglobin had stabilized at 10 g per DL. He developed tracheostomy site bleeding and hemoglobin has gradually dropped to 7.9 on 10/4 - Follow CBCs closely and transfuse if hemoglobin less than 7 g per DL. As per discussion with palliative care team, continued aggressive level of care per family.  - Hemoglobin had remained stable in the low 7 g per DL range. Hemoglobin today dropped to 7.1. This is likely multifactorial from chronic disease and hospital blood draws. Discussed with patient's mother who consented to transfuse 1 unit of PRBC. Posttransfusion hemoglobin is up to 8.5.   Acute on Chronic ventilatory dependent respiratory failure-on tracheostomy 4 years -  continue current when settings without plans for weaning.  - CCM evaluated 10/3 and signed off  Diarrhea;  Still with diarrhea. Has rectal tube. C diff negative 9-25 Added florastore. Continued to have diarrhea. Tried Imodium on 10/5 with good effect. Change Imodium to when necessary. Improved.   Mild troponin elevation - Due to demand ischemic from septic shock -  Troponin 0.09 -->0.07  - No further work up needed at this time  -ECHO no wall motion abnormalities.   Acute encephalopathy/Chronic Encephalopathy secondary to TBI as a child - Due to septic shock - able to move head to answer yes and no.  -  apparently mental status at baseline.   Hypernatremia; Resolved.  Hyperkalemia / Hypokalemia  - resolved  Hypomagnesemia - resolved  Thrombocytopenia;  - related to infection. Resolved.  Chronic diastolic CHF -Echocardiogram normal Ef  - clinically with anasarca. Intermittent hypotension limiting diuretic use. - Blood pressures are better today and SBP ranging in the 110s. Trial of Lasix 20 mg IV 1-improved edema and tolerated. Continue Lasix> increased to 40 mg daily.  Chronic/paroxysmal atrial fibrillation(CHADS vasc score 2) - On amiodarone and aspirin  - Sinus rhythm on monitor.   Dysphagia, PEG tube-dependent / Severe protein calorie malnutrition  - Chronic, stable  - Continue tube feeds  - Family requesting PEG tube to be changed-IR consulted and input appreciated. PEG tube does not seem to be infected and recommended continuing same PEG tube without changing for now.  Quadriplegia - Stable . Has significant contractures.   Neurogenic bladder - Chronic, stable - Has Foley catheter in place  Seizure disorder - Continue keppra, vimpat  Stage 4 sacral pressure ulcer, stage 3 right and left hip sacral ulcer - Appreciate wound care assessment - Stage 4Sacrum 10cm x 8cm x 0.2cm with 100% red gran tissue, mod amtdrainage green, mild odor - Healing stage 3 righthip 2.5cm x 2 cm 100 % red granulating, no drainage - Left hip Stage 3 ulcer 4.5cm x 7cm x 1cm, 100% red granulating, modamt green drainage, mild odor  -Left great  toe full thickness 0.8cm x 0.8cm 100% red gran tissue, no drainage or odor - L flank MARSI 0.5cm x 0.3cm pink and dry - L knee full thickness 2.5cm x 2cm red and moistno drainage or odor  noted - L heel Stage 2 0.3cm x 0.3cm pink and moist no drainage or odor - Per WOC, apply quacell for sacrum and left hip to absorb drainage and provide antimicrobial benifitsand allevyn foam to the others - Placed on a mattress and ensure on rotation at all times  Adult failure to thrive - Multifactorial secondary to TBI and multiple severe significant comorbidities. As per discussion with palliative care team on 10/4, even though patient's mother seems to be agreeable to comfort oriented care, patient's brother wishes continued aggressive care.  Anasarca - Multifactorial. Tolerated dose of Lasix 10/5. Continue Lasix> tolerating. Daily Weight.  Leukocytosis - gradually creeping up is concerning. No fevers. Stable vitals. Potential for infection from many sources. For now trend CBC and monitor off of Abx.   Consultants:   Infectious disease  CCM  ENT  Procedures:   R midline PICC 9/19> removed at discharge  Trach> changed by ENT 10/3  Foley catheter  Rectal tube  PEG  Antimicrobials:  Meropenem 9/20 >> Linezolid 9/19 >>9/25 Aztreonam 9/19 >> 9/19 Levaquin 9/19 >>9/19   Discharge Instructions  Discharge Instructions    (HEART FAILURE PATIENTS) Call MD:  Anytime you have any of the following symptoms: 1) 3 pound weight gain in 24 hours or 5 pounds in 1 week 2) shortness of breath, with or without a dry hacking cough 3) swelling in the hands, feet or stomach 4) if you have to sleep on extra pillows at night in order to breathe.    Complete by:  As directed    Call MD for:  difficulty breathing, headache or visual disturbances    Complete by:  As directed    Call MD for:  extreme fatigue    Complete by:  As directed    Call MD for:  persistant dizziness or light-headedness    Complete by:  As directed    Call MD for:  persistant nausea and vomiting    Complete by:  As directed    Call MD for:  redness, tenderness, or signs of infection (pain, swelling,  redness, odor or green/yellow discharge around incision site)    Complete by:  As directed    Call MD for:  severe uncontrolled pain    Complete by:  As directed    Call MD for:  temperature >100.4    Complete by:  As directed    Discharge instructions    Complete by:  As directed    1) NPO. On tube feeds. 2) Please follow WOC recommendations made in the hospital re wound care.   Increase activity slowly    Complete by:  As directed        Medication List    STOP taking these medications   aspirin EC 81 MG tablet   ibuprofen 800 MG tablet Commonly known as:  ADVIL,MOTRIN   levETIRAcetam 500 MG tablet Commonly known as:  KEPPRA Replaced by:  levETIRAcetam 100 MG/ML solution   magnesium oxide 400 MG tablet Commonly known as:  MAG-OX   metoCLOPramide 5 MG tablet Commonly known as:  REGLAN   morphine 2 MG/ML injection   potassium chloride 20 MEQ packet Commonly known as:  KLOR-CON   potassium phosphate (monobasic) 500 MG tablet Commonly known as:  K-PHOS ORIGINAL  promethazine in dextrose solution     TAKE these medications   acetaminophen 325 MG tablet Commonly known as:  TYLENOL Place 2 tablets (650 mg total) into feeding tube every 6 (six) hours as needed for mild pain, moderate pain or fever. What changed:  how to take this  reasons to take this   albuterol (2.5 MG/3ML) 0.083% nebulizer solution Commonly known as:  PROVENTIL Take 2.5 mg by nebulization every 6 (six) hours as needed for wheezing or shortness of breath.   amiodarone 100 MG tablet Commonly known as:  PACERONE Place 100 mg into feeding tube daily.   chlorhexidine 0.12 % solution Commonly known as:  PERIDEX Use as directed 15 mLs in the mouth or throat 2 (two) times daily.   famotidine 20 MG tablet Commonly known as:  PEPCID Place 1 tablet (20 mg total) into feeding tube 2 (two) times daily. What changed:  how to take this   feeding supplement (VITAL AF 1.2 CAL) Liqd Place 1,000 mLs  into feeding tube continuous. 1,000 mL, Per Tube, at 70 mL/hr, Continuous   free water Soln Place 200 mLs into feeding tube every 8 (eight) hours.   furosemide 40 MG tablet Commonly known as:  LASIX Place 40 mg into feeding tube daily.   hydroxypropyl methylcellulose / hypromellose 2.5 % ophthalmic solution Commonly known as:  ISOPTO TEARS / GONIOVISC Place 1 drop into both eyes as needed for dry eyes.   Lacosamide 100 MG Tabs Commonly known as:  VIMPAT Place 1 tablet (100 mg total) into feeding tube 2 (two) times daily. What changed:  how to take this   levETIRAcetam 100 MG/ML solution Commonly known as:  KEPPRA Place 5 mLs (500 mg total) into feeding tube 2 (two) times daily. Replaces:  levETIRAcetam 500 MG tablet   loperamide 1 MG/5ML solution Commonly known as:  IMODIUM Place 5 mLs (1 mg total) into feeding tube 2 (two) times daily as needed for diarrhea or loose stools.   midodrine 10 MG tablet Commonly known as:  PROAMATINE Place 10 mg into feeding tube every 8 (eight) hours.   multivitamin with minerals Tabs tablet Place 1 tablet into feeding tube daily. What changed:  how to take this   saccharomyces boulardii 250 MG capsule Commonly known as:  FLORASTOR Take 1 capsule (250 mg total) by mouth 2 (two) times daily. Via Tube.   vitamin C 500 MG tablet Commonly known as:  ASCORBIC ACID Place 1 tablet (500 mg total) into feeding tube daily. What changed:  how to take this       Allergies  Allergen Reactions  . Ativan [Lorazepam]   . Azithromycin Other (See Comments)    Blisters all over body  . Ceftriaxone Other (See Comments)    Blisters all over body  . Vancomycin Other (See Comments)    Blisters all over body     Procedures/Studies: US Scrotum  Result Date: 06/06/2016 CLINICAL DATA:  Edema x3 days, history of quadriplegia EXAM: ULTRASOUND OF SCROTUM TECHNIQUE: Complete ultrasound examination of the testicles, epididymis, and other scrotal structures  was performed. COMPARISON:  None. FINDINGS: Right testicle Measurements: 3.4 x 2.0 x 2.1 cm. Heterogeneous parenchyma without focal mass. No microlithiasis visualized. Left testicle Measurements: 3.3 x 2.3 x 2.9 cm. Heterogeneous parenchyma without focal mass. No microlithiasis visualized. Right epididymis:  7 x 5 x 7 mm epididymal cyst. Left epididymis:  Normal in size and appearance. Hydrocele:  None visualized. Varicocele:  None visualized. Additional comments: Moderate scrotal wall  edema. No discrete fluid collection/abscess. IMPRESSION: Moderate scrotal wall edema.  No discrete fluid collection/abscess. Electronically Signed   By: Charline Bills M.D.   On: 06/06/2016 17:03   Dg Chest Port 1 View  Result Date: 06/12/2016 CLINICAL DATA:  Bleeding around tracheostomy. EXAM: PORTABLE CHEST 1 VIEW COMPARISON:  06/10/2016 FINDINGS: Tracheostomy appears unchanged and unremarkable. Pleural effusions have enlarged bilaterally with volume loss in both lungs. Airspace barium on the right is unchanged. IMPRESSION: Enlarging effusions bilaterally with worsened volume loss in both lungs. No specific abnormal finding related to the tracheostomy itself. Electronically Signed   By: Paulina Fusi M.D.   On: 06/12/2016 12:01   Dg Chest Port 1 View  Result Date: 06/10/2016 CLINICAL DATA:  Respiratory failure EXAM: PORTABLE CHEST 1 VIEW COMPARISON:  06/05/2016 FINDINGS: Tracheostomy to unchanged. Potential abandoned catheter in the RIGHT neck. Stable cardiac silhouette. There bilateral pleural effusions not changed. Upper lungs are relatively clear IMPRESSION: No significant change.  Stable support apparatus. Bilateral moderate pleural effusions. Electronically Signed   By: Genevive Bi M.D.   On: 06/10/2016 11:29   Dg Chest Port 1 View  Result Date: 06/05/2016 CLINICAL DATA:  Acute onset of vomiting an shortness of breath. Assess for pneumonia. Initial encounter. EXAM: PORTABLE CHEST 1 VIEW COMPARISON:  Chest  radiograph performed 06/04/2016 FINDINGS: Small bilateral pleural effusions are noted, left greater than right, with bibasilar airspace opacities, concerning for pulmonary edema. This is relatively similar to the recent prior study. Scattered calcification is again noted overlying the right midlung zone. No pneumothorax is seen. The tracheostomy tube is seen ending 4-5 cm above the carina. The cardiomediastinal silhouette normal is borderline enlarged. No acute osseous abnormalities are seen. IMPRESSION: Small bilateral pleural effusions, left greater than right, with bibasilar airspace opacities, concerning for pulmonary edema. This is relatively similar to the recent prior study. Borderline cardiomegaly. Electronically Signed   By: Roanna Raider M.D.   On: 06/05/2016 02:59   Dg Chest Port 1 View  Result Date: 06/04/2016 CLINICAL DATA:  Concern for aspiration EXAM: PORTABLE CHEST 1 VIEW COMPARISON:  05/29/2016 FINDINGS: The tracheostomy tube tip is above the carina. There are bilateral pleural effusions which appear increased in volume from the previous exam. There is resultant decreased aeration to both lungs. Aspirated barium is identified within the right upper lobe. IMPRESSION: 1. Increase in bilateral pleural effusions with worsening aeration to both lungs. Electronically Signed   By: Signa Kell M.D.   On: 06/04/2016 22:58   Dg Chest Port 1 View  Result Date: 05/29/2016 CLINICAL DATA:  Unsuccessful central line attempt EXAM: PORTABLE CHEST 1 VIEW COMPARISON:  05/28/2016 FINDINGS: Tracheostomy in the mid trachea 3.5 cm above the carina. Similar hazy opacification of both lungs compatible with posterior layering effusions and bibasilar consolidation/atelectasis. Limited portable supine exam. No large pneumothorax appreciated. Stable punctate calcific densities throughout the right chest, indeterminate. Mild cardiac enlargement. No significant osseous finding. Monitor leads overlie the chest.  IMPRESSION: Persistent posterior layering pleural effusions bilaterally with bibasilar consolidation/atelectasis. Stable tracheostomy No large pneumothorax by portable radiography.  Overall stable exam. Electronically Signed   By: Judie Petit.  Shick M.D.   On: 05/29/2016 19:16   Dg Chest Port 1 View  Result Date: 05/29/2016 CLINICAL DATA:  Fever. EXAM: PORTABLE CHEST 1 VIEW COMPARISON:  None. FINDINGS: Extensive hazy opacification of the bilateral chest with streaky interstitial densities. Stippled calcific type density over the right chest which could be parenchymal or artifactual. Sacrificed or at least discontinuous shunt catheter over  the right neck. Tracheostomy tube is well seated. Normal heart size. Negative mediastinal contours for rotation. Osteopenia in this patient with quadriplegia. 1 cm nodular density over the left base. IMPRESSION: 1. Bilateral pleural effusion with pneumonia or atelectasis. 2. Artifact or nonspecific calcification over the right chest. 3. 1 cm nodule over the left base, likely nipple shadow. Attention on follow-up. Electronically Signed   By: Marnee Spring M.D.   On: 05/29/2016 10:55   Dg Abd Portable 1v  Result Date: 06/05/2016 CLINICAL DATA:  Acute onset of vomiting.  Initial encounter. EXAM: PORTABLE ABDOMEN - 1 VIEW COMPARISON:  None. FINDINGS: The visualized bowel gas pattern is unremarkable. Scattered air and stool filled loops of colon are seen; no abnormal dilatation of small bowel loops is seen to suggest small bowel obstruction. No free intra-abdominal air is identified, though evaluation for free air is limited on a single supine view. The stomach is partially filled with air. Scattered focal densities at the right mid chest may be within the soft tissues or may reflect chronic parenchymal calcification. Clips are noted within the right upper quadrant, reflecting prior cholecystectomy. The visualized osseous structures are within normal limits; the sacroiliac joints are  unremarkable in appearance. Small bilateral pleural effusions are seen. IMPRESSION: 1. Unremarkable bowel gas pattern; no free intra-abdominal air seen. The stomach is partially filled with air. 2. Small bilateral pleural effusions noted. Electronically Signed   By: Roanna Raider M.D.   On: 06/05/2016 02:58      Subjective: Nonverbal. Responds to questions by head nods. As per RN, no acute issues.   Discharge Exam:  Vitals:   06/18/16 1431 06/18/16 1500 06/18/16 1603 06/18/16 1610  BP:   110/81   Pulse:   87   Resp:   15   Temp: 98.2 F (36.8 C) 98.4 F (36.9 C) 99.3 F (37.4 C)   TempSrc: Oral Axillary Axillary   SpO2:   100% 100%  Weight:      Height:        General exam: Chronically ill-looking middle-aged male lying propped up in bed.Decreased conjunctival edema and facial puffiness. Respiratory system: Reduced breath sounds in the bases. Harsh breath sounds elsewhere.Marland Kitchen Respiratory effort normal. Cardiovascular system: S1 & S2 heard, RRR. No JVD, murmurs, rubs, gallops or clicks. 1+  pedal edema.Telemetry: sinus tachycardia in the 100's - SR. Gastrointestinal system: Abdomen is nondistended, soft and nontender. No organomegaly or masses felt. Normal bowel sounds heard.PEG tube +. Rectal tube with liquid green stools-Amount seems to have decreased over the last couple of days.  Central nervous system: Alert. Nonverbal. Responds to questions by head nods.  Extremities: Contractures of all 4 extremities with edema and multiple wounds on lower extremities  Skin: Multiple wounds as per wound care team.  Psychiatry: Cannot be assessed due to TBI.    The results of significant diagnostics from this hospitalization (including imaging, microbiology, ancillary and laboratory) are listed below for reference.     Microbiology: No results found for this or any previous visit (from the past 240 hour(s)).   Labs: BNP (last 3 results) No results for input(s): BNP in the last 8760  hours. Basic Metabolic Panel:  Recent Labs Lab 06/12/16 0506 06/13/16 0400 06/14/16 0500 06/15/16 1450 06/18/16 0408  NA 138  --  141 138 138  K 4.7  --  4.8 4.2 4.2  CL 106  --  105 105 102  CO2 29  --  29 31 31   GLUCOSE 104*  --  97 111* 103*  BUN 26*  --  29* 23* 22*  CREATININE 0.34*  --  <0.30* <0.30* 0.33*  CALCIUM 7.7*  --  8.0* 8.0* 7.7*  MG 1.7 1.8  --   --   --    Liver Function Tests: No results for input(s): AST, ALT, ALKPHOS, BILITOT, PROT, ALBUMIN in the last 168 hours. No results for input(s): LIPASE, AMYLASE in the last 168 hours. No results for input(s): AMMONIA in the last 168 hours. CBC:  Recent Labs Lab 06/12/16 0506  06/13/16 0400 06/13/16 1700 06/14/16 0500 06/15/16 1450 06/18/16 0408 06/18/16 1605  WBC 15.5*  --  16.3* 16.4* 18.0* 20.2* 16.1* 14.3*  NEUTROABS 12.2*  --  13.2* 13.0* 14.8*  --  11.8*  --   HGB 9.6*  < > 7.9* 7.1* 7.5* 7.7* 7.1* 8.5*  HCT 29.7*  < > 25.1* 21.9* 23.8* 24.3* 22.5* 26.1*  MCV 94.0  --  96.5 96.1 97.9 98.4 100.4* 96.7  PLT 125*  --  135* 120* 144* 192 255 248  < > = values in this interval not displayed. Cardiac Enzymes: No results for input(s): CKTOTAL, CKMB, CKMBINDEX, TROPONINI in the last 168 hours. BNP: Invalid input(s): POCBNP CBG:  Recent Labs Lab 06/18/16 0012 06/18/16 0454 06/18/16 0729 06/18/16 1156 06/18/16 1600  GLUCAP 105* 115* 128* 108* 108*   Urinalysis    Component Value Date/Time   COLORURINE YELLOW 06/04/2016 2034   APPEARANCEUR CLEAR 06/04/2016 2034   LABSPEC 1.027 06/04/2016 2034   PHURINE 6.0 06/04/2016 2034   GLUCOSEU NEGATIVE 06/04/2016 2034   HGBUR NEGATIVE 06/04/2016 2034   BILIRUBINUR NEGATIVE 06/04/2016 2034   KETONESUR NEGATIVE 06/04/2016 2034   PROTEINUR NEGATIVE 06/04/2016 2034   NITRITE NEGATIVE 06/04/2016 2034   LEUKOCYTESUR NEGATIVE 06/04/2016 2034   I called and discussed with patient's mother this morning, updated care and answered questions. Subsequently this  afternoon, I discussed with patient's brother [name], updated care and answered questions and advised him that patient will be discharged today to SNF. He verbalized agreement and understanding.   Time coordinating discharge: Over 30 minutes  SIGNED:  Marcellus Scott, MD, FACP, FHM. [facility] Pager [phone]  If 7PM-7AM, please contact night-coverage [email or web address] Password [email or web address] 06/18/2016, 5:23 PM

## 2016-06-18 NOTE — Progress Notes (Addendum)
[facility] arrived to transport pt to [facility] for [facility] care.  Report called to Building services engineer at [facility].  Transport report called to [facility] [employer] and confirmed in person. Pt vital signs stable.  CCMD notified of discharge.  Pts mother notified of discharge and transport.

## 2016-06-18 NOTE — Progress Notes (Signed)
Patient will DC to: [facility] Anticipated DC date: 06/18/16 Family notified: Brother; Mother Transport by: Melburn Hake   Per MD patient ready for DC to [facility]. RN, patient, patient's family, and facility notified of DC. Discharge Summary sent to facility. RN given number for report [phone] Room 304). DC packet on chart. RN to call [facility] for transport [phone]; Receiving MD is M. Angelina Ok). Please complete MTALA form on Epic to give to [facility].    CSW signing off.  Cristobal Goldmann, [place] Clinical Social Worker [phone]

## 2016-06-18 NOTE — Progress Notes (Signed)
[facility] has a bed available this afternoon. CSW alerted MD.  Cristobal GoldmannNadia Gregroy Dombkowski LCSWA [phone]

## 2016-06-19 LAB — TYPE AND SCREEN
ABO/RH(D): A POS
Antibody Screen: NEGATIVE
Unit division: 0

## 2016-06-29 LAB — CULTURE, RESPIRATORY

## 2016-06-29 LAB — CULTURE, RESPIRATORY W GRAM STAIN

## 2016-07-23 LAB — SUSCEPTIBILITY RESULT

## 2017-01-04 ENCOUNTER — Encounter (HOSPITAL_COMMUNITY): Payer: Self-pay

## 2017-01-04 ENCOUNTER — Inpatient Hospital Stay (HOSPITAL_COMMUNITY)
Admission: EM | Admit: 2017-01-04 | Discharge: 2017-01-11 | DRG: 208 | Disposition: A | Discharge status: Skilled Nursing Facility | Payer: Medicare Other | Attending: Internal Medicine | Admitting: Internal Medicine

## 2017-01-04 ENCOUNTER — Emergency Department (HOSPITAL_COMMUNITY): Payer: Medicare Other

## 2017-01-04 DIAGNOSIS — Y848 Other medical procedures as the cause of abnormal reaction of the patient, or of later complication, without mention of misadventure at the time of the procedure: Secondary | ICD-10-CM | POA: Diagnosis present

## 2017-01-04 DIAGNOSIS — R000 Tachycardia, unspecified: Secondary | ICD-10-CM | POA: Diagnosis not present

## 2017-01-04 DIAGNOSIS — G35 Multiple sclerosis: Secondary | ICD-10-CM | POA: Diagnosis not present

## 2017-01-04 DIAGNOSIS — J690 Pneumonitis due to inhalation of food and vomit: Secondary | ICD-10-CM | POA: Diagnosis present

## 2017-01-04 DIAGNOSIS — Z66 Do not resuscitate: Secondary | ICD-10-CM | POA: Diagnosis present

## 2017-01-04 DIAGNOSIS — J95851 Ventilator associated pneumonia: Secondary | ICD-10-CM | POA: Diagnosis present

## 2017-01-04 DIAGNOSIS — R509 Fever, unspecified: Secondary | ICD-10-CM

## 2017-01-04 DIAGNOSIS — Z22322 Carrier or suspected carrier of Methicillin resistant Staphylococcus aureus: Secondary | ICD-10-CM

## 2017-01-04 DIAGNOSIS — G9340 Encephalopathy, unspecified: Secondary | ICD-10-CM | POA: Diagnosis not present

## 2017-01-04 DIAGNOSIS — R1310 Dysphagia, unspecified: Secondary | ICD-10-CM | POA: Diagnosis present

## 2017-01-04 DIAGNOSIS — J9621 Acute and chronic respiratory failure with hypoxia: Secondary | ICD-10-CM | POA: Diagnosis not present

## 2017-01-04 DIAGNOSIS — E872 Acidosis: Secondary | ICD-10-CM | POA: Diagnosis present

## 2017-01-04 DIAGNOSIS — J189 Pneumonia, unspecified organism: Secondary | ICD-10-CM | POA: Diagnosis not present

## 2017-01-04 DIAGNOSIS — K219 Gastro-esophageal reflux disease without esophagitis: Secondary | ICD-10-CM | POA: Diagnosis present

## 2017-01-04 DIAGNOSIS — F0781 Postconcussional syndrome: Secondary | ICD-10-CM | POA: Diagnosis present

## 2017-01-04 DIAGNOSIS — L89224 Pressure ulcer of left hip, stage 4: Secondary | ICD-10-CM | POA: Diagnosis not present

## 2017-01-04 DIAGNOSIS — G9341 Metabolic encephalopathy: Secondary | ICD-10-CM | POA: Diagnosis present

## 2017-01-04 DIAGNOSIS — Z881 Allergy status to other antibiotic agents status: Secondary | ICD-10-CM

## 2017-01-04 DIAGNOSIS — R40243 Glasgow coma scale score 3-8, unspecified time: Secondary | ICD-10-CM | POA: Diagnosis present

## 2017-01-04 DIAGNOSIS — M2450 Contracture, unspecified joint: Secondary | ICD-10-CM | POA: Diagnosis present

## 2017-01-04 DIAGNOSIS — J0410 Acute tracheitis without obstruction: Secondary | ICD-10-CM | POA: Diagnosis present

## 2017-01-04 DIAGNOSIS — Z930 Tracheostomy status: Secondary | ICD-10-CM

## 2017-01-04 DIAGNOSIS — Z79899 Other long term (current) drug therapy: Secondary | ICD-10-CM

## 2017-01-04 DIAGNOSIS — Z4659 Encounter for fitting and adjustment of other gastrointestinal appliance and device: Secondary | ICD-10-CM

## 2017-01-04 DIAGNOSIS — Y95 Nosocomial condition: Secondary | ICD-10-CM | POA: Diagnosis not present

## 2017-01-04 DIAGNOSIS — Z8619 Personal history of other infectious and parasitic diseases: Secondary | ICD-10-CM

## 2017-01-04 DIAGNOSIS — R030 Elevated blood-pressure reading, without diagnosis of hypertension: Secondary | ICD-10-CM | POA: Diagnosis not present

## 2017-01-04 DIAGNOSIS — E119 Type 2 diabetes mellitus without complications: Secondary | ICD-10-CM | POA: Diagnosis present

## 2017-01-04 DIAGNOSIS — T85598A Other mechanical complication of other gastrointestinal prosthetic devices, implants and grafts, initial encounter: Secondary | ICD-10-CM

## 2017-01-04 DIAGNOSIS — Z931 Gastrostomy status: Secondary | ICD-10-CM

## 2017-01-04 DIAGNOSIS — J9611 Chronic respiratory failure with hypoxia: Secondary | ICD-10-CM

## 2017-01-04 DIAGNOSIS — L89512 Pressure ulcer of right ankle, stage 2: Secondary | ICD-10-CM | POA: Diagnosis not present

## 2017-01-04 DIAGNOSIS — J9610 Chronic respiratory failure, unspecified whether with hypoxia or hypercapnia: Secondary | ICD-10-CM

## 2017-01-04 DIAGNOSIS — I5032 Chronic diastolic (congestive) heart failure: Secondary | ICD-10-CM | POA: Diagnosis not present

## 2017-01-04 DIAGNOSIS — S069X9S Unspecified intracranial injury with loss of consciousness of unspecified duration, sequela: Secondary | ICD-10-CM

## 2017-01-04 DIAGNOSIS — R451 Restlessness and agitation: Secondary | ICD-10-CM | POA: Diagnosis not present

## 2017-01-04 DIAGNOSIS — G40909 Epilepsy, unspecified, not intractable, without status epilepticus: Secondary | ICD-10-CM | POA: Diagnosis present

## 2017-01-04 DIAGNOSIS — I482 Chronic atrial fibrillation: Secondary | ICD-10-CM | POA: Diagnosis not present

## 2017-01-04 DIAGNOSIS — Z87820 Personal history of traumatic brain injury: Secondary | ICD-10-CM | POA: Diagnosis not present

## 2017-01-04 DIAGNOSIS — Z888 Allergy status to other drugs, medicaments and biological substances status: Secondary | ICD-10-CM

## 2017-01-04 DIAGNOSIS — Z79891 Long term (current) use of opiate analgesic: Secondary | ICD-10-CM

## 2017-01-04 DIAGNOSIS — D638 Anemia in other chronic diseases classified elsewhere: Secondary | ICD-10-CM | POA: Diagnosis present

## 2017-01-04 DIAGNOSIS — I9589 Other hypotension: Secondary | ICD-10-CM | POA: Diagnosis not present

## 2017-01-04 DIAGNOSIS — R532 Functional quadriplegia: Secondary | ICD-10-CM | POA: Diagnosis not present

## 2017-01-04 DIAGNOSIS — D72829 Elevated white blood cell count, unspecified: Secondary | ICD-10-CM

## 2017-01-04 DIAGNOSIS — N319 Neuromuscular dysfunction of bladder, unspecified: Secondary | ICD-10-CM | POA: Diagnosis present

## 2017-01-04 DIAGNOSIS — L8990 Pressure ulcer of unspecified site, unspecified stage: Secondary | ICD-10-CM

## 2017-01-04 DIAGNOSIS — Z9911 Dependence on respirator [ventilator] status: Secondary | ICD-10-CM | POA: Diagnosis not present

## 2017-01-04 DIAGNOSIS — J9601 Acute respiratory failure with hypoxia: Secondary | ICD-10-CM

## 2017-01-04 DIAGNOSIS — I959 Hypotension, unspecified: Secondary | ICD-10-CM | POA: Diagnosis not present

## 2017-01-04 DIAGNOSIS — R627 Adult failure to thrive: Secondary | ICD-10-CM | POA: Diagnosis present

## 2017-01-04 DIAGNOSIS — Z1624 Resistance to multiple antibiotics: Secondary | ICD-10-CM | POA: Diagnosis present

## 2017-01-04 HISTORY — DX: Heart failure, unspecified: I50.9

## 2017-01-04 HISTORY — DX: Unspecified convulsions: R56.9

## 2017-01-04 LAB — CBC WITH DIFFERENTIAL/PLATELET
BASOS ABS: 0 10*3/uL (ref 0.0–0.1)
BASOS PCT: 0 %
EOS PCT: 1 %
Eosinophils Absolute: 0.1 10*3/uL (ref 0.0–0.7)
HCT: 37.1 % — ABNORMAL LOW (ref 39.0–52.0)
Hemoglobin: 12 g/dL — ABNORMAL LOW (ref 13.0–17.0)
Lymphocytes Relative: 4 %
Lymphs Abs: 0.8 10*3/uL (ref 0.7–4.0)
MCH: 27.9 pg (ref 26.0–34.0)
MCHC: 32.3 g/dL (ref 30.0–36.0)
MCV: 86.3 fL (ref 78.0–100.0)
MONO ABS: 0.8 10*3/uL (ref 0.1–1.0)
Monocytes Relative: 4 %
Neutro Abs: 17.4 10*3/uL — ABNORMAL HIGH (ref 1.7–7.7)
Neutrophils Relative %: 91 %
PLATELETS: 360 10*3/uL (ref 150–400)
RBC: 4.3 MIL/uL (ref 4.22–5.81)
RDW: 16.2 % — AB (ref 11.5–15.5)
WBC: 19.1 10*3/uL — ABNORMAL HIGH (ref 4.0–10.5)

## 2017-01-04 LAB — URINALYSIS, ROUTINE W REFLEX MICROSCOPIC
BILIRUBIN URINE: NEGATIVE
Glucose, UA: NEGATIVE mg/dL
Hgb urine dipstick: NEGATIVE
Ketones, ur: NEGATIVE mg/dL
Nitrite: POSITIVE — AB
Protein, ur: NEGATIVE mg/dL
SPECIFIC GRAVITY, URINE: 1.017 (ref 1.005–1.030)
pH: 9 — ABNORMAL HIGH (ref 5.0–8.0)

## 2017-01-04 LAB — COMPREHENSIVE METABOLIC PANEL
ALBUMIN: 4 g/dL (ref 3.5–5.0)
ALK PHOS: 109 U/L (ref 38–126)
ALT: 34 U/L (ref 17–63)
AST: 53 U/L — AB (ref 15–41)
Anion gap: 13 (ref 5–15)
BUN: 24 mg/dL — AB (ref 6–20)
CALCIUM: 10 mg/dL (ref 8.9–10.3)
CO2: 27 mmol/L (ref 22–32)
Chloride: 94 mmol/L — ABNORMAL LOW (ref 101–111)
Creatinine, Ser: 0.73 mg/dL (ref 0.61–1.24)
GFR calc Af Amer: >60 mL/min (ref >60)
GFR calc non Af Amer: >60 mL/min (ref >60)
GLUCOSE: 196 mg/dL — AB (ref 65–99)
Potassium: 4.7 mmol/L (ref 3.5–5.1)
Sodium: 134 mmol/L — ABNORMAL LOW (ref 135–145)
TOTAL PROTEIN: 9.2 g/dL — AB (ref 6.5–8.1)
Total Bilirubin: 0.5 mg/dL (ref 0.3–1.2)

## 2017-01-04 LAB — GLUCOSE, CAPILLARY
GLUCOSE-CAPILLARY: 91 mg/dL (ref 65–99)
GLUCOSE-CAPILLARY: 95 mg/dL (ref 65–99)
GLUCOSE-CAPILLARY: 103 mg/dL — AB (ref 65–99)
Glucose-Capillary: 127 mg/dL — ABNORMAL HIGH (ref 65–99)
Glucose-Capillary: 122 mg/dL — ABNORMAL HIGH (ref 65–99)

## 2017-01-04 LAB — I-STAT VENOUS BLOOD GAS, ED
Acid-Base Excess: 5 mmol/L — ABNORMAL HIGH (ref 0.0–2.0)
Bicarbonate: 32.3 mmol/L — ABNORMAL HIGH (ref 20.0–28.0)
O2 Saturation: 59 %
PH VEN: 7.328 (ref 7.250–7.430)
TCO2: 34 mmol/L (ref 0–100)
pCO2, Ven: 61.7 mmHg — ABNORMAL HIGH (ref 44.0–60.0)
pO2, Ven: 34 mmHg (ref 32.0–45.0)

## 2017-01-04 LAB — HIV ANTIBODY (ROUTINE TESTING W REFLEX): HIV SCREEN 4TH GENERATION: NONREACTIVE

## 2017-01-04 LAB — STREP PNEUMONIAE URINARY ANTIGEN: Strep Pneumo Urinary Antigen: NEGATIVE

## 2017-01-04 LAB — PROCALCITONIN: Procalcitonin: 1.76 ng/mL

## 2017-01-04 LAB — I-STAT CG4 LACTIC ACID, ED
LACTIC ACID, VENOUS: 2.21 mmol/L — AB (ref 0.5–1.9)
Lactic Acid, Venous: 3.31 mmol/L (ref 0.5–1.9)

## 2017-01-04 LAB — TROPONIN I
TROPONIN I: 0.04 ng/mL — AB (ref <0.03)
TROPONIN I: 0.06 ng/mL — AB (ref <0.03)
Troponin I: 0.08 ng/mL (ref <0.03)
Troponin I: 0.07 ng/mL (ref <0.03)

## 2017-01-04 LAB — BRAIN NATRIURETIC PEPTIDE: B Natriuretic Peptide: 63.7 pg/mL (ref 0.0–100.0)

## 2017-01-04 LAB — URINALYSIS, MICROSCOPIC (REFLEX): RBC / HPF: NONE SEEN RBC/hpf (ref 0–5)

## 2017-01-04 LAB — LACTIC ACID, PLASMA: LACTIC ACID, VENOUS: 1.7 mmol/L (ref 0.5–1.9)

## 2017-01-04 LAB — MRSA PCR SCREENING: MRSA by PCR: POSITIVE — AB

## 2017-01-04 MED ORDER — LEVETIRACETAM 100 MG/ML PO SOLN
500.0000 mg | Freq: Two times a day (BID) | ORAL | Status: DC
  Administered 2017-01-04 – 2017-01-05 (×3): 500 mg
  Filled 2017-01-04 (×4): qty 5

## 2017-01-04 MED ORDER — LINEZOLID 600 MG/300ML IV SOLN
600.0000 mg | Freq: Two times a day (BID) | INTRAVENOUS | Status: DC
  Administered 2017-01-04 – 2017-01-06 (×5): 600 mg via INTRAVENOUS
  Filled 2017-01-04 (×6): qty 300

## 2017-01-04 MED ORDER — MUPIROCIN 2 % EX OINT
1.0000 "application " | TOPICAL_OINTMENT | Freq: Two times a day (BID) | CUTANEOUS | Status: AC
  Administered 2017-01-04 – 2017-01-08 (×10): 1 via NASAL
  Filled 2017-01-04 (×2): qty 22

## 2017-01-04 MED ORDER — HEPARIN SODIUM (PORCINE) 5000 UNIT/ML IJ SOLN
5000.0000 [IU] | Freq: Three times a day (TID) | INTRAMUSCULAR | Status: DC
  Administered 2017-01-04 – 2017-01-10 (×17): 5000 [IU] via SUBCUTANEOUS
  Filled 2017-01-04 (×17): qty 1

## 2017-01-04 MED ORDER — INSULIN ASPART 100 UNIT/ML ~~LOC~~ SOLN
2.0000 [IU] | SUBCUTANEOUS | Status: DC
  Administered 2017-01-04 – 2017-01-05 (×3): 2 [IU] via SUBCUTANEOUS

## 2017-01-04 MED ORDER — ORAL CARE MOUTH RINSE
15.0000 mL | Freq: Four times a day (QID) | OROMUCOSAL | Status: DC
  Administered 2017-01-04 – 2017-01-11 (×27): 15 mL via OROMUCOSAL

## 2017-01-04 MED ORDER — JEVITY 1.2 CAL PO LIQD: 1000.0000 mL | ORAL | Status: DC

## 2017-01-04 MED ORDER — CHLORHEXIDINE GLUCONATE CLOTH 2 % EX PADS
6.0000 | MEDICATED_PAD | Freq: Every day | CUTANEOUS | Status: AC
  Administered 2017-01-04 – 2017-01-08 (×5): 6 via TOPICAL

## 2017-01-04 MED ORDER — SODIUM CHLORIDE 0.9 % IV SOLN: 250.0000 mL | INTRAVENOUS | Status: DC | PRN

## 2017-01-04 MED ORDER — SODIUM CHLORIDE 0.9 % IV BOLUS (SEPSIS): 1000.0000 mL | Freq: Once | INTRAVENOUS | Status: DC

## 2017-01-04 MED ORDER — LACOSAMIDE 50 MG PO TABS
100.0000 mg | ORAL_TABLET | Freq: Two times a day (BID) | ORAL | Status: DC
  Administered 2017-01-04 – 2017-01-05 (×3): 100 mg
  Filled 2017-01-04 (×3): qty 2

## 2017-01-04 MED ORDER — CHLORHEXIDINE GLUCONATE 0.12% ORAL RINSE (MEDLINE KIT)
15.0000 mL | Freq: Two times a day (BID) | OROMUCOSAL | Status: DC
  Administered 2017-01-04 – 2017-01-11 (×15): 15 mL via OROMUCOSAL

## 2017-01-04 MED ORDER — LACOSAMIDE 100 MG PO TABS: 100.0000 mg | ORAL_TABLET | Freq: Two times a day (BID) | ORAL | Status: DC

## 2017-01-04 MED ORDER — ALBUTEROL SULFATE (2.5 MG/3ML) 0.083% IN NEBU: 2.5000 mg | INHALATION_SOLUTION | Freq: Four times a day (QID) | RESPIRATORY_TRACT | Status: DC | PRN

## 2017-01-04 MED ORDER — SODIUM CHLORIDE 0.9 % IV SOLN
INTRAVENOUS | Status: DC
  Administered 2017-01-04 – 2017-01-05 (×2): via INTRAVENOUS

## 2017-01-04 MED ORDER — MEROPENEM 1 G IV SOLR
1.0000 g | Freq: Three times a day (TID) | INTRAVENOUS | Status: DC
  Administered 2017-01-04 – 2017-01-11 (×22): 1 g via INTRAVENOUS
  Filled 2017-01-04 (×23): qty 1

## 2017-01-04 MED ORDER — SODIUM CHLORIDE 0.9 % IV SOLN
1.0000 g | Freq: Three times a day (TID) | INTRAVENOUS | Status: DC
  Filled 2017-01-04: qty 1

## 2017-01-04 MED ORDER — VITAL AF 1.2 CAL PO LIQD
1000.0000 mL | ORAL | Status: DC
  Administered 2017-01-04 – 2017-01-05 (×3): 1000 mL

## 2017-01-04 MED ORDER — LINEZOLID 600 MG/300ML IV SOLN
600.0000 mg | Freq: Two times a day (BID) | INTRAVENOUS | Status: DC
  Administered 2017-01-04 (×2): 600 mg via INTRAVENOUS
  Filled 2017-01-04 (×3): qty 300

## 2017-01-04 MED ORDER — SODIUM CHLORIDE 0.9 % IV SOLN
1.0000 g | Freq: Once | INTRAVENOUS | Status: AC
  Administered 2017-01-04: 1 g via INTRAVENOUS
  Filled 2017-01-04: qty 1

## 2017-01-04 MED ORDER — LINEZOLID 600 MG/300ML IV SOLN: 600.0000 mg | Freq: Two times a day (BID) | INTRAVENOUS | Status: DC

## 2017-01-04 MED ORDER — SODIUM CHLORIDE 0.9 % IV BOLUS (SEPSIS)
1000.0000 mL | Freq: Once | INTRAVENOUS | Status: AC
  Administered 2017-01-04: 1000 mL via INTRAVENOUS

## 2017-01-04 MED ORDER — MIDODRINE HCL 5 MG PO TABS
10.0000 mg | ORAL_TABLET | Freq: Three times a day (TID) | ORAL | Status: DC
  Administered 2017-01-04 – 2017-01-05 (×6): 10 mg
  Filled 2017-01-04 (×8): qty 2

## 2017-01-04 MED ORDER — PIPERACILLIN-TAZOBACTAM 3.375 G IVPB
3.3750 g | Freq: Three times a day (TID) | INTRAVENOUS | Status: DC
  Administered 2017-01-04: 3.375 g via INTRAVENOUS
  Filled 2017-01-04 (×3): qty 50

## 2017-01-04 MED ORDER — PANTOPRAZOLE SODIUM 40 MG IV SOLR
40.0000 mg | Freq: Every day | INTRAVENOUS | Status: DC
  Administered 2017-01-04: 40 mg via INTRAVENOUS
  Filled 2017-01-04 (×2): qty 40

## 2017-01-04 MED ORDER — AMIODARONE HCL 100 MG PO TABS
100.0000 mg | ORAL_TABLET | Freq: Every day | ORAL | Status: DC
  Administered 2017-01-04 – 2017-01-05 (×2): 100 mg
  Filled 2017-01-04 (×2): qty 0.5
  Filled 2017-01-04: qty 1

## 2017-01-04 NOTE — H&P (Signed)
PULMONARY / CRITICAL CARE MEDICINE   Name: Dean Graham MRN: [id number] DOB: [birth year]    ADMISSION DATE:  01/04/2017 CONSULTATION DATE:  01/04/2017  REFERRING MD:  Dr. 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 He was ventilator dependent after his initial TBI for few years then he did not require vent support until recently 2-3 years ago when he needed to be intubated, has been vent dependent since (able to tolerate TC at time recently).   Presents from [facility] on 4/27 after being found unresponsive and apneic with brown/tan secretions through trach site, patient bagged for a couple of minutes and placed on vent. Upon arrival to ED patient was a GCS of 3, baseline mental status is involuntary movements in the upper extremities and ability to answer yes/no questions. CT Head Negative. CXR with bilateral peribronchial opacities. WBC 19.1, Lactic Acid 3.31. Hemodynamically stable. PCCM to admit.   Of note patient was admitted 9/19-10/9/17 with Pseudomonas PNA > ID involvement for multiple drug resistance.   SUBJECTIVE:  Remains on ventilator, no pressors or sedation. Hemodynamically stable.  VITAL SIGNS: BP 99/62   Pulse (!) 101   Temp 98.9 F (37.2 C) (Rectal)   Resp 16   Wt 79.4 kg (175 lb)   SpO2 99%   BMI 25.11 kg/m   HEMODYNAMICS:    VENTILATOR SETTINGS: Vent Mode: PRVC FiO2 (%):  [40 %] 40 % Set Rate:  [16 bmp-20 bmp] 20 bmp Vt Set:  [500 mL] 500 mL PEEP:  [5 cmH20] 5 cmH20  INTAKE / OUTPUT: No intake/output data recorded.  PHYSICAL EXAMINATION: General:  Chronically ill adult male, no distress  Neuro:  Lethargic, opens eyes to physical stimulation, does not follow commands  HEENT:  Trach intact > yellow/green out put, enlarged stoma   Cardiovascular:  A.fib, no MRG, NI S1/S2 Lungs:  Rhonchi, Diminished breath sounds no bases, no wheeze/crackles  Abdomen:  Non-distended, active bowel sounds  Musculoskeletal:  Contractures in all extremities   Skin:  Pressure sores noted to heels   LABS:  BMET  Recent Labs Lab 01/04/17 0219  NA 134*  K 4.7  CL 94*  CO2 27  BUN 24*  CREATININE 0.73  GLUCOSE 196*    Electrolytes  Recent Labs Lab 01/04/17 0219  CALCIUM 10.0    CBC  Recent Labs Lab 01/04/17 0219  WBC 19.1*  HGB 12.0*  HCT 37.1*  PLT 360    Coag's No results for input(s): APTT, INR in the last 168 hours.  Sepsis Markers  Recent Labs Lab 01/04/17 0231 01/04/17 0534  LATICACIDVEN 3.31* 2.21*    ABG No results for input(s): PHART, PCO2ART, PO2ART in the last 168 hours.  Liver Enzymes  Recent Labs Lab 01/04/17 0219  AST 53*  ALT 34  ALKPHOS 109  BILITOT 0.5  ALBUMIN 4.0    Cardiac Enzymes  Recent Labs Lab 01/04/17 0219  TROPONINI 0.04*    Glucose No results for input(s): GLUCAP in the last 168 hours.  Imaging  Ct Head Wo Contrast  Result Date: 01/04/2017 CLINICAL DATA:  56 year old male with altered mental status. EXAM: CT HEAD WITHOUT CONTRAST TECHNIQUE: Contiguous axial images were obtained from the base of the skull through the vertex without intravenous contrast. COMPARISON:  None. FINDINGS: Brain: There is  no acute intracranial hemorrhage. Areas of low attenuation and encephalomalacia noted in the frontal lobes bilaterally and right greater left likely related to old infarcts. Right posterior parietal approach catheter along the sylvian fissure noted. There is mild generalized brain atrophy. There is mild dilatation of the ventricular system which may be related to volume loss or represent NPH. Direct comparison with prior images, if available, is recommended to evaluate for interval change. There is atrophy of the cerebellum. Vascular: No hyperdense vessel or  unexpected calcification. Skull: Right frontoparietal craniotomy. No acute calvarial fracture. Sinuses/Orbits: Mucoperiosteal thickening of paranasal sinuses. No air-fluid levels. There is opacification of the mastoid air cells bilaterally, left greater right. Other: None IMPRESSION: 1. No acute intracranial hemorrhage. 2. Age-related atrophy. Mild dilatation of the ventricular system may be related to volume loss or represent NPH. Correlation with clinical exam recommended. Direct comparison with prior images, if available, recommended to evaluate for interval change. 3. 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 4. Right frontoparietal craniotomy and shunt catheter along the right sylvian fissure. Electronically Signed   By: Elgie Collard M.D.   On: 01/04/2017 04:37   Dg Chest Port 1 View  Result Date: 01/04/2017 CLINICAL DATA:  Respiratory failure EXAM: PORTABLE CHEST 1 VIEW COMPARISON:  Chest radiograph 06/12/2016 FINDINGS: The tip of the tracheostomy tube is just below the level of the clavicles. Cardiomediastinal contours are normal. Sequela of remote barium aspiration are again noted in the right lung. There are bilateral peribronchial opacities, likely mild pulmonary edema. There is no lobar consolidation. IMPRESSION: Tracheostomy tube tip just below the level of the clavicles and 5 cm above the inferior margin of the carina. Bilateral peribronchial opacities, likely mild pulmonary edema. No large area of consolidation. Electronically Signed   By: Deatra Robinson M.D.   On: 01/04/2017 03:06   STUDIES:  CXR 4/27 > Bilateral peribronchial opacities, likely mild pulmonary edema, no large consolidation  CT Head 4/27> no acute ICH, age-related atrophy, bifrontal old infarcts and encephalomalacia, right there are left, right frontoparietal craniotomy and shunt catheter along the right sylvian fissure   CULTURES: Blood 4/27 >  Sputum 4/27> MRSA 4/27 >> Legion Urine 4/27 >>   Strep Urine 4/27>>  ANTIBIOTICS: Allergic to azithromycin, ceftx, vanc, and ativan Meropenem 4/27 single dose Zosyn 4/27>> Linezolid 4/27>>  SIGNIFICANT EVENTS: 4/27 > Presents to ED   LINES/TUBES: Trach (Chronic) PEG (Chronic) Foley (Chronic)  DISCUSSION: 56 year old male presents from [facility] after being found unresponsive with tan sputum in tracheostomy, bagged and placed on vent. Sent to ED for further management. CAP vs Aspiration Event.   ASSESSMENT / PLAN:  PULMONARY A: Acute on Chronic Ventilator Dependent Respiratory Failure s/p Tracheostomy +/-Aspiration P:   Begin PS trials but no TC VAP prevention protocol  Trach care per protocol  Trend CXR and ABG Albuterol PRN  CARDIOVASCULAR A:  Chronic Hypotension  Chronic Diastolic Heart Failure  H/O A.fib P:  Cardiac monitoring  Continue amiodarone  Maintain MAP > 65 Trend Troponin  Continue home midodrine   RENAL A:   Lactic Acidosis  Neurogenic Bladder  P:   Trend BMP Replace electrolytes as needed  NS @ 75 ml/hr   GASTROINTESTINAL A:   Dysphagia s/p PEG Failure to Thrive  P:   PPI NPO  HEMATOLOGIC A:   Anemia of Chronic Disease  P:  Trend CBC Maintain Hbg > 7   INFECTIOUS A:   Aspiration Event vs CAP H/O MDR Stenotrophomonas maltophilia, MDR  Pseudomonas aeruginosa  P:   PAN culture  Zosyn and Linezolid  Trend Lactic acid and Procal  Trend Fever and WBC curve  May need to involve ID considering history of drug resistance   ENDOCRINE A:   Type 2 DM   P:   SSI Trend Glucose   NEUROLOGIC A:   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 Patient is a DNR with the exception of ventilator use.   - Inter-disciplinary family meet or Palliative Care meeting due by:  5/5    CC Time: 42 minutes   Jovita Kussmaul, AG-ACNP [facility]  Pgr: [phone]  PCCM Pgr: [phone]  Attending Note:  56 year old male s/p TBI and altered mental status from [facility].  Patient was transferred to the ICU for altered mental status and apnea.  On exam, patient is unresponsive but baseline is unknown.  I reviewed CXR myself, ETT in good position.  Discussed with PCCM-NP.  Admit to the ICU.  Begin zosyn and linezolid.  Pan cultures.  Start TF.  D/C IVF, hold home medications except amiodarone, keppra and midodrine.  PCT protocol.  D/C abx.  Transfer to SDU vent bed and to [facility] service with PCCM following for vent management.  The patient is critically ill with multiple organ systems failure and requires high complexity decision making for assessment and support, frequent evaluation and titration of therapies, application of advanced monitoring technologies and extensive interpretation of multiple databases.   Critical Care Time devoted to patient care services described in this note is  35  Minutes. This time reflects time of care of this signee Dr Koren Bound. This critical care time does not reflect procedure time, or teaching time or supervisory time of PA/NP/Med student/Med Resident etc but could involve care discussion time.  Alyson Reedy, M.D. [facility] Pulmonary/Critical Care Medicine. Pager: [phone]. After hours pager: [phone].

## 2017-01-04 NOTE — Progress Notes (Signed)
Pt from [facility] with a size 8 trach ( [employer]iper[employer] brand). Pt placed on vent but no trach at bedside.

## 2017-01-04 NOTE — Progress Notes (Addendum)
Initial Nutrition Assessment  DOCUMENTATION CODES:   Not applicable  INTERVENTION:    Initiate TF via PEG with Vital AF 1.2 at goal rate of 70 ml/h (1680 ml per day)    TF regimen to provide 2016 kcals, 126 gm protein, 1362 ml of free water  NUTRITION DIAGNOSIS:   Inadequate oral intake related to inability to eat as evidenced by NPO status  GOAL:   Patient will meet greater than or equal to 90% of their needs  MONITOR:   Vent status, TF tolerance, Labs, Weight trends, Skin, I & O's  REASON FOR ASSESSMENT:   Consult Enteral/tube feeding initiation and management  ASSESSMENT:   56 yo quadriplegic male s/p 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 He was ventilator dependent after his initial TBI for few years then he did not require vent support until recently 2-3 years ago when he needed to be intubated, has been vent dependent since (able to tolerate TC at time recently).   Patient is currently on ventilator support  MV: 11.1 L/min Temp (24hrs), Avg:97.8 F (36.6 C), Min:97.2 F (36.2 C), Max:98.9 F (37.2 C)  Pt admitted from [facility] after being found unresponsive and apneic. Eventually became more responsive and pulled out his trach.  Thick tan secretions were seen at that time. Medications reviewed.  Labs reviewed.  Sodium 134 (L). TF regimen via PEG tube PTA unknown. CBG's 91-95.  Nutrition Focused Physical Exam not applicable given spinal cord injury.  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  Estimated  Nutritional Needs:   Kcal:  1900  Protein:  120-130 gm  Fluid:  per MD  EDUCATION NEEDS:   No education needs identified at this time  Maureen Chatters, RD, LDN Pager #: [phone] After-Hours Pager #: [phone]

## 2017-01-04 NOTE — Progress Notes (Signed)
While suctioning the pt was able to get his left arm up to trach and dislodge his trach with the balloon intact. Dr Molli Knock on the floor and placed #8 cuffed shiley. Pt tolerated well. HR low 100's and O2 95%. Will continue to monitor. Pt left arm placed in soft wrist restraint.

## 2017-01-04 NOTE — ED Triage Notes (Addendum)
Pt from [facility] with [facility]. Pt was found around 1140pm tonight with trach tube "spitting out tube feeding" Pt had weak pulses and was not breathing according to staff at [facility]. Pt was bagged for 2 mins and then placed on ventilator. No CPR performed. Pt is unresponsive to any stimulation. GCS 3 Pupils 4 and sluggish and no reflexes. Pt sinus tach on the monitor at 127. BP 103/75.

## 2017-01-04 NOTE — Consult Note (Addendum)
[facility] for Infectious Disease  Date of Admission:  01/04/2017  Date of Consult:  01/04/2017  Reason for Consult: MDR tracheitis Referring Physician: Yacoub  Impression/Recommendation Hx MDR organisms Pneumonia MS change  He is improved post narcan, hydration.  Would continue his current anbx If he has change in status, would consider zerbaxa.  He has limited anbx options Fortunately, usually patients in this situation improve with good pulmonary toilet.  Will ask Dr Baxter Flattery to f/u over w/e.  Pressure ulcers Appreciate WOC  Thank you so much for this interesting consult,   Dean Graham (pager) [phone] [email or web address]  Dean Graham is an 56 y.o. male.  HPI: 56 yo M with hx of TBI, chronic respiratory failure (occas on vent, tracheostomy).  He was adm from [facility] today after being found unresponsive and apneic with brown, tan secretions. He eventually became more responsive and pulled out his trach. Thick secretions were seen at that time.  He is afebrile, WBC 19.1. His CXR shows bilateral peribronchial opacities. Likely mild pulm edema.  He was started on merrem and zyvox.    Tach aspirate- 06-04-16 P aeruginosa- S ceftaz S maltophila- I- levaquin Acinetobacter- I- gent  Past Medical History:  Diagnosis Date  . Atrial fibrillation ([place])   . CHF (congestive heart failure) ([place])   . Chronic respiratory failure ([place])   . Diabetes mellitus without complication ([place])   . Diastolic heart failure ([place])   . Dysphagia   . GERD (gastroesophageal reflux disease)   . Quadriplegia ([place])   . Seizures ([place])     History reviewed. No pertinent surgical history.   Allergies  Allergen Reactions  . Ativan [Lorazepam] Other (See Comments)    unknown  . Azithromycin Other (See Comments)    Blisters all over body  . Ceftriaxone Other (See Comments)    Blisters all over body  . Vancomycin Other (See Comments)    Blisters all over body     Medications:  Scheduled: . amiodarone  100 mg Per Tube Daily  . chlorhexidine gluconate (MEDLINE KIT)  15 mL Mouth Rinse BID  . Chlorhexidine Gluconate Cloth  6 each Topical Q0600  . heparin  5,000 Units Subcutaneous Q8H  . insulin aspart  2-6 Units Subcutaneous Q4H  . levETIRAcetam  500 mg Per Tube BID  . mouth rinse  15 mL Mouth Rinse QID  . midodrine  10 mg Per Tube TID  . mupirocin ointment  1 application Nasal BID  . pantoprazole (PROTONIX) IV  40 mg Intravenous QHS    Abtx:  Anti-infectives    Start     Dose/Rate Route Frequency Ordered Stop   01/04/17 2200  linezolid (ZYVOX) IVPB 600 mg     600 mg 300 mL/hr over 60 Minutes Intravenous Every 12 hours 01/04/17 1147     01/04/17 1600  meropenem (MERREM) 1 g in sodium chloride 0.9 % 100 mL IVPB     1 g 200 mL/hr over 30 Minutes Intravenous Every 8 hours 01/04/17 1147     01/04/17 1000  meropenem (MERREM) 1 g in sodium chloride 0.9 % 100 mL IVPB  Status:  Discontinued     1 g 200 mL/hr over 30 Minutes Intravenous Every 8 hours 01/04/17 0351 01/04/17 0613   01/04/17 1000  piperacillin-tazobactam (ZOSYN) IVPB 3.375 g  Status:  Discontinued     3.375 g 12.5 mL/hr over 240 Minutes Intravenous Every 8 hours 01/04/17 0623 01/04/17 1147   01/04/17 1000  linezolid (ZYVOX) IVPB  600 mg  Status:  Discontinued     600 mg 300 mL/hr over 60 Minutes Intravenous Every 12 hours 01/04/17 0633 01/04/17 0705   01/04/17 0300  linezolid (ZYVOX) IVPB 600 mg  Status:  Discontinued     600 mg 300 mL/hr over 60 Minutes Intravenous Every 12 hours 01/04/17 0254 01/04/17 1147   01/04/17 0300  meropenem (MERREM) 1 g in sodium chloride 0.9 % 100 mL IVPB     1 g 200 mL/hr over 30 Minutes Intravenous  Once 01/04/17 0255 01/04/17 0400      Total days of antibiotics: 0          Social History:  reports that he does not drink alcohol or use drugs. His tobacco history is not on file.  No family history on file.  General ROS: pt minimally  repsonsive, does answer to voice. ros unobtainable.   Blood pressure (!) 88/52, pulse (!) 113, temperature 97.4 F (36.3 C), temperature source Oral, resp. rate (!) 25, weight 79.4 kg (175 lb), SpO2 97 %. General appearance: fatigued, no distress and answers to voice Eyes: disconjugate gaze.  Neck: midline trach with secretions around site.  Lungs: rhonchi anterior - bilateral Heart: tachycardia Abdomen: normal findings: bowel sounds normal and soft, non-tender Extremities: LE contractures. peripheral IV LUE.    Results for orders placed or performed during the hospital encounter of 01/04/17 (from the past 48 hour(s))  Comprehensive metabolic panel     Status: Abnormal   Collection Time: 01/04/17  2:19 AM  Result Value Ref Range   Sodium 134 (L) 135 - 145 mmol/L   Potassium 4.7 3.5 - 5.1 mmol/L   Chloride 94 (L) 101 - 111 mmol/L   CO2 27 22 - 32 mmol/L   Glucose, Bld 196 (H) 65 - 99 mg/dL   BUN 24 (H) 6 - 20 mg/dL   Creatinine, Ser 0.73 0.61 - 1.24 mg/dL   Calcium 10.0 8.9 - 10.3 mg/dL   Total Protein 9.2 (H) 6.5 - 8.1 g/dL   Albumin 4.0 3.5 - 5.0 g/dL   AST 53 (H) 15 - 41 U/L   ALT 34 17 - 63 U/L   Alkaline Phosphatase 109 38 - 126 U/L   Total Bilirubin 0.5 0.3 - 1.2 mg/dL   GFR calc non Af Amer >60 >60 mL/min   GFR calc Af Amer >60 >60 mL/min    Comment: (NOTE) The eGFR has been calculated using the CKD EPI equation. This calculation has not been validated in all clinical situations. eGFR's persistently <60 mL/min signify possible Chronic Kidney Disease.    Anion gap 13 5 - 15  CBC WITH DIFFERENTIAL     Status: Abnormal   Collection Time: 01/04/17  2:19 AM  Result Value Ref Range   WBC 19.1 (H) 4.0 - 10.5 K/uL   RBC 4.30 4.22 - 5.81 MIL/uL   Hemoglobin 12.0 (L) 13.0 - 17.0 g/dL   HCT 37.1 (L) 39.0 - 52.0 %   MCV 86.3 78.0 - 100.0 fL   MCH 27.9 26.0 - 34.0 pg   MCHC 32.3 30.0 - 36.0 g/dL   RDW 16.2 (H) 11.5 - 15.5 %   Platelets 360 150 - 400 K/uL   Neutrophils  Relative % 91 %   Neutro Abs 17.4 (H) 1.7 - 7.7 K/uL   Lymphocytes Relative 4 %   Lymphs Abs 0.8 0.7 - 4.0 K/uL   Monocytes Relative 4 %   Monocytes Absolute 0.8 0.1 - 1.0 K/uL  Eosinophils Relative 1 %   Eosinophils Absolute 0.1 0.0 - 0.7 K/uL   Basophils Relative 0 %   Basophils Absolute 0.0 0.0 - 0.1 K/uL  Troponin I     Status: Abnormal   Collection Time: 01/04/17  2:19 AM  Result Value Ref Range   Troponin I 0.04 (HH) <0.03 ng/mL    Comment: CRITICAL RESULT CALLED TO, READ BACK BY AND VERIFIED WITH: CHRISCO C,RN 01/04/17 0311 WAYK   Brain natriuretic peptide     Status: None   Collection Time: 01/04/17  2:19 AM  Result Value Ref Range   B Natriuretic Peptide 63.7 0.0 - 100.0 pg/mL  Urinalysis, Routine w reflex microscopic     Status: Abnormal   Collection Time: 01/04/17  2:28 AM  Result Value Ref Range   Color, Urine AMBER (A) YELLOW    Comment: BIOCHEMICALS MAY BE AFFECTED BY COLOR   APPearance CLOUDY (A) CLEAR   Specific Gravity, Urine 1.017 1.005 - 1.030   pH 9.0 (H) 5.0 - 8.0   Glucose, UA NEGATIVE NEGATIVE mg/dL   Hgb urine dipstick NEGATIVE NEGATIVE   Bilirubin Urine NEGATIVE NEGATIVE   Ketones, ur NEGATIVE NEGATIVE mg/dL   Protein, ur NEGATIVE NEGATIVE mg/dL   Nitrite POSITIVE (A) NEGATIVE   Leukocytes, UA MODERATE (A) NEGATIVE  Urinalysis, Microscopic (reflex)     Status: Abnormal   Collection Time: 01/04/17  2:28 AM  Result Value Ref Range   RBC / HPF NONE SEEN 0 - 5 RBC/hpf   WBC, UA 0-5 0 - 5 WBC/hpf   Bacteria, UA RARE (A) NONE SEEN   Squamous Epithelial / LPF 0-5 (A) NONE SEEN   Triple Phosphate Crystal PRESENT   I-Stat venous blood gas, ED     Status: Abnormal   Collection Time: 01/04/17  2:30 AM  Result Value Ref Range   pH, Ven 7.328 7.250 - 7.430   pCO2, Ven 61.7 (H) 44.0 - 60.0 mmHg   pO2, Ven 34.0 32.0 - 45.0 mmHg   Bicarbonate 32.3 (H) 20.0 - 28.0 mmol/L   TCO2 34 0 - 100 mmol/L   O2 Saturation 59.0 %   Acid-Base Excess 5.0 (H) 0.0 - 2.0  mmol/L   Patient temperature 98.9 F    Collection site RADIAL, ALLEN'S TEST ACCEPTABLE    Drawn by Operator    Sample type VENOUS    Comment NOTIFIED PHYSICIAN   I-Stat CG4 Lactic Acid, ED  (not at  [facility])     Status: Abnormal   Collection Time: 01/04/17  2:31 AM  Result Value Ref Range   Lactic Acid, Venous 3.31 (HH) 0.5 - 1.9 mmol/L   Comment NOTIFIED PHYSICIAN   I-Stat CG4 Lactic Acid, ED  (not at  [facility])     Status: Abnormal   Collection Time: 01/04/17  5:34 AM  Result Value Ref Range   Lactic Acid, Venous 2.21 (HH) 0.5 - 1.9 mmol/L   Comment NOTIFIED PHYSICIAN   MRSA PCR Screening     Status: Abnormal   Collection Time: 01/04/17  6:14 AM  Result Value Ref Range   MRSA by PCR POSITIVE (A) NEGATIVE    Comment:        The GeneXpert MRSA Assay (FDA approved for NASAL specimens only), is one component of a comprehensive MRSA colonization surveillance program. It is not intended to diagnose MRSA infection nor to guide or monitor treatment for MRSA infections. RESULT CALLED TO, READ BACK BY AND VERIFIED WITH: Ferrel Logan RN 9:35 01/04/17 ([email or web address])  Lactic acid, plasma     Status: None   Collection Time: 01/04/17  7:36 AM  Result Value Ref Range   Lactic Acid, Venous 1.7 0.5 - 1.9 mmol/L  Troponin I (q 6hr x 3)     Status: Abnormal   Collection Time: 01/04/17  7:36 AM  Result Value Ref Range   Troponin I 0.08 (HH) <0.03 ng/mL    Comment: CRITICAL VALUE NOTED.  VALUE IS CONSISTENT WITH PREVIOUSLY REPORTED AND CALLED VALUE.  HIV antibody (Routine Testing)     Status: None   Collection Time: 01/04/17  7:36 AM  Result Value Ref Range   HIV Screen 4th Generation wRfx Non Reactive Non Reactive    Comment: (NOTE) Performed At: [facility] [place], [place] [id number] Lindon Romp MD [id number]   Procalcitonin     Status: None   Collection Time: 01/04/17  7:36 AM  Result Value Ref Range   Procalcitonin 1.76 ng/mL    Comment:         Interpretation: PCT > 0.5 ng/mL and <= 2 ng/mL: Systemic infection (sepsis) is possible, but other conditions are known to elevate PCT as well. (NOTE)         ICU PCT Algorithm               Non ICU PCT Algorithm    ----------------------------     ------------------------------         PCT < 0.25 ng/mL                 PCT < 0.1 ng/mL     Stopping of antibiotics            Stopping of antibiotics       strongly encouraged.               strongly encouraged.    ----------------------------     ------------------------------       PCT level decrease by               PCT < 0.25 ng/mL       >= 80% from peak PCT       OR PCT 0.25 - 0.5 ng/mL          Stopping of antibiotics                                             encouraged.     Stopping of antibiotics           encouraged.    ----------------------------     ------------------------------       PCT level decrease by              PCT >= 0.25 ng/mL       < 80% from peak PCT        AND PCT >= 0.5 ng/mL             Continuing antibiotics                                              encouraged.       Continuing antibiotics            encouraged.    ----------------------------     ------------------------------  PCT level increase compared          PCT > 0.5 ng/mL         with peak PCT AND          PCT >= 0.5 ng/mL             Escalation of antibiotics                                          strongly encouraged.      Escalation of antibiotics        strongly encouraged.   Glucose, capillary     Status: None   Collection Time: 01/04/17  7:58 AM  Result Value Ref Range   Glucose-Capillary 91 65 - 99 mg/dL  Glucose, capillary     Status: None   Collection Time: 01/04/17 11:55 AM  Result Value Ref Range   Glucose-Capillary 95 65 - 99 mg/dL   Comment 1 Capillary Specimen    Comment 2 Notify RN    Comment 3 Document in Chart       Component Value Date/Time   SDES TRACHEAL ASPIRATE 06/04/2016 2328   SPECREQUEST NONE  06/04/2016 2328   CULT  06/04/2016 2328    ABUNDANT STENOTROPHOMONAS MALTOPHILIA ABUNDANT PSEUDOMONAS AERUGINOSA ABUNDANT ACINETOBACTER CALCOACETICUS/BAUMANNII COMPLEX Results Called to: J. LINDSAY RN, AT [phone] 06/09/16 BY D. VANHOOK MULTI-DRUG RESISTANT ORGANISM SEE SEPARATE REPORT FOR SENSITIVITY PANEL    REPTSTATUS 06/29/2016 FINAL 06/04/2016 2328   Ct Head Wo Contrast  Result Date: 01/04/2017 CLINICAL DATA:  56 year old male with altered mental status. EXAM: CT HEAD WITHOUT CONTRAST TECHNIQUE: Contiguous axial images were obtained from the base of the skull through the vertex without intravenous contrast. COMPARISON:  None. FINDINGS: Brain: There is no acute intracranial hemorrhage. Areas of low attenuation and encephalomalacia noted in the frontal lobes bilaterally and right greater left likely related to old infarcts. Right posterior parietal approach catheter along the sylvian fissure noted. There is mild generalized brain atrophy. There is mild dilatation of the ventricular system which may be related to volume loss or represent NPH. Direct comparison with prior images, if available, is recommended to evaluate for interval change. There is atrophy of the cerebellum. Vascular: No hyperdense vessel or unexpected calcification. Skull: Right frontoparietal craniotomy. No acute calvarial fracture. Sinuses/Orbits: Mucoperiosteal thickening of paranasal sinuses. No air-fluid levels. There is opacification of the mastoid air cells bilaterally, left greater right. Other: None IMPRESSION: 1. No acute intracranial hemorrhage. 2. Age-related atrophy. Mild dilatation of the ventricular system may be related to volume loss or represent NPH. Correlation with clinical exam recommended. Direct comparison with prior images, if available, recommended to evaluate for interval change. 3. 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 4. Right frontoparietal craniotomy and shunt catheter along the  right sylvian fissure. Electronically Signed   By: Anner Crete M.D.   On: 01/04/2017 04:37   Dg Chest Port 1 View  Result Date: 01/04/2017 CLINICAL DATA:  Respiratory failure EXAM: PORTABLE CHEST 1 VIEW COMPARISON:  Chest radiograph 06/12/2016 FINDINGS: The tip of the tracheostomy tube is just below the level of the clavicles. Cardiomediastinal contours are normal. Sequela of remote barium aspiration are again noted in the right lung. There are bilateral peribronchial opacities, likely mild pulmonary edema. There is no lobar consolidation. IMPRESSION: Tracheostomy tube tip just below the level of the clavicles and 5 cm above  the inferior margin of the carina. Bilateral peribronchial opacities, likely mild pulmonary edema. No large area of consolidation. Electronically Signed   By: Ulyses Jarred M.D.   On: 01/04/2017 03:06   Recent Results (from the past 240 hour(s))  MRSA PCR Screening     Status: Abnormal   Collection Time: 01/04/17  6:14 AM  Result Value Ref Range Status   MRSA by PCR POSITIVE (A) NEGATIVE Final    Comment:        The GeneXpert MRSA Assay (FDA approved for NASAL specimens only), is one component of a comprehensive MRSA colonization surveillance program. It is not intended to diagnose MRSA infection nor to guide or monitor treatment for MRSA infections. RESULT CALLED TO, READ BACK BY AND VERIFIED WITH: Ferrel Logan RN 9:35 01/04/17 ([email or web address])       01/04/2017, 2:30 PM     LOS: 0 days    Records and images were personally reviewed where available.

## 2017-01-04 NOTE — Progress Notes (Signed)
Pharmacy Antibiotic Note  Dean Graham is a 56 y.o. male admitted on 01/04/2017 with sepsis.  Pharmacy has been consulted for Merrem dosing - broadening from Zosyn due to history of MDR Acinetobacter, Stenotrophomonas, and Pseudomonas. Zyvox per MD dosing.   WBC is elevated at 19.1. LA normalized. Afebrile. Quadriplegic - SCr 0.73 (likely falsely low). Making good urine.    Plan: Merrem 1g IV q8h. Monitor UOP closely in setting of quadriplegic and difficult to interpret SCr.  Monitor clinical status. Monitor culture results and ability to narrow therapy.  If clinically worsens, may need ID consult   Weight: 175 lb (79.4 kg)  Temp (24hrs), Avg:98.1 F (36.7 C), Min:97.2 F (36.2 C), Max:98.9 F (37.2 C)   Recent Labs Lab 01/04/17 0219 01/04/17 0231 01/04/17 0534 01/04/17 0736  WBC 19.1*  --   --   --   CREATININE 0.73  --   --   --   LATICACIDVEN  --  3.31* 2.21* 1.7    CrCl cannot be calculated (Unknown ideal weight.).    Allergies  Allergen Reactions  . Ativan [Lorazepam] Other (See Comments)    unknown  . Azithromycin Other (See Comments)    Blisters all over body  . Ceftriaxone Other (See Comments)    Blisters all over body  . Vancomycin Other (See Comments)    Blisters all over body    Antimicrobials this admission:  Zyvox 4/27 >> Merrem 4/27 >> Zosyn 4/27 >>4/27  Dose adjustments this admission:   Microbiology results:  4/27 BCx:  4/27 UCx:  4/27 Sputum:   4/27 MRSA PCR: positive Thank you for allowing pharmacy to be a part of this patient's care.  [name], PharmD, BCPS Clinical Pharmacist Clinical phone 01/04/2017 until 3:30 PM- [phone] After hours, please call #[phone] 01/04/2017 11:48 AM

## 2017-01-04 NOTE — ED Provider Notes (Signed)
[facility] DEPT Provider Note   CSN: [id number] Arrival date & time: 01/04/17  0154  By signing my name below, I, Oleh Genin, attest that this documentation has been prepared under the direction and in the presence of Sherwood Gambler, MD. Electronically Signed: Oleh Genin, Scribe. 01/04/17. 2:14 AM.   History   Chief Complaint Chief Complaint  Patient presents with  . Unresponsive  . Aspiration   LEVEL 5 CAVEAT DUE TO ALTERED MENTAL STATUS  HPI Dean Graham is a 56 y.o. 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 According to medic report, at 2340 the patient was witnessed to be draining fluid from his tracheostomy. No drainage from the circumference of the tracheostomy site. He was apneic at this time with nursing. They intubated at his living facility. No chest compressions. Transported to this facility. He is GCS of 3 with transporters. They report that his baseline mental status is involuntary movements in the upper extremities and ability to answer yes/no questions by shaking head.   A complete history is limited by altered mental status.  The history is provided by the EMS personnel and the nursing home. No language interpreter was used.    Past Medical History:  Diagnosis Date  . Atrial fibrillation ([place])   . CHF (congestive heart failure) ([place])   . Chronic respiratory failure ([place])   . Diabetes mellitus without complication ([place])   . Diastolic heart failure ([place])   . Dysphagia   . GERD (gastroesophageal reflux disease)   . Quadriplegia ([place])   . Seizures [facility])     Patient Active Problem List   Diagnosis Date Noted  . Aspiration pneumonia ([place]) 01/04/2017  . Anasarca   . Bleeding   . Tracheostomy care ([place])   . Hypoalbuminemia   . Cough with frothy sputum   . VAP  (ventilator-associated pneumonia) ([place])   . Carbapenem-resistant Acinetobacter baumannii infection   . History of infection by MDR Stenotrophomonas maltophilia   . History of MDR Pseudomonas aeruginosa infection   . Chronic post traumatic encephalopathy   . Acute encephalopathy   . Hypokalemia   . Hypomagnesemia   . Urinary tract infection, site not specified   . Acute on chronic respiratory failure with hypoxia ([place])   . Dysphagia   . Decubitus ulcer of sacral area   . Encounter for wound care   . Protein-calorie malnutrition, severe ([place])   . Palliative care encounter   . Goals of care, counseling/discussion   . Pressure injury of skin 05/31/2016  . Quadriplegia ([place]) 05/31/2016  . Atrial fibrillation ([place]) 05/31/2016  . Diabetes mellitus without complication ([place]) 26/94/8546  . Chronic respiratory failure ([place]) 05/31/2016  . 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 Septic shock ([place]) 05/29/2016  . HCAP (healthcare-associated pneumonia)     History reviewed. No pertinent surgical history.     Home Medications    Prior to Admission medications   Medication Sig Start Date End Date Taking? Authorizing Provider  acetaminophen (TYLENOL) 325 MG tablet Place 2 tablets (650 mg total) into feeding tube every 6 (six) hours as needed for mild pain, moderate pain or fever. 06/18/16  Yes Modena Jansky, MD  lidocaine (XYLOCAINE) 2 % solution Use as directed 20 mLs in the mouth or throat as needed for mouth pain.   Yes Historical Provider, MD  albuterol (PROVENTIL) (2.5 MG/3ML) 0.083% nebulizer  solution Take 2.5 mg by nebulization every 6 (six) hours as needed for wheezing or shortness of breath.    Historical Provider, MD  amiodarone (PACERONE) 100 MG tablet Place 100 mg into feeding tube daily. 08/26/15   Historical Provider, MD  chlorhexidine (PERIDEX) 0.12 % solution Use as directed 15 mLs in the mouth or throat 2 (two) times daily.    Historical Provider, MD  famotidine (PEPCID) 20 MG  tablet Place 1 tablet (20 mg total) into feeding tube 2 (two) times daily. 06/18/16   Modena Jansky, MD  furosemide (LASIX) 40 MG tablet Place 40 mg into feeding tube daily.    Historical Provider, MD  hydroxypropyl methylcellulose / hypromellose (ISOPTO TEARS / GONIOVISC) 2.5 % ophthalmic solution Place 1 drop into both eyes as needed for dry eyes.    Historical Provider, MD  Lacosamide (VIMPAT) 100 MG TABS Place 1 tablet (100 mg total) into feeding tube 2 (two) times daily. 06/18/16   Modena Jansky, MD  levETIRAcetam (KEPPRA) 100 MG/ML solution Place 5 mLs (500 mg total) into feeding tube 2 (two) times daily. 06/18/16   Modena Jansky, MD  loperamide (IMODIUM) 1 MG/5ML solution Place 5 mLs (1 mg total) into feeding tube 2 (two) times daily as needed for diarrhea or loose stools. 06/18/16   Modena Jansky, MD  midodrine (PROAMATINE) 10 MG tablet Place 10 mg into feeding tube every 8 (eight) hours. 05/25/16   Historical Provider, MD  Multiple Vitamin (MULTIVITAMIN WITH MINERALS) TABS tablet Place 1 tablet into feeding tube daily. 06/18/16   Modena Jansky, MD  Nutritional Supplements (FEEDING SUPPLEMENT, VITAL AF 1.2 CAL,) LIQD Place 1,000 mLs into feeding tube continuous. 1,000 mL, Per Tube, at 70 mL/hr, Continuous 06/18/16   Modena Jansky, MD  saccharomyces boulardii (FLORASTOR) 250 MG capsule Take 1 capsule (250 mg total) by mouth 2 (two) times daily. Via Tube. 06/18/16   Modena Jansky, MD  vitamin C (ASCORBIC ACID) 500 MG tablet Place 1 tablet (500 mg total) into feeding tube daily. 06/18/16   Modena Jansky, MD  Water For Irrigation, Sterile (FREE WATER) SOLN Place 200 mLs into feeding tube every 8 (eight) hours. 06/18/16   Modena Jansky, MD    Family History No family history on file.  Social History Social History  Substance Use Topics  . Smoking status: Unknown If Ever Smoked  . Smokeless tobacco: Not on file  . Alcohol use No     Allergies   Ativan [lorazepam];  Azithromycin; Ceftriaxone; and Vancomycin   Review of Systems Review of Systems  Unable to perform ROS: Mental status change     Physical Exam Updated Vital Signs BP 99/62   Pulse (!) 101   Temp 98.9 F (37.2 C) (Rectal)   Resp 16   Wt 175 lb (79.4 kg)   SpO2 98%   BMI 25.11 kg/m   Physical Exam  Constitutional: He appears well-developed and well-nourished.  Contractures in the bilateral lower extremities.   HENT:  Head: Normocephalic and atraumatic.  Right Ear: External ear normal.  Left Ear: External ear normal.  Nose: Nose normal.  Eyes: Right eye exhibits no discharge. Left eye exhibits no discharge.  Neck: Neck supple.  No active drainage from trach site.   Cardiovascular: Regular rhythm and normal heart sounds.  Tachycardia present.   Pulmonary/Chest: Effort normal.  On the ventilator with coarse BS diffusely.   Abdominal: Soft. There is no tenderness.  Musculoskeletal: He exhibits no  edema.  Neurological:  Unresponsive. Random movements but no response to pain.   Skin: Skin is warm and dry.  Nursing note and vitals reviewed.    ED Treatments / Results  Labs (all labs ordered are listed, but only abnormal results are displayed) Labs Reviewed  COMPREHENSIVE METABOLIC PANEL - Abnormal; Notable for the following:       Result Value   Sodium 134 (*)    Chloride 94 (*)    Glucose, Bld 196 (*)    BUN 24 (*)    Total Protein 9.2 (*)    AST 53 (*)    All other components within normal limits  CBC WITH DIFFERENTIAL/PLATELET - Abnormal; Notable for the following:    WBC 19.1 (*)    Hemoglobin 12.0 (*)    HCT 37.1 (*)    RDW 16.2 (*)    Neutro Abs 17.4 (*)    All other components within normal limits  URINALYSIS, ROUTINE W REFLEX MICROSCOPIC - Abnormal; Notable for the following:    Color, Urine AMBER (*)    APPearance CLOUDY (*)    pH 9.0 (*)    Nitrite POSITIVE (*)    Leukocytes, UA MODERATE (*)    All other components within normal limits  TROPONIN  I - Abnormal; Notable for the following:    Troponin I 0.04 (*)    All other components within normal limits  URINALYSIS, MICROSCOPIC (REFLEX) - Abnormal; Notable for the following:    Bacteria, UA RARE (*)    Squamous Epithelial / LPF 0-5 (*)    All other components within normal limits  I-STAT CG4 LACTIC ACID, ED - Abnormal; Notable for the following:    Lactic Acid, Venous 3.31 (*)    All other components within normal limits  I-STAT VENOUS BLOOD GAS, ED - Abnormal; Notable for the following:    pCO2, Ven 61.7 (*)    Bicarbonate 32.3 (*)    Acid-Base Excess 5.0 (*)    All other components within normal limits  I-STAT CG4 LACTIC ACID, ED - Abnormal; Notable for the following:    Lactic Acid, Venous 2.21 (*)    All other components within normal limits  CULTURE, BLOOD (ROUTINE X 2)  CULTURE, BLOOD (ROUTINE X 2)  CULTURE, RESPIRATORY (NON-EXPECTORATED)  URINE CULTURE  MRSA PCR SCREENING  BRAIN NATRIURETIC PEPTIDE  LACTIC ACID, PLASMA  TROPONIN I  TROPONIN I  TROPONIN I  HIV ANTIBODY (ROUTINE TESTING)  STREP PNEUMONIAE URINARY ANTIGEN  LEGIONELLA PNEUMOPHILA SEROGP 1 UR AG  PROCALCITONIN    EKG  EKG Interpretation  Date/Time:  Friday January 04 2017 02:05:12 EDT Ventricular Rate:  131 PR Interval:    QRS Duration: 88 QT Interval:  314 QTC Calculation: 464 R Axis:   139 Text Interpretation:  Sinus tachycardia Probable left atrial enlargement Anterior ST elevation, probably due to LVH Baseline wander in lead(s) II III aVF V1 V5 no significant change since Sept 2017 Confirmed by Regenia Skeeter MD, Markes Shatswell [phone]) on 01/04/2017 2:16:16 AM       Radiology Ct Head Wo Contrast  Result Date: 01/04/2017 CLINICAL DATA:  56 year old male with altered mental status. EXAM: CT HEAD WITHOUT CONTRAST TECHNIQUE: Contiguous axial images were obtained from the base of the skull through the vertex without intravenous contrast. COMPARISON:  None. FINDINGS: Brain: There is no acute intracranial  hemorrhage. Areas of low attenuation and encephalomalacia noted in the frontal lobes bilaterally and right greater left likely related to old infarcts. Right posterior parietal approach catheter  along the sylvian fissure noted. There is mild generalized brain atrophy. There is mild dilatation of the ventricular system which may be related to volume loss or represent NPH. Direct comparison with prior images, if available, is recommended to evaluate for interval change. There is atrophy of the cerebellum. Vascular: No hyperdense vessel or unexpected calcification. Skull: Right frontoparietal craniotomy. No acute calvarial fracture. Sinuses/Orbits: Mucoperiosteal thickening of paranasal sinuses. No air-fluid levels. There is opacification of the mastoid air cells bilaterally, left greater right. Other: None IMPRESSION: 1. No acute intracranial hemorrhage. 2. Age-related atrophy. Mild dilatation of the ventricular system may be related to volume loss or represent NPH. Correlation with clinical exam recommended. Direct comparison with prior images, if available, recommended to evaluate for interval change. 3. 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 4. Right frontoparietal craniotomy and shunt catheter along the right sylvian fissure. Electronically Signed   By: Anner Crete M.D.   On: 01/04/2017 04:37   Dg Chest Port 1 View  Result Date: 01/04/2017 CLINICAL DATA:  Respiratory failure EXAM: PORTABLE CHEST 1 VIEW COMPARISON:  Chest radiograph 06/12/2016 FINDINGS: The tip of the tracheostomy tube is just below the level of the clavicles. Cardiomediastinal contours are normal. Sequela of remote barium aspiration are again noted in the right lung. There are bilateral peribronchial opacities, likely mild pulmonary edema. There is no lobar consolidation. IMPRESSION: Tracheostomy tube tip just below the level of the clavicles and 5 cm above the inferior margin of the carina. Bilateral  peribronchial opacities, likely mild pulmonary edema. No large area of consolidation. Electronically Signed   By: Ulyses Jarred M.D.   On: 01/04/2017 03:06    Procedures Procedures (including critical care time)  Medications Ordered in ED Medications  linezolid (ZYVOX) IVPB 600 mg (0 mg Intravenous Stopped 01/04/17 0425)  0.9 %  sodium chloride infusion (not administered)  heparin injection 5,000 Units (not administered)  pantoprazole (PROTONIX) injection 40 mg (not administered)  amiodarone (PACERONE) tablet 100 mg (not administered)  Lacosamide TABS 100 mg (not administered)  levETIRAcetam (KEPPRA) 100 MG/ML solution 500 mg (not administered)  midodrine (PROAMATINE) tablet 10 mg (not administered)  albuterol (PROVENTIL) (2.5 MG/3ML) 0.083% nebulizer solution 2.5 mg (not administered)  0.9 %  sodium chloride infusion ( Intravenous New Bag/Given 01/04/17 0758)  piperacillin-tazobactam (ZOSYN) IVPB 3.375 g (not administered)  insulin aspart (novoLOG) injection 2-6 Units (2 Units Subcutaneous Not Given 01/04/17 0758)  chlorhexidine gluconate (MEDLINE KIT) (PERIDEX) 0.12 % solution 15 mL (15 mLs Mouth Rinse Given 01/04/17 0758)  MEDLINE mouth rinse (not administered)  sodium chloride 0.9 % bolus 1,000 mL (0 mLs Intravenous Stopped 01/04/17 0326)  meropenem (MERREM) 1 g in sodium chloride 0.9 % 100 mL IVPB (0 g Intravenous Stopped 01/04/17 0400)  sodium chloride 0.9 % bolus 1,000 mL (0 mLs Intravenous Stopped 01/04/17 0520)     Initial Impression / Assessment and Plan / ED Course  I have reviewed the triage vital signs and the nursing notes.  Pertinent labs & imaging results that were available during my care of the patient were reviewed by me and considered in my medical decision making (see chart for details).     Patient appears to likely have aspiration pneumonia given the clinical history. He is not febrile but otherwise has concerning symptoms for possible sepsis with tachycardia,  lactic acidosis, and borderline blood pressures. His mental status is worse than typical although this has somewhat improved where he is now opening his eyes after fluids. Discussed with  his mom, she wants all treatments done except for CPR. At this point will give IV antibiotics, fluids. Given he is on the ventilator now, will admit to the ICU.  Final Clinical Impressions(s) / ED Diagnoses   Final diagnoses:  Aspiration pneumonia, unspecified aspiration pneumonia type, unspecified laterality, unspecified part of lung [facility])    New Prescriptions Current Discharge Medication List     I personally performed the services described in this documentation, which was scribed in my presence. The recorded information has been reviewed and is accurate.    Sherwood Gambler, MD 01/04/17 [phone]

## 2017-01-04 NOTE — ED Notes (Signed)
Patient transported to CT scan with RT and RN.

## 2017-01-04 NOTE — Progress Notes (Signed)
Patient removed trach. 8  Cuffed shiley was placed. Patient tolerated well. Vital signs stable at this time. RT will continue to monitor.

## 2017-01-04 NOTE — ED Notes (Signed)
Portable xray at bedside.

## 2017-01-04 NOTE — Progress Notes (Addendum)
eLink Physician-Brief Progress Note Patient Name: Dean Graham DOB: 24-Apr-1961 MRN: [id number]   Date of Service  01/04/2017  HPI/Events of Note  Transient seizure activity reported by nurse Pt appears seizure free on cam check  eICU Interventions  Continue keppra. Check levels Start home vimpat     Intervention Category Intermediate Interventions: Other:  Lyman Balingit 01/04/2017, 6:56 PM

## 2017-01-04 NOTE — Consult Note (Addendum)
WOC Nurse wound consult note Reason for Consult: Consult requested for right ankle and left ischium.  Pt has scar tissue in several locations where wounds have healed.  Wound type: Right inner ankle with stage 2 pressure injury; 1X1X.1cm, pink and moist Left ischium with stage 4 pressure injury; 2X4X.3cm, bone visible, wound beefy red with mod amt tan drainage, no odor Pressure Injury POA: Yes Dressing procedure/placement/frequency: Pt is on a Sport low air loss bed to reduce pressure.  Foam dressing to protect and promote healing to inner ankle.  Aquacel to absorb drainage and provide antimicrobial benefits to left ischium.  No family present to discuss plan of care. Please re-consult if further assistance is needed.  Thank-you,  Cammie Mcgee MSN, RN, CWOCN, [place], CNS [phone]

## 2017-01-04 NOTE — Progress Notes (Signed)
Pharmacy Antibiotic Note  Dean Graham is a 56 y.o. male admitted on 01/04/2017 with sepsis.  Pharmacy has been consulted for Merrem dosing. ED provider wishes to cover for rule out sepsis and aspiration PNA. WBC is elevated at 19.1. Pt from [facility] with trach in place. Renal function ok.   Plan: -Zyvox 600 mg IV q12h-will need ID approval for continued use -Merrem 1g IV q8h -Trend WBC, temp, renal function  -F/U infectious work-up  Weight: 175 lb (79.4 kg)  Temp (24hrs), Avg:98.9 F (37.2 C), Min:98.9 F (37.2 C), Max:98.9 F (37.2 C)   Recent Labs Lab 01/04/17 0219 01/04/17 0231  WBC 19.1*  --   CREATININE 0.73  --   LATICACIDVEN  --  3.31*    CrCl cannot be calculated (Unknown ideal weight.).    Allergies  Allergen Reactions  . Ativan [Lorazepam]   . Azithromycin Other (See Comments)    Blisters all over body  . Ceftriaxone Other (See Comments)    Blisters all over body  . Vancomycin Other (See Comments)    Blisters all over body   Abran Duke 01/04/2017 3:48 AM

## 2017-01-05 ENCOUNTER — Encounter (HOSPITAL_COMMUNITY): Payer: Self-pay

## 2017-01-05 ENCOUNTER — Inpatient Hospital Stay (HOSPITAL_COMMUNITY): Payer: Medicare Other

## 2017-01-05 DIAGNOSIS — Z931 Gastrostomy status: Secondary | ICD-10-CM | POA: Diagnosis not present

## 2017-01-05 DIAGNOSIS — Z930 Tracheostomy status: Secondary | ICD-10-CM | POA: Diagnosis not present

## 2017-01-05 DIAGNOSIS — D72829 Elevated white blood cell count, unspecified: Secondary | ICD-10-CM | POA: Diagnosis not present

## 2017-01-05 DIAGNOSIS — J690 Pneumonitis due to inhalation of food and vomit: Principal | ICD-10-CM

## 2017-01-05 DIAGNOSIS — Z9911 Dependence on respirator [ventilator] status: Secondary | ICD-10-CM

## 2017-01-05 DIAGNOSIS — R509 Fever, unspecified: Secondary | ICD-10-CM | POA: Diagnosis not present

## 2017-01-05 DIAGNOSIS — R569 Unspecified convulsions: Secondary | ICD-10-CM

## 2017-01-05 DIAGNOSIS — Z8619 Personal history of other infectious and parasitic diseases: Secondary | ICD-10-CM

## 2017-01-05 LAB — GLUCOSE, CAPILLARY
GLUCOSE-CAPILLARY: 98 mg/dL (ref 65–99)
GLUCOSE-CAPILLARY: 130 mg/dL — AB (ref 65–99)
Glucose-Capillary: 95 mg/dL (ref 65–99)
Glucose-Capillary: 103 mg/dL — ABNORMAL HIGH (ref 65–99)

## 2017-01-05 LAB — BASIC METABOLIC PANEL
ANION GAP: 9 (ref 5–15)
BUN: 17 mg/dL (ref 6–20)
CALCIUM: 9.1 mg/dL (ref 8.9–10.3)
CO2: 25 mmol/L (ref 22–32)
CREATININE: 0.59 mg/dL — AB (ref 0.61–1.24)
Chloride: 105 mmol/L (ref 101–111)
GLUCOSE: 112 mg/dL — AB (ref 65–99)
Potassium: 4.1 mmol/L (ref 3.5–5.1)
Sodium: 139 mmol/L (ref 135–145)

## 2017-01-05 LAB — URINE CULTURE

## 2017-01-05 LAB — CBC
HCT: 28.9 % — ABNORMAL LOW (ref 39.0–52.0)
HEMOGLOBIN: 9.2 g/dL — AB (ref 13.0–17.0)
MCH: 27.3 pg (ref 26.0–34.0)
MCHC: 31.8 g/dL (ref 30.0–36.0)
MCV: 85.8 fL (ref 78.0–100.0)
PLATELETS: 260 10*3/uL (ref 150–400)
RBC: 3.37 MIL/uL — ABNORMAL LOW (ref 4.22–5.81)
RDW: 16.5 % — ABNORMAL HIGH (ref 11.5–15.5)
WBC: 9.9 10*3/uL (ref 4.0–10.5)

## 2017-01-05 LAB — MAGNESIUM: MAGNESIUM: 1.7 mg/dL (ref 1.7–2.4)

## 2017-01-05 LAB — PHOSPHORUS: PHOSPHORUS: 1.5 mg/dL — AB (ref 2.5–4.6)

## 2017-01-05 LAB — PROCALCITONIN: PROCALCITONIN: 2.17 ng/mL

## 2017-01-05 MED ORDER — ALBUTEROL SULFATE (2.5 MG/3ML) 0.083% IN NEBU: 2.5000 mg | INHALATION_SOLUTION | RESPIRATORY_TRACT | Status: DC | PRN

## 2017-01-05 MED ORDER — MIDAZOLAM HCL 2 MG/2ML IJ SOLN
2.0000 mg | INTRAMUSCULAR | Status: DC | PRN
  Administered 2017-01-05 – 2017-01-11 (×10): 2 mg via INTRAVENOUS
  Filled 2017-01-05 (×11): qty 2

## 2017-01-05 MED ORDER — LEVETIRACETAM 100 MG/ML PO SOLN
1000.0000 mg | Freq: Two times a day (BID) | ORAL | Status: DC
  Administered 2017-01-05: 1000 mg
  Filled 2017-01-05 (×2): qty 10

## 2017-01-05 MED ORDER — PANTOPRAZOLE SODIUM 40 MG PO PACK
40.0000 mg | PACK | Freq: Every day | ORAL | Status: DC
  Administered 2017-01-05: 40 mg
  Filled 2017-01-05 (×2): qty 20

## 2017-01-05 NOTE — Progress Notes (Addendum)
TEAM 1 - Stepdown/ICU TEAM  Dean Graham  GMW:[id number] DOB: 1961/08/22 DOA: 01/04/2017 PCP: Verneita Griffes, MD    Brief Narrative:  56 year old quadriplegic male s/p traumatic brain injury as a child with Hx of A.Fib, Diastolic CHF, Chronic Respiratory Failure s/p tracheostomy (on and off vent), DM, Dysphagia, GERD, and seizures. He was ventilator dependent after his initial TBI for afew years then he did not require vent support until 2-3 years ago when he needed to be intubated.  He has been vent dependent since (able to tolerate TC at time recently). He was transferred from [facility] on 4/27 after being found unresponsive and apneic with brown/tan secretions through trach site.  He was bagged for a couple of minutes and placed on the vent. Upon arrival to ED patient was a GCS of 3, baseline mental status is involuntary movements in the upper extremities and ability to answer yes/no questions. CT Head Negative. CXR with bilateral peribronchial opacities.   Significant Events: 4/27 transfer to ED from [place] 4/28 [facility] assumed care   Subjective: The patient is sedate at the time of my visit having been given Versed due to seizures early this morning.  There is no evidence of respiratory distress or uncontrolled pain at this time.  There is no seizure activity at the time of my visit.  Assessment & Plan:  Acute on Chronic Ventilator Dependent Respiratory Failure s/p Tracheostomy +/-Aspiration Ongoing care of vent and trach per PCCM - pt removed his own trach on 4/27; it was replaced by PCCM w/ a #8 cuffed shiley  H/O MDR Stenotrophomonas maltophilia, MDR Pseudomonas aeruginosa  Antibiotics being directed by ID service  Chronic Hypotension  Continue home midodrine - stable at present  Chronic Diastolic Heart Failure  EF 65-70% via TTE Sept 2017 - dry weight appears to be approximately 72 kg Filed Weights   01/04/17 0240 01/04/17 1604 01/05/17 0500  Weight: 79.4 kg (175 lb)  79.4 kg (175 lb) 62.7 kg (138 lb 3.7 oz)    Chronic A.fib Continue amiodarone - normal sinus rhythm at time of my exam today  Neurogenic Bladder     Dysphagia s/p PEG   Anemia of Chronic Disease  Drop in hemoglobin since admission likely related to volume resuscitation - no evidence of acute blood loss - follow trend  DM2   Currently well-controlled  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 rate 20, height '5\' 10"'$  (1.778 m), weight 62.7 kg (138 lb 3.7 oz), SpO2 99 %.  Intake/Output Summary (Last 24 hours) at 01/05/17 0925 Last data filed at 01/05/17 0800  Gross per 24 hour  Intake           3588.5 ml  Output             1300 ml  Net           2288.5 ml  Filed Weights   01/04/17 0240 01/04/17 1604 01/05/17 0500  Weight: 79.4 kg (175 lb) 79.4 kg (175 lb) 62.7 kg (138 lb 3.7 oz)    Examination: General: No acute respiratory distress evident  Lungs: Clear to auscultation bilaterally without wheezes or crackles Cardiovascular: Regular rate and rhythm without murmur gallop or rub normal S1 and S2 Abdomen: Nondistended, soft, bowel sounds positive, no rebound, no ascites, no appreciable mass - PEG insertion clean and dry  Extremities: No significant cyanosis, clubbing, or edema bilateral lower extremities  CBC:  Recent Labs Lab 01/04/17 0219 01/05/17 0841  WBC 19.1* 9.9  NEUTROABS 17.4*   --   HGB 12.0* 9.2*  HCT 37.1* 28.9*  MCV 86.3 85.8  PLT 360 161   Basic Metabolic Panel:  Recent Labs Lab 01/04/17 0219  NA 134*  K 4.7  CL 94*  CO2 27  GLUCOSE 196*  BUN 24*  CREATININE 0.73  CALCIUM 10.0   GFR: Estimated Creatinine Clearance: 90.3 mL/min (by C-G formula based on SCr of 0.73 mg/dL).  Liver Function Tests:  Recent Labs Lab 01/04/17 0219  AST 53*  ALT 34  ALKPHOS 109  BILITOT 0.5  PROT 9.2*  ALBUMIN 4.0   Cardiac Enzymes:  Recent Labs Lab 01/04/17 0219 01/04/17 0736 01/04/17 1414 01/04/17 1932  TROPONINI 0.04* 0.08* 0.07* 0.06*    CBG:  Recent Labs Lab 01/04/17 1625 01/04/17 1947 01/04/17 2327 01/05/17 0318 01/05/17 0803  GLUCAP 103* 127* 122* 98 130*    Recent Results (from the past 240 hour(s))  Culture, respiratory (NON-Expectorated)     Status: None (Preliminary result)   Collection Time: 01/04/17  6:05 AM  Result Value Ref Range Status   Specimen Description TRACHEAL ASPIRATE  Final   Special Requests NONE  Final   Gram Stain   Final    ABUNDANT WBC PRESENT,BOTH PMN AND MONONUCLEAR FEW GRAM NEGATIVE RODS    Culture CULTURE REINCUBATED FOR BETTER GROWTH  Final   Report Status PENDING  Incomplete  MRSA PCR Screening     Status: Abnormal   Collection Time: 01/04/17  6:14 AM  Result Value Ref Range Status   MRSA by PCR POSITIVE (A) NEGATIVE Final    Comment:        The GeneXpert MRSA Assay (FDA approved for NASAL specimens only), is one component of a comprehensive MRSA colonization surveillance program. It is not intended to diagnose MRSA infection nor to guide or monitor treatment for MRSA infections. RESULT CALLED TO, READ BACK BY AND VERIFIED WITH: Ferrel Logan RN 9:35 01/04/17 ([email or web address])      Scheduled Meds: . amiodarone  100 mg Per Tube Daily  . chlorhexidine gluconate (MEDLINE KIT)  15 mL Mouth Rinse BID  . Chlorhexidine Gluconate Cloth  6 each Topical Q0600  . heparin  5,000 Units Subcutaneous Q8H  .  insulin aspart  2-6 Units Subcutaneous Q4H  . lacosamide  100 mg Per Tube BID  . levETIRAcetam  500 mg Per Tube BID  . mouth rinse  15 mL Mouth Rinse QID  . midodrine  10 mg Per Tube TID  . mupirocin ointment  1 application Nasal BID  . pantoprazole (PROTONIX) IV  40 mg Intravenous QHS   Continuous Infusions: . sodium chloride    . sodium chloride 75 mL/hr at 01/05/17 0800  . feeding supplement (VITAL AF 1.2 CAL) 1,000 mL (01/05/17 0800)  . linezolid (ZYVOX) IV 600 mg (01/05/17 0923)  . meropenem (MERREM) IV 1 g (01/05/17 0960)  LOS: 1 day   Cherene Altes, MD [facility] Office  [phone] Pager - Text Page per Amion as per below:  On-Call/Text Page:      [email or web address]      password [email or web address]  If 7PM-7AM, please contact night-coverage [email or web address] Password [email or web address] 01/05/2017, 9:25 AM

## 2017-01-05 NOTE — Progress Notes (Addendum)
Dean Graham for Infectious Disease    Date of Admission:  01/04/2017   Total days of antibiotics 3        Day 3 linezolid        Day 3 meropenem           ID: Dean Graham is a 56 y.o. male with TBI s/p peg, s/p trach, MDRO colonization Active Problems:   Aspiration pneumonia (HCC)   Subjective: tmax yesterday 100.52F, afebrile this morning. Hemodynamically stable. FiO2 at 40%, remains unresponsive at this baseline. Questionable seizure activity last night per review of his records of 24hr events. Leukocytosis improving  cxr per my read shows patchy infiltrate at bases bilaterally no effusions, slightly worse than yesterday  ROS: unable to obtain due to baseline neurologic insult  Medications:  . amiodarone  100 mg Per Tube Daily  . chlorhexidine gluconate (MEDLINE KIT)  15 mL Mouth Rinse BID  . Chlorhexidine Gluconate Cloth  6 each Topical Q0600  . heparin  5,000 Units Subcutaneous Q8H  . lacosamide  100 mg Per Tube BID  . levETIRAcetam  1,000 mg Per Tube BID  . mouth rinse  15 mL Mouth Rinse QID  . midodrine  10 mg Per Tube TID  . mupirocin ointment  1 application Nasal BID  . pantoprazole sodium  40 mg Per Tube Daily    Objective: Vital signs in last 24 hours: Temp:  [97.4 F (36.3 C)-100.8 F (38.2 C)] 99.2 F (37.3 C) (04/28 0802) Pulse Rate:  [87-124] 93 (04/28 0900) Resp:  [15-31] 20 (04/28 0900) BP: (82-205)/(52-165) 105/76 (04/28 0900) SpO2:  [82 %-100 %] 99 % (04/28 0900) FiO2 (%):  [40 %] 40 % (04/28 0900) Weight:  [138 lb 3.7 oz (62.7 kg)-175 lb (79.4 kg)] 138 lb 3.7 oz (62.7 kg) (04/28 0500)  Physical Exam  Constitutional: remains obtunded. No distress.  HENT:  Mouth/Throat: trach in place no secretions Cardiovascular: Normal rate, regular rhythm and normal heart sounds. Exam reveals no gallop and no friction rub.  No murmur heard.  Pulmonary/Chest: Effort normal and breath sounds normal. No respiratory distress. He has no wheezes.    Abdominal: Soft. Bowel sounds are normal. He exhibits no distension. There is no tenderness. Peg in place Neurological: pinpoint pupils, no response to verbal stimuli.  Skin: Skin is warm and dry. No rash noted. No erythema.    Lab Results  Recent Labs  01/04/17 0219 01/05/17 0841  WBC 19.1* 9.9  HGB 12.0* 9.2*  HCT 37.1* 28.9*  NA 134* 139  K 4.7 4.1  CL 94* 105  CO2 27 25  BUN 24* 17  CREATININE 0.73 0.59*   Liver Panel  Recent Labs  01/04/17 0219  PROT 9.2*  ALBUMIN 4.0  AST 53*  ALT 34  ALKPHOS 109  BILITOT 0.5   Sedimentation Rate No results for input(s): ESRSEDRATE in the last 72 hours. C-Reactive Protein No results for input(s): CRP in the last 72 hours.  Microbiology:  Studies/Results: Ct Head Wo Contrast  Result Date: 01/04/2017 CLINICAL DATA:  56 year old male with altered mental status. EXAM: CT HEAD WITHOUT CONTRAST TECHNIQUE: Contiguous axial images were obtained from the base of the skull through the vertex without intravenous contrast. COMPARISON:  None. FINDINGS: Brain: There is no acute intracranial hemorrhage. Areas of low attenuation and encephalomalacia noted in the frontal lobes bilaterally and right greater left likely related to old infarcts. Right posterior parietal approach catheter along the sylvian fissure noted. There is mild generalized  brain atrophy. There is mild dilatation of the ventricular system which may be related to volume loss or represent NPH. Direct comparison with prior images, if available, is recommended to evaluate for interval change. There is atrophy of the cerebellum. Vascular: No hyperdense vessel or unexpected calcification. Skull: Right frontoparietal craniotomy. No acute calvarial fracture. Sinuses/Orbits: Mucoperiosteal thickening of paranasal sinuses. No air-fluid levels. There is opacification of the mastoid air cells bilaterally, left greater right. Other: None IMPRESSION: 1. No acute intracranial hemorrhage. 2.  Age-related atrophy. Mild dilatation of the ventricular system may be related to volume loss or represent NPH. Correlation with clinical exam recommended. Direct comparison with prior images, if available, recommended to evaluate for interval change. 3. 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 4. Right frontoparietal craniotomy and shunt catheter along the right sylvian fissure. Electronically Signed   By: Anner Crete M.D.   On: 01/04/2017 04:37   Dg Chest Port 1 View  Result Date: 01/05/2017 CLINICAL DATA:  Ventilator dependent. EXAM: PORTABLE CHEST 1 VIEW COMPARISON:  01/04/2017. FINDINGS: Cardiac enlargement. Tracheostomy apparent good position. Marked worsening aeration with decreased lung volumes, and bibasilar opacities representing atelectasis, possible infiltrate, and effusions. No pneumothorax. IMPRESSION: Marked worsening aeration. BILATERAL lung opacities have progressed since yesterday's radiograph; concern raised for developing pneumonia. Electronically Signed   By: Staci Righter M.D.   On: 01/05/2017 07:46   Dg Chest Port 1 View  Result Date: 01/04/2017 CLINICAL DATA:  Respiratory failure EXAM: PORTABLE CHEST 1 VIEW COMPARISON:  Chest radiograph 06/12/2016 FINDINGS: The tip of the tracheostomy tube is just below the level of the clavicles. Cardiomediastinal contours are normal. Sequela of remote barium aspiration are again noted in the right lung. There are bilateral peribronchial opacities, likely mild pulmonary edema. There is no lobar consolidation. IMPRESSION: Tracheostomy tube tip just below the level of the clavicles and 5 cm above the inferior margin of the carina. Bilateral peribronchial opacities, likely mild pulmonary edema. No large area of consolidation. Electronically Signed   By: Ulyses Jarred M.D.   On: 01/04/2017 03:06     Assessment/Plan: Aspiration pneumonia = continue on linezolid and meropenem for now appears improving with being afebrile  and leukocytosis improving. Would recommend not to change abtx at this time. Await culture results. CXR changes on today's x-ray could reflect pulmonary edema. I have contacted micro and no new data available at this time to narrow abtx  Leukocytosis = improving  Fever = appears improving. If he spikes fever again today, would repeat blood cultures.  [facility]ax[facility] Flattery [facility] for Infectious Diseases Cell: [phone] Pager: [phone]  01/05/2017, 10:19 AM

## 2017-01-06 ENCOUNTER — Encounter (HOSPITAL_COMMUNITY): Payer: Self-pay | Admitting: *Deleted

## 2017-01-06 DIAGNOSIS — K9423 Gastrostomy malfunction: Secondary | ICD-10-CM

## 2017-01-06 DIAGNOSIS — J690 Pneumonitis due to inhalation of food and vomit: Secondary | ICD-10-CM | POA: Diagnosis not present

## 2017-01-06 DIAGNOSIS — Z9911 Dependence on respirator [ventilator] status: Secondary | ICD-10-CM | POA: Diagnosis not present

## 2017-01-06 LAB — CBC
HCT: 27 % — ABNORMAL LOW (ref 39.0–52.0)
Hemoglobin: 8.5 g/dL — ABNORMAL LOW (ref 13.0–17.0)
MCH: 27.1 pg (ref 26.0–34.0)
MCHC: 31.5 g/dL (ref 30.0–36.0)
MCV: 86 fL (ref 78.0–100.0)
PLATELETS: 254 10*3/uL (ref 150–400)
RBC: 3.14 MIL/uL — ABNORMAL LOW (ref 4.22–5.81)
RDW: 16.6 % — AB (ref 11.5–15.5)
WBC: 9.4 10*3/uL (ref 4.0–10.5)

## 2017-01-06 LAB — COMPREHENSIVE METABOLIC PANEL
ALBUMIN: 2.8 g/dL — AB (ref 3.5–5.0)
ALK PHOS: 67 U/L (ref 38–126)
ALT: 22 U/L (ref 17–63)
AST: 33 U/L (ref 15–41)
Anion gap: 7 (ref 5–15)
BILIRUBIN TOTAL: 0.7 mg/dL (ref 0.3–1.2)
BUN: 20 mg/dL (ref 6–20)
CO2: 25 mmol/L (ref 22–32)
CREATININE: 0.52 mg/dL — AB (ref 0.61–1.24)
Calcium: 8.8 mg/dL — ABNORMAL LOW (ref 8.9–10.3)
Chloride: 110 mmol/L (ref 101–111)
GFR calc Af Amer: >60 mL/min (ref >60)
GFR calc non Af Amer: >60 mL/min (ref >60)
GLUCOSE: 106 mg/dL — AB (ref 65–99)
Potassium: 4.1 mmol/L (ref 3.5–5.1)
SODIUM: 142 mmol/L (ref 135–145)
TOTAL PROTEIN: 6.3 g/dL — AB (ref 6.5–8.1)

## 2017-01-06 LAB — PROCALCITONIN: Procalcitonin: 1.43 ng/mL

## 2017-01-06 MED ORDER — SODIUM CHLORIDE 0.9 % IV SOLN
1000.0000 mg | Freq: Two times a day (BID) | INTRAVENOUS | Status: DC
  Administered 2017-01-06 – 2017-01-11 (×11): 1000 mg via INTRAVENOUS
  Filled 2017-01-06 (×12): qty 10

## 2017-01-06 MED ORDER — SODIUM CHLORIDE 0.9 % IV SOLN
100.0000 mg | Freq: Two times a day (BID) | INTRAVENOUS | Status: DC
  Administered 2017-01-06 – 2017-01-11 (×11): 100 mg via INTRAVENOUS
  Filled 2017-01-06 (×20): qty 10

## 2017-01-06 MED ORDER — KCL IN DEXTROSE-NACL 20-5-0.9 MEQ/L-%-% IV SOLN
INTRAVENOUS | Status: DC
  Administered 2017-01-06: 12:00:00 via INTRAVENOUS
  Administered 2017-01-07: 60 mL/h via INTRAVENOUS
  Administered 2017-01-08 – 2017-01-11 (×4): via INTRAVENOUS
  Filled 2017-01-06 (×11): qty 1000

## 2017-01-06 NOTE — Progress Notes (Signed)
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 He was ventilator dependent after his initial TBI for a few years then he did not require vent support until 2-3 years ago when he needed to be intubated.  He has been vent dependent since (able to tolerate TC intermittently). He was transferred from [place] on 4/27 after being found unresponsive and apneic with brown/tan secretions through trach site.  He was bagged for a couple of minutes and placed on the vent. Upon arrival to ED patient was a GCS of 3, baseline mental status is involuntary movements in the upper extremities and ability to answer yes/no questions. CT Head Negative. CXR with bilateral peribronchial opacities.   Significant Events: 4/27 transfer to ED from [place] 4/28 [place] assumed care  4/29 pulled out his own PEG  Subjective: Pt pulled out his PEG tube over night, apparently at ~5AM, though there are no notes to confirm.  He has otherwise not suffered any acute changes.  He appears comfortable.  I have inspected his PEG site w/ hopes of placing a foley in the fistula to preserve the tract, but the exterior opening is essentially closed at his time.      Assessment & Plan:  Acute on Chronic Ventilator Dependent Respiratory Failure s/p Tracheostomy +/-Aspiration Ongoing care of vent and trach per PCCM - pt removed his own trach on 4/27; it was replaced by PCCM w/ a #8 cuffed shiley - he appears stable from the resp standpoint   H/O MDR Stenotrophomonas maltophilia, MDR Pseudomonas aeruginosa  Antibiotics being directed by ID service - no changes recommended at this time   Chronic Hypotension  Continue home midodrine - stable at  present  Chronic Diastolic Heart Failure  EF 65-70% via TTE Sept 2017 - dry weight appears to be approximately 72 kg - well compensated at this time  [employer]   01/04/17 1604 01/05/17 0500 01/06/17 0500  Weight: 79.4 kg (175 lb) 62.7 kg (138 lb 3.7 oz) 62.6 kg (138 lb 0.1 oz)    Chronic A.fib Continue amiodarone - normal sinus rhythm at time of my exam today  Neurogenic Bladder  foley in place     Dysphagia s/p PEG Has now pulled out his own PEG - unfortunately action was not taken immediately, and now hours later when I am informed his fistula tract is not able to be intubated externally - he will require replacement in radiology in the morning   Anemia of Chronic Disease  Drop in hemoglobin since admission likely related to volume resuscitation - no evidence of acute blood loss - continues to slowly trend down - no obvious blood loss - follow daily   DM2   Currently well-controlled  Chronic Post Traumatic Encephalopathy +/- Metabolic Encephalopathy  Appears to be at his baseline mental status  Quadriplegia, Traumatic Brain Injury as a child  Seizure    Some seizure activity noted 4/28 - increased Keppra - continue Vimpat - when necessary Versed - follow  MRSA screen +   DVT prophylaxis: SQ heparin  Code Status: NO CPR, NO DEFIB, NO ACLS drugs - ok to use vent  Family Communication: no family present at time of exam  Disposition Plan: SDU -  to have PEG replaced 4/30  Consultants:  PCCM ID  Antimicrobials:  Meropenem 4/26 > Linezolid 4/26 > Zosyn 4/27   Objective: Blood pressure (!) 145/81, pulse 80, temperature 97.9 F (36.6 C), temperature source Oral, resp. rate (!) 21, height '5\' 10"'$  (1.778 m), weight 62.6 kg (138 lb 0.1 oz), SpO2 99 %.  Intake/Output Summary (Last 24 hours) at 01/06/17 0851 Last data filed at 01/06/17 0800  Gross per 24 hour  Intake          3781.67 ml  Output             1075 ml  Net          2706.67 ml   Filed Weights   01/04/17  1604 01/05/17 0500 01/06/17 0500  Weight: 79.4 kg (175 lb) 62.7 kg (138 lb 3.7 oz) 62.6 kg (138 lb 0.1 oz)    Examination: General: No acute respiratory distress  Lungs: Clear to auscultation bilaterally - no wheezing  Cardiovascular: Regular rate and rhythm without murmur  Abdomen: Nondistended, soft, bowel sounds positive, no rebound, no ascites, no appreciable mass - PEG insertion site closed externally but otherwise appears benign Extremities: No significant edema bilateral lower extremities  CBC:  Recent Labs Lab 01/04/17 0219 01/05/17 0841 01/06/17 0209  WBC 19.1* 9.9 9.4  NEUTROABS 17.4*  --   --   HGB 12.0* 9.2* 8.5*  HCT 37.1* 28.9* 27.0*  MCV 86.3 85.8 86.0  PLT 360 260 782   Basic Metabolic Panel:  Recent Labs Lab 01/04/17 0219 01/05/17 0841 01/06/17 0209  NA 134* 139 142  K 4.7 4.1 4.1  CL 94* 105 110  CO2 '27 25 25  '$ GLUCOSE 196* 112* 106*  BUN 24* 17 20  CREATININE 0.73 0.59* 0.52*  CALCIUM 10.0 9.1 8.8*  MG  --  1.7  --   PHOS  --  1.5*  --    GFR: Estimated Creatinine Clearance: 90.2 mL/min (A) (by C-G formula based on SCr of 0.52 mg/dL (L)).  Liver Function Tests:  Recent Labs Lab 01/04/17 0219 01/06/17 0209  AST 53* 33  ALT 34 22  ALKPHOS 109 67  BILITOT 0.5 0.7  PROT 9.2* 6.3*  ALBUMIN 4.0 2.8*   Cardiac Enzymes:  Recent Labs Lab 01/04/17 0219 01/04/17 0736 01/04/17 1414 01/04/17 1932  TROPONINI 0.04* 0.08* 0.07* 0.06*    CBG:  Recent Labs Lab 01/04/17 2327 01/05/17 0318 01/05/17 0803 01/05/17 1150 01/05/17 1605  GLUCAP 122* 98 130* 95 103*    Recent Results (from the past 240 hour(s))  Blood Culture (routine x 2)     Status: None (Preliminary result)   Collection Time: 01/04/17  2:19 AM  Result Value Ref Range Status   Specimen Description BLOOD LEFT ARM  Final   Special Requests AEROBIC BOTTLE ONLY Blood Culture adequate volume  Final   Culture NO GROWTH 1 DAY  Final   Report Status PENDING  Incomplete  Blood  Culture (routine x 2)     Status: None (Preliminary result)   Collection Time: 01/04/17  2:35 AM  Result Value Ref Range Status   Specimen Description BLOOD LEFT HAND  Final   Special Requests AEROBIC BOTTLE ONLY Blood Culture adequate volume  Final   Culture NO GROWTH 1 DAY  Final   Report Status PENDING  Incomplete  Culture, respiratory (NON-Expectorated)     Status: None (Preliminary result)   Collection Time: 01/04/17  6:05 AM  Result Value Ref Range Status   Specimen  Description TRACHEAL ASPIRATE  Final   Special Requests NONE  Final   Gram Stain   Final    ABUNDANT WBC PRESENT,BOTH PMN AND MONONUCLEAR FEW GRAM NEGATIVE RODS    Culture MODERATE GRAM NEGATIVE RODS  Final   Report Status PENDING  Incomplete  MRSA PCR Screening     Status: Abnormal   Collection Time: 01/04/17  6:14 AM  Result Value Ref Range Status   MRSA by PCR POSITIVE (A) NEGATIVE Final    Comment:        The GeneXpert MRSA Assay (FDA approved for NASAL specimens only), is one component of a comprehensive MRSA colonization surveillance program. It is not intended to diagnose MRSA infection nor to guide or monitor treatment for MRSA infections. RESULT CALLED TO, READ BACK BY AND VERIFIED WITH: Ferrel Logan RN 9:35 01/04/17 ([email or web address])   Culture, Urine     Status: Abnormal   Collection Time: 01/04/17  3:10 PM  Result Value Ref Range Status   Specimen Description URINE, RANDOM  Final   Special Requests NONE  Final   Culture MULTIPLE SPECIES PRESENT, SUGGEST RECOLLECTION (A)  Final   Report Status 01/05/2017 FINAL  Final     Scheduled Meds: . amiodarone  100 mg Per Tube Daily  . chlorhexidine gluconate (MEDLINE KIT)  15 mL Mouth Rinse BID  . Chlorhexidine Gluconate Cloth  6 each Topical Q0600  . heparin  5,000 Units Subcutaneous Q8H  . lacosamide  100 mg Per Tube BID  . levETIRAcetam  1,000 mg Per Tube BID  . mouth rinse  15 mL Mouth Rinse QID  . midodrine  10 mg Per Tube TID  . mupirocin ointment  1  application Nasal BID  . pantoprazole sodium  40 mg Per Tube Daily   Continuous Infusions: . sodium chloride 50 mL/hr at 01/06/17 0800  . feeding supplement (VITAL AF 1.2 CAL) Stopped (01/06/17 0502)  . linezolid (ZYVOX) IV Stopped (01/05/17 2212)  . meropenem (MERREM) IV Stopped (01/06/17 0600)     LOS: 2 days   Cherene Altes, MD [facility] Office  [phone] Pager - Text Page per Amion as per below:  On-Call/Text Page:      [email or web address]      password [email or web address]  If 7PM-7AM, please contact night-coverage [email or web address] Password [facility] 01/06/2017, 8:51 AM

## 2017-01-07 DIAGNOSIS — T85598D Other mechanical complication of other gastrointestinal prosthetic devices, implants and grafts, subsequent encounter: Secondary | ICD-10-CM | POA: Diagnosis not present

## 2017-01-07 DIAGNOSIS — Z888 Allergy status to other drugs, medicaments and biological substances status: Secondary | ICD-10-CM | POA: Diagnosis not present

## 2017-01-07 DIAGNOSIS — Z881 Allergy status to other antibiotic agents status: Secondary | ICD-10-CM | POA: Diagnosis not present

## 2017-01-07 DIAGNOSIS — Z8619 Personal history of other infectious and parasitic diseases: Secondary | ICD-10-CM | POA: Diagnosis not present

## 2017-01-07 DIAGNOSIS — L8990 Pressure ulcer of unspecified site, unspecified stage: Secondary | ICD-10-CM | POA: Diagnosis not present

## 2017-01-07 DIAGNOSIS — Z9911 Dependence on respirator [ventilator] status: Secondary | ICD-10-CM | POA: Diagnosis not present

## 2017-01-07 DIAGNOSIS — J690 Pneumonitis due to inhalation of food and vomit: Secondary | ICD-10-CM | POA: Diagnosis not present

## 2017-01-07 DIAGNOSIS — J189 Pneumonia, unspecified organism: Secondary | ICD-10-CM | POA: Diagnosis not present

## 2017-01-07 DIAGNOSIS — J9601 Acute respiratory failure with hypoxia: Secondary | ICD-10-CM | POA: Diagnosis not present

## 2017-01-07 DIAGNOSIS — G35 Multiple sclerosis: Secondary | ICD-10-CM | POA: Diagnosis not present

## 2017-01-07 LAB — COMPREHENSIVE METABOLIC PANEL
ALBUMIN: 2.8 g/dL — AB (ref 3.5–5.0)
ALT: 22 U/L (ref 17–63)
AST: 33 U/L (ref 15–41)
Alkaline Phosphatase: 62 U/L (ref 38–126)
Anion gap: 6 (ref 5–15)
BUN: 12 mg/dL (ref 6–20)
CHLORIDE: 114 mmol/L — AB (ref 101–111)
CO2: 23 mmol/L (ref 22–32)
Calcium: 8.7 mg/dL — ABNORMAL LOW (ref 8.9–10.3)
Creatinine, Ser: 0.57 mg/dL — ABNORMAL LOW (ref 0.61–1.24)
GFR calc Af Amer: >60 mL/min (ref >60)
GFR calc non Af Amer: >60 mL/min (ref >60)
GLUCOSE: 89 mg/dL (ref 65–99)
POTASSIUM: 4.1 mmol/L (ref 3.5–5.1)
SODIUM: 143 mmol/L (ref 135–145)
Total Bilirubin: 1.1 mg/dL (ref 0.3–1.2)
Total Protein: 6.2 g/dL — ABNORMAL LOW (ref 6.5–8.1)

## 2017-01-07 LAB — CBC
HEMATOCRIT: 25.6 % — AB (ref 39.0–52.0)
Hemoglobin: 8.1 g/dL — ABNORMAL LOW (ref 13.0–17.0)
MCH: 27.5 pg (ref 26.0–34.0)
MCHC: 31.6 g/dL (ref 30.0–36.0)
MCV: 86.8 fL (ref 78.0–100.0)
PLATELETS: 238 10*3/uL (ref 150–400)
RBC: 2.95 MIL/uL — ABNORMAL LOW (ref 4.22–5.81)
RDW: 17 % — AB (ref 11.5–15.5)
WBC: 7.2 10*3/uL (ref 4.0–10.5)

## 2017-01-07 LAB — CULTURE, RESPIRATORY W GRAM STAIN

## 2017-01-07 LAB — CULTURE, RESPIRATORY

## 2017-01-07 LAB — LEGIONELLA PNEUMOPHILA SEROGP 1 UR AG: L. pneumophila Serogp 1 Ur Ag: NEGATIVE

## 2017-01-07 LAB — LEVETIRACETAM LEVEL: LEVETIRACETAM: 5.6 ug/mL — AB (ref 10.0–40.0)

## 2017-01-07 LAB — GLUCOSE, CAPILLARY: Glucose-Capillary: 89 mg/dL (ref 65–99)

## 2017-01-07 NOTE — Progress Notes (Signed)
Pharmacy Antibiotic Note  Dean Graham is a 56 y.o. male admitted on 01/04/2017 with sepsis.  Pharmacy has been consulted for Merrem dosing - broadened from Zosyn due to history of MDR Acinetobacter, Stenotrophomonas, and Pseudomonas.    Today is antibiotic day #4, afebrile, wbc trending down to 7.2, scr 0.57 (quadriplegic, likely falsely low), UOP 0.9 ml/kg/hr. ID discontinued Zyvox today, Merrem to be continued for a total of 8 days (stop date entered).     Plan: Merrem 1g IV q8h (stop date 5/5) Monitor UOP closely in setting of quadriplegic and difficult to interpret SCr  Monitor clinical status Monitor culture results and ability to narrow therapy   Height: 5\' 10"  (177.8 cm) Weight: 139 lb 15.9 oz (63.5 kg) IBW/kg (Calculated) : 73  Temp (24hrs), Avg:98.3 F (36.8 C), Min:98.1 F (36.7 C), Max:98.7 F (37.1 C)   Recent Labs Lab 01/04/17 0219 01/04/17 0231 01/04/17 0534 01/04/17 0736 01/05/17 0841 01/06/17 0209 01/07/17 0248  WBC 19.1*  --   --   --  9.9 9.4 7.2  CREATININE 0.73  --   --   --  0.59* 0.52* 0.57*  LATICACIDVEN  --  3.31* 2.21* 1.7  --   --   --     Estimated Creatinine Clearance: 91.5 mL/min (A) (by C-G formula based on SCr of 0.57 mg/dL (L)).    Allergies  Allergen Reactions  . Ativan [Lorazepam] Other (See Comments)    unknown  . Azithromycin Other (See Comments)    Blisters all over body  . Ceftriaxone Other (See Comments)    Blisters all over body  . Vancomycin Other (See Comments)    Blisters all over body    Antimicrobials this admission:  Zyvox 4/27 >> 4/30 Merrem 4/27 >> [5/5] Zosyn 4/27 >>4/27  Dose adjustments this admission:  N/A  Microbiology results:  4/27 BCx: ngtd 4/27 UCx: multiple species 4/27 Sputum: mod serratia marcescens (R cefazolin) 4/27 MRSA PCR: positive  Thank you for allowing pharmacy to be a part of this patient's care.  Allie Bossier, PharmD PGY1 Pharmacy Resident [phone] (Pager) 01/07/2017  9:35 AM

## 2017-01-07 NOTE — Progress Notes (Signed)
PULMONARY / CRITICAL CARE MEDICINE   Name: Dean Graham MRN: [id number] DOB: [birth year]    ADMISSION DATE:  01/04/2017 CONSULTATION DATE:  01/04/2017  REFERRING MD:  Dr. 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 He was ventilator dependent after his initial TBI for few years then he did not require vent support until recently 2-3 years ago when he needed to be intubated, has been vent dependent since (able to tolerate TC at time recently).   Presents from [facility] on 4/27 after being found unresponsive and apneic with brown/tan secretions through trach site, patient bagged for a couple of minutes and placed on vent. Upon arrival to ED patient was a GCS of 3, baseline mental status is involuntary movements in the upper extremities and ability to answer yes/no questions. CT Head Negative. CXR with bilateral peribronchial opacities. WBC 19.1, Lactic Acid 3.31. Hemodynamically stable. PCCM to admit.   Of note patient was admitted 9/19-10/9/17 with Pseudomonas PNA > ID involvement for multiple drug resistance.   SUBJECTIVE:  Awake on PSV  VITAL SIGNS: BP (!) 147/112 (BP Location: Right Leg)   Pulse 81   Temp 99.1 F (37.3 C) (Oral)   Resp (!) 23   Ht 5\' 10"  (1.778 m)   Wt 139 lb 15.9 oz (63.5 kg)   SpO2 100%   BMI 20.09 kg/m   HEMODYNAMICS:    VENTILATOR SETTINGS: Vent Mode: PSV;CPAP FiO2 (%):  [40 %] 40 % Set Rate:  [20 bmp] 20 bmp Vt Set:  [500 mL] 500 mL PEEP:  [5 cmH20] 5 cmH20 Pressure Support:  [10 cmH20] 10 cmH20 Plateau Pressure:  [16 cmH20-19 cmH20] 19 cmH20  INTAKE / OUTPUT: I/O last 3 completed shifts: In: 4124 [I.V.:1834; NG/GT:700; IV Piggyback:1590] Out: 1910 [Urine:1910]  General appearance:  56 Year old  Male, NAD Eyes:  anicteric sclerae, moist conjunctivae; PERRL, EOMI bilaterally. Mouth:  membranes and no mucosal ulcerations; normal hard and soft palate Neck: Trachea midline; neck supple, no JVD, trach unremarkable Lungs/chest: scattered rhonchi, with normal respiratory effort and no intercostal retractions CV: RRR, no MRGs  Abdomen: Soft, non-tender; no masses or HSM Extremities: No peripheral edema or extremity lymphadenopathy Skin: Normal temperature, turgor and texture; no rash, ulcers or subcutaneous nodules Neuro/Psych: eyes open, does not f/c/. contracted LABS:  BMET  Recent Labs Lab 01/05/17 0841 01/06/17 0209 01/07/17 0248  NA 139 142 143  K 4.1 4.1 4.1  CL 105 110 114*  CO2 25 25 23   BUN 17 20 12   CREATININE 0.59* 0.52* 0.57*  GLUCOSE 112* 106* 89    Electrolytes  Recent Labs Lab 01/05/17 0841 01/06/17 0209 01/07/17 0248  CALCIUM 9.1 8.8* 8.7*  MG 1.7  --   --   PHOS 1.5*  --   --     CBC  Recent Labs Lab 01/05/17 0841 01/06/17 0209 01/07/17 0248  WBC 9.9 9.4 7.2  HGB 9.2* 8.5* 8.1*  HCT 28.9* 27.0* 25.6*  PLT 260 254 238    Coag's No results for input(s): APTT, INR in the last 168 hours.  Sepsis Markers  Recent Labs Lab 01/04/17 0231 01/04/17 0534 01/04/17 0736 01/05/17 0841 01/06/17 0209  LATICACIDVEN 3.31* 2.21* 1.7  --   --   PROCALCITON  --   --  1.76  2.17 1.43    ABG No results for input(s): PHART, PCO2ART, PO2ART in the last 168 hours.  Liver Enzymes  Recent Labs Lab 01/04/17 0219 01/06/17 0209 01/07/17 0248  AST 53* 33 33  ALT 34 22 22  ALKPHOS 109 67 62  BILITOT 0.5 0.7 1.1  ALBUMIN 4.0 2.8* 2.8*    Cardiac Enzymes  Recent Labs Lab 01/04/17 0736 01/04/17 1414 01/04/17 1932  TROPONINI 0.08* 0.07* 0.06*    Glucose  Recent Labs Lab 01/04/17 1947 01/04/17 2327 01/05/17 0318 01/05/17 0803 01/05/17 1150 01/05/17 1605  GLUCAP 127* 122* 98 130* 95 103*    Imaging  No results found. STUDIES:  CXR 4/27 >  Bilateral peribronchial opacities, likely mild pulmonary edema, no large consolidation  CT Head 4/27> no acute ICH, age-related atrophy, bifrontal old infarcts and encephalomalacia, right there are left, right frontoparietal craniotomy and shunt catheter along the right sylvian fissure   CULTURES: Blood 4/27 >  Sputum 4/27>serratia M MRSA 4/27 >> Legion Urine 4/27 >> neg Strep Urine 4/27>>neg  ANTIBIOTICS: Allergic to azithromycin, ceftx, vanc, and ativan Meropenem 4/27 single dose Zosyn 4/27>>off Linezolid 4/27>>off  SIGNIFICANT EVENTS: 4/27 > Presents to ED   LINES/TUBES: Trach (Chronic) PEG (Chronic) Foley (Chronic)  DISCUSSION:    ASSESSMENT / PLAN:  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  Pulm problem list  Trach/vent dependence d/t chronic encephalopathy, quadriplegia & ineffective cough Plan Cont full vent support  Aspiration PNA vs HCAP +Serratia marcescens  Plan To complete 8d of merrem for HCAP  Discussion  He could go back to [facility] as soon as his PEG tube issue is resolved.   Dean Graham ACNP-BC [facility] Pulmonary/Critical Care Pager # [phone] OR # [phone] if no answer   ATTENDING NOTE / ATTESTATION NOTE :   I have discussed the case with the resident/APP Dean Graham    I agree with the resident/APP's  history, physical examination, assessment, and plans.    I have edited the above note and modified it according to our agreed history, physical examination, assessment and plan.   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 He was ventilator dependent after his  initial TBI for few years then he did not require vent support until recently 2-3 years ago when he needed to be intubated, has been vent dependent since (able to tolerate TC at time recently).   Presents from [facility] on 4/27 after being found unresponsive and apneic with brown/tan secretions through trach site, patient bagged for a couple of minutes and placed on vent. Upon arrival to ED patient was a GCS of 3, baseline mental status is involuntary movements in the upper extremities and ability to answer yes/no questions. CT Head Negative. CXR with bilateral peribronchial opacities. WBC 19.1, Lactic Acid 3.31. Hemodynamically stable. PCCM to admit.   Pt was transferred to 4E today.   Pt seen, VSS. On the ventilator.  Chronically ill. Crackles at bases. Rest of exam per [facility].   Assessment/Plan : Acute on Chronic Hypoxemic Respiratory Failure 2/2 VAP h/o MDR pseudomonas infection and stenotrophomonas - No weaning - keep on the ventilator - finish Meropenem as planned.  - I anticipate, he will  be back to [facility] once infection is controlled.    Family :No family at bedside.    Pollie Meyer, MD 01/07/2017, 4:58 PM [place] [facility] and Critical [facility] Pager [phone] After 3 pm or if no answer, call [phone]

## 2017-01-07 NOTE — Progress Notes (Signed)
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 He was ventilator dependent after his initial TBI for a few years then he did not require vent support until 2-3 years ago when he needed to be intubated.  He has been vent dependent since (able to tolerate TC intermittently). He was transferred from [place] on 4/27 after being found unresponsive and apneic with brown/tan secretions through trach site.  He was bagged for a couple of minutes and placed on the vent. Upon arrival to ED patient was a GCS of 3, baseline mental status is involuntary movements in the upper extremities and ability to answer yes/no questions. CT Head Negative. CXR with bilateral peribronchial opacities.   Significant Events: 4/27 transfer to ED from [place] 4/28 Craig assumed care  4/29 pulled out his own PEG  Subjective: The pt is resting comfortably at the time of my visit.  He is not able to communicated effectively or provide a ROS.  He does not appear to be in resp distress.     Assessment & Plan:  Acute on Chronic Ventilator Dependent Respiratory Failure s/p Tracheostomy +/-Aspiration Ongoing care of vent and trach per PCCM - pt removed his own trach on 4/27; it was replaced by PCCM w/ a #8 cuffed shiley - he appears stable from the resp standpoint   H/O MDR Stenotrophomonas maltophilia, MDR Pseudomonas aeruginosa  Antibiotics being directed by ID service - no changes recommended at this time - no fever and WBC has normalized   Chronic Hypotension  Continue home midodrine - BP normal at this time   Chronic Diastolic Heart Failure  EF 65-70% via TTE Sept 2017 - dry weight appears to be approximately 72 kg - well  compensated at this time  [employer]   01/05/17 0500 01/06/17 0500 01/07/17 0500  Weight: 62.7 kg (138 lb 3.7 oz) 62.6 kg (138 lb 0.1 oz) 63.5 kg (139 lb 15.9 oz)    Chronic A.fib Continue amiodarone - normal sinus rhythm today  Neurogenic Bladder  foley in place     Dysphagia s/p PEG pulled out his own PEG 4/29 - unfortunately action was not taken immediately, and his fistula tract is not able to be intubated blindly - I have asked Radiology to assist in replacing his PEG  Anemia of Chronic Disease  Drop in hemoglobin since admission likely related to volume resuscitation - no evidence of acute blood loss - continues to slowly trend down - no obvious blood loss - following daily - transfuse as needed for Hgb < 7.0  DM2   Well controlled  Chronic Post Traumatic Encephalopathy +/- Metabolic Encephalopathy  Appears to be at his baseline mental status  Quadriplegia, Traumatic Brain Injury as a child  Seizure    Some seizure activity noted 4/28 - increased Keppra - continue Vimpat - when necessary Versed - appears stable at present   MRSA screen +   DVT prophylaxis: SQ heparin  Code Status: NO CPR, NO DEFIB, NO ACLS drugs - ok to use vent  Family Communication: no family present at time of exam  Disposition Plan: SDU on vent   Consultants:  PCCM ID  Antimicrobials:  Meropenem 4/26 > Linezolid 4/26 > Zosyn 4/27  Objective: Blood pressure 121/81, pulse 86, temperature 98.1 F (36.7 C), temperature source Oral, resp. rate 20, height '5\' 10"'$  (1.778 m), weight 63.5 kg (139 lb 15.9 oz), SpO2 100 %.  Intake/Output Summary (Last 24 hours) at 01/07/17 0850 Last data filed at 01/07/17 0818  Gross per 24 hour  Intake             2354 ml  Output             1410 ml  Net              944 ml   Filed Weights   01/05/17 0500 01/06/17 0500 01/07/17 0500  Weight: 62.7 kg (138 lb 3.7 oz) 62.6 kg (138 lb 0.1 oz) 63.5 kg (139 lb 15.9 oz)    Examination: General: No acute  respiratory distress on vent Lungs: Clear to auscultation bilaterally - no wheezing or focal crackles  Cardiovascular: RRR w/o M or gallup  Abdomen: Nondistended, soft, bowel sounds positive, no rebound - PEG insertion site still closed externally but otherwise appears benign Extremities: No significant edema B LE   CBC:  Recent Labs Lab 01/04/17 0219 01/05/17 0841 01/06/17 0209 01/07/17 0248  WBC 19.1* 9.9 9.4 7.2  NEUTROABS 17.4*  --   --   --   HGB 12.0* 9.2* 8.5* 8.1*  HCT 37.1* 28.9* 27.0* 25.6*  MCV 86.3 85.8 86.0 86.8  PLT 360 260 254 761   Basic Metabolic Panel:  Recent Labs Lab 01/04/17 0219 01/05/17 0841 01/06/17 0209 01/07/17 0248  NA 134* 139 142 143  K 4.7 4.1 4.1 4.1  CL 94* 105 110 114*  CO2 '27 25 25 23  '$ GLUCOSE 196* 112* 106* 89  BUN 24* '17 20 12  '$ CREATININE 0.73 0.59* 0.52* 0.57*  CALCIUM 10.0 9.1 8.8* 8.7*  MG  --  1.7  --   --   PHOS  --  1.5*  --   --    GFR: Estimated Creatinine Clearance: 91.5 mL/min (A) (by C-G formula based on SCr of 0.57 mg/dL (L)).  Liver Function Tests:  Recent Labs Lab 01/04/17 0219 01/06/17 0209 01/07/17 0248  AST 53* 33 33  ALT 34 22 22  ALKPHOS 109 67 62  BILITOT 0.5 0.7 1.1  PROT 9.2* 6.3* 6.2*  ALBUMIN 4.0 2.8* 2.8*   Cardiac Enzymes:  Recent Labs Lab 01/04/17 0219 01/04/17 0736 01/04/17 1414 01/04/17 1932  TROPONINI 0.04* 0.08* 0.07* 0.06*    CBG:  Recent Labs Lab 01/04/17 2327 01/05/17 0318 01/05/17 0803 01/05/17 1150 01/05/17 1605  GLUCAP 122* 98 130* 95 103*    Recent Results (from the past 240 hour(s))  Blood Culture (routine x 2)     Status: None (Preliminary result)   Collection Time: 01/04/17  2:19 AM  Result Value Ref Range Status   Specimen Description BLOOD LEFT ARM  Final   Special Requests AEROBIC BOTTLE ONLY Blood Culture adequate volume  Final   Culture NO GROWTH 2 DAYS  Final   Report Status PENDING  Incomplete  Blood Culture (routine x 2)     Status: None  (Preliminary result)   Collection Time: 01/04/17  2:35 AM  Result Value Ref Range Status   Specimen Description BLOOD LEFT HAND  Final   Special Requests AEROBIC BOTTLE ONLY Blood Culture adequate volume  Final   Culture NO GROWTH 2 DAYS  Final   Report Status PENDING  Incomplete  Culture, respiratory (NON-Expectorated)     Status: None  Collection Time: 01/04/17  6:05 AM  Result Value Ref Range Status   Specimen Description TRACHEAL ASPIRATE  Final   Special Requests NONE  Final   Gram Stain   Final    ABUNDANT WBC PRESENT,BOTH PMN AND MONONUCLEAR FEW GRAM NEGATIVE RODS    Culture MODERATE SERRATIA MARCESCENS  Final   Report Status 01/07/2017 FINAL  Final   Organism ID, Bacteria SERRATIA MARCESCENS  Final      Susceptibility   Serratia marcescens - MIC*    CEFAZOLIN >=64 RESISTANT Resistant     CEFEPIME <=1 SENSITIVE Sensitive     CEFTAZIDIME <=1 SENSITIVE Sensitive     CEFTRIAXONE <=1 SENSITIVE Sensitive     CIPROFLOXACIN <=0.25 SENSITIVE Sensitive     GENTAMICIN <=1 SENSITIVE Sensitive     TRIMETH/SULFA <=20 SENSITIVE Sensitive     * MODERATE SERRATIA MARCESCENS  MRSA PCR Screening     Status: Abnormal   Collection Time: 01/04/17  6:14 AM  Result Value Ref Range Status   MRSA by PCR POSITIVE (A) NEGATIVE Final    Comment:        The GeneXpert MRSA Assay (FDA approved for NASAL specimens only), is one component of a comprehensive MRSA colonization surveillance program. It is not intended to diagnose MRSA infection nor to guide or monitor treatment for MRSA infections. RESULT CALLED TO, READ BACK BY AND VERIFIED WITH: Ferrel Logan RN 9:35 01/04/17 ([email or web address])   Culture, Urine     Status: Abnormal   Collection Time: 01/04/17  3:10 PM  Result Value Ref Range Status   Specimen Description URINE, RANDOM  Final   Special Requests NONE  Final   Culture MULTIPLE SPECIES PRESENT, SUGGEST RECOLLECTION (A)  Final   Report Status 01/05/2017 FINAL  Final     Scheduled Meds: .  chlorhexidine gluconate (MEDLINE KIT)  15 mL Mouth Rinse BID  . Chlorhexidine Gluconate Cloth  6 each Topical Q0600  . heparin  5,000 Units Subcutaneous Q8H  . mouth rinse  15 mL Mouth Rinse QID  . mupirocin ointment  1 application Nasal BID   Continuous Infusions: . dextrose 5 % and 0.9 % NaCl with KCl 20 mEq/L 60 mL/hr (01/07/17 0624)  . lacosamide (VIMPAT) IV Stopped (01/06/17 2232)  . levETIRAcetam Stopped (01/06/17 2138)  . linezolid (ZYVOX) IV Stopped (01/06/17 2333)  . meropenem (MERREM) IV 1 g (01/07/17 0818)     LOS: 3 days   Cherene Altes, MD [facility] Office  [phone] Pager - Text Page per Amion as per below:  On-Call/Text Page:      [email or web address]      password [email or web address]  If 7PM-7AM, please contact night-coverage [email or web address] Password [email or web address] 01/07/2017, 8:50 AM

## 2017-01-07 NOTE — Progress Notes (Signed)
INFECTIOUS DISEASE PROGRESS NOTE  ID: Dean Graham is a 56 y.o. male with  Active Problems:   Aspiration pneumonia ([place])   Ventilator dependent ([place])   History of infection due to multidrug resistant Pseudomonas aeruginosa   Fever   Leukocytosis  Subjective: Awake and alert.   Abtx:  Anti-infectives    Start     Dose/Rate Route Frequency Ordered Stop   01/04/17 2200  linezolid (ZYVOX) IVPB 600 mg     600 mg 300 mL/hr over 60 Minutes Intravenous Every 12 hours 01/04/17 1147     01/04/17 1600  meropenem (MERREM) 1 g in sodium chloride 0.9 % 100 mL IVPB     1 g 200 mL/hr over 30 Minutes Intravenous Every 8 hours 01/04/17 1147     01/04/17 1000  meropenem (MERREM) 1 g in sodium chloride 0.9 % 100 mL IVPB  Status:  Discontinued     1 g 200 mL/hr over 30 Minutes Intravenous Every 8 hours 01/04/17 0351 01/04/17 0613   01/04/17 1000  piperacillin-tazobactam (ZOSYN) IVPB 3.375 g  Status:  Discontinued     3.375 g 12.5 mL/hr over 240 Minutes Intravenous Every 8 hours 01/04/17 0623 01/04/17 1147   01/04/17 1000  linezolid (ZYVOX) IVPB 600 mg  Status:  Discontinued     600 mg 300 mL/hr over 60 Minutes Intravenous Every 12 hours 01/04/17 0633 01/04/17 0705   01/04/17 0300  linezolid (ZYVOX) IVPB 600 mg  Status:  Discontinued     600 mg 300 mL/hr over 60 Minutes Intravenous Every 12 hours 01/04/17 0254 01/04/17 1147   01/04/17 0300  meropenem (MERREM) 1 g in sodium chloride 0.9 % 100 mL IVPB     1 g 200 mL/hr over 30 Minutes Intravenous  Once 01/04/17 0255 01/04/17 0400      Medications:  Scheduled: . chlorhexidine gluconate (MEDLINE KIT)  15 mL Mouth Rinse BID  . Chlorhexidine Gluconate Cloth  6 each Topical Q0600  . heparin  5,000 Units Subcutaneous Q8H  . mouth rinse  15 mL Mouth Rinse QID  . mupirocin ointment  1 application Nasal BID    Objective: Vital signs in last 24 hours: Temp:  [98.1 F (36.7 C)-98.7 F (37.1 C)] 98.1 F (36.7 C) (04/30 0805) Pulse Rate:   [67-94] 86 (04/30 0843) Resp:  [16-23] 20 (04/30 0843) BP: (91-134)/(66-107) 121/81 (04/30 0843) SpO2:  [81 %-100 %] 100 % (04/30 0843) FiO2 (%):  [40 %] 40 % (04/30 0843) Weight:  [63.5 kg (139 lb 15.9 oz)] 63.5 kg (139 lb 15.9 oz) (04/30 0500)   General appearance: alert and no distress Resp: rhonchi anterior - bilateral Cardio: regular rate and rhythm GI: normal findings: bowel sounds normal and soft, non-tender  Lab Results  Recent Labs  01/06/17 0209 01/07/17 0248  WBC 9.4 7.2  HGB 8.5* 8.1*  HCT 27.0* 25.6*  NA 142 143  K 4.1 4.1  CL 110 114*  CO2 25 23  BUN 20 12  CREATININE 0.52* 0.57*   Liver Panel  Recent Labs  01/06/17 0209 01/07/17 0248  PROT 6.3* 6.2*  ALBUMIN 2.8* 2.8*  AST 33 33  ALT 22 22  ALKPHOS 67 62  BILITOT 0.7 1.1   Sedimentation Rate No results for input(s): ESRSEDRATE in the last 72 hours. C-Reactive Protein No results for input(s): CRP in the last 72 hours.  Microbiology: Recent Results (from the past 240 hour(s))  Blood Culture (routine x 2)     Status: None (Preliminary result)  Collection Time: 01/04/17  2:19 AM  Result Value Ref Range Status   Specimen Description BLOOD LEFT ARM  Final   Special Requests AEROBIC BOTTLE ONLY Blood Culture adequate volume  Final   Culture NO GROWTH 2 DAYS  Final   Report Status PENDING  Incomplete  Blood Culture (routine x 2)     Status: None (Preliminary result)   Collection Time: 01/04/17  2:35 AM  Result Value Ref Range Status   Specimen Description BLOOD LEFT HAND  Final   Special Requests AEROBIC BOTTLE ONLY Blood Culture adequate volume  Final   Culture NO GROWTH 2 DAYS  Final   Report Status PENDING  Incomplete  Culture, respiratory (NON-Expectorated)     Status: None   Collection Time: 01/04/17  6:05 AM  Result Value Ref Range Status   Specimen Description TRACHEAL ASPIRATE  Final   Special Requests NONE  Final   Gram Stain   Final    ABUNDANT WBC PRESENT,BOTH PMN AND  MONONUCLEAR FEW GRAM NEGATIVE RODS    Culture MODERATE SERRATIA MARCESCENS  Final   Report Status 01/07/2017 FINAL  Final   Organism ID, Bacteria SERRATIA MARCESCENS  Final      Susceptibility   Serratia marcescens - MIC*    CEFAZOLIN >=64 RESISTANT Resistant     CEFEPIME <=1 SENSITIVE Sensitive     CEFTAZIDIME <=1 SENSITIVE Sensitive     CEFTRIAXONE <=1 SENSITIVE Sensitive     CIPROFLOXACIN <=0.25 SENSITIVE Sensitive     GENTAMICIN <=1 SENSITIVE Sensitive     TRIMETH/SULFA <=20 SENSITIVE Sensitive     * MODERATE SERRATIA MARCESCENS  MRSA PCR Screening     Status: Abnormal   Collection Time: 01/04/17  6:14 AM  Result Value Ref Range Status   MRSA by PCR POSITIVE (A) NEGATIVE Final    Comment:        The GeneXpert MRSA Assay (FDA approved for NASAL specimens only), is one component of a comprehensive MRSA colonization surveillance program. It is not intended to diagnose MRSA infection nor to guide or monitor treatment for MRSA infections. RESULT CALLED TO, READ BACK BY AND VERIFIED WITH: Ferrel Logan RN 9:35 01/04/17 ([email or web address])   Culture, Urine     Status: Abnormal   Collection Time: 01/04/17  3:10 PM  Result Value Ref Range Status   Specimen Description URINE, RANDOM  Final   Special Requests NONE  Final   Culture MULTIPLE SPECIES PRESENT, SUGGEST RECOLLECTION (A)  Final   Report Status 01/05/2017 FINAL  Final    Studies/Results: No results found.   Assessment/Plan: Hx MDR organisms Pneumonia MS change  Pressure ulcers    Total days of antibiotics: 3 zyvox/merrem  Will stop zyvox given cx results.  Will continue merrem for 8 days as per HCAP guidelines.  Appears improved in terms of mental status.  Appreciate WOC Available as needed.          [name] Infectious Diseases (pager) [phone] [email or web address] 01/07/2017, 8:46 AM  LOS: 3 days

## 2017-01-08 ENCOUNTER — Encounter (HOSPITAL_COMMUNITY): Payer: Self-pay

## 2017-01-08 ENCOUNTER — Inpatient Hospital Stay (HOSPITAL_COMMUNITY): Payer: Medicare Other

## 2017-01-08 DIAGNOSIS — L89512 Pressure ulcer of right ankle, stage 2: Secondary | ICD-10-CM | POA: Diagnosis not present

## 2017-01-08 DIAGNOSIS — Y848 Other medical procedures as the cause of abnormal reaction of the patient, or of later complication, without mention of misadventure at the time of the procedure: Secondary | ICD-10-CM | POA: Diagnosis not present

## 2017-01-08 DIAGNOSIS — J690 Pneumonitis due to inhalation of food and vomit: Secondary | ICD-10-CM | POA: Diagnosis not present

## 2017-01-08 DIAGNOSIS — R1310 Dysphagia, unspecified: Secondary | ICD-10-CM | POA: Diagnosis not present

## 2017-01-08 DIAGNOSIS — T85598S Other mechanical complication of other gastrointestinal prosthetic devices, implants and grafts, sequela: Secondary | ICD-10-CM | POA: Diagnosis not present

## 2017-01-08 DIAGNOSIS — J9621 Acute and chronic respiratory failure with hypoxia: Secondary | ICD-10-CM | POA: Diagnosis not present

## 2017-01-08 DIAGNOSIS — N319 Neuromuscular dysfunction of bladder, unspecified: Secondary | ICD-10-CM | POA: Diagnosis not present

## 2017-01-08 DIAGNOSIS — Z931 Gastrostomy status: Secondary | ICD-10-CM | POA: Diagnosis not present

## 2017-01-08 DIAGNOSIS — R532 Functional quadriplegia: Secondary | ICD-10-CM | POA: Diagnosis not present

## 2017-01-08 DIAGNOSIS — I9589 Other hypotension: Secondary | ICD-10-CM | POA: Diagnosis not present

## 2017-01-08 DIAGNOSIS — R509 Fever, unspecified: Secondary | ICD-10-CM | POA: Diagnosis not present

## 2017-01-08 DIAGNOSIS — T85598D Other mechanical complication of other gastrointestinal prosthetic devices, implants and grafts, subsequent encounter: Secondary | ICD-10-CM | POA: Diagnosis not present

## 2017-01-08 DIAGNOSIS — G9341 Metabolic encephalopathy: Secondary | ICD-10-CM | POA: Diagnosis not present

## 2017-01-08 DIAGNOSIS — J95851 Ventilator associated pneumonia: Secondary | ICD-10-CM | POA: Diagnosis not present

## 2017-01-08 DIAGNOSIS — Z930 Tracheostomy status: Secondary | ICD-10-CM | POA: Diagnosis not present

## 2017-01-08 DIAGNOSIS — I482 Chronic atrial fibrillation: Secondary | ICD-10-CM | POA: Diagnosis not present

## 2017-01-08 DIAGNOSIS — I5032 Chronic diastolic (congestive) heart failure: Secondary | ICD-10-CM | POA: Diagnosis not present

## 2017-01-08 DIAGNOSIS — R569 Unspecified convulsions: Secondary | ICD-10-CM | POA: Diagnosis not present

## 2017-01-08 DIAGNOSIS — Z9911 Dependence on respirator [ventilator] status: Secondary | ICD-10-CM | POA: Diagnosis not present

## 2017-01-08 DIAGNOSIS — Y95 Nosocomial condition: Secondary | ICD-10-CM | POA: Diagnosis not present

## 2017-01-08 DIAGNOSIS — L89224 Pressure ulcer of left hip, stage 4: Secondary | ICD-10-CM | POA: Diagnosis not present

## 2017-01-08 DIAGNOSIS — F0781 Postconcussional syndrome: Secondary | ICD-10-CM | POA: Diagnosis not present

## 2017-01-08 DIAGNOSIS — Z66 Do not resuscitate: Secondary | ICD-10-CM | POA: Diagnosis not present

## 2017-01-08 DIAGNOSIS — J9601 Acute respiratory failure with hypoxia: Secondary | ICD-10-CM | POA: Diagnosis not present

## 2017-01-08 DIAGNOSIS — E872 Acidosis: Secondary | ICD-10-CM | POA: Diagnosis not present

## 2017-01-08 LAB — GLUCOSE, CAPILLARY
GLUCOSE-CAPILLARY: 87 mg/dL (ref 65–99)
GLUCOSE-CAPILLARY: 93 mg/dL (ref 65–99)
GLUCOSE-CAPILLARY: 103 mg/dL — AB (ref 65–99)
Glucose-Capillary: 80 mg/dL (ref 65–99)
Glucose-Capillary: 87 mg/dL (ref 65–99)
Glucose-Capillary: 96 mg/dL (ref 65–99)
Glucose-Capillary: 97 mg/dL (ref 65–99)

## 2017-01-08 LAB — CBC
HCT: 29.7 % — ABNORMAL LOW (ref 39.0–52.0)
Hemoglobin: 9.4 g/dL — ABNORMAL LOW (ref 13.0–17.0)
MCH: 27.8 pg (ref 26.0–34.0)
MCHC: 31.6 g/dL (ref 30.0–36.0)
MCV: 87.9 fL (ref 78.0–100.0)
PLATELETS: 281 10*3/uL (ref 150–400)
RBC: 3.38 MIL/uL — AB (ref 4.22–5.81)
RDW: 17.1 % — ABNORMAL HIGH (ref 11.5–15.5)
WBC: 8.8 10*3/uL (ref 4.0–10.5)

## 2017-01-08 MED ORDER — LORAZEPAM 2 MG/ML IJ SOLN
INTRAMUSCULAR | Status: AC
  Filled 2017-01-08: qty 1

## 2017-01-08 MED ORDER — LORAZEPAM 2 MG/ML IJ SOLN
2.0000 mg | Freq: Once | INTRAMUSCULAR | Status: AC
  Administered 2017-01-08: 2 mg via INTRAVENOUS

## 2017-01-08 NOTE — NC FL2 (Signed)
[place] LEVEL OF CARE SCREENING TOOL     IDENTIFICATION  Patient Name: Dean Graham Birthdate: [birth year] Sex: male Admission Date (Current Location): 01/04/2017  [place] and [place] Number:   [place])   Facility and Address:  [facility] [place] [place], [place] [address] [place], [place] [address]      Provider Number: [id number]  Attending Physician Name and Address:  Cherene Altes, MD  Relative Name and Phone Number:       Current Level of Care: Hospital Recommended Level of Care: [place] Prior Approval Number:    Date Approved/Denied:   PASRR Number: [id number]  Discharge Plan: SNF    Current Diagnoses: Patient Active Problem List   Diagnosis Date Noted  . Acute hypoxemic respiratory failure ([place])   . Ventilator dependent ([place])   . 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 Fever   . Leukocytosis   . Aspiration pneumonia ([place]) 01/04/2017  . Anasarca   . Bleeding   . Tracheostomy care ([place])   . Hypoalbuminemia   . Cough with frothy sputum   . VAP (ventilator-associated pneumonia) ([place])   . Carbapenem-resistant Acinetobacter baumannii infection   . History of infection by MDR Stenotrophomonas maltophilia   . History of MDR Pseudomonas aeruginosa infection   . Chronic post traumatic encephalopathy   . Acute encephalopathy   . Hypokalemia   . Hypomagnesemia   . Urinary tract infection, site not specified   . Acute on chronic respiratory failure with hypoxia ([place])   . Dysphagia   . Decubitus ulcer of sacral area   . Encounter for wound care   . Protein-calorie malnutrition, severe ([place])   . Palliative care encounter   . Goals of care, counseling/discussion   . Pressure injury of skin 05/31/2016  . Quadriplegia ([place]) 05/31/2016  . Atrial fibrillation ([place]) 05/31/2016  . Diabetes mellitus without complication ([place]) 96/28/3662  . Chronic respiratory failure ([place]) 05/31/2016  .  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 Septic shock ([place]) 05/29/2016  . HCAP (healthcare-associated pneumonia)     Orientation RESPIRATION BLADDER Height & Weight      (responds to voice)  Tracheostomy, Vent, O2 (fiO2 40%) Indwelling catheter Weight: 139 lb 15.9 oz (63.5 kg) Height:  _0  (177.8 cm)  BEHAVIORAL SYMPTOMS/MOOD NEUROLOGICAL BOWEL NUTRITION STATUS      Incontinent Feeding tube  AMBULATORY STATUS COMMUNICATION OF NEEDS Skin   Extensive Assist Non-Verbally (nods head yes/no) PU Stage and Appropriate Care PU Stage 1 Dressing:  (change as needed) PU Stage 2 Dressing:  (change as needed)   PU Stage 4 Dressing: Daily               Personal Care Assistance Level of Assistance  Total care, Dressing, Feeding, Bathing Bathing Assistance: Maximum assistance Feeding assistance: Maximum assistance Dressing Assistance: Maximum assistance Total Care Assistance: Maximum assistance   Functional Limitations Info        Speech Info: Impaired (unable to assess due to vent/trach)    SPECIAL CARE FACTORS FREQUENCY                       Contractures      Additional Factors Info  Code Status, Allergies, Isolation Precautions Code Status Info: Partial Allergies Info: Ativan Lorazepam, Azithromycin, Ceftriaxone, Vancomycin     Isolation Precautions Info: MRSA, OMDRO     Current Medications (01/08/2017):  This is the current hospital active medication list Current  Facility-Administered Medications  Medication Dose Route Frequency Provider Last Rate Last Dose  . albuterol (PROVENTIL) (2.5 MG/3ML) 0.083% nebulizer solution 2.5 mg  2.5 mg Nebulization Q2H PRN Cherene Altes, MD      . chlorhexidine gluconate (MEDLINE KIT) (PERIDEX) 0.12 % solution 15 mL  15 mL Mouth Rinse BID Jose Shirl Harris, MD   15 mL at 01/08/17 0800  . dextrose 5 % and 0.9 % NaCl with KCl 20 mEq/L infusion   Intravenous Continuous Cherene Altes, MD 60 mL/hr at 01/08/17 0137    . heparin  injection 5,000 Units  5,000 Units Subcutaneous Q8H Omar Person, NP   5,000 Units at 01/08/17 0506  . lacosamide (VIMPAT) 100 mg in sodium chloride 0.9 % 25 mL IVPB  100 mg Intravenous Q12H Cherene Altes, MD   Stopped at 01/08/17 [phone]  . levETIRAcetam (KEPPRA) 1,000 mg in sodium chloride 0.9 % 100 mL IVPB  1,000 mg Intravenous Q12H Cherene Altes, MD   Stopped at 01/08/17 [phone]  . MEDLINE mouth rinse  15 mL Mouth Rinse QID Jose Shirl Harris, MD   15 mL at 01/08/17 0459  . meropenem (MERREM) 1 g in sodium chloride 0.9 % 100 mL IVPB  1 g Intravenous Q8H Campbell Riches, MD   Stopped at 01/08/17 [phone]  . midazolam (VERSED) injection 2 mg  2 mg Intravenous Q2H PRN Bethany Molt, DO   2 mg at 01/05/17 0708  . mupirocin ointment (BACTROBAN) 2 % 1 application  1 application Nasal BID [place], MD   1 application at 50/01/64 2903     Discharge Medications: Please see discharge summary for a list of discharge medications.  Relevant Imaging Results:  Relevant Lab Results:   Additional Information SS#: [id number]  Geralynn Ochs, LCSW

## 2017-01-08 NOTE — Progress Notes (Signed)
RN called to room patient tachycardic, shaking more than p[previously noted, and diaphoretic. CBG checked and was 96. Patient suctioned with little secretions. Respiratory called. MD called and Dr. Sharon Seller in to see patient. Sat 100%. Order received for ativan 2mg  IV now. Medication given.will continue to monitor.

## 2017-01-08 NOTE — Care Management Note (Signed)
Case Management Note  Patient Details  Name: Miroslav Ganci MRN: [id number] Date of Birth: 04-01-1961  Subjective/Objective:    From [facility] SN[facility], CSW aware,  Vent dependent  With trach # 8 cuffed shiley, hypotension, CHF, afib, neurogenic bladder, anemia, DM, encephalopathy, quad, seizure,  IR today for peg replacement, conts on iv abx, iv vimpat.,                Action/Plan: NCM will cont to follow along with CSW.   Expected Discharge Date:                  Expected Discharge Plan:  Skilled Nursing Facility  In-House Referral:  Clinical Social Work  Discharge planning Services  CM Consult  Post Acute Care Choice:    Choice offered to:     DME Arranged:    DME Agency:     HH Arranged:    HH Agency:     Status of Service:  Completed, signed off  If discussed at [employer], dates discussed:    Additional Comments:  Leone Haven, RN 01/08/2017, 4:59 PM

## 2017-01-08 NOTE — Progress Notes (Signed)
MD paged for order to continue foley

## 2017-01-08 NOTE — Progress Notes (Signed)
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 He was ventilator dependent after his initial TBI for a few years then he did not require vent support until 2-3 years ago when he needed to be intubated.  He has been vent dependent since (able to tolerate TC intermittently). He was transferred from [place] on 4/27 after being found unresponsive and apneic with brown/tan secretions through trach site.  He was bagged for a couple of minutes and placed on the vent. Upon arrival to ED patient was a GCS of 3, baseline mental status is involuntary movements in the upper extremities and ability to answer yes/no questions. CT Head Negative. CXR with bilateral peribronchial opacities.   Significant Events: 4/27 transfer to ED from [facility] 4/28 [place] assumed care  4/29 pulled out his own PEG 5/1 seizure episode v/s agitation   Subjective: The patient is experiencing rhythmic contractions of his lower extremities and shaking of his upper extremities at the time of visit.  It's unclear if he is agitated or if this represents seizure activity.  His tachycardic and significant hypertensive during this period as well.  He is dosed with 2 mg of IV Ativan.  He does not otherwise appear to be unstable at this time.  I spoke with his mother at the bedside who reports that he intermittently will have episodes like this.  Assessment & Plan:  Acute on Chronic Ventilator Dependent Respiratory Failure s/p Tracheostomy +/-Aspiration  Ongoing care of vent and trach per PCCM - pt removed his own trach on 4/27; it was replaced by PCCM w/ a #8 cuffed shiley - he appears stable from the resp standpoint   H/O MDR Stenotrophomonas  maltophilia, MDR Pseudomonas aeruginosa - Serratia in trach aspirate  Antibiotics directed by ID service - no fever and WBC has normalized - to complete 8 days of Merrem treatment  Chronic Hypotension  Continue home midodrine - BP normal at this time   Chronic Diastolic Heart Failure  EF 65-70% via TTE Sept 2017 - dry weight appears to be approximately 72 kg - well compensated at this time  [employer]   01/06/17 0500 01/07/17 0500 01/08/17 4742  Weight: 62.6 kg (138 lb 0.1 oz) 63.5 kg (139 lb 15.9 oz) 63.5 kg (139 lb 15.9 oz)    Chronic A.fib Continue amiodarone - normal sinus rhythm today except for episode of sinus tachy during possible seizure activity   Neurogenic Bladder  foley in place     Dysphagia s/p PEG pulled out his own PEG 4/29 - unfortunately action was not taken immediately, and his fistula tract was not able to be intubated blindly - I have asked Radiology to assist in replacing his PEG and this is to be scheduled for 5/2 (postponed from 5/1 due to seizure type activity)  Anemia of Chronic Disease  Drop in hemoglobin since admission likely related to volume resuscitation - no evidence of acute blood loss - has stabilized   DM2   Well controlled/stable   Chronic Post Traumatic Encephalopathy +/- Metabolic Encephalopathy  Appears to be at his baseline mental status  Quadriplegia, Traumatic Brain Injury as a child  Seizure    Some seizure activity noted 4/28 and possibly today, though  this is difficult to distinguish from agitation - Keppra has been increased during this hospital stay - continue Vimpat - when necessary Versed - appears stable at present   MRSA screen +   DVT prophylaxis: SQ heparin  Code Status: NO CPR, NO DEFIB, NO ACLS drugs - ok to use vent  Family Communication: no family present at time of exam  Disposition Plan: SDU on vent   Consultants:  PCCM ID  Antimicrobials:  Meropenem 4/26 > Linezolid 4/26 > 4/29 Zosyn 4/27    Objective: Blood pressure 119/83, pulse 62, temperature 98.7 F (37.1 C), temperature source Axillary, resp. rate (!) 23, height '5\' 10"'$  (1.778 m), weight 63.5 kg (139 lb 15.9 oz), SpO2 95 %.  Intake/Output Summary (Last 24 hours) at 01/08/17 1508 Last data filed at 01/08/17 1400  Gross per 24 hour  Intake             1970 ml  Output             1150 ml  Net              820 ml   Filed Weights   01/06/17 0500 01/07/17 0500 01/08/17 0712  Weight: 62.6 kg (138 lb 0.1 oz) 63.5 kg (139 lb 15.9 oz) 63.5 kg (139 lb 15.9 oz)    Examination: General: No acute respiratory distress despite agitation/?seizure Lungs: Clear to auscultation bilaterally - no wheezing  Cardiovascular: tachycardic but regular - no M   Abdomen: Nondistended, soft, bowel sounds positive, no rebound - PEG insertion site still closed externally  Extremities: No significant edema B LE - rhythmic flexing movements bilateral lower extremities noted  CBC:  Recent Labs Lab 01/04/17 0219 01/05/17 0841 01/06/17 0209 01/07/17 0248 01/08/17 1340  WBC 19.1* 9.9 9.4 7.2 8.8  NEUTROABS 17.4*  --   --   --   --   HGB 12.0* 9.2* 8.5* 8.1* 9.4*  HCT 37.1* 28.9* 27.0* 25.6* 29.7*  MCV 86.3 85.8 86.0 86.8 87.9  PLT 360 260 254 238 203   Basic Metabolic Panel:  Recent Labs Lab 01/04/17 0219 01/05/17 0841 01/06/17 0209 01/07/17 0248  NA 134* 139 142 143  K 4.7 4.1 4.1 4.1  CL 94* 105 110 114*  CO2 '27 25 25 23  '$ GLUCOSE 196* 112* 106* 89  BUN 24* '17 20 12  '$ CREATININE 0.73 0.59* 0.52* 0.57*  CALCIUM 10.0 9.1 8.8* 8.7*  MG  --  1.7  --   --   PHOS  --  1.5*  --   --    GFR: Estimated Creatinine Clearance: 91.5 mL/min (A) (by C-G formula based on SCr of 0.57 mg/dL (L)).  Liver Function Tests:  Recent Labs Lab 01/04/17 0219 01/06/17 0209 01/07/17 0248  AST 53* 33 33  ALT 34 22 22  ALKPHOS 109 67 62  BILITOT 0.5 0.7 1.1  PROT 9.2* 6.3* 6.2*  ALBUMIN 4.0 2.8* 2.8*   Cardiac Enzymes:  Recent Labs Lab  01/04/17 0219 01/04/17 0736 01/04/17 1414 01/04/17 1932  TROPONINI 0.04* 0.08* 0.07* 0.06*    CBG:  Recent Labs Lab 01/08/17 0006 01/08/17 0400 01/08/17 0735 01/08/17 1152 01/08/17 1401  GLUCAP 80 87 93 103* 96    Recent Results (from the past 240 hour(s))  Blood Culture (routine x 2)     Status: None (Preliminary result)   Collection Time: 01/04/17  2:19 AM  Result Value Ref Range Status   Specimen Description BLOOD LEFT ARM  Final   Special  Requests AEROBIC BOTTLE ONLY Blood Culture adequate volume  Final   Culture NO GROWTH 4 DAYS  Final   Report Status PENDING  Incomplete  Blood Culture (routine x 2)     Status: None (Preliminary result)   Collection Time: 01/04/17  2:35 AM  Result Value Ref Range Status   Specimen Description BLOOD LEFT HAND  Final   Special Requests AEROBIC BOTTLE ONLY Blood Culture adequate volume  Final   Culture NO GROWTH 4 DAYS  Final   Report Status PENDING  Incomplete  Culture, respiratory (NON-Expectorated)     Status: None   Collection Time: 01/04/17  6:05 AM  Result Value Ref Range Status   Specimen Description TRACHEAL ASPIRATE  Final   Special Requests NONE  Final   Gram Stain   Final    ABUNDANT WBC PRESENT,BOTH PMN AND MONONUCLEAR FEW GRAM NEGATIVE RODS    Culture MODERATE SERRATIA MARCESCENS  Final   Report Status 01/07/2017 FINAL  Final   Organism ID, Bacteria SERRATIA MARCESCENS  Final      Susceptibility   Serratia marcescens - MIC*    CEFAZOLIN >=64 RESISTANT Resistant     CEFEPIME <=1 SENSITIVE Sensitive     CEFTAZIDIME <=1 SENSITIVE Sensitive     CEFTRIAXONE <=1 SENSITIVE Sensitive     CIPROFLOXACIN <=0.25 SENSITIVE Sensitive     GENTAMICIN <=1 SENSITIVE Sensitive     TRIMETH/SULFA <=20 SENSITIVE Sensitive     * MODERATE SERRATIA MARCESCENS  MRSA PCR Screening     Status: Abnormal   Collection Time: 01/04/17  6:14 AM  Result Value Ref Range Status   MRSA by PCR POSITIVE (A) NEGATIVE Final    Comment:        The  GeneXpert MRSA Assay (FDA approved for NASAL specimens only), is one component of a comprehensive MRSA colonization surveillance program. It is not intended to diagnose MRSA infection nor to guide or monitor treatment for MRSA infections. RESULT CALLED TO, READ BACK BY AND VERIFIED WITH: Ferrel Logan RN 9:35 01/04/17 ([email or web address])   Culture, Urine     Status: Abnormal   Collection Time: 01/04/17  3:10 PM  Result Value Ref Range Status   Specimen Description URINE, RANDOM  Final   Special Requests NONE  Final   Culture MULTIPLE SPECIES PRESENT, SUGGEST RECOLLECTION (A)  Final   Report Status 01/05/2017 FINAL  Final     Scheduled Meds: . chlorhexidine gluconate (MEDLINE KIT)  15 mL Mouth Rinse BID  . heparin  5,000 Units Subcutaneous Q8H  . mouth rinse  15 mL Mouth Rinse QID  . mupirocin ointment  1 application Nasal BID    LOS: 4 days   Cherene Altes, MD [facility] Office  [phone] Pager - Text Page per Amion as per below:  On-Call/Text Page:      [email or web address]      password [email or web address]  If 7PM-7AM, please contact night-coverage [email or web address] Password [email or web address] 01/08/2017, 3:08 PM

## 2017-01-08 NOTE — Progress Notes (Signed)
Spoke with Josh in interventional radiology and re-schelduled PEG tube placement tentatively for tomorrow am. Will continue to monitor patient.

## 2017-01-09 ENCOUNTER — Inpatient Hospital Stay (HOSPITAL_COMMUNITY): Payer: Medicare Other

## 2017-01-09 DIAGNOSIS — T85598S Other mechanical complication of other gastrointestinal prosthetic devices, implants and grafts, sequela: Secondary | ICD-10-CM

## 2017-01-09 LAB — CULTURE, BLOOD (ROUTINE X 2)
CULTURE: NO GROWTH
CULTURE: NO GROWTH
SPECIAL REQUESTS: ADEQUATE
Special Requests: ADEQUATE

## 2017-01-09 LAB — COMPREHENSIVE METABOLIC PANEL
ALBUMIN: 3 g/dL — AB (ref 3.5–5.0)
ALT: 32 U/L (ref 17–63)
ANION GAP: 9 (ref 5–15)
AST: 60 U/L — ABNORMAL HIGH (ref 15–41)
Alkaline Phosphatase: 66 U/L (ref 38–126)
BILIRUBIN TOTAL: 0.6 mg/dL (ref 0.3–1.2)
BUN: <5 mg/dL — ABNORMAL LOW (ref 6–20)
CO2: 22 mmol/L (ref 22–32)
Calcium: 9 mg/dL (ref 8.9–10.3)
Chloride: 109 mmol/L (ref 101–111)
Creatinine, Ser: 0.51 mg/dL — ABNORMAL LOW (ref 0.61–1.24)
Glucose, Bld: 84 mg/dL (ref 65–99)
POTASSIUM: 4.1 mmol/L (ref 3.5–5.1)
Sodium: 140 mmol/L (ref 135–145)
TOTAL PROTEIN: 6.8 g/dL (ref 6.5–8.1)

## 2017-01-09 LAB — PROTIME-INR
INR: 1.24
PROTHROMBIN TIME: 15.7 s — AB (ref 11.4–15.2)

## 2017-01-09 LAB — CBC
HEMATOCRIT: 30 % — AB (ref 39.0–52.0)
HEMATOCRIT: 28.7 % — AB (ref 39.0–52.0)
HEMOGLOBIN: 9.8 g/dL — AB (ref 13.0–17.0)
Hemoglobin: 9.4 g/dL — ABNORMAL LOW (ref 13.0–17.0)
MCH: 28.7 pg (ref 26.0–34.0)
MCH: 28.4 pg (ref 26.0–34.0)
MCHC: 32.7 g/dL (ref 30.0–36.0)
MCHC: 32.8 g/dL (ref 30.0–36.0)
MCV: 87.7 fL (ref 78.0–100.0)
MCV: 86.7 fL (ref 78.0–100.0)
Platelets: 268 10*3/uL (ref 150–400)
Platelets: 284 10*3/uL (ref 150–400)
RBC: 3.42 MIL/uL — AB (ref 4.22–5.81)
RBC: 3.31 MIL/uL — ABNORMAL LOW (ref 4.22–5.81)
RDW: 16.9 % — ABNORMAL HIGH (ref 11.5–15.5)
RDW: 16.9 % — AB (ref 11.5–15.5)
WBC: 9.1 10*3/uL (ref 4.0–10.5)
WBC: 9 10*3/uL (ref 4.0–10.5)

## 2017-01-09 LAB — GLUCOSE, CAPILLARY
GLUCOSE-CAPILLARY: 85 mg/dL (ref 65–99)
GLUCOSE-CAPILLARY: 90 mg/dL (ref 65–99)
Glucose-Capillary: 75 mg/dL (ref 65–99)
Glucose-Capillary: 81 mg/dL (ref 65–99)
Glucose-Capillary: 84 mg/dL (ref 65–99)
Glucose-Capillary: 88 mg/dL (ref 65–99)
Glucose-Capillary: 92 mg/dL (ref 65–99)

## 2017-01-09 LAB — BASIC METABOLIC PANEL
Anion gap: 9 (ref 5–15)
CALCIUM: 8.9 mg/dL (ref 8.9–10.3)
CO2: 23 mmol/L (ref 22–32)
Chloride: 108 mmol/L (ref 101–111)
Creatinine, Ser: 0.54 mg/dL — ABNORMAL LOW (ref 0.61–1.24)
GFR calc Af Amer: >60 mL/min (ref >60)
GLUCOSE: 90 mg/dL (ref 65–99)
Potassium: 3.9 mmol/L (ref 3.5–5.1)
Sodium: 140 mmol/L (ref 135–145)

## 2017-01-09 LAB — APTT: aPTT: 37 seconds — ABNORMAL HIGH (ref 24–36)

## 2017-01-09 NOTE — Progress Notes (Signed)
Patient continued to pull at vent tubing until tubing came off very restless.  Medicated with 2mg  Versed.

## 2017-01-09 NOTE — Progress Notes (Signed)
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 He was ventilator dependent after his initial TBI for a few years then he did not require vent support until 2-3 years ago when he needed to be intubated.  He has been vent dependent since (able to tolerate TC intermittently). He was transferred from [facility] on 4/27 after being found unresponsive and apneic with brown/tan secretions through trach site.  He was bagged for a couple of minutes and placed on the vent. Upon arrival to ED patient was a GCS of 3, baseline mental status is involuntary movements in the upper extremities and ability to answer yes/no questions. CT Head Negative. CXR with bilateral peribronchial opacities.   Significant Events: 4/27 transfer to ED from [facility] 4/28 [place] assumed care  4/29 pulled out his own PEG 5/1 seizure episode v/s agitation   Subjective: No acute events overnight. He does get periods of agitation.    Assessment & Plan:  Acute on Chronic Ventilator Dependent Respiratory Failure s/p Tracheostomy +/-Aspiration  Ongoing care of vent and trach per PCCM - pt removed his own trach on 4/27; it was replaced by PCCM w/ a #8 cuffed shiley - he appears stable from the resp standpoint   H/O MDR Stenotrophomonas maltophilia, MDR Pseudomonas aeruginosa - Serratia in trach aspirate  Antibiotics directed by ID service - no fever and WBC has normalized - to complete 8 days of Merrem treatment. Last day 5/3  Chronic Hypotension  Continue home midodrine - BP normal at this time   Chronic Diastolic Heart Failure  EF 65-70% via TTE Sept 2017 - dry weight appears to be approximately 72 kg - well compensated at this time    [employer]   01/07/17 0500 01/08/17 0712 01/09/17 0500  Weight: 63.5 kg (139 lb 15.9 oz) 63.5 kg (139 lb 15.9 oz) 63.5 kg (140 lb)    Chronic A.fib Continue amiodarone - normal sinus rhythm today except for episode of sinus tachy during possible seizure activity   Neurogenic Bladder  foley in place     Dysphagia s/p PEG pulled out his own PEG 4/29 - unfortunately action was not taken immediately, and his fistula tract was not able to be intubated blindly - I have asked Radiology to assist in replacing his PEG and this is to be scheduled for 5/2 (postponed from 5/1 due to seizure type activity). Will restart tube feed after. Will have nutrition team see him.   Anemia of Chronic Disease  Drop in hemoglobin since admission likely related to volume resuscitation - no evidence of acute blood loss - has stabilized   DM2   Well controlled/stable   Chronic Post Traumatic Encephalopathy +/- Metabolic Encephalopathy  Appears to be at his baseline mental status  Quadriplegia, Traumatic Brain Injury as a child  Seizure    Some seizure activity noted 4/28 and possibly today, though this is difficult to distinguish from agitation - Keppra has been increased during this hospital stay - continue Vimpat - when necessary Versed - appears stable at present   Uncontrolled Agitation- can use low dose haldol. Avoid over sedation. Sitter if necessary   MRSA screen +   DVT prophylaxis: SQ heparin  Code Status: NO CPR, NO  DEFIB, NO ACLS drugs - ok to use vent  Family Communication: no family present at time of exam  Disposition Plan: SDU on vent   Consultants:  PCCM ID  Antimicrobials:  Meropenem 4/26 > Linezolid 4/26 > 4/29 Zosyn 4/27   Objective: Blood pressure 125/85, pulse 91, temperature 99.3 F (37.4 C), temperature source Oral, resp. rate (!) 22, height 5' 10" (1.778 m), weight 63.5 kg (140 lb), SpO2 99 %.  Intake/Output Summary (Last 24 hours) at 01/09/17 1340 Last data filed  at 01/09/17 1232  Gross per 24 hour  Intake             1485 ml  Output             2600 ml  Net            -1115 ml   Filed Weights   01/07/17 0500 01/08/17 0712 01/09/17 0500  Weight: 63.5 kg (139 lb 15.9 oz) 63.5 kg (139 lb 15.9 oz) 63.5 kg (140 lb)    Examination: General: No acute respiratory distress despite agitation/?seizure Lungs: Clear to auscultation bilaterally - no wheezing  Cardiovascular: tachycardic but regular - no M   Abdomen: Nondistended, soft, bowel sounds positive, no rebound - PEG insertion site still closed externally  Extremities: No significant edema B LE - rhythmic flexing movements bilateral lower extremities noted  CBC:  Recent Labs Lab 01/04/17 0219 01/05/17 0841 01/06/17 0209 01/07/17 0248 01/08/17 1340 01/09/17 0325  WBC 19.1* 9.9 9.4 7.2 8.8 9.1  NEUTROABS 17.4*  --   --   --   --   --   HGB 12.0* 9.2* 8.5* 8.1* 9.4* 9.8*  HCT 37.1* 28.9* 27.0* 25.6* 29.7* 30.0*  MCV 86.3 85.8 86.0 86.8 87.9 87.7  PLT 360 260 254 238 281 630   Basic Metabolic Panel:  Recent Labs Lab 01/04/17 0219 01/05/17 0841 01/06/17 0209 01/07/17 0248 01/09/17 0325  NA 134* 139 142 143 140  K 4.7 4.1 4.1 4.1 4.1  CL 94* 105 110 114* 109  CO2 _0 GLUCOSE 196* 112* 106* 89 84  BUN 24* _1 <5*  CREATININE 0.73 0.59* 0.52* 0.57* 0.51*  CALCIUM 10.0 9.1 8.8* 8.7* 9.0  MG  --  1.7  --   --   --   PHOS  --  1.5*  --   --   --    GFR: Estimated Creatinine Clearance: 91.5 mL/min (A) (by C-G formula based on SCr of 0.51 mg/dL (L)).  Liver Function Tests:  Recent Labs Lab 01/04/17 0219 01/06/17 0209 01/07/17 0248 01/09/17 0325  AST 53* 33 33 60*  ALT 34 22 22 32  ALKPHOS 109 67 62 66  BILITOT 0.5 0.7 1.1 0.6  PROT 9.2* 6.3* 6.2* 6.8  ALBUMIN 4.0 2.8* 2.8* 3.0*   Cardiac Enzymes:  Recent Labs Lab 01/04/17 0219 01/04/17 0736 01/04/17 1414 01/04/17 1932  TROPONINI 0.04* 0.08* 0.07* 0.06*    CBG:  Recent Labs Lab  01/08/17 1948 01/09/17 0019 01/09/17 0422 01/09/17 0744 01/09/17 1212  GLUCAP 97 81 85 92 90    Recent Results (from the past 240 hour(s))  Blood Culture (routine x 2)     Status: None   Collection Time: 01/04/17  2:19 AM  Result Value Ref Range Status   Specimen Description BLOOD LEFT ARM  Final   Special Requests AEROBIC BOTTLE ONLY Blood Culture adequate volume  Final   Culture NO GROWTH 5 DAYS  Final  Report Status 01/09/2017 FINAL  Final  Blood Culture (routine x 2)     Status: None   Collection Time: 01/04/17  2:35 AM  Result Value Ref Range Status   Specimen Description BLOOD LEFT HAND  Final   Special Requests AEROBIC BOTTLE ONLY Blood Culture adequate volume  Final   Culture NO GROWTH 5 DAYS  Final   Report Status 01/09/2017 FINAL  Final  Culture, respiratory (NON-Expectorated)     Status: None   Collection Time: 01/04/17  6:05 AM  Result Value Ref Range Status   Specimen Description TRACHEAL ASPIRATE  Final   Special Requests NONE  Final   Gram Stain   Final    ABUNDANT WBC PRESENT,BOTH PMN AND MONONUCLEAR FEW GRAM NEGATIVE RODS    Culture MODERATE SERRATIA MARCESCENS  Final   Report Status 01/07/2017 FINAL  Final   Organism ID, Bacteria SERRATIA MARCESCENS  Final      Susceptibility   Serratia marcescens - MIC*    CEFAZOLIN >=64 RESISTANT Resistant     CEFEPIME <=1 SENSITIVE Sensitive     CEFTAZIDIME <=1 SENSITIVE Sensitive     CEFTRIAXONE <=1 SENSITIVE Sensitive     CIPROFLOXACIN <=0.25 SENSITIVE Sensitive     GENTAMICIN <=1 SENSITIVE Sensitive     TRIMETH/SULFA <=20 SENSITIVE Sensitive     * MODERATE SERRATIA MARCESCENS  MRSA PCR Screening     Status: Abnormal   Collection Time: 01/04/17  6:14 AM  Result Value Ref Range Status   MRSA by PCR POSITIVE (A) NEGATIVE Final    Comment:        The GeneXpert MRSA Assay (FDA approved for NASAL specimens only), is one component of a comprehensive MRSA colonization surveillance program. It is not intended to  diagnose MRSA infection nor to guide or monitor treatment for MRSA infections. RESULT CALLED TO, READ BACK BY AND VERIFIED WITH: Ferrel Logan RN 9:35 01/04/17 ([email or web address])   Culture, Urine     Status: Abnormal   Collection Time: 01/04/17  3:10 PM  Result Value Ref Range Status   Specimen Description URINE, RANDOM  Final   Special Requests NONE  Final   Culture MULTIPLE SPECIES PRESENT, SUGGEST RECOLLECTION (A)  Final   Report Status 01/05/2017 FINAL  Final     Scheduled Meds: . chlorhexidine gluconate (MEDLINE KIT)  15 mL Mouth Rinse BID  . heparin  5,000 Units Subcutaneous Q8H  . mouth rinse  15 mL Mouth Rinse QID    LOS: 5 days   Gerlean Ren, MD [facility] Office  [phone] Pager - Text Page per Shea Evans as per below:  On-Call/Text Page:      [email or web address]      password [email or web address]  If 7PM-7AM, please contact night-coverage [email or web address] Password [email or web address] 01/09/2017, 1:40 PM

## 2017-01-09 NOTE — Progress Notes (Signed)
Patient brought to radiology department for G-tube replacement.  Upon assessment found that tract has started to heal and that blind replacement is not possible.  Per Dr. Miles Costain recommendation, new CT Abd has been ordered for anatomy review.  Once approved for IR placement based on anatomy, will proceed with new placement of G-tube.  Patient returned to unit.   Loyce Dys, MS RD PA-C 4:43 PM

## 2017-01-09 NOTE — Progress Notes (Signed)
Pt. Was transported to IR & back to 4E17 without any complications.

## 2017-01-09 NOTE — Progress Notes (Signed)
Patient now scheduled for Peg placement at 2:30 In IR with respiratory and SWAT RN to facilitate.

## 2017-01-10 LAB — PROTIME-INR
INR: 1.29
PROTHROMBIN TIME: 16.2 s — AB (ref 11.4–15.2)

## 2017-01-10 LAB — GLUCOSE, CAPILLARY
GLUCOSE-CAPILLARY: 88 mg/dL (ref 65–99)
GLUCOSE-CAPILLARY: 90 mg/dL (ref 65–99)
Glucose-Capillary: 89 mg/dL (ref 65–99)
Glucose-Capillary: 86 mg/dL (ref 65–99)
Glucose-Capillary: 94 mg/dL (ref 65–99)
Glucose-Capillary: 84 mg/dL (ref 65–99)

## 2017-01-10 LAB — BASIC METABOLIC PANEL
Anion gap: 9 (ref 5–15)
CALCIUM: 8.8 mg/dL — AB (ref 8.9–10.3)
CO2: 23 mmol/L (ref 22–32)
Chloride: 107 mmol/L (ref 101–111)
Creatinine, Ser: 0.51 mg/dL — ABNORMAL LOW (ref 0.61–1.24)
GFR calc Af Amer: >60 mL/min (ref >60)
GLUCOSE: 90 mg/dL (ref 65–99)
Potassium: 3.9 mmol/L (ref 3.5–5.1)
SODIUM: 139 mmol/L (ref 135–145)

## 2017-01-10 LAB — CBC
HCT: 28.1 % — ABNORMAL LOW (ref 39.0–52.0)
Hemoglobin: 9 g/dL — ABNORMAL LOW (ref 13.0–17.0)
MCH: 27.6 pg (ref 26.0–34.0)
MCHC: 32 g/dL (ref 30.0–36.0)
MCV: 86.2 fL (ref 78.0–100.0)
PLATELETS: 281 10*3/uL (ref 150–400)
RBC: 3.26 MIL/uL — ABNORMAL LOW (ref 4.22–5.81)
RDW: 16.6 % — AB (ref 11.5–15.5)
WBC: 8.7 10*3/uL (ref 4.0–10.5)

## 2017-01-10 LAB — APTT: aPTT: 37 seconds — ABNORMAL HIGH (ref 24–36)

## 2017-01-10 MED ORDER — HEPARIN SODIUM (PORCINE) 5000 UNIT/ML IJ SOLN: 5000.0000 [IU] | Freq: Three times a day (TID) | INTRAMUSCULAR | Status: DC

## 2017-01-10 NOTE — Progress Notes (Signed)
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 He was ventilator dependent after his initial TBI for a few years then he did not require vent support until 2-3 years ago when he needed to be intubated.  He has been vent dependent since (able to tolerate TC intermittently). He was transferred from [place] on 4/27 after being found unresponsive and apneic with brown/tan secretions through trach site.  He was bagged for a couple of minutes and placed on the vent. Upon arrival to ED patient was a GCS of 3, baseline mental status is involuntary movements in the upper extremities and ability to answer yes/no questions. CT Head Negative. CXR with bilateral peribronchial opacities.   Significant Events: 4/27 transfer to ED from [place] 4/28 [place] assumed care  4/29 pulled out his own PEG 5/1 seizure episode v/s agitation   Subjective: No acute events overnight. Didn't get peg yesterday as Radiology requested CT.    Assessment & Plan:  Acute on Chronic Ventilator Dependent Respiratory Failure s/p Tracheostomy +/-Aspiration  Ongoing care of vent and trach per PCCM - pt removed his own trach on 4/27; it was replaced by PCCM w/ a #8 cuffed shiley - he appears stable from the resp standpoint   H/O MDR Stenotrophomonas maltophilia, MDR Pseudomonas aeruginosa - Serratia in trach aspirate  Antibiotics directed by ID service - no fever and WBC has normalized -  -completed Merrem today 5/3  Chronic Hypotension  Continue home midodrine - BP normal at this time   Chronic Diastolic Heart Failure  EF 65-70% via TTE Sept 2017 - dry weight appears to be approximately 72 kg - well compensated at this time  [facility]   01/07/17 0500 01/08/17 0712 01/09/17 0500  Weight: 63.5 kg (139 lb 15.9 oz) 63.5 kg (139 lb 15.9 oz) 63.5 kg (140 lb)    Chronic A.fib Continue amiodarone - normal sinus rhythm today except for episode of sinus tachy during possible seizure activity   Neurogenic Bladder  foley in place     Dysphagia s/p PEG pulled out his own PEG 4/29 - unfortunately action was not taken immediately, and his fistula tract was not able to be intubated blindly -Hopefully IR will be able to place peg today   Anemia of Chronic Disease  Drop in hemoglobin since admission likely related to volume resuscitation - no evidence of acute blood loss - has stabilized   DM2   Well controlled/stable   Chronic Post Traumatic Encephalopathy +/- Metabolic Encephalopathy  Appears to be at his baseline mental status  Quadriplegia, Traumatic Brain Injury as a child  Seizure    Some seizure activity noted 4/28 and possibly today, though this is difficult to distinguish from agitation - Keppra has been increased during this hospital stay - continue Vimpat - when necessary Versed - appears stable at present   Uncontrolled Agitation- can use low dose haldol. Avoid over sedation. Sitter if necessary   MRSA screen +   DVT prophylaxis: SQ heparin  Code Status: NO CPR, NO DEFIB, NO ACLS drugs - ok to use vent  Family Communication: no family present at time of exam  Disposition Plan: Discharge to [facility] after peg placement.   Consultants:  PCCM  ID  Antimicrobials:  Meropenem 4/26 > Linezolid 4/26 > 4/29 Zosyn 4/27   Objective: Blood pressure 125/90, pulse 96, temperature 98.9 F (37.2 C), temperature source Axillary, resp. rate 18, height 5' 10" (1.778 m), weight 63.5 kg (140 lb), SpO2 100 %.  Intake/Output Summary (Last 24 hours) at 01/10/17 1305 Last data filed at 01/10/17 1206  Gross per 24 hour  Intake             2556 ml  Output              825 ml  Net             1731 ml   Filed Weights    01/07/17 0500 01/08/17 0712 01/09/17 0500  Weight: 63.5 kg (139 lb 15.9 oz) 63.5 kg (139 lb 15.9 oz) 63.5 kg (140 lb)    Examination: General: No acute respiratory distress despite agitation/?seizure Lungs: Clear to auscultation bilaterally - no wheezing  Cardiovascular: tachycardic but regular - no M   Abdomen: Nondistended, soft, bowel sounds positive, no rebound - PEG insertion site still closed externally  Extremities: No significant edema B LE - rhythmic flexing movements bilateral lower extremities noted  CBC:  Recent Labs Lab 01/04/17 0219  01/07/17 0248 01/08/17 1340 01/09/17 0325 01/09/17 1846 01/10/17 0253  WBC 19.1*  < > 7.2 8.8 9.1 9.0 8.7  NEUTROABS 17.4*  --   --   --   --   --   --   HGB 12.0*  < > 8.1* 9.4* 9.8* 9.4* 9.0*  HCT 37.1*  < > 25.6* 29.7* 30.0* 28.7* 28.1*  MCV 86.3  < > 86.8 87.9 87.7 86.7 86.2  PLT 360  < > 238 281 268 284 281  < > = values in this interval not displayed. Basic Metabolic Panel:  Recent Labs Lab 01/05/17 0841 01/06/17 0209 01/07/17 0248 01/09/17 0325 01/09/17 1846 01/10/17 0253  NA 139 142 143 140 140 139  K 4.1 4.1 4.1 4.1 3.9 3.9  CL 105 110 114* 109 108 107  CO2 _0 GLUCOSE 112* 106* 89 84 90 90  BUN _1 <5* <5* <5*  CREATININE 0.59* 0.52* 0.57* 0.51* 0.54* 0.51*  CALCIUM 9.1 8.8* 8.7* 9.0 8.9 8.8*  MG 1.7  --   --   --   --   --   PHOS 1.5*  --   --   --   --   --    GFR: Estimated Creatinine Clearance: 91.5 mL/min (A) (by C-G formula based on SCr of 0.51 mg/dL (L)).  Liver Function Tests:  Recent Labs Lab 01/04/17 0219 01/06/17 0209 01/07/17 0248 01/09/17 0325  AST 53* 33 33 60*  ALT 34 22 22 32  ALKPHOS 109 67 62 66  BILITOT 0.5 0.7 1.1 0.6  PROT 9.2* 6.3* 6.2* 6.8  ALBUMIN 4.0 2.8* 2.8* 3.0*   Cardiac Enzymes:  Recent Labs Lab 01/04/17 0219 01/04/17 0736 01/04/17 1414 01/04/17 1932  TROPONINI 0.04* 0.08* 0.07* 0.06*    CBG:  Recent Labs Lab 01/09/17 1734  01/09/17 2003 01/09/17 2357 01/10/17 0429 01/10/17 0731  GLUCAP 88 84 75 88 89    Recent Results (from the past 240 hour(s))  Blood Culture (routine x 2)     Status: None   Collection Time: 01/04/17  2:19 AM  Result Value Ref Range Status   Specimen Description BLOOD LEFT ARM  Final   Special Requests AEROBIC BOTTLE ONLY Blood  Culture adequate volume  Final   Culture NO GROWTH 5 DAYS  Final   Report Status 01/09/2017 FINAL  Final  Blood Culture (routine x 2)     Status: None   Collection Time: 01/04/17  2:35 AM  Result Value Ref Range Status   Specimen Description BLOOD LEFT HAND  Final   Special Requests AEROBIC BOTTLE ONLY Blood Culture adequate volume  Final   Culture NO GROWTH 5 DAYS  Final   Report Status 01/09/2017 FINAL  Final  Culture, respiratory (NON-Expectorated)     Status: None   Collection Time: 01/04/17  6:05 AM  Result Value Ref Range Status   Specimen Description TRACHEAL ASPIRATE  Final   Special Requests NONE  Final   Gram Stain   Final    ABUNDANT WBC PRESENT,BOTH PMN AND MONONUCLEAR FEW GRAM NEGATIVE RODS    Culture MODERATE SERRATIA MARCESCENS  Final   Report Status 01/07/2017 FINAL  Final   Organism ID, Bacteria SERRATIA MARCESCENS  Final      Susceptibility   Serratia marcescens - MIC*    CEFAZOLIN >=64 RESISTANT Resistant     CEFEPIME <=1 SENSITIVE Sensitive     CEFTAZIDIME <=1 SENSITIVE Sensitive     CEFTRIAXONE <=1 SENSITIVE Sensitive     CIPROFLOXACIN <=0.25 SENSITIVE Sensitive     GENTAMICIN <=1 SENSITIVE Sensitive     TRIMETH/SULFA <=20 SENSITIVE Sensitive     * MODERATE SERRATIA MARCESCENS  MRSA PCR Screening     Status: Abnormal   Collection Time: 01/04/17  6:14 AM  Result Value Ref Range Status   MRSA by PCR POSITIVE (A) NEGATIVE Final    Comment:        The GeneXpert MRSA Assay (FDA approved for NASAL specimens only), is one component of a comprehensive MRSA colonization surveillance program. It is not intended to diagnose  MRSA infection nor to guide or monitor treatment for MRSA infections. RESULT CALLED TO, READ BACK BY AND VERIFIED WITH: Ferrel Logan RN 9:35 01/04/17 ([email or web address])   Culture, Urine     Status: Abnormal   Collection Time: 01/04/17  3:10 PM  Result Value Ref Range Status   Specimen Description URINE, RANDOM  Final   Special Requests NONE  Final   Culture MULTIPLE SPECIES PRESENT, SUGGEST RECOLLECTION (A)  Final   Report Status 01/05/2017 FINAL  Final     Scheduled Meds: . chlorhexidine gluconate (MEDLINE KIT)  15 mL Mouth Rinse BID  . [START ON 01/11/2017] heparin  5,000 Units Subcutaneous Q8H  . mouth rinse  15 mL Mouth Rinse QID    LOS: 6 days   Gerlean Ren, MD [facility] Office  [phone] Pager - Text Page per Shea Evans as per below:  On-Call/Text Page:      [email or web address]      password [email or web address]  If 7PM-7AM, please contact night-coverage [email or web address] Password [email or web address] 01/10/2017, 1:05 PM

## 2017-01-10 NOTE — Progress Notes (Addendum)
Per MD patient will be stable to leave tomorrow following PEG tube placement- CSW spoke with [facility]- they anticipate having a DC tomorrow and would be able to accept patient- no issues accepting same day as PEG placement  CSW will continue to follow  Patient will need EMTALA form and medical necessity form for transfer back to SNF by [facility] [phone])  Plan to be admitted to room 304 under Dr. Beverely Risen  CSW will continue to follow and assist with transfer back to [facility] tomorrow  Burna Sis, LCSW Clinical Social Worker [phone]

## 2017-01-10 NOTE — Consult Note (Signed)
Chief Complaint: Patient was seen in consultation today for percutaneous gastric tube placement vs replacement/exchange Chief Complaint  Patient presents with  . Unresponsive  . Aspiration   at the request of Dr Leone Haven  Referring Physician(s): Dr Leone Haven  Supervising Physician: Richarda Overlie  Patient Status: [facility] - In-pt  History of Present Illness: Dean Graham is a 56 y.o. male   TBI as child Dysphagia; Seizure disorder Long term care Chronic trach/vent Lives at [facility] Pulled out Gastric tube 4/29 pm  Scheduled now for replacement/exchange vs new placement of percutaneous gastric tube CT scan reviewed with Dr Ainsley Spinner procedure   Past Medical History:  Diagnosis Date  . Atrial fibrillation (HCC)   . CHF (congestive heart failure) (HCC)   . Chronic respiratory failure (HCC)   . Diabetes mellitus without complication (HCC)   . Diastolic heart failure (HCC)   . Dysphagia   . GERD (gastroesophageal reflux disease)   . Quadriplegia (HCC)   . Seizures (HCC)     History reviewed. No pertinent surgical history.  Allergies: Ativan [lorazepam]; Azithromycin; Ceftriaxone; and Vancomycin  Medications: Prior to Admission medications   Medication Sig Start Date End Date Taking? Authorizing Provider  acetaminophen (TYLENOL) 325 MG tablet Place 2 tablets (650 mg total) into feeding tube every 6 (six) hours as needed for mild pain, moderate pain or fever. 06/18/16  Yes Elease Etienne, MD  amiodarone (PACERONE) 100 MG tablet Place 100 mg into feeding tube daily. 08/26/15  Yes Historical Provider, MD  chlorhexidine (PERIDEX) 0.12 % solution Use as directed 15 mLs in the mouth or throat 2 (two) times daily.   Yes Historical Provider, MD  Eyelid Cleansers (OCUSOFT LID SCRUB) PADS Apply 1 each topically 2 (two) times daily.   Yes Historical Provider, MD  famotidine (PEPCID) 20 MG tablet Place 1 tablet (20 mg total) into feeding tube 2 (two) times daily. Patient taking  differently: Place 20 mg into feeding tube at bedtime.  06/18/16  Yes Elease Etienne, MD  furosemide (LASIX) 40 MG tablet Place 40 mg into feeding tube daily.   Yes Historical Provider, MD  levETIRAcetam (KEPPRA) 100 MG/ML solution Place 5 mLs (500 mg total) into feeding tube 2 (two) times daily. Patient taking differently: Place 250-500 mg into feeding tube See admin instructions. Take 250 mg by mouth in the morning and take 500 mg by mouth in the evening. 06/18/16  Yes Elease Etienne, MD  lidocaine (XYLOCAINE) 2 % solution Use as directed 20 mLs in the mouth or throat as needed for mouth pain.   Yes Historical Provider, MD  midodrine (PROAMATINE) 10 MG tablet Place 10 mg into feeding tube every 8 (eight) hours. 05/25/16  Yes Historical Provider, MD  Multiple Vitamin (MULTIVITAMIN WITH MINERALS) TABS tablet Place 1 tablet into feeding tube daily. 06/18/16  Yes Elease Etienne, MD  phenazopyridine (PYRIDIUM) 200 MG tablet Take 200 mg by mouth 2 (two) times daily.   Yes Historical Provider, MD  saccharomyces boulardii (FLORASTOR) 250 MG capsule Take 1 capsule (250 mg total) by mouth 2 (two) times daily. Via Tube. 06/18/16  Yes Elease Etienne, MD  traMADol (ULTRAM) 50 MG tablet Take 50 mg by mouth 2 (two) times daily.   Yes Historical Provider, MD  vitamin C (ASCORBIC ACID) 500 MG tablet Place 1 tablet (500 mg total) into feeding tube daily. Patient taking differently: Place 500 mg into feeding tube 2 (two) times daily.  06/18/16  Yes Maximino Greenland  Hongalgi, MD  albuterol (PROVENTIL) (2.5 MG/3ML) 0.083% nebulizer solution Take 2.5 mg by nebulization every 6 (six) hours as needed for wheezing or shortness of breath.    Historical Provider, MD  Lacosamide (VIMPAT) 100 MG TABS Place 1 tablet (100 mg total) into feeding tube 2 (two) times daily. Patient not taking: Reported on 01/04/2017 06/18/16   Elease Etienne, MD  loperamide (IMODIUM) 1 MG/5ML solution Place 5 mLs (1 mg total) into feeding tube 2 (two) times  daily as needed for diarrhea or loose stools. Patient not taking: Reported on 01/04/2017 06/18/16   Elease Etienne, MD  Nutritional Supplements (FEEDING SUPPLEMENT, VITAL AF 1.2 CAL,) LIQD Place 1,000 mLs into feeding tube continuous. 1,000 mL, Per Tube, at 70 mL/hr, Continuous 06/18/16   Elease Etienne, MD  Water For Irrigation, Sterile (FREE WATER) SOLN Place 200 mLs into feeding tube every 8 (eight) hours. 06/18/16   Elease Etienne, MD     History reviewed. No pertinent family history.  Social History   Social History  . Marital status: Single    Spouse name: N/A  . Number of children: N/A  . Years of education: N/A   Social History Main Topics  . Smoking status: Never Smoker  . Smokeless tobacco: Never Used  . Alcohol use No  . Drug use: No  . Sexual activity: No   Other Topics Concern  . None   Social History Narrative  . None    Review of Systems: A 12 point ROS discussed and pertinent positives are indicated in the HPI above.  All other systems are negative.  Review of Systems  Constitutional:       No response  Respiratory:       Vent/trach    Vital Signs: BP 133/89 (BP Location: Left Arm)   Pulse 88   Temp 97.3 F (36.3 C) (Axillary)   Resp (!) 9   Ht 5\' 10"  (1.778 m)   Wt 140 lb (63.5 kg)   SpO2 100%   BMI 20.09 kg/m   Physical Exam  Cardiovascular: Normal rate.   Pulmonary/Chest:  vent  Abdominal: Soft.  Skin: Skin is warm and dry.  Psychiatric:  No response No movement Consent obtained via phone with brother Dean Graham  Nursing note and vitals reviewed.   Mallampati Score:  MD Evaluation Airway: Other (comments) Airway comments: trach/vent Heart: WNL Abdomen: WNL Other Pertinent Findings: trach/vent ASA  Classification: 3 Mallampati/Airway Score: Three  Imaging: Ct Abdomen Wo Contrast  Result Date: 01/10/2017 CLINICAL DATA:  56 year old male with a history of dysphagia. EXAM: CT ABDOMEN WITHOUT CONTRAST TECHNIQUE: Multidetector  CT imaging of the abdomen was performed following the standard protocol without IV contrast. COMPARISON:  No prior CT FINDINGS: Lower chest: Bilateral pleural effusions with associated atelectatic lung. Respiratory motion somewhat limits evaluation. Punctate hyperdensities of the visualize right lung in a peribronchovascular distribution, potentially from priors episodes of aspiration. Small amount of debris within the central airway is. Left not well visualized. No significant pericardial fluid/ thickening. Hepatobiliary: Rounded low-density lesion within the right liver measures 12 mm (image 26 of series 4). Unremarkable gallbladder. Pancreas: Unremarkable pancreas Spleen: Unremarkable spleen Adrenals/Urinary Tract: Unremarkable bilateral adrenal glands. Unremarkable bilateral kidneys. Stomach/Bowel: Decompressed stomach lumen. Transverse colon extends to the left subdiaphragmatic region, interposed between stomach, liver, an the anterior abdominal wall. Below the transverse colon, there is evidence of prior percutaneous gastrostomy placement and gastropexy, with 1 and perhaps 2 sites of percutaneous access through the abdominal wall.  No radiopaque foreign body. Vascular/Lymphatic: No significant vascular calcifications. No adenopathy. Other: No abdominal wall hernia. Musculoskeletal: No displaced fracture. No significant degenerative changes. IMPRESSION: Bilateral pleural effusions with associated atelectasis. Small hyperdense foci of the visualized right lung in a peribronchovascular distribution, potentially from prior episodes of aspiration. CT demonstrates evidence of prior percutaneous gastrostomy tube placement, with at least 1 and perhaps 2 sites of gastropexy. Note that the transverse colon completely overlies the stomach, interposed between the anterior stomach/left liver lobe, and the anterior abdominal wall. Electronically Signed   By: Gilmer Mor D.O.   On: 01/10/2017 08:37   Ct Head Wo  Contrast  Result Date: 01/04/2017 CLINICAL DATA:  56 year old male with altered mental status. EXAM: CT HEAD WITHOUT CONTRAST TECHNIQUE: Contiguous axial images were obtained from the base of the skull through the vertex without intravenous contrast. COMPARISON:  None. FINDINGS: Brain: There is no acute intracranial hemorrhage. Areas of low attenuation and encephalomalacia noted in the frontal lobes bilaterally and right greater left likely related to old infarcts. Right posterior parietal approach catheter along the sylvian fissure noted. There is mild generalized brain atrophy. There is mild dilatation of the ventricular system which may be related to volume loss or represent NPH. Direct comparison with prior images, if available, is recommended to evaluate for interval change. There is atrophy of the cerebellum. Vascular: No hyperdense vessel or unexpected calcification. Skull: Right frontoparietal craniotomy. No acute calvarial fracture. Sinuses/Orbits: Mucoperiosteal thickening of paranasal sinuses. No air-fluid levels. There is opacification of the mastoid air cells bilaterally, left greater right. Other: None IMPRESSION: 1. No acute intracranial hemorrhage. 2. Age-related atrophy. Mild dilatation of the ventricular system may be related to volume loss or represent NPH. Correlation with clinical exam recommended. Direct comparison with prior images, if available, recommended to evaluate for interval change. 3. 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 4. Right frontoparietal craniotomy and shunt catheter along the right sylvian fissure. Electronically Signed   By: Elgie Collard M.D.   On: 01/04/2017 04:37   Dg Chest Port 1 View  Result Date: 01/05/2017 CLINICAL DATA:  Ventilator dependent. EXAM: PORTABLE CHEST 1 VIEW COMPARISON:  01/04/2017. FINDINGS: Cardiac enlargement. Tracheostomy apparent good position. Marked worsening aeration with decreased lung volumes, and bibasilar  opacities representing atelectasis, possible infiltrate, and effusions. No pneumothorax. IMPRESSION: Marked worsening aeration. BILATERAL lung opacities have progressed since yesterday's radiograph; concern raised for developing pneumonia. Electronically Signed   By: Elsie Stain M.D.   On: 01/05/2017 07:46   Dg Chest Port 1 View  Result Date: 01/04/2017 CLINICAL DATA:  Respiratory failure EXAM: PORTABLE CHEST 1 VIEW COMPARISON:  Chest radiograph 06/12/2016 FINDINGS: The tip of the tracheostomy tube is just below the level of the clavicles. Cardiomediastinal contours are normal. Sequela of remote barium aspiration are again noted in the right lung. There are bilateral peribronchial opacities, likely mild pulmonary edema. There is no lobar consolidation. IMPRESSION: Tracheostomy tube tip just below the level of the clavicles and 5 cm above the inferior margin of the carina. Bilateral peribronchial opacities, likely mild pulmonary edema. No large area of consolidation. Electronically Signed   By: Deatra Robinson M.D.   On: 01/04/2017 03:06    Labs:  CBC:  Recent Labs  01/08/17 1340 01/09/17 0325 01/09/17 1846 01/10/17 0253  WBC 8.8 9.1 9.0 8.7  HGB 9.4* 9.8* 9.4* 9.0*  HCT 29.7* 30.0* 28.7* 28.1*  PLT 281 268 284 281    COAGS:  Recent Labs  05/29/16 1608 06/12/16  1831 01/09/17 1846 01/10/17 0253  INR 1.84 1.65 1.24 1.29  APTT 37*  --  37* 37*    BMP:  Recent Labs  01/07/17 0248 01/09/17 0325 01/09/17 1846 01/10/17 0253  NA 143 140 140 139  K 4.1 4.1 3.9 3.9  CL 114* 109 108 107  CO2 23 22 23 23   GLUCOSE 89 84 90 90  BUN 12 <5* <5* <5*  CALCIUM 8.7* 9.0 8.9 8.8*  CREATININE 0.57* 0.51* 0.54* 0.51*  GFRNONAA >60 >60 >60 >60  GFRAA >60 >60 >60 >60    LIVER FUNCTION TESTS:  Recent Labs  01/04/17 0219 01/06/17 0209 01/07/17 0248 01/09/17 0325  BILITOT 0.5 0.7 1.1 0.6  AST 53* 33 33 60*  ALT 34 22 22 32  ALKPHOS 109 67 62 66  PROT 9.2* 6.3* 6.2* 6.8  ALBUMIN  4.0 2.8* 2.8* 3.0*    TUMOR MARKERS: No results for input(s): AFPTM, CEA, CA199, CHROMGRNA in the last 8760 hours.  Assessment and Plan:  TBI as child Chronic vent/trach Admitted with aspiration Pulled out G tube-- Plan for replacement/exchange vs new placement Risks and Benefits discussed with the patient's brother via phone including, but not limited to the need for a barium enema during the procedure, bleeding, infection, peritonitis, or damage to adjacent structures. All of the patient's brothers questions were answered, he is agreeable to proceed. Consent signed and in chart.  Thank you for this interesting consult.  I greatly enjoyed meeting Dean Graham and look forward to participating in their care.  A copy of this report was sent to the requesting provider on this date.  Electronically Signed: Nilaya Graham A 01/10/2017, 10:30 AM   I spent a total of 40 Minutes    in face to face in clinical consultation, greater than 50% of which was counseling/coordinating care for percutaneous gastric tube placement/replacement/exchange

## 2017-01-10 NOTE — Progress Notes (Signed)
Unfortunately, we have an emergency case in IR that will be a multi hour procedure.  Mr. Delmastro' procedure will have to be delayed.  As a history, his procedure was delayed on Tuesday, the day it was ordered, as the patient became diaphoretic and unable to come down to radiology.  He was brought down to IR yesterday for his procedure, but unfortunately his tract had closed and therefore a scan was requested to determine if and how we could replace his g-tube.  Plan was to try and replace or do a new stick site today until this emergency case presented.  We will have the patient NPO p MN tonight and once again hold his heparin in the am and plan for procedure tomorrow.  Lynwood Kubisiak E 01/10/2017

## 2017-01-10 NOTE — Progress Notes (Signed)
Pharmacy Antibiotic Note  Dean Graham is a 56 y.o. male admitted on 01/04/2017 with sepsis.  Pharmacy has been consulted for Merrem dosing - broadened from Zosyn due to history of MDR Acinetobacter, Stenotrophomonas, and Pseudomonas.    The patient continues on Meropenem D#7. Renal function remains stable. Stop date in place for 5/4 after a 8d LOT.   Plan: 1. Merrem 1g IV q8h 2. Stop date already entered for 5/4 3. Pharmacy will sign off of consult as no dose adjustments are expected between now and the intended stop date.   Height: 5\' 10"  (177.8 cm) Weight: 140 lb (63.5 kg) IBW/kg (Calculated) : 73  Temp (24hrs), Avg:98.3 F (36.8 C), Min:96 F (35.6 C), Max:99.3 F (37.4 C)   Recent Labs Lab 01/04/17 0231 01/04/17 0534 01/04/17 0736  01/06/17 0209 01/07/17 0248 01/08/17 1340 01/09/17 0325 01/09/17 1846 01/10/17 0253  WBC  --   --   --   < > 9.4 7.2 8.8 9.1 9.0 8.7  CREATININE  --   --   --   < > 0.52* 0.57*  --  0.51* 0.54* 0.51*  LATICACIDVEN 3.31* 2.21* 1.7  --   --   --   --   --   --   --   < > = values in this interval not displayed.  Estimated Creatinine Clearance: 91.5 mL/min (A) (by C-G formula based on SCr of 0.51 mg/dL (L)).    Allergies  Allergen Reactions  . Ativan [Lorazepam] Other (See Comments)    unknown  . Azithromycin Other (See Comments)    Blisters all over body  . Ceftriaxone Other (See Comments)    Blisters all over body  . Vancomycin Other (See Comments)    Blisters all over body    Antimicrobials this admission:  Zyvox 4/27 >> 4/30 Merrem 4/27 >> [5/4] Zosyn 4/27 >>4/27  Dose adjustments this admission:  N/A  Microbiology results:  4/27 BCx: ngtd 4/27 UCx: multiple species 4/27 Sputum: mod serratia marcescens (R cefazolin) 4/27 MRSA PCR: positive  Thank you for allowing pharmacy to be a part of this patient's care.  Georgina Pillion, PharmD, BCPS Clinical Pharmacist Pager: [phone] Clinical phone for 01/10/2017 from  7a-3:30p: [phone] If after 3:30p, please call main pharmacy at: [phone] 01/10/2017 11:50 AM

## 2017-01-11 ENCOUNTER — Encounter (HOSPITAL_COMMUNITY): Payer: Self-pay | Admitting: Interventional Radiology

## 2017-01-11 ENCOUNTER — Inpatient Hospital Stay (HOSPITAL_COMMUNITY): Payer: Medicare Other

## 2017-01-11 HISTORY — PX: IR GASTROSTOMY TUBE MOD SED: IMG625

## 2017-01-11 LAB — BASIC METABOLIC PANEL
ANION GAP: 8 (ref 5–15)
BUN: <5 mg/dL — ABNORMAL LOW (ref 6–20)
CHLORIDE: 107 mmol/L (ref 101–111)
CO2: 21 mmol/L — ABNORMAL LOW (ref 22–32)
Calcium: 8.6 mg/dL — ABNORMAL LOW (ref 8.9–10.3)
Creatinine, Ser: 0.57 mg/dL — ABNORMAL LOW (ref 0.61–1.24)
GFR calc Af Amer: >60 mL/min (ref >60)
GLUCOSE: 85 mg/dL (ref 65–99)
POTASSIUM: 3.7 mmol/L (ref 3.5–5.1)
SODIUM: 136 mmol/L (ref 135–145)

## 2017-01-11 LAB — GLUCOSE, CAPILLARY
GLUCOSE-CAPILLARY: 83 mg/dL (ref 65–99)
GLUCOSE-CAPILLARY: 99 mg/dL (ref 65–99)
Glucose-Capillary: 94 mg/dL (ref 65–99)
Glucose-Capillary: 101 mg/dL — ABNORMAL HIGH (ref 65–99)

## 2017-01-11 LAB — CBC
HEMATOCRIT: 27.6 % — AB (ref 39.0–52.0)
HEMOGLOBIN: 8.9 g/dL — AB (ref 13.0–17.0)
MCH: 28 pg (ref 26.0–34.0)
MCHC: 32.2 g/dL (ref 30.0–36.0)
MCV: 86.8 fL (ref 78.0–100.0)
Platelets: 230 10*3/uL (ref 150–400)
RBC: 3.18 MIL/uL — ABNORMAL LOW (ref 4.22–5.81)
RDW: 16.6 % — ABNORMAL HIGH (ref 11.5–15.5)
WBC: 7.3 10*3/uL (ref 4.0–10.5)

## 2017-01-11 MED ORDER — FENTANYL CITRATE (PF) 100 MCG/2ML IJ SOLN
INTRAMUSCULAR | Status: AC
  Filled 2017-01-11: qty 2

## 2017-01-11 MED ORDER — IOPAMIDOL (ISOVUE-300) INJECTION 61%
INTRAVENOUS | Status: AC
  Administered 2017-01-11: 10 mL
  Filled 2017-01-11: qty 50

## 2017-01-11 MED ORDER — MIDAZOLAM HCL 2 MG/2ML IJ SOLN
INTRAMUSCULAR | Status: AC
  Filled 2017-01-11: qty 2

## 2017-01-11 MED ORDER — LIDOCAINE HCL 1 % IJ SOLN
INTRAMUSCULAR | Status: AC
  Filled 2017-01-11: qty 20

## 2017-01-11 MED ORDER — FENTANYL CITRATE (PF) 100 MCG/2ML IJ SOLN
INTRAMUSCULAR | Status: AC | PRN
  Administered 2017-01-11 (×4): 25 ug via INTRAVENOUS

## 2017-01-11 MED ORDER — LACOSAMIDE 10 MG/ML PO SOLN: 100.0000 mg | Freq: Two times a day (BID) | ORAL | 0 refills | Status: DC

## 2017-01-11 MED ORDER — LIDOCAINE HCL 1 % IJ SOLN
INTRAMUSCULAR | Status: AC | PRN
  Administered 2017-01-11: 10 mL

## 2017-01-11 MED ORDER — LEVETIRACETAM 100 MG/ML PO SOLN: 1000.0000 mg | Freq: Two times a day (BID) | ORAL | 0 refills | Status: DC

## 2017-01-11 MED ORDER — MIDAZOLAM HCL 2 MG/2ML IJ SOLN
INTRAMUSCULAR | Status: AC | PRN
  Administered 2017-01-11 (×2): 0.5 mg via INTRAVENOUS
  Administered 2017-01-11: 1 mg via INTRAVENOUS

## 2017-01-11 NOTE — Progress Notes (Signed)
Responded to vent alarm in the pts room to find the pt had decannulated himself. O2sats were in the 70's but improved to 80's with suctioning around the site.  RT called and responded immediately to the room.  New trach successfully inserted. See RT note.  Pt agitated following event with HR in the 140's. Gave 2mg  IV versed. Will continue to monitor.

## 2017-01-11 NOTE — Procedures (Signed)
Interventional Radiology Procedure Note  Procedure: Gastrostomy tube placement  Complications: None  Estimated Blood Loss: < 10 mL  Findings:  Via old gastrostomy tube exit site, sharp recanalization performed with 18 G trocar needle.  Stomach punctured after advancement of 5 Fr orogastric catheter via mouth into stomach. New 18 Fr balloon retention gastrostomy tube placed with tip in body of stomach.  OK to use.  Jodi Marble. Fredia Sorrow, M.D Pager:  [phone]

## 2017-01-11 NOTE — Progress Notes (Signed)
No wean this am due to pt chronic vent and going to IR for peg placement.

## 2017-01-11 NOTE — Progress Notes (Signed)
01/11/2017- Respiratory care note- Called to patient's room for self decannulation.  #8 shiley cuffed trach from bedside supplies placed without difficulty.  End-tidal co2 detector noted to have good color change post placement of trach.  Also noted good volume return and bilateral breath sounds.  No complications noted with patient stable post placement of trach tube.

## 2017-01-11 NOTE — Progress Notes (Signed)
PULMONARY / CRITICAL CARE MEDICINE   Name: Dona Klemann MRN: [id number] DOB: 11-17-1960    ADMISSION DATE:  01/04/2017 CONSULTATION DATE:  01/04/2017  REFERRING MD:  Dr. 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 He was ventilator dependent after his initial TBI for few years then he did not require vent support until recently 2-3 years ago when he needed to be intubated, has been vent dependent since (able to tolerate TC at time recently).   Presents from [facility] on 4/27 after being found unresponsive and apneic with brown/tan secretions through trach site, patient bagged for a couple of minutes and placed on vent. Upon arrival to ED patient was a GCS of 3, baseline mental status is involuntary movements in the upper extremities and ability to answer yes/no questions. CT Head Negative. CXR with bilateral peribronchial opacities. WBC 19.1, Lactic Acid 3.31. Hemodynamically stable. PCCM to admit.   Of note patient was admitted 9/19-10/9/17 with Pseudomonas PNA > ID involvement for multiple drug resistance.   SUBJECTIVE:  On the vent.  No recent issues.   VITAL SIGNS: BP 115/86   Pulse 87   Temp 97.9 F (36.6 C) (Oral)   Resp 20   Ht 5\' 10"  (1.778 m)   Wt 63.5 kg (140 lb)   SpO2 100%   BMI 20.09 kg/m   HEMODYNAMICS:    VENTILATOR SETTINGS: Vent Mode: PRVC FiO2 (%):  [40 %] 40 % Set Rate:  [20 bmp] 20 bmp Vt Set:  [500 mL] 500 mL PEEP:  [5 cmH20] 5 cmH20 Pressure Support:  [8 cmH20] 8 cmH20 Plateau Pressure:  [11 cmH20-18 cmH20] 14 cmH20  INTAKE / OUTPUT: I/O last 3 completed shifts: In: 3775 [I.V.:2940; IV Piggyback:835] Out: 1400 [Urine:1400]  General appearance:  56 Year old  Male, NAD Eyes: anicteric sclerae, moist conjunctivae; PERRL,  EOMI bilaterally. Mouth:  membranes and no mucosal ulcerations; normal hard and soft palate Neck: Trachea midline; neck supple, no JVD, trach unremarkable Lungs/chest: scattered rhonchi, with normal respiratory effort and no intercostal retractions CV: RRR, no MRGs  Abdomen: Soft, non-tender; no masses or HSM Extremities: No peripheral edema or extremity lymphadenopathy Skin: Normal temperature, turgor and texture; no rash, ulcers or subcutaneous nodules Neuro/Psych: eyes open, does not f/c/. contracted LABS:  BMET  Recent Labs Lab 01/09/17 1846 01/10/17 0253 01/11/17 0244  NA 140 139 136  K 3.9 3.9 3.7  CL 108 107 107  CO2 23 23 21*  BUN <5* <5* <5*  CREATININE 0.54* 0.51* 0.57*  GLUCOSE 90 90 85    Electrolytes  Recent Labs Lab 01/05/17 0841  01/09/17 1846 01/10/17 0253 01/11/17 0244  CALCIUM 9.1  < > 8.9 8.8* 8.6*  MG 1.7  --   --   --   --   PHOS 1.5*  --   --   --   --   < > = values in this interval not displayed.  CBC  Recent Labs Lab 01/09/17 1846 01/10/17 0253 01/11/17 0244  WBC 9.0 8.7 7.3  HGB 9.4* 9.0* 8.9*  HCT 28.7* 28.1* 27.6*  PLT 284 281 230    Coag's  Recent Labs Lab 01/09/17 1846 01/10/17 0253  APTT 37* 37*  INR 1.24 1.29    Sepsis Markers  Recent Labs Lab  01/05/17 0841 01/06/17 0209  PROCALCITON 2.17 1.43    ABG No results for input(s): PHART, PCO2ART, PO2ART in the last 168 hours.  Liver Enzymes  Recent Labs Lab 01/06/17 0209 01/07/17 0248 01/09/17 0325  AST 33 33 60*  ALT 22 22 32  ALKPHOS 67 62 66  BILITOT 0.7 1.1 0.6  ALBUMIN 2.8* 2.8* 3.0*    Cardiac Enzymes  Recent Labs Lab 01/04/17 1414 01/04/17 1932  TROPONINI 0.07* 0.06*    Glucose  Recent Labs Lab 01/10/17 2003 01/10/17 2316 01/11/17 0334 01/11/17 0748 01/11/17 0823 01/11/17 1213  GLUCAP 90 84 94 99 83 101*    Imaging  No results found. STUDIES:  CXR 4/27 > Bilateral peribronchial opacities, likely mild pulmonary edema, no  large consolidation  CT Head 4/27> no acute ICH, age-related atrophy, bifrontal old infarcts and encephalomalacia, right there are left, right frontoparietal craniotomy and shunt catheter along the right sylvian fissure   CULTURES: Blood 4/27 > (-) Sputum 4/27>serratia M MRSA 4/27 > (-) Legion Urine 4/27 >> neg Strep Urine 4/27>>neg  ANTIBIOTICS: Allergic to azithromycin, ceftx, vanc, and ativan Meropenem 4/27 > 5/3 Zosyn 4/27>>off Linezolid 4/27>>off  SIGNIFICANT EVENTS: 4/27 > Presents to ED   LINES/TUBES: Trach (Chronic) PEG (Chronic) Foley (Chronic)  DISCUSSION:    ASSESSMENT / PLAN:  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  Pulm problem list  Trach/vent dependence d/t chronic encephalopathy, quadriplegia & ineffective cough Plan Cont full vent support. NO weaning for now.   Aspiration PNA vs HCAP +Serratia marcescens  Plan To complete 8d of merrem for HCAP. He finished Meropenem 5/3.   Discussion  Plan to go back to [facility] today.     Pollie Meyer, MD 01/11/2017, 2:07 PM [facility] Pager [phone] After 3 pm or if no answer, call [phone]

## 2017-01-11 NOTE — Discharge Summary (Signed)
Physician Discharge Summary  Dean Graham WUJ:[id number] DOB: 05-Oct-1960 DOA: 01/04/2017  PCP: Hillary Bow, MD  Admit date: 01/04/2017 Discharge date: 01/11/2017  Admitted From: SNF Disposition:  [facility]  Recommendations for Outpatient Follow-up:  1. Follow up with PCP in 1-2 weeks 2. Keppra Dose Increased to 1000mg  twice daily  3. Cont Vimpat as prescribed  4. Monitor closely as patient has tendency to pull out sis trach/PEG tube.  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 At first he was started on broad-spectrum antibiotics and infectious disease was consulted who suggested stopping Zyvox and continue in [employer] for 8 days. He completed his antibiotic course and his breathing status stabilized. During his stay initially he pulled out his tracheostomy on 4/27 which was replaced by pulmonary team. And then he pulled out his PEG tube on 01/06/2017. Intermittently there were a couple of times where it was thought he may have been having seizure versus agitation therefore his doses of Keppra was increased. Finally PEG tube was placed on 01/12/2017 once he was stabilized and after obtaining repeat CT of the abdomen pelvis to ensure no other acute pathology was going on. Pulled out his trach earlier today morning but RT replaced it without any problems.  At this time he is deemed medically stable and has reached maximum benefit from in hospital  stay and is ready to go to [facility] skilled nursing facility for further care.  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   Fever   Leukocytosis   Acute hypoxemic respiratory failure (HCC)  Chronic Ventilator Dependent Respiratory Failure s/p Tracheostomy +/-Aspiration - stable Ongoing care of vent and trach per PCCM - pt removed his own trach on 4/27; it was replaced by PCCM w/ a #8 cuffed shiley - he appears stable from the resp standpoint   H/O MDR Stenotrophomonas maltophilia, MDR Pseudomonas aeruginosa - Serratia in trach aspirate - stable  Antibiotics directed by ID service - no fever and WBC has normalized -  -completed Merrem today 5/3  Chronic Hypotension  Continue home midodrine - BP normal at this time   Chronic Diastolic Heart Failure  EF 65-70% via TTE Sept 2017 - dry weight appears to be approximately 72 kg - well compensated at this time       [employer]   01/07/17 0500 01/08/17 0712 01/09/17 0500  Weight: 63.5 kg (139 lb 15.9 oz) 63.5 kg (139 lb 15.9 oz) 63.5 kg (140 lb)    Chronic A.fib Continue amiodarone - normal sinus rhythm today except for episode of sinus tachy during possible seizure activity   Neurogenic Bladder  foley in place    Dysphagia s/p PEG pulled out his own PEG 4/29 - unfortunately action was not taken immediately, and his fistula tract was not able to be intubated blindly -peg placed by IR on 5/4  Anemia of Chronic Disease  Drop in hemoglobin since admission likely related to volume resuscitation -  no evidence of acute blood loss - has stabilized   DM2 Well controlled/stable   Chronic Post Traumatic Encephalopathy +/- Metabolic Encephalopathy  Appears to be at his baseline mental status  Quadriplegia, Traumatic Brain Injury as a child  Seizure  Some seizure activity noted 4/28 and  possibly today, though this is difficult to distinguish from agitation - Keppra has been increased during this hospital stay - continue Vimpat - when necessary Versed - appears stable at present   Uncontrolled Agitation- can use low dose haldol if needed. Avoid over sedation.  MRSA screen +    Discharge Instructions   Allergies as of 01/11/2017      Reactions   Ativan [lorazepam] Other (See Comments)   unknown   Azithromycin Other (See Comments)   Blisters all over body   Ceftriaxone Other (See Comments)   Blisters all over body   Vancomycin Other (See Comments)   Blisters all over body      Medication List    STOP taking these medications   loperamide 1 MG/5ML solution Commonly known as:  IMODIUM     TAKE these medications   acetaminophen 325 MG tablet Commonly known as:  TYLENOL Place 2 tablets (650 mg total) into feeding tube every 6 (six) hours as needed for mild pain, moderate pain or fever.   albuterol (2.5 MG/3ML) 0.083% nebulizer solution Commonly known as:  PROVENTIL Take 2.5 mg by nebulization every 6 (six) hours as needed for wheezing or shortness of breath.   amiodarone 100 MG tablet Commonly known as:  PACERONE Place 100 mg into feeding tube daily.   chlorhexidine 0.12 % solution Commonly known as:  PERIDEX Use as directed 15 mLs in the mouth or throat 2 (two) times daily.   famotidine 20 MG tablet Commonly known as:  PEPCID Place 1 tablet (20 mg total) into feeding tube 2 (two) times daily. What changed:  when to take this   feeding supplement (VITAL AF 1.2 CAL) Liqd Place 1,000 mLs into feeding tube continuous. 1,000 mL, Per Tube, at 70 mL/hr, Continuous   free water Soln Place 200 mLs into feeding tube every 8 (eight) hours.   furosemide 40 MG tablet Commonly known as:  LASIX Place 40 mg into feeding tube daily.   Lacosamide 100 MG Tabs Commonly known as:  VIMPAT Place 1 tablet (100 mg total) into feeding tube 2 (two) times daily. What  changed:  Another medication with the same name was added. Make sure you understand how and when to take each.   lacosamide 10 MG/ML oral solution Commonly known as:  VIMPAT Take 10 mLs (100 mg total) by mouth 2 (two) times daily. What changed:  You were already taking a medication with the same name, and this prescription was added. Make sure you understand how and when to take each.   levETIRAcetam 100 MG/ML solution Commonly known as:  KEPPRA Take 10 mLs (1,000 mg total) by mouth 2 (two) times daily. What changed:  how much to take  how to take this   lidocaine 2 % solution Commonly known as:  XYLOCAINE Use as directed 20 mLs in the mouth or throat as needed for mouth pain.   midodrine 10 MG tablet Commonly known as:  PROAMATINE Place 10 mg into feeding tube every 8 (eight) hours.   multivitamin with minerals Tabs tablet Place 1 tablet into feeding tube daily.   OCUSOFT LID SCRUB Pads Apply 1 each topically 2 (two) times daily.   phenazopyridine 200  MG tablet Commonly known as:  PYRIDIUM Take 200 mg by mouth 2 (two) times daily.   saccharomyces boulardii 250 MG capsule Commonly known as:  FLORASTOR Take 1 capsule (250 mg total) by mouth 2 (two) times daily. Via Tube.   traMADol 50 MG tablet Commonly known as:  ULTRAM Take 50 mg by mouth 2 (two) times daily.   vitamin C 500 MG tablet Commonly known as:  ASCORBIC ACID Place 1 tablet (500 mg total) into feeding tube daily. What changed:  when to take this       Allergies  Allergen Reactions  . Ativan [Lorazepam] Other (See Comments)    unknown  . Azithromycin Other (See Comments)    Blisters all over body  . Ceftriaxone Other (See Comments)    Blisters all over body  . Vancomycin Other (See Comments)    Blisters all over body    Consultations:  ID  Pulmonary    Procedures/Studies: Ct Abdomen Wo Contrast  Result Date: 01/10/2017 CLINICAL DATA:  56 year old male with a history of dysphagia. EXAM:  CT ABDOMEN WITHOUT CONTRAST TECHNIQUE: Multidetector CT imaging of the abdomen was performed following the standard protocol without IV contrast. COMPARISON:  No prior CT FINDINGS: Lower chest: Bilateral pleural effusions with associated atelectatic lung. Respiratory motion somewhat limits evaluation. Punctate hyperdensities of the visualize right lung in a peribronchovascular distribution, potentially from priors episodes of aspiration. Small amount of debris within the central airway is. Left not well visualized. No significant pericardial fluid/ thickening. Hepatobiliary: Rounded low-density lesion within the right liver measures 12 mm (image 26 of series 4). Unremarkable gallbladder. Pancreas: Unremarkable pancreas Spleen: Unremarkable spleen Adrenals/Urinary Tract: Unremarkable bilateral adrenal glands. Unremarkable bilateral kidneys. Stomach/Bowel: Decompressed stomach lumen. Transverse colon extends to the left subdiaphragmatic region, interposed between stomach, liver, an the anterior abdominal wall. Below the transverse colon, there is evidence of prior percutaneous gastrostomy placement and gastropexy, with 1 and perhaps 2 sites of percutaneous access through the abdominal wall. No radiopaque foreign body. Vascular/Lymphatic: No significant vascular calcifications. No adenopathy. Other: No abdominal wall hernia. Musculoskeletal: No displaced fracture. No significant degenerative changes. IMPRESSION: Bilateral pleural effusions with associated atelectasis. Small hyperdense foci of the visualized right lung in a peribronchovascular distribution, potentially from prior episodes of aspiration. CT demonstrates evidence of prior percutaneous gastrostomy tube placement, with at least 1 and perhaps 2 sites of gastropexy. Note that the transverse colon completely overlies the stomach, interposed between the anterior stomach/left liver lobe, and the anterior abdominal wall. Electronically Signed   By: Gilmer Mor  D.O.   On: 01/10/2017 08:37   Ct Head Wo Contrast  Result Date: 01/04/2017 CLINICAL DATA:  56 year old male with altered mental status. EXAM: CT HEAD WITHOUT CONTRAST TECHNIQUE: Contiguous axial images were obtained from the base of the skull through the vertex without intravenous contrast. COMPARISON:  None. FINDINGS: Brain: There is no acute intracranial hemorrhage. Areas of low attenuation and encephalomalacia noted in the frontal lobes bilaterally and right greater left likely related to old infarcts. Right posterior parietal approach catheter along the sylvian fissure noted. There is mild generalized brain atrophy. There is mild dilatation of the ventricular system which may be related to volume loss or represent NPH. Direct comparison with prior images, if available, is recommended to evaluate for interval change. There is atrophy of the cerebellum. Vascular: No hyperdense vessel or unexpected calcification. Skull: Right frontoparietal craniotomy. No acute calvarial fracture. Sinuses/Orbits: Mucoperiosteal thickening of paranasal sinuses. No air-fluid levels. There is opacification of  the mastoid air cells bilaterally, left greater right. Other: None IMPRESSION: 1. No acute intracranial hemorrhage. 2. Age-related atrophy. Mild dilatation of the ventricular system may be related to volume loss or represent NPH. Correlation with clinical exam recommended. Direct comparison with prior images, if available, recommended to evaluate for interval change. 3. 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 4. Right frontoparietal craniotomy and shunt catheter along the right sylvian fissure. Electronically Signed   By: Elgie Collard M.D.   On: 01/04/2017 04:37   Dg Chest Port 1 View  Result Date: 01/05/2017 CLINICAL DATA:  Ventilator dependent. EXAM: PORTABLE CHEST 1 VIEW COMPARISON:  01/04/2017. FINDINGS: Cardiac enlargement. Tracheostomy apparent good position. Marked worsening aeration  with decreased lung volumes, and bibasilar opacities representing atelectasis, possible infiltrate, and effusions. No pneumothorax. IMPRESSION: Marked worsening aeration. BILATERAL lung opacities have progressed since yesterday's radiograph; concern raised for developing pneumonia. Electronically Signed   By: Elsie Stain M.D.   On: 01/05/2017 07:46   Dg Chest Port 1 View  Result Date: 01/04/2017 CLINICAL DATA:  Respiratory failure EXAM: PORTABLE CHEST 1 VIEW COMPARISON:  Chest radiograph 06/12/2016 FINDINGS: The tip of the tracheostomy tube is just below the level of the clavicles. Cardiomediastinal contours are normal. Sequela of remote barium aspiration are again noted in the right lung. There are bilateral peribronchial opacities, likely mild pulmonary edema. There is no lobar consolidation. IMPRESSION: Tracheostomy tube tip just below the level of the clavicles and 5 cm above the inferior margin of the carina. Bilateral peribronchial opacities, likely mild pulmonary edema. No large area of consolidation. Electronically Signed   By: Deatra Robinson M.D.   On: 01/04/2017 03:06      Subjective:   Discharge Exam: Vitals:   01/11/17 1026 01/11/17 1200  BP: 115/86   Pulse: 87   Resp: 20   Temp:  97.9 F (36.6 C)   Vitals:   01/11/17 1015 01/11/17 1020 01/11/17 1026 01/11/17 1200  BP: (!) 126/92 115/88 115/86   Pulse: 90 91 87   Resp: 20 20 20    Temp:    97.9 F (36.6 C)  TempSrc:    Oral  SpO2: 100% 100% 100%   Weight:      Height:        General: No acute respiratory distress despite agitation/?seizure Lungs: Clear to auscultation bilaterally - no wheezing  Cardiovascular: NSR, no R/M/G Abdomen: Nondistended, soft, bowel sounds positive, no rebound - PEG insertion site looks ok Extremities: No significant edema B LE    The results of significant diagnostics from this hospitalization (including imaging, microbiology, ancillary and laboratory) are listed below for reference.      Microbiology: Recent Results (from the past 240 hour(s))  Blood Culture (routine x 2)     Status: None   Collection Time: 01/04/17  2:19 AM  Result Value Ref Range Status   Specimen Description BLOOD LEFT ARM  Final   Special Requests AEROBIC BOTTLE ONLY Blood Culture adequate volume  Final   Culture NO GROWTH 5 DAYS  Final   Report Status 01/09/2017 FINAL  Final  Blood Culture (routine x 2)     Status: None   Collection Time: 01/04/17  2:35 AM  Result Value Ref Range Status   Specimen Description BLOOD LEFT HAND  Final   Special Requests AEROBIC BOTTLE ONLY Blood Culture adequate volume  Final   Culture NO GROWTH 5 DAYS  Final   Report Status 01/09/2017 FINAL  Final  Culture, respiratory (NON-Expectorated)  Status: None   Collection Time: 01/04/17  6:05 AM  Result Value Ref Range Status   Specimen Description TRACHEAL ASPIRATE  Final   Special Requests NONE  Final   Gram Stain   Final    ABUNDANT WBC PRESENT,BOTH PMN AND MONONUCLEAR FEW GRAM NEGATIVE RODS    Culture MODERATE SERRATIA MARCESCENS  Final   Report Status 01/07/2017 FINAL  Final   Organism ID, Bacteria SERRATIA MARCESCENS  Final      Susceptibility   Serratia marcescens - MIC*    CEFAZOLIN >=64 RESISTANT Resistant     CEFEPIME <=1 SENSITIVE Sensitive     CEFTAZIDIME <=1 SENSITIVE Sensitive     CEFTRIAXONE <=1 SENSITIVE Sensitive     CIPROFLOXACIN <=0.25 SENSITIVE Sensitive     GENTAMICIN <=1 SENSITIVE Sensitive     TRIMETH/SULFA <=20 SENSITIVE Sensitive     * MODERATE SERRATIA MARCESCENS  MRSA PCR Screening     Status: Abnormal   Collection Time: 01/04/17  6:14 AM  Result Value Ref Range Status   MRSA by PCR POSITIVE (A) NEGATIVE Final    Comment:        The GeneXpert MRSA Assay (FDA approved for NASAL specimens only), is one component of a comprehensive MRSA colonization surveillance program. It is not intended to diagnose MRSA infection nor to guide or monitor treatment for MRSA  infections. RESULT CALLED TO, READ BACK BY AND VERIFIED WITH: Mirian Capuchin RN 9:35 01/04/17 ([email or web address])   Culture, Urine     Status: Abnormal   Collection Time: 01/04/17  3:10 PM  Result Value Ref Range Status   Specimen Description URINE, RANDOM  Final   Special Requests NONE  Final   Culture MULTIPLE SPECIES PRESENT, SUGGEST RECOLLECTION (A)  Final   Report Status 01/05/2017 FINAL  Final     Labs: BNP (last 3 results)  Recent Labs  01/04/17 0219  BNP 63.7   Basic Metabolic Panel:  Recent Labs Lab 01/05/17 0841  01/07/17 0248 01/09/17 0325 01/09/17 1846 01/10/17 0253 01/11/17 0244  NA 139  < > 143 140 140 139 136  K 4.1  < > 4.1 4.1 3.9 3.9 3.7  CL 105  < > 114* 109 108 107 107  CO2 25  < > 23 22 23 23  21*  GLUCOSE 112*  < > 89 84 90 90 85  BUN 17  < > 12 <5* <5* <5* <5*  CREATININE 0.59*  < > 0.57* 0.51* 0.54* 0.51* 0.57*  CALCIUM 9.1  < > 8.7* 9.0 8.9 8.8* 8.6*  MG 1.7  --   --   --   --   --   --   PHOS 1.5*  --   --   --   --   --   --   < > = values in this interval not displayed. Liver Function Tests:  Recent Labs Lab 01/06/17 0209 01/07/17 0248 01/09/17 0325  AST 33 33 60*  ALT 22 22 32  ALKPHOS 67 62 66  BILITOT 0.7 1.1 0.6  PROT 6.3* 6.2* 6.8  ALBUMIN 2.8* 2.8* 3.0*   No results for input(s): LIPASE, AMYLASE in the last 168 hours. No results for input(s): AMMONIA in the last 168 hours. CBC:  Recent Labs Lab 01/08/17 1340 01/09/17 0325 01/09/17 1846 01/10/17 0253 01/11/17 0244  WBC 8.8 9.1 9.0 8.7 7.3  HGB 9.4* 9.8* 9.4* 9.0* 8.9*  HCT 29.7* 30.0* 28.7* 28.1* 27.6*  MCV 87.9 87.7 86.7 86.2 86.8  PLT 281 268  284 281 230   Cardiac Enzymes:  Recent Labs Lab 01/04/17 1414 01/04/17 1932  TROPONINI 0.07* 0.06*   BNP: Invalid input(s): POCBNP CBG:  Recent Labs Lab 01/10/17 2316 01/11/17 0334 01/11/17 0748 01/11/17 0823 01/11/17 1213  GLUCAP 84 94 99 83 101*   D-Dimer No results for input(s): DDIMER in the last 72 hours. Hgb  A1c No results for input(s): HGBA1C in the last 72 hours. Lipid Profile No results for input(s): CHOL, HDL, LDLCALC, TRIG, CHOLHDL, LDLDIRECT in the last 72 hours. Thyroid function studies No results for input(s): TSH, T4TOTAL, T3FREE, THYROIDAB in the last 72 hours.  Invalid input(s): FREET3 Anemia work up No results for input(s): VITAMINB12, FOLATE, FERRITIN, TIBC, IRON, RETICCTPCT in the last 72 hours. Urinalysis    Component Value Date/Time   COLORURINE AMBER (A) 01/04/2017 0228   APPEARANCEUR CLOUDY (A) 01/04/2017 0228   LABSPEC 1.017 01/04/2017 0228   PHURINE 9.0 (H) 01/04/2017 0228   GLUCOSEU NEGATIVE 01/04/2017 0228   HGBUR NEGATIVE 01/04/2017 0228   BILIRUBINUR NEGATIVE 01/04/2017 0228   KETONESUR NEGATIVE 01/04/2017 0228   PROTEINUR NEGATIVE 01/04/2017 0228   NITRITE POSITIVE (A) 01/04/2017 0228   LEUKOCYTESUR MODERATE (A) 01/04/2017 0228   Sepsis Labs Invalid input(s): PROCALCITONIN,  WBC,  LACTICIDVEN Microbiology Recent Results (from the past 240 hour(s))  Blood Culture (routine x 2)     Status: None   Collection Time: 01/04/17  2:19 AM  Result Value Ref Range Status   Specimen Description BLOOD LEFT ARM  Final   Special Requests AEROBIC BOTTLE ONLY Blood Culture adequate volume  Final   Culture NO GROWTH 5 DAYS  Final   Report Status 01/09/2017 FINAL  Final  Blood Culture (routine x 2)     Status: None   Collection Time: 01/04/17  2:35 AM  Result Value Ref Range Status   Specimen Description BLOOD LEFT HAND  Final   Special Requests AEROBIC BOTTLE ONLY Blood Culture adequate volume  Final   Culture NO GROWTH 5 DAYS  Final   Report Status 01/09/2017 FINAL  Final  Culture, respiratory (NON-Expectorated)     Status: None   Collection Time: 01/04/17  6:05 AM  Result Value Ref Range Status   Specimen Description TRACHEAL ASPIRATE  Final   Special Requests NONE  Final   Gram Stain   Final    ABUNDANT WBC PRESENT,BOTH PMN AND MONONUCLEAR FEW GRAM NEGATIVE  RODS    Culture MODERATE SERRATIA MARCESCENS  Final   Report Status 01/07/2017 FINAL  Final   Organism ID, Bacteria SERRATIA MARCESCENS  Final      Susceptibility   Serratia marcescens - MIC*    CEFAZOLIN >=64 RESISTANT Resistant     CEFEPIME <=1 SENSITIVE Sensitive     CEFTAZIDIME <=1 SENSITIVE Sensitive     CEFTRIAXONE <=1 SENSITIVE Sensitive     CIPROFLOXACIN <=0.25 SENSITIVE Sensitive     GENTAMICIN <=1 SENSITIVE Sensitive     TRIMETH/SULFA <=20 SENSITIVE Sensitive     * MODERATE SERRATIA MARCESCENS  MRSA PCR Screening     Status: Abnormal   Collection Time: 01/04/17  6:14 AM  Result Value Ref Range Status   MRSA by PCR POSITIVE (A) NEGATIVE Final    Comment:        The GeneXpert MRSA Assay (FDA approved for NASAL specimens only), is one component of a comprehensive MRSA colonization surveillance program. It is not intended to diagnose MRSA infection nor to guide or monitor treatment for MRSA infections. RESULT CALLED TO,  READ BACK BY AND VERIFIED WITH: Mirian Capuchin RN 9:35 01/04/17 ([email or web address])   Culture, Urine     Status: Abnormal   Collection Time: 01/04/17  3:10 PM  Result Value Ref Range Status   Specimen Description URINE, RANDOM  Final   Special Requests NONE  Final   Culture MULTIPLE SPECIES PRESENT, SUGGEST RECOLLECTION (A)  Final   Report Status 01/05/2017 FINAL  Final     Time coordinating discharge: Over 30 minutes  SIGNED:   Dimple Nanas, MD  [facility] 01/11/2017, 1:03 PM Pager   If 7PM-7AM, please contact night-coverage [email or web address] Password [email or web address]

## 2017-01-11 NOTE — Progress Notes (Signed)
Nutrition Follow Up  DOCUMENTATION CODES:   Not applicable  INTERVENTION:    As medically appropriate, re-start Vital AF 1.2 at 20 ml/hr and increase by 10 ml every 4 hours to goal rate of 70 ml/hr  TF regimen to provide 2016 kcals, 126 gm protein, 1362 ml of free water  NUTRITION DIAGNOSIS:   Inadequate oral intake related to inability to eat as evidenced by NPO status, ongoing  GOAL:   Patient will meet greater than or equal to 90% of their needs, met  MONITOR:   Vent status, TF tolerance, Labs, Weight trends, Skin, I & O's  ASSESSMENT:   56 yo quadriplegic male s/p 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 He was ventilator dependent after his initial TBI for few years then he did not require vent support until recently 2-3 years ago when he needed to be intubated, has been vent dependent since (able to tolerate TC at time recently).   Patient is currently on ventilator support MV: 9.3 L/min Temp (24hrs), Avg:98 F (36.7 C), Min:97.7 F (36.5 C), Max:98.6 F (37 C)  Pt admitted from [facility] after being found unresponsive and apneic. Eventually became more responsive and pulled out his trach.  Thick tan secretions were seen at that time.  Pt currently in [place]. New 18 Fr gastrostomy tube placed with tip in body of stomach. Medications and labs reviewed. CBG's 99-83-101.  Nutrition Focused Physical Exam not applicable given spinal cord injury.   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  Estimated Nutritional Needs:   Kcal:  1900  Protein:  120-130 gm  Fluid:  per MD  EDUCATION NEEDS:   No education needs identified at this time  Arthur Holms, RD, LDN Pager #: [phone] After-Hours Pager #: [phone]

## 2017-02-07 ENCOUNTER — Encounter (HOSPITAL_COMMUNITY): Payer: Self-pay | Admitting: *Deleted

## 2017-02-07 ENCOUNTER — Emergency Department (HOSPITAL_COMMUNITY)
Admission: EM | Admit: 2017-02-07 | Discharge: 2017-02-08 | Disposition: A | Discharge status: Skilled Nursing Facility | Payer: Medicare Other | Attending: Emergency Medicine | Admitting: Emergency Medicine

## 2017-02-07 ENCOUNTER — Emergency Department (HOSPITAL_COMMUNITY): Payer: Medicare Other

## 2017-02-07 DIAGNOSIS — Z9911 Dependence on respirator [ventilator] status: Secondary | ICD-10-CM | POA: Insufficient documentation

## 2017-02-07 DIAGNOSIS — E119 Type 2 diabetes mellitus without complications: Secondary | ICD-10-CM | POA: Insufficient documentation

## 2017-02-07 DIAGNOSIS — I5032 Chronic diastolic (congestive) heart failure: Secondary | ICD-10-CM | POA: Diagnosis not present

## 2017-02-07 DIAGNOSIS — Z79899 Other long term (current) drug therapy: Secondary | ICD-10-CM | POA: Insufficient documentation

## 2017-02-07 DIAGNOSIS — G8250 Quadriplegia, unspecified: Secondary | ICD-10-CM | POA: Insufficient documentation

## 2017-02-07 DIAGNOSIS — I10 Essential (primary) hypertension: Secondary | ICD-10-CM | POA: Diagnosis present

## 2017-02-07 DIAGNOSIS — E870 Hyperosmolality and hypernatremia: Secondary | ICD-10-CM | POA: Insufficient documentation

## 2017-02-07 LAB — COMPREHENSIVE METABOLIC PANEL
ALK PHOS: 101 U/L (ref 38–126)
ALT: 45 U/L (ref 17–63)
AST: 36 U/L (ref 15–41)
Albumin: 2.4 g/dL — ABNORMAL LOW (ref 3.5–5.0)
Anion gap: 5 (ref 5–15)
BILIRUBIN TOTAL: 0.5 mg/dL (ref 0.3–1.2)
BUN: 31 mg/dL — AB (ref 6–20)
CO2: 29 mmol/L (ref 22–32)
CREATININE: 0.98 mg/dL (ref 0.61–1.24)
Calcium: 8.5 mg/dL — ABNORMAL LOW (ref 8.9–10.3)
Chloride: 113 mmol/L — ABNORMAL HIGH (ref 101–111)
GFR calc Af Amer: >60 mL/min (ref >60)
Glucose, Bld: 84 mg/dL (ref 65–99)
POTASSIUM: 4.1 mmol/L (ref 3.5–5.1)
Sodium: 147 mmol/L — ABNORMAL HIGH (ref 135–145)
TOTAL PROTEIN: 6.5 g/dL (ref 6.5–8.1)

## 2017-02-07 LAB — URINALYSIS, ROUTINE W REFLEX MICROSCOPIC
Bilirubin Urine: NEGATIVE
Glucose, UA: NEGATIVE mg/dL
KETONES UR: NEGATIVE mg/dL
Nitrite: POSITIVE — AB
Specific Gravity, Urine: 1.017 (ref 1.005–1.030)
TRANS EPITHEL UA: 1
pH: 7 (ref 5.0–8.0)

## 2017-02-07 LAB — I-STAT CG4 LACTIC ACID, ED: Lactic Acid, Venous: 1.75 mmol/L (ref 0.5–1.9)

## 2017-02-07 MED ORDER — SODIUM CHLORIDE 0.9 % IV BOLUS (SEPSIS)
1000.0000 mL | Freq: Once | INTRAVENOUS | Status: AC
  Administered 2017-02-07: 1000 mL via INTRAVENOUS

## 2017-02-07 NOTE — ED Triage Notes (Signed)
Pt arrives from [facility] via [employer]. Pt family states the pt needs a PICC line placed, but requests our facility for placement due to recurrent infections at [facility] (per family). Family told [facility] the pt has been having HTN and hypernatremia for a "few days". Pt received his feeding via PEG tube PTA.

## 2017-02-07 NOTE — ED Provider Notes (Signed)
[facility]EMERGENCY DEPT Provider Note   CSN: [id number] Arrival date & time: 02/07/17  1555     History   Chief Complaint Chief Complaint  Patient presents with  . Hypertension    HPI Dean Graham is a 56 y.o. male.  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   Speaking with the facility patient over the weekend was noticed to have mild hypernatremia and the 150s. Also because of x-ray findings there was concern for a new pneumonia and he was started on Cipro 4 days ago. Nurse at [facility] states that patient has not had increased secretions from his trach and as far she knows he has not been febrile. However today when they did repeat labs patient was more hyper natriuretic to 165. Nephrology wanted to start him on IV fluids however they were unable to get an IV to start IV fluids. They discussed with his family about having a PICC line placed. Family was adamant that they did not want PICC line placed at [facility] because he has had line sepsis in the past and they wanted him sent to the hospital. They stated they did not want him to return to [facility] until his symptoms were improved.   The history is provided by the EMS personnel and medical records.    Past Medical History:  Diagnosis Date  . Atrial fibrillation (HCC)   . CHF (congestive heart failure) (HCC)   . Chronic respiratory failure (HCC)   . Diabetes mellitus without complication (HCC)   . Diastolic heart failure (HCC)   . Dysphagia   . GERD (gastroesophageal reflux disease)   . Quadriplegia (HCC)   . Seizures [facility])     Patient Active Problem List   Diagnosis Date Noted  . Acute hypoxemic respiratory failure  (HCC)   . Ventilator dependent (HCC)   . 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 Fever   . Leukocytosis   . Aspiration pneumonia (HCC) 01/04/2017  . Anasarca   . Bleeding   . Tracheostomy care (HCC)   . Hypoalbuminemia   . Cough with frothy sputum   . VAP (ventilator-associated pneumonia) (HCC)   . Carbapenem-resistant Acinetobacter baumannii infection   . History of infection by MDR Stenotrophomonas maltophilia   . History of MDR Pseudomonas aeruginosa infection   . Chronic post traumatic encephalopathy   . Acute encephalopathy   . Hypokalemia   . Hypomagnesemia   . Urinary tract infection, site not specified   . Acute on chronic respiratory failure with hypoxia (HCC)   . Dysphagia   . Decubitus ulcer of sacral area   . Encounter for wound care   . Protein-calorie malnutrition, severe (HCC)   . Palliative care encounter   . Goals of care, counseling/discussion   . Pressure injury of skin 05/31/2016  . Quadriplegia (HCC) 05/31/2016  . Atrial fibrillation (HCC) 05/31/2016  . Diabetes mellitus without complication (HCC) 05/31/2016  . Chronic respiratory failure (HCC) 05/31/2016  . 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 Septic shock (HCC) 05/29/2016  . HCAP (healthcare-associated pneumonia)     Past Surgical History:  Procedure Laterality Date  . IR GASTROSTOMY TUBE MOD SED  01/11/2017       Home Medications    Prior  to Admission medications   Medication Sig Start Date End Date Taking? Authorizing Provider  acetaminophen (TYLENOL) 325 MG tablet Place 2 tablets (650 mg total) into feeding tube every 6 (six) hours as needed for mild pain, moderate pain or fever. 06/18/16   Hongalgi, Maximino Greenland, MD  albuterol (PROVENTIL) (2.5 MG/3ML) 0.083% nebulizer solution Take 2.5 mg by nebulization every 6 (six) hours as needed for wheezing or shortness of breath.    [provider]  amiodarone (PACERONE) 100 MG tablet Place 100 mg into feeding  tube daily. 08/26/15   [provider]  chlorhexidine (PERIDEX) 0.12 % solution Use as directed 15 mLs in the mouth or throat 2 (two) times daily.    [provider]  Eyelid Cleansers (OCUSOFT LID SCRUB) PADS Apply 1 each topically 2 (two) times daily.    [provider]  famotidine (PEPCID) 20 MG tablet Place 1 tablet (20 mg total) into feeding tube 2 (two) times daily. Patient taking differently: Place 20 mg into feeding tube at bedtime.  06/18/16   Hongalgi, Maximino Greenland, MD  furosemide (LASIX) 40 MG tablet Place 40 mg into feeding tube daily.    [provider]  lacosamide (VIMPAT) 10 MG/ML oral solution Take 10 mLs (100 mg total) by mouth 2 (two) times daily. 01/11/17 02/10/17  Amin, Loura Halt, MD  Lacosamide (VIMPAT) 100 MG TABS Place 1 tablet (100 mg total) into feeding tube 2 (two) times daily. Patient not taking: Reported on 01/04/2017 06/18/16   Elease Etienne, MD  levETIRAcetam (KEPPRA) 100 MG/ML solution Take 10 mLs (1,000 mg total) by mouth 2 (two) times daily. 01/11/17 02/10/17  Amin, Loura Halt, MD  lidocaine (XYLOCAINE) 2 % solution Use as directed 20 mLs in the mouth or throat as needed for mouth pain.    [provider]  midodrine (PROAMATINE) 10 MG tablet Place 10 mg into feeding tube every 8 (eight) hours. 05/25/16   [provider]  Multiple Vitamin (MULTIVITAMIN WITH MINERALS) TABS tablet Place 1 tablet into feeding tube daily. 06/18/16   Hongalgi, Maximino Greenland, MD  Nutritional Supplements (FEEDING SUPPLEMENT, VITAL AF 1.2 CAL,) LIQD Place 1,000 mLs into feeding tube continuous. 1,000 mL, Per Tube, at 70 mL/hr, Continuous 06/18/16   Hongalgi, Maximino Greenland, MD  phenazopyridine (PYRIDIUM) 200 MG tablet Take 200 mg by mouth 2 (two) times daily.    [provider]  saccharomyces boulardii (FLORASTOR) 250 MG capsule Take 1 capsule (250 mg total) by mouth 2 (two) times daily. Via Tube. 06/18/16   Hongalgi, Maximino Greenland, MD  traMADol (ULTRAM) 50 MG  tablet Take 50 mg by mouth 2 (two) times daily.    [provider]  vitamin C (ASCORBIC ACID) 500 MG tablet Place 1 tablet (500 mg total) into feeding tube daily. Patient taking differently: Place 500 mg into feeding tube 2 (two) times daily.  06/18/16   Hongalgi, Maximino Greenland, MD  Water For Irrigation, Sterile (FREE WATER) SOLN Place 200 mLs into feeding tube every 8 (eight) hours. 06/18/16   Hongalgi, Maximino Greenland, MD    Family History History reviewed. No pertinent family history.  Social History Social History  Substance Use Topics  . Smoking status: Never Smoker  . Smokeless tobacco: Never Used  . Alcohol use No     Allergies   Ativan [lorazepam]; Azithromycin; Ceftriaxone; and Vancomycin   Review of Systems Review of Systems  Unable to perform ROS: Patient nonverbal     Physical Exam Updated Vital Signs BP  96/73 (BP Location: Left Arm)   Pulse (!) 107   Temp 98.2 F (36.8 C) (Axillary)   Resp 16   SpO2 95%   Physical Exam  Constitutional: He appears well-developed and well-nourished. No distress.  HENT:  Head: Normocephalic and atraumatic.  Mouth/Throat: Oropharynx is clear and moist.  Eyes: Conjunctivae and EOM are normal. Pupils are equal, round, and reactive to light.  Neck: Normal range of motion. Neck supple.  Trach in place without secretions  Cardiovascular: Intact distal pulses.  An irregular rhythm present. Tachycardia present.   No murmur heard. Pulmonary/Chest: Effort normal and breath sounds normal. No respiratory distress. He has no wheezes. He has no rales.  Abdominal: Soft. He exhibits no distension. There is no tenderness. There is no rebound and no guarding.  Peg tube present without drainage  Genitourinary:  Genitourinary Comments: Foley present with sediment in the tube and urine orange in color  Musculoskeletal: Normal range of motion. He exhibits no edema or tenderness.  Stage 3 ulcer over the left hip.  Superficial wound over the left  knee.  Left upper arm is in cushioned device  Neurological: He is alert.  Contractures in bilateral upper and lower ext.  Pt does nod to questions but can only say yes and no  Skin: Skin is warm and dry. No rash noted. No erythema.  Psychiatric: He has a normal mood and affect. His behavior is normal.  Nursing note and vitals reviewed.    ED Treatments / Results  Labs (all labs ordered are listed, but only abnormal results are displayed) Labs Reviewed  URINALYSIS, ROUTINE W REFLEX MICROSCOPIC - Abnormal; Notable for the following:       Result Value   Color, Urine AMBER (*)    APPearance HAZY (*)    Hgb urine dipstick SMALL (*)    Protein, ur >=300 (*)    Nitrite POSITIVE (*)    Leukocytes, UA MODERATE (*)    Bacteria, UA RARE (*)    Squamous Epithelial / LPF 0-5 (*)    All other components within normal limits  COMPREHENSIVE METABOLIC PANEL - Abnormal; Notable for the following:    Sodium 147 (*)    Chloride 113 (*)    BUN 31 (*)    Calcium 8.5 (*)    Albumin 2.4 (*)    All other components within normal limits  URINE CULTURE  CULTURE, BLOOD (ROUTINE X 2)  CULTURE, BLOOD (ROUTINE X 2)  I-STAT CG4 LACTIC ACID, ED  I-STAT CG4 LACTIC ACID, ED    EKG  EKG Interpretation  Date/Time:  Thursday Feb 07 2017 17:04:36 EDT Ventricular Rate:  108 PR Interval:    QRS Duration: 93 QT Interval:  365 QTC Calculation: 490 R Axis:   -79 Text Interpretation:  Sinus tachycardia Probable left atrial enlargement Left anterior fascicular block RSR' in V1 or V2, right VCD or RVH Probable left ventricular hypertrophy ST elevation, consider inferior injury Borderline prolonged QT interval No significant change since last tracing Confirmed by Gwyneth Sprout ([id number]) on 02/07/2017 5:09:59 PM       Radiology Dg Chest Port 1 View  Result Date: 02/07/2017 CLINICAL DATA:  Tachycardia EXAM: PORTABLE CHEST 1 VIEW COMPARISON:  01/05/2017 FINDINGS: There is a new extended length tracheostomy  tube with tip terminating 2 cm above the carina. Heart is enlarged but stable. There is mild aortic atherosclerosis. Some improved aeration of bibasilar airspace consolidations since prior exam, more so on the left. No new pulmonary consolidation or  CHF. No significant effusion or pneumothorax. No acute nor suspicious osseous abnormalities. Degenerative changes are seen about the dorsal spine and both shoulders. IMPRESSION: 1. Partial clearing of bibasilar pneumonic consolidations more so on the left since prior exam. 2. New extended length tracheostomy tube tip is seen 2 cm above the carina. 3. Stable cardiomegaly with aortic atherosclerosis. Electronically Signed   By: Tollie Eth M.D.   On: 02/07/2017 16:39    Procedures Procedures (including critical care time)  Medications Ordered in ED Medications  sodium chloride 0.9 % bolus 1,000 mL (0 mLs Intravenous Stopped 02/07/17 1813)  sodium chloride 0.9 % bolus 1,000 mL (1,000 mLs Intravenous New Bag/Given 02/07/17 1959)     Initial Impression / Assessment and Plan / ED Course  I have reviewed the triage vital signs and the nursing notes.  Pertinent labs & imaging results that were available during my care of the patient were reviewed by me and considered in my medical decision making (see chart for details).    Patient with multiple medical problems who lives in an LTAC who is vent dependent presenting for hypernatremia and unable to get a line. Patient's sodium today was 165 with a normal creatinine and BUN elevated at 41. Facility states the patient started Cipro over the weekend for concern for pneumonia. However they deny increased secretions from the trach for known fever. On exam patient is hot to the touch and tachycardic. Blood pressure is in the low 100s. Patient's oxygen saturation is 95%. Discussing with the facility family was adamant that they did not want a PICC line placed at the facility and they wanted him to come to the hospital  for treatment. Despite multiple people speaking with them they insisted that patient be sent here. Lactate, UA, blood culture, urine culture, rectal temperature pending. We'll attempt line and start IV fluids.   7:36 PM Patient's lactate here is within normal limits. Tachycardia improves with IV fluids through an IV in his foot. Unable to get a PICC line this evening. Foley catheter was changed in UA from a clean catheter shows concern for infection. Culture done.  Patient did receive Cipro today. We'll discuss with critical care as family does not want patient to return to [facility] until labs start normalizing.  8:23 PM Discussed with ICU and recommended rechecking sodium.   9:54 PM Repeat sodium here is 147 after fluids. Discussed with critical care and feel this point it is reasonable as an patient back to his facility. Urine culture is pending patient has been taking Cipro. He is not febrile and is otherwise in no acute distress. Vital signs improved after fluids.  Final Clinical Impressions(s) / ED Diagnoses   Final diagnoses:  Dehydration with hypernatremia    New Prescriptions New Prescriptions   No medications on file     Gwyneth Sprout, MD 02/07/17 2218

## 2017-02-09 LAB — URINE CULTURE: Culture: >=100000 — AB

## 2017-02-10 ENCOUNTER — Telehealth: Payer: Self-pay

## 2017-02-10 NOTE — Telephone Encounter (Signed)
Post ED Visit - Positive Culture Follow-up: Unsuccessful Patient Follow-up  Culture assessed and recommendations reviewed by:  []  Enzo Bi, Pharm.D. []  Celedonio Miyamoto, Pharm.D., BCPS AQ-ID []  Garvin Fila, Pharm.D., BCPS []  Georgina Pillion, Pharm.D., BCPS []  [place], [address].D., BCPS, AAHIVP []  Estella Husk, Pharm.D., BCPS, AAHIVP []  Lysle Pearl, PharmD, BCPS [x]  Casilda Carls, PharmD, BCPS []  Pollyann Samples, PharmD, BCPS  Positive urine culture  [x]  Patient discharged without antimicrobial prescription and treatment is now indicated []  Organism is resistant to prescribed ED discharge antimicrobial []  Patient with positive blood cultures   Unable to contact patient after 3 attempts, letter will be sent to address on file  Jerry Caras 02/10/2017, 1:33 PM

## 2017-02-10 NOTE — Progress Notes (Signed)
ED Antimicrobial Stewardship Positive Culture Follow Up   Dean Graham is an 56 y.o. male who presented to [facility] on 02/07/2017 with a chief complaint of  Chief Complaint  Patient presents with  . Hypertension    Recent Results (from the past 720 hour(s))  Culture, blood (Routine X 2) w Reflex to ID Panel     Status: None (Preliminary result)   Collection Time: 02/07/17  5:14 PM  Result Value Ref Range Status   Specimen Description BLOOD LEFT FOOT  Final   Special Requests   Final    IN PEDIATRIC BOTTLE Blood Culture results may not be optimal due to an excessive volume of blood received in culture bottles   Culture NO GROWTH 2 DAYS  Final   Report Status PENDING  Incomplete  Urine culture     Status: Abnormal   Collection Time: 02/07/17  5:34 PM  Result Value Ref Range Status   Specimen Description URINE, RANDOM  Final   Special Requests NONE  Final   Culture >=100,000 COLONIES/mL PROTEUS MIRABILIS (A)  Final   Report Status 02/09/2017 FINAL  Final   Organism ID, Bacteria PROTEUS MIRABILIS (A)  Final      Susceptibility   Proteus mirabilis - MIC*    AMPICILLIN <=2 SENSITIVE Sensitive     CEFAZOLIN 8 SENSITIVE Sensitive     CEFTRIAXONE <=1 SENSITIVE Sensitive     CIPROFLOXACIN >=4 RESISTANT Resistant     GENTAMICIN <=1 SENSITIVE Sensitive     IMIPENEM 2 SENSITIVE Sensitive     NITROFURANTOIN 128 RESISTANT Resistant     TRIMETH/SULFA <=20 SENSITIVE Sensitive     AMPICILLIN/SULBACTAM <=2 SENSITIVE Sensitive     PIP/TAZO <=4 SENSITIVE Sensitive     * >=100,000 COLONIES/mL PROTEUS MIRABILIS  Culture, blood (Routine X 2) w Reflex to ID Panel     Status: None (Preliminary result)   Collection Time: 02/07/17  8:16 PM  Result Value Ref Range Status   Specimen Description BLOOD RIGHT FOOT  Final   Special Requests IN PEDIATRIC BOTTLE Blood Culture adequate volume  Final   Culture NO GROWTH 2 DAYS  Final   Report Status PENDING  Incomplete    [x]  Treated with  ciprofloxacin, organism resistant to prescribed antimicrobial  New antibiotic prescription: Stop ciprofloxacin. Start Bactrim DS, Take 1 tablet PO BID x 7 days  ED Provider: [place] Russo, PA-C  Casilda Carls, PharmD, BCPS PGY-2 Infectious Diseases Pharmacy Resident Pager: [phone] 02/10/2017, 12:34 PM

## 2017-02-12 LAB — CULTURE, BLOOD (ROUTINE X 2)
CULTURE: NO GROWTH
Culture: NO GROWTH
Special Requests: ADEQUATE

## 2017-03-11 ENCOUNTER — Telehealth: Payer: Self-pay | Admitting: Emergency Medicine

## 2017-03-11 NOTE — Telephone Encounter (Signed)
LOST TO FOLLOWUP 

## 2017-04-09 ENCOUNTER — Inpatient Hospital Stay (HOSPITAL_COMMUNITY)
Admission: EM | Admit: 2017-04-09 | Discharge: 2017-04-15 | DRG: 871 | Disposition: A | Discharge status: Skilled Nursing Facility | Payer: Medicare Other | Attending: Internal Medicine | Admitting: Internal Medicine

## 2017-04-09 ENCOUNTER — Encounter (HOSPITAL_COMMUNITY): Payer: Self-pay | Admitting: Emergency Medicine

## 2017-04-09 ENCOUNTER — Emergency Department (HOSPITAL_COMMUNITY): Payer: Medicare Other

## 2017-04-09 DIAGNOSIS — S069X0D Unspecified intracranial injury without loss of consciousness, subsequent encounter: Secondary | ICD-10-CM | POA: Diagnosis not present

## 2017-04-09 DIAGNOSIS — N179 Acute kidney failure, unspecified: Secondary | ICD-10-CM | POA: Diagnosis present

## 2017-04-09 DIAGNOSIS — E870 Hyperosmolality and hypernatremia: Secondary | ICD-10-CM | POA: Diagnosis present

## 2017-04-09 DIAGNOSIS — G8250 Quadriplegia, unspecified: Secondary | ICD-10-CM | POA: Diagnosis present

## 2017-04-09 DIAGNOSIS — Z8669 Personal history of other diseases of the nervous system and sense organs: Secondary | ICD-10-CM | POA: Diagnosis not present

## 2017-04-09 DIAGNOSIS — A4159 Other Gram-negative sepsis: Principal | ICD-10-CM | POA: Diagnosis present

## 2017-04-09 DIAGNOSIS — Z888 Allergy status to other drugs, medicaments and biological substances status: Secondary | ICD-10-CM

## 2017-04-09 DIAGNOSIS — Z978 Presence of other specified devices: Secondary | ICD-10-CM

## 2017-04-09 DIAGNOSIS — K219 Gastro-esophageal reflux disease without esophagitis: Secondary | ICD-10-CM | POA: Diagnosis present

## 2017-04-09 DIAGNOSIS — G40909 Epilepsy, unspecified, not intractable, without status epilepticus: Secondary | ICD-10-CM

## 2017-04-09 DIAGNOSIS — I9589 Other hypotension: Secondary | ICD-10-CM | POA: Diagnosis present

## 2017-04-09 DIAGNOSIS — E876 Hypokalemia: Secondary | ICD-10-CM | POA: Diagnosis not present

## 2017-04-09 DIAGNOSIS — R0789 Other chest pain: Secondary | ICD-10-CM | POA: Diagnosis present

## 2017-04-09 DIAGNOSIS — M86652 Other chronic osteomyelitis, left thigh: Secondary | ICD-10-CM | POA: Diagnosis present

## 2017-04-09 DIAGNOSIS — A419 Sepsis, unspecified organism: Secondary | ICD-10-CM

## 2017-04-09 DIAGNOSIS — R6510 Systemic inflammatory response syndrome (SIRS) of non-infectious origin without acute organ dysfunction: Secondary | ICD-10-CM | POA: Diagnosis present

## 2017-04-09 DIAGNOSIS — Z930 Tracheostomy status: Secondary | ICD-10-CM

## 2017-04-09 DIAGNOSIS — Z87820 Personal history of traumatic brain injury: Secondary | ICD-10-CM | POA: Diagnosis not present

## 2017-04-09 DIAGNOSIS — Z8679 Personal history of other diseases of the circulatory system: Secondary | ICD-10-CM

## 2017-04-09 DIAGNOSIS — I509 Heart failure, unspecified: Secondary | ICD-10-CM | POA: Diagnosis not present

## 2017-04-09 DIAGNOSIS — N12 Tubulo-interstitial nephritis, not specified as acute or chronic: Secondary | ICD-10-CM | POA: Diagnosis present

## 2017-04-09 DIAGNOSIS — Z9889 Other specified postprocedural states: Secondary | ICD-10-CM | POA: Diagnosis not present

## 2017-04-09 DIAGNOSIS — M869 Osteomyelitis, unspecified: Secondary | ICD-10-CM | POA: Diagnosis not present

## 2017-04-09 DIAGNOSIS — I5032 Chronic diastolic (congestive) heart failure: Secondary | ICD-10-CM | POA: Diagnosis present

## 2017-04-09 DIAGNOSIS — Z1623 Resistance to quinolones and fluoroquinolones: Secondary | ICD-10-CM | POA: Diagnosis present

## 2017-04-09 DIAGNOSIS — Z87440 Personal history of urinary (tract) infections: Secondary | ICD-10-CM

## 2017-04-09 DIAGNOSIS — Z880 Allergy status to penicillin: Secondary | ICD-10-CM

## 2017-04-09 DIAGNOSIS — J9811 Atelectasis: Secondary | ICD-10-CM | POA: Diagnosis present

## 2017-04-09 DIAGNOSIS — I482 Chronic atrial fibrillation: Secondary | ICD-10-CM | POA: Diagnosis present

## 2017-04-09 DIAGNOSIS — Z960 Presence of urogenital implants: Secondary | ICD-10-CM

## 2017-04-09 DIAGNOSIS — R8290 Unspecified abnormal findings in urine: Secondary | ICD-10-CM | POA: Diagnosis not present

## 2017-04-09 DIAGNOSIS — R042 Hemoptysis: Secondary | ICD-10-CM | POA: Diagnosis not present

## 2017-04-09 DIAGNOSIS — Z0001 Encounter for general adult medical examination with abnormal findings: Secondary | ICD-10-CM | POA: Diagnosis not present

## 2017-04-09 DIAGNOSIS — J9611 Chronic respiratory failure with hypoxia: Secondary | ICD-10-CM | POA: Diagnosis present

## 2017-04-09 DIAGNOSIS — B964 Proteus (mirabilis) (morganii) as the cause of diseases classified elsewhere: Secondary | ICD-10-CM | POA: Diagnosis present

## 2017-04-09 DIAGNOSIS — L89224 Pressure ulcer of left hip, stage 4: Secondary | ICD-10-CM | POA: Diagnosis present

## 2017-04-09 DIAGNOSIS — E1169 Type 2 diabetes mellitus with other specified complication: Secondary | ICD-10-CM | POA: Diagnosis present

## 2017-04-09 DIAGNOSIS — E8809 Other disorders of plasma-protein metabolism, not elsewhere classified: Secondary | ICD-10-CM | POA: Diagnosis present

## 2017-04-09 DIAGNOSIS — Z1624 Resistance to multiple antibiotics: Secondary | ICD-10-CM | POA: Diagnosis present

## 2017-04-09 DIAGNOSIS — Z66 Do not resuscitate: Secondary | ICD-10-CM | POA: Diagnosis present

## 2017-04-09 DIAGNOSIS — E860 Dehydration: Secondary | ICD-10-CM | POA: Diagnosis present

## 2017-04-09 DIAGNOSIS — N319 Neuromuscular dysfunction of bladder, unspecified: Secondary | ICD-10-CM | POA: Diagnosis present

## 2017-04-09 DIAGNOSIS — E872 Acidosis: Secondary | ICD-10-CM | POA: Diagnosis present

## 2017-04-09 DIAGNOSIS — Z881 Allergy status to other antibiotic agents status: Secondary | ICD-10-CM

## 2017-04-09 DIAGNOSIS — T83511A Infection and inflammatory reaction due to indwelling urethral catheter, initial encounter: Secondary | ICD-10-CM

## 2017-04-09 DIAGNOSIS — R1310 Dysphagia, unspecified: Secondary | ICD-10-CM | POA: Diagnosis present

## 2017-04-09 DIAGNOSIS — R509 Fever, unspecified: Secondary | ICD-10-CM | POA: Diagnosis present

## 2017-04-09 DIAGNOSIS — Z79899 Other long term (current) drug therapy: Secondary | ICD-10-CM

## 2017-04-09 DIAGNOSIS — Z9289 Personal history of other medical treatment: Secondary | ICD-10-CM

## 2017-04-09 DIAGNOSIS — E119 Type 2 diabetes mellitus without complications: Secondary | ICD-10-CM

## 2017-04-09 DIAGNOSIS — Z8614 Personal history of Methicillin resistant Staphylococcus aureus infection: Secondary | ICD-10-CM

## 2017-04-09 DIAGNOSIS — N390 Urinary tract infection, site not specified: Secondary | ICD-10-CM | POA: Diagnosis not present

## 2017-04-09 DIAGNOSIS — Z7401 Bed confinement status: Secondary | ICD-10-CM

## 2017-04-09 DIAGNOSIS — Z931 Gastrostomy status: Secondary | ICD-10-CM

## 2017-04-09 DIAGNOSIS — N10 Acute tubulo-interstitial nephritis: Secondary | ICD-10-CM | POA: Diagnosis not present

## 2017-04-09 LAB — COMPREHENSIVE METABOLIC PANEL WITH GFR
ALT: 16 U/L — ABNORMAL LOW (ref 17–63)
AST: 28 U/L (ref 15–41)
Albumin: 2.8 g/dL — ABNORMAL LOW (ref 3.5–5.0)
Alkaline Phosphatase: 78 U/L (ref 38–126)
Anion gap: 8 (ref 5–15)
BUN: 60 mg/dL — ABNORMAL HIGH (ref 6–20)
CO2: 30 mmol/L (ref 22–32)
Calcium: 8.8 mg/dL — ABNORMAL LOW (ref 8.9–10.3)
Chloride: 98 mmol/L — ABNORMAL LOW (ref 101–111)
Creatinine, Ser: 1.26 mg/dL — ABNORMAL HIGH (ref 0.61–1.24)
GFR calc Af Amer: >60 mL/min
GFR calc non Af Amer: >60 mL/min
Glucose, Bld: 95 mg/dL (ref 65–99)
Potassium: 4.1 mmol/L (ref 3.5–5.1)
Sodium: 136 mmol/L (ref 135–145)
Total Bilirubin: 0.4 mg/dL (ref 0.3–1.2)
Total Protein: 6.7 g/dL (ref 6.5–8.1)

## 2017-04-09 LAB — URINALYSIS, ROUTINE W REFLEX MICROSCOPIC
BILIRUBIN URINE: NEGATIVE
Glucose, UA: NEGATIVE mg/dL
Hgb urine dipstick: NEGATIVE
KETONES UR: NEGATIVE mg/dL
Nitrite: POSITIVE — AB
PH: 5 (ref 5.0–8.0)
Protein, ur: NEGATIVE mg/dL
Specific Gravity, Urine: 1.01 (ref 1.005–1.030)

## 2017-04-09 LAB — I-STAT CG4 LACTIC ACID, ED
Lactic Acid, Venous: 1.49 mmol/L (ref 0.5–1.9)
Lactic Acid, Venous: 2.2 mmol/L (ref 0.5–1.9)

## 2017-04-09 LAB — CBC WITH DIFFERENTIAL/PLATELET
BASOS ABS: 0 10*3/uL (ref 0.0–0.1)
Basophils Relative: 0 %
EOS PCT: 2 %
Eosinophils Absolute: 0.3 10*3/uL (ref 0.0–0.7)
HEMATOCRIT: 25.6 % — AB (ref 39.0–52.0)
Hemoglobin: 8.1 g/dL — ABNORMAL LOW (ref 13.0–17.0)
LYMPHS PCT: 13 %
Lymphs Abs: 2 10*3/uL (ref 0.7–4.0)
MCH: 26.7 pg (ref 26.0–34.0)
MCHC: 31.6 g/dL (ref 30.0–36.0)
MCV: 84.5 fL (ref 78.0–100.0)
Monocytes Absolute: 1.3 10*3/uL — ABNORMAL HIGH (ref 0.1–1.0)
Monocytes Relative: 9 %
NEUTROS PCT: 76 %
Neutro Abs: 12.1 10*3/uL — ABNORMAL HIGH (ref 1.7–7.7)
PLATELETS: 346 10*3/uL (ref 150–400)
RBC: 3.03 MIL/uL — AB (ref 4.22–5.81)
RDW: 15.9 % — ABNORMAL HIGH (ref 11.5–15.5)
WBC: 15.7 10*3/uL — AB (ref 4.0–10.5)

## 2017-04-09 LAB — RESPIRATORY PANEL BY PCR
ADENOVIRUS-RVPPCR: NOT DETECTED
Bordetella pertussis: NOT DETECTED
CORONAVIRUS 229E-RVPPCR: NOT DETECTED
CORONAVIRUS HKU1-RVPPCR: NOT DETECTED
CORONAVIRUS NL63-RVPPCR: NOT DETECTED
Chlamydophila pneumoniae: NOT DETECTED
Coronavirus OC43: NOT DETECTED
Influenza A: NOT DETECTED
Influenza B: NOT DETECTED
METAPNEUMOVIRUS-RVPPCR: NOT DETECTED
Mycoplasma pneumoniae: NOT DETECTED
PARAINFLUENZA VIRUS 1-RVPPCR: NOT DETECTED
PARAINFLUENZA VIRUS 2-RVPPCR: NOT DETECTED
PARAINFLUENZA VIRUS 3-RVPPCR: NOT DETECTED
Parainfluenza Virus 4: NOT DETECTED
RHINOVIRUS / ENTEROVIRUS - RVPPCR: NOT DETECTED
Respiratory Syncytial Virus: NOT DETECTED

## 2017-04-09 LAB — LACTIC ACID, PLASMA
Lactic Acid, Venous: 2.2 mmol/L (ref 0.5–1.9)
Lactic Acid, Venous: 1.4 mmol/L (ref 0.5–1.9)
Lactic Acid, Venous: 1 mmol/L (ref 0.5–1.9)

## 2017-04-09 LAB — CBG MONITORING, ED
GLUCOSE-CAPILLARY: 90 mg/dL (ref 65–99)
Glucose-Capillary: 92 mg/dL (ref 65–99)
Glucose-Capillary: 84 mg/dL (ref 65–99)
Glucose-Capillary: 71 mg/dL (ref 65–99)
Glucose-Capillary: 100 mg/dL — ABNORMAL HIGH (ref 65–99)

## 2017-04-09 LAB — GLUCOSE, CAPILLARY: GLUCOSE-CAPILLARY: 78 mg/dL (ref 65–99)

## 2017-04-09 LAB — PROCALCITONIN: Procalcitonin: 3.12 ng/mL

## 2017-04-09 LAB — STREP PNEUMONIAE URINARY ANTIGEN: STREP PNEUMO URINARY ANTIGEN: NEGATIVE

## 2017-04-09 LAB — HIV ANTIBODY (ROUTINE TESTING W REFLEX): HIV Screen 4th Generation wRfx: NONREACTIVE

## 2017-04-09 MED ORDER — MIDODRINE HCL 5 MG PO TABS: 5.0000 mg | ORAL_TABLET | Freq: Three times a day (TID) | ORAL | Status: DC

## 2017-04-09 MED ORDER — INSULIN ASPART 100 UNIT/ML ~~LOC~~ SOLN
0.0000 [IU] | SUBCUTANEOUS | Status: DC
  Administered 2017-04-11 – 2017-04-13 (×7): 1 [IU] via SUBCUTANEOUS
  Administered 2017-04-14: 2 [IU] via SUBCUTANEOUS
  Administered 2017-04-14 – 2017-04-15 (×4): 1 [IU] via SUBCUTANEOUS

## 2017-04-09 MED ORDER — ENOXAPARIN SODIUM 40 MG/0.4ML ~~LOC~~ SOLN
40.0000 mg | Freq: Every day | SUBCUTANEOUS | Status: DC
  Administered 2017-04-09 – 2017-04-14 (×6): 40 mg via SUBCUTANEOUS
  Filled 2017-04-09 (×6): qty 0.4

## 2017-04-09 MED ORDER — MIDODRINE HCL 5 MG PO TABS
10.0000 mg | ORAL_TABLET | Freq: Three times a day (TID) | ORAL | Status: DC
  Administered 2017-04-09 – 2017-04-10 (×6): 10 mg via ORAL
  Filled 2017-04-09 (×8): qty 2

## 2017-04-09 MED ORDER — LEVOFLOXACIN IN D5W 750 MG/150ML IV SOLN
750.0000 mg | Freq: Once | INTRAVENOUS | Status: AC
  Administered 2017-04-09: 750 mg via INTRAVENOUS
  Filled 2017-04-09: qty 150

## 2017-04-09 MED ORDER — LACOSAMIDE 50 MG PO TABS
100.0000 mg | ORAL_TABLET | Freq: Two times a day (BID) | ORAL | Status: DC
  Administered 2017-04-09 – 2017-04-15 (×13): 100 mg
  Filled 2017-04-09 (×14): qty 2

## 2017-04-09 MED ORDER — TRAMADOL HCL 50 MG PO TABS
50.0000 mg | ORAL_TABLET | Freq: Two times a day (BID) | ORAL | Status: DC
  Administered 2017-04-09 – 2017-04-10 (×2): 50 mg via ORAL
  Filled 2017-04-09 (×2): qty 1

## 2017-04-09 MED ORDER — SODIUM CHLORIDE 0.9% FLUSH
3.0000 mL | Freq: Two times a day (BID) | INTRAVENOUS | Status: DC
  Administered 2017-04-09 – 2017-04-15 (×12): 3 mL via INTRAVENOUS

## 2017-04-09 MED ORDER — FAMOTIDINE 20 MG PO TABS
20.0000 mg | ORAL_TABLET | Freq: Two times a day (BID) | ORAL | Status: DC
  Administered 2017-04-09 – 2017-04-15 (×13): 20 mg
  Filled 2017-04-09 (×13): qty 1

## 2017-04-09 MED ORDER — LINEZOLID 600 MG/300ML IV SOLN
600.0000 mg | Freq: Two times a day (BID) | INTRAVENOUS | Status: DC
  Administered 2017-04-09: 600 mg via INTRAVENOUS
  Filled 2017-04-09: qty 300

## 2017-04-09 MED ORDER — ACETAMINOPHEN 650 MG RE SUPP: 650.0000 mg | Freq: Four times a day (QID) | RECTAL | Status: DC | PRN

## 2017-04-09 MED ORDER — SODIUM CHLORIDE 0.9 % IV SOLN
3.0000 g | Freq: Four times a day (QID) | INTRAVENOUS | Status: DC
  Filled 2017-04-09 (×2): qty 3

## 2017-04-09 MED ORDER — CHLORHEXIDINE GLUCONATE 0.12 % MT SOLN
15.0000 mL | Freq: Two times a day (BID) | OROMUCOSAL | Status: DC
  Administered 2017-04-09 – 2017-04-15 (×12): 15 mL via OROMUCOSAL
  Filled 2017-04-09 (×11): qty 15

## 2017-04-09 MED ORDER — MIDODRINE HCL 5 MG PO TABS: 10.0000 mg | ORAL_TABLET | Freq: Three times a day (TID) | ORAL | Status: DC | PRN

## 2017-04-09 MED ORDER — ONDANSETRON HCL 4 MG/2ML IJ SOLN: 4.0000 mg | Freq: Four times a day (QID) | INTRAMUSCULAR | Status: DC | PRN

## 2017-04-09 MED ORDER — SODIUM CHLORIDE 0.9 % IV BOLUS (SEPSIS)
1000.0000 mL | Freq: Once | INTRAVENOUS | Status: AC
  Administered 2017-04-09: 1000 mL via INTRAVENOUS

## 2017-04-09 MED ORDER — LEVETIRACETAM 100 MG/ML PO SOLN
1000.0000 mg | Freq: Two times a day (BID) | ORAL | Status: DC
  Administered 2017-04-09 – 2017-04-10 (×3): 1000 mg via ORAL
  Filled 2017-04-09 (×4): qty 10

## 2017-04-09 MED ORDER — ADULT MULTIVITAMIN W/MINERALS CH
1.0000 | ORAL_TABLET | Freq: Every day | ORAL | Status: DC
  Administered 2017-04-10 – 2017-04-15 (×6): 1
  Filled 2017-04-09 (×6): qty 1

## 2017-04-09 MED ORDER — SODIUM CHLORIDE 0.9 % IV SOLN
INTRAVENOUS | Status: DC
  Administered 2017-04-09: 08:00:00 via INTRAVENOUS

## 2017-04-09 MED ORDER — SODIUM CHLORIDE 0.9 % IV BOLUS (SEPSIS)
2000.0000 mL | Freq: Once | INTRAVENOUS | Status: AC
  Administered 2017-04-09: 1000 mL via INTRAVENOUS

## 2017-04-09 MED ORDER — DEXTROSE 5 % IV SOLN
2.0000 g | Freq: Once | INTRAVENOUS | Status: AC
  Administered 2017-04-09: 2 g via INTRAVENOUS
  Filled 2017-04-09: qty 2

## 2017-04-09 MED ORDER — SODIUM CHLORIDE 0.9 % IV SOLN
3.0000 g | Freq: Three times a day (TID) | INTRAVENOUS | Status: DC
  Administered 2017-04-09 – 2017-04-10 (×3): 3 g via INTRAVENOUS
  Filled 2017-04-09 (×5): qty 3

## 2017-04-09 MED ORDER — DEXTROSE-NACL 5-0.9 % IV SOLN
INTRAVENOUS | Status: DC
  Administered 2017-04-09 – 2017-04-10 (×2): via INTRAVENOUS
  Administered 2017-04-11: 1000 mL via INTRAVENOUS

## 2017-04-09 MED ORDER — ACETAMINOPHEN 325 MG PO TABS: 650.0000 mg | ORAL_TABLET | Freq: Four times a day (QID) | ORAL | Status: DC | PRN

## 2017-04-09 MED ORDER — DIAZEPAM 5 MG PO TABS
5.0000 mg | ORAL_TABLET | Freq: Two times a day (BID) | ORAL | Status: DC
  Administered 2017-04-09 – 2017-04-13 (×9): 5 mg
  Filled 2017-04-09 (×9): qty 1

## 2017-04-09 MED ORDER — SACCHAROMYCES BOULARDII 250 MG PO CAPS
250.0000 mg | ORAL_CAPSULE | Freq: Two times a day (BID) | ORAL | Status: DC
  Administered 2017-04-09 – 2017-04-10 (×2): 250 mg via ORAL
  Filled 2017-04-09 (×2): qty 1

## 2017-04-09 MED ORDER — OCUSOFT LID SCRUB EX PADS: 1.0000 | MEDICATED_PAD | Freq: Two times a day (BID) | CUTANEOUS | Status: DC

## 2017-04-09 MED ORDER — PHENAZOPYRIDINE HCL 200 MG PO TABS
200.0000 mg | ORAL_TABLET | Freq: Two times a day (BID) | ORAL | Status: DC
  Administered 2017-04-09 – 2017-04-10 (×3): 200 mg via ORAL
  Filled 2017-04-09: qty 2
  Filled 2017-04-09 (×3): qty 1

## 2017-04-09 MED ORDER — ONDANSETRON HCL 4 MG PO TABS: 4.0000 mg | ORAL_TABLET | Freq: Four times a day (QID) | ORAL | Status: DC | PRN

## 2017-04-09 NOTE — Progress Notes (Signed)
Received patient from [facility] patient was transported from [facility].  Has a #8 Brovona trach.  Pt placed on aerosol trach collar set up  at 35% Fio2. Pt was assessed and tracheal suctioned.  Pt tolerating well at this time.  RT to monitor pt.

## 2017-04-09 NOTE — ED Notes (Signed)
Lab Results reported to Dr.Goldston.

## 2017-04-09 NOTE — Consult Note (Signed)
[facility]    Date of Admission:  04/09/2017    Total days of antibiotics.1         1 dose of Azactam         1 dose of Levaquin.         1 dose of Zyvox  Day 1 of Unasyn.       Reason for Consult: History of multiple drug resistance infections.    Referring Provider: Arlyss Queen MD Primary Care Provider: Hillary Bow, MD   Assessment: [name], 56 year old gentleman who is trach and PEG tube dependent for many years. Had Foley is in place. He has leukocytosis and lactic acidosis and appears dehydrated. UA appears dirty but has chronic Foley. Although chest x-ray was positive for possible right lower lobe infiltrate versus atelectasis, he does not has any clinical symptoms for pneumonia or respiratory infections, definitely high risk because of chronic vent dependency through tracheostomy. Respiratory panel is negative. Respiratory cultures are pending but will most likely be positive because of colonization. Currently pneumonia appears less likely. We will treat him as UTI, and start him on antibiotics according to previous urine culture results. We will adjust his antibiotics once more data becomes available. Patient appears dehydrated and will need more fluid resuscitation per primary team.    Plan: 1. Discontinue Zyvox. 2. Start him on Unasyn. 3. Removed Foley. 4. Place condom catheter.  Principal Problem:   Sepsis (HCC) Active Problems:   Chronic diastolic heart failure (HCC)   GERD (gastroesophageal reflux disease)   Abnormal urinalysis   Atrial fibrillation, currently in sinus rhythm   Tracheostomy in place [facility])   Chronic respiratory failure with hypoxia (HCC)   Chronic indwelling Foley catheter   Seizure disorder/ history of TBI in childhood   Diabetes mellitus type 2, diet-controlled (HCC)   Neurogenic bladder   Chronic hypotension   H/O quadriplegia   Acute kidney injury (HCC)   . diazepam  5 mg Per Tube BID  .  famotidine  20 mg Per Tube BID  . insulin aspart  0-9 Units Subcutaneous Q4H  . lacosamide  100 mg Per Tube BID  . levETIRAcetam  1,000 mg Oral BID  . midodrine  10 mg Oral TID WC  . phenazopyridine  200 mg Oral BID    HPI: Dean Graham is a 56 y.o. male with PMHx significant for traumatic brain injury in 1969 with resultant quadriplegia, when dependent with tracheostomy and PEG dependent and seizures, chronic diastolic heart failure, diet-controlled diabetes, GERD and paroxysmal atrial fibrillation not a candidate for anticoagulation. He was brought from [facility] (LTAC facility) with complaint of fever for the past 2 days. Patient has an history of recurrent UTI and aspiration pneumonia.  Patient is nonverbal because of tracheostomy, accompanied by his mother who is the main source of information. Patient just respond by noting his head and follow simple commands. Patient was living in LTAC facility for past 2 years and a skilled nursing home for past 5 years. According to mother he developed fever up to 102 for the past 2 days, appears agitated. She declined any increase in his cough or respiratory secretions. On questions regarding payment patient was pointing to his genital area and one time to right hip. He denies any headache, upper respiratory symptoms, chest pain, shortness of breath, palpitations, nausea or vomiting, diarrhea or constipation, abdominal pain or flank pain.  According to mother Foley was placed because of  the nonhealing decubitus ulcers and [facility], before that patient was using diapers and frequently soiling himself.  ID PMHx.  06/04/16. Had respiratory culture positive with STENOTROPHOMONAS MALTOPHILIA,PSEUDOMONAS AERUGINOSA and  ACINETOBACTER CALCOACETICUS/BAUMANNII COMPLEX. With intermediate resistance to cefepime, levofloxacin and gentamicin and resistant to every other thing.  01/04/17. Respiratory culture positive for SERRATIA MARCESCENS.resistant to cefazolin with  good sensitivity.  02/07/17.positive urine culture with Proteus mirabilis, resistant to Cipro and nitrofurantoin time.  Review of Systems: ROS  As per HPI.  Past Medical History:  Diagnosis Date  . Atrial fibrillation (HCC)   . CHF (congestive heart failure) (HCC)   . Chronic respiratory failure (HCC)   . Diabetes mellitus without complication (HCC)   . Diastolic heart failure (HCC)   . Dysphagia   . GERD (gastroesophageal reflux disease)   . Quadriplegia (HCC)   . Seizures (HCC)     Social History  Substance Use Topics  . Smoking status: Never Smoker  . Smokeless tobacco: Never Used  . Alcohol use No    No family history on file. Allergies  Allergen Reactions  . Ativan [Lorazepam] Other (See Comments)    unknown  . Azithromycin Other (See Comments)    Blisters all over body  . Ceftriaxone Other (See Comments)    Blisters all over body  . Vancomycin Other (See Comments)    Blisters all over body    OBJECTIVE: Blood pressure 132/71, pulse (!) 109, temperature 98.1 F (36.7 C), temperature source Rectal, resp. rate (!) 24, height 5\' 10"  (1.778 m), weight 139 lb 15.9 oz (63.5 kg), SpO2 100 %.  Physical Exam   Vitals:   04/09/17 0915 04/09/17 1030 04/09/17 1100 04/09/17 1200  BP: (!) 113/92 (!) 86/71 132/71   Pulse: (!) 108 (!) 109 (!) 109   Resp: 19 19 (!) 24   Temp:      TempSrc:      SpO2: 99% 95% 100%   Weight:    139 lb 15.9 oz (63.5 kg)  Height:    5\' 10"  (1.778 m)   General: Vital signs reviewed.  Patient was lying down in bed with multiple contractures and muscle wasting, responding to simple commands with repetitive lower extremity movements, dry mucous membranes, in no acute distress and cooperative with exam.  Head: Normocephalic and well-healed craniotomy scar. Neck: Tracheostomy tube in place at upper  tracheostomy, and lower open tracheostomy area with frothy secretions. Cardiovascular: Tachycardia with regular rhythm, no murmurs, gallops, or  rubs. Pulmonary/Chest: Coarse breath sounds at bases, no wheezes, rales, or rhonchi. Abdominal: Soft, non-tender, PEG tube in place, non-distended, BS +, no masses, organomegaly, or guarding present.  Extremities: No lower extremity edema bilaterally,  pulses symmetric and intact bilaterally. No cyanosis or clubbing. Skin: Multiple decubitus pressure ulcers, few well-healed ulcers on his hips. cold extremities.  Lab Results Lab Results  Component Value Date   WBC 15.7 (H) 04/09/2017   HGB 8.1 (L) 04/09/2017   HCT 25.6 (L) 04/09/2017   MCV 84.5 04/09/2017   PLT 346 04/09/2017    Lab Results  Component Value Date   CREATININE 1.26 (H) 04/09/2017   BUN 60 (H) 04/09/2017   NA 136 04/09/2017   K 4.1 04/09/2017   CL 98 (L) 04/09/2017   CO2 30 04/09/2017    Lab Results  Component Value Date   ALT 16 (L) 04/09/2017   AST 28 04/09/2017   ALKPHOS 78 04/09/2017   BILITOT 0.4 04/09/2017     Microbiology: Recent Results (from the  past 240 hour(s))  Respiratory Panel by PCR     Status: None   Collection Time: 04/09/17  9:08 AM  Result Value Ref Range Status   Adenovirus NOT DETECTED NOT DETECTED Final   Coronavirus 229E NOT DETECTED NOT DETECTED Final   Coronavirus HKU1 NOT DETECTED NOT DETECTED Final   Coronavirus NL63 NOT DETECTED NOT DETECTED Final   Coronavirus OC43 NOT DETECTED NOT DETECTED Final   Metapneumovirus NOT DETECTED NOT DETECTED Final   Rhinovirus / Enterovirus NOT DETECTED NOT DETECTED Final   Influenza A NOT DETECTED NOT DETECTED Final   Influenza B NOT DETECTED NOT DETECTED Final   Parainfluenza Virus 1 NOT DETECTED NOT DETECTED Final   Parainfluenza Virus 2 NOT DETECTED NOT DETECTED Final   Parainfluenza Virus 3 NOT DETECTED NOT DETECTED Final   Parainfluenza Virus 4 NOT DETECTED NOT DETECTED Final   Respiratory Syncytial Virus NOT DETECTED NOT DETECTED Final   Bordetella pertussis NOT DETECTED NOT DETECTED Final   Chlamydophila pneumoniae NOT DETECTED NOT  DETECTED Final   Mycoplasma pneumoniae NOT DETECTED NOT DETECTED Final    Arnetha Courser, MD [facility] [phone] pager   [phone] cell 04/09/2017, 12:37 PM

## 2017-04-09 NOTE — ED Notes (Addendum)
Changed pt's gown and linens after pt had BM. Suctioned pt's trach. Replaced left buttock dressing with clean nonadherent dressing.

## 2017-04-09 NOTE — ED Triage Notes (Signed)
Arrives from [facility]. Staff found patient with low blood pressure readings yesterday morning. SBPs in the 70s, at that time they gave bolus of NS. Blood pressure recovered. This morning around 0100 staff found patient to have low SBP again in 70's. Mididrin was given and another bolus was given. Staff then consulted their physician on call who advised patient would need antibiotics. Family requested transfer to [facility] for further treatment. EMS found patient to have SBP in the 80s. Trach, PEG, Foley present prior to arrival. Baseline mental status is acknowledging staff with nod or shake of head to indicate yes/no. DNR paperwork arrived with patient.

## 2017-04-09 NOTE — Progress Notes (Signed)
Pharmacy Antibiotic Note  Dean Graham is a 56 y.o. male admitted on 04/09/2017 with UTI.  Pharmacy has been consulted for unasyn dosing. Pt is afebrile and WBC is elevated at 15.7. Scr is WNL but known hx quadriplegia so may not reflect true renal function. Also Scr is well above baseline.    Plan: Unasyn 3gm IV Q8H F/u renal fxn, C&S, clinical status *May need to broaden coverage depending on response due to history of MDR organisms  Height: 5\' 10"  (177.8 cm) Weight: 139 lb 15.9 oz (63.5 kg) IBW/kg (Calculated) : 73  Temp (24hrs), Avg:98.1 F (36.7 C), Min:98.1 F (36.7 C), Max:98.1 F (36.7 C)   Recent Labs Lab 04/09/17 0443 04/09/17 0452 04/09/17 0919 04/09/17 0928  WBC 15.7*  --   --   --   CREATININE 1.26*  --   --   --   LATICACIDVEN  --  1.49 2.2* 2.20*    Estimated Creatinine Clearance: 57.4 mL/min (A) (by C-G formula based on SCr of 1.26 mg/dL (H)).    Allergies  Allergen Reactions  . Ativan [Lorazepam] Other (See Comments)    unknown  . Azithromycin Other (See Comments)    Blisters all over body  . Ceftriaxone Other (See Comments)    Blisters all over body  . Vancomycin Other (See Comments)    Blisters all over body    Antimicrobials this admission: Unasyn 7/31>> Linezolid x 1 7/31 Aztreonam x 1 7/31  Dose adjustments this admission: N/A  Microbiology results: Pending  Thank you for allowing pharmacy to be a part of this patient's care.  Dean Graham, Drake Leach 04/09/2017 12:42 PM

## 2017-04-09 NOTE — ED Notes (Signed)
Report given to [place], Theola Sequin

## 2017-04-09 NOTE — ED Notes (Signed)
Notified Junious Silk, NP of pt's CBG. Obtained order for D5NS to run instead of NS. Will carry out order

## 2017-04-09 NOTE — ED Notes (Signed)
Pt's mother, Darral Dash would like to be called when pt has a bed. [phone]. If pt's mother cannot be reached, call sister, Scheryl Marten [phone]

## 2017-04-09 NOTE — ED Notes (Signed)
CRITICAL VALUE ALERT  Critical Value:  Lactic Acid 2.2  Date & Time Notied:  04/09/2017 1024   Provider Notified: Junious Silk, NP  Orders Received/Actions taken: Notified and awaiting orders

## 2017-04-09 NOTE — H&P (Signed)
History and Physical    Dean Graham ZOX:[id number] DOB: April 10, 1961 DOA: 04/09/2017   PCP: Hillary Bow, MD   Attending physician: Konrad Dolores  Patient coming from/Resides with: [facility]  Chief Complaint: Worsened baseline chronic hypotension with fevers  HPI: Dean Graham is a 56 y.o. 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 He also has a history of chronic diastolic heart failure, diet controlled diabetes, GERD, atrial fibrillation maintaining sinus rhythm. He has a has a history of recurrent UTI and aspiration pneumonia. Patient was discharged from this facility on 5/4 after an admission for sepsis secondary to Serratia pneumonia resistant to cefazolin, Proteus UTI resistant to Cipro and nitrofurantoin and a prior history of multidrug-resistant pseudomonas and stenotrophomonas pneumonia. Patient was also MRSA PCR positive during that admission. He was treated initially with linezolid and Zosyn and was transitioned to meropenem by ID service noting he is allergic to azithromycin, ceftriaxone and vancomycin. He required full ventilator assistance during that admission but was discharged on a trach collar to [facility].  He was sent to this ER today at family request after 24 hours of fevers between 102 and 102.68F with worsening of baseline chronic hypotension-reported SBP down to the 70s. It was also reported he had increased cough. It was not documented whether he had increased sputum production or change in sputum color. He had been placed on Levaquin 24 hours prior. In the ER his chest x-ray revealed bilateral basilar opacities that could represent infiltration or atelectasis. His lactic acid was normal. White count was elevated at 15,700 with absolute neutrophils 12.1%. His urinalysis was markedly abnormal. Blood cultures were obtained in the ER.  Broad-spectrum antibiotics were initiated in the ER. Of note urine culture was not obtained by ER prior to initiation of antibiotics. Patient was afebrile in the ER with a rectal temp of 98.1, he was mildly tachycardic with an initial blood pressure of 86/66 with an MAP of 73. He was given a total of 3000 mL of saline bolus as well. He was maintaining O2 saturations 100% trach collar and was noted with thick clear secretions around the trach site. He also appeared quite dehydrated with a BUN of 60 and a creatinine of 1.26. In review of his medications and other treatments at [facility] it was noted that patient was on Osmolite tube feeding at 50 mL per hour with free water 40 mL every 6 hours.  ED Course:  Vital Signs: BP 95/71   Pulse (!) 112   Temp 98.1 F (36.7 C) (Rectal)   Resp 18   SpO2 100%  PCXR: As above Lab data: Sodium 136, potassium 4.1, chloride 98, CO2 30, glucose 95, BUN 60, creatinine 1.26, albumin 2.8, LFTs normal, lactic acid 1.49, white count 15,700 with neutrophils 76% and absolute feels 12.1%, hemoglobin 8.1, platelets 346,000, urinalysis markedly abnormal with cloudy appearance, many bacteria, orange color, small leukocytes, positive nitrite, WBCs; blood cultures obtained in the ER Medications and treatments: Normal saline bolus 3 L, Levaquin 750 mg IV 1, is active and 2 g IV 1, Zyvox 6 and a milligram IV 1  Review of Systems:  **Unable to obtain from patient given baseline altered mentation, nonverbal status. History obtained from the chart   Past Medical History:  Diagnosis Date  . Atrial fibrillation (HCC)   . CHF (congestive heart failure) (HCC)   . Chronic respiratory failure (HCC)   . Diabetes mellitus without complication (  HCC)   . Diastolic heart failure (HCC)   . Dysphagia   . GERD (gastroesophageal reflux disease)   . Quadriplegia (HCC)   . Seizures (HCC)     Past Surgical History:  Procedure Laterality Date  . IR GASTROSTOMY TUBE MOD SED  01/11/2017     Social History   Social History  . Marital status: Single    Spouse name: N/A  . Number of children: N/A  . Years of education: N/A   Occupational History  . Not on file.   Social History Main Topics  . Smoking status: Never Smoker  . Smokeless tobacco: Never Used  . Alcohol use No  . Drug use: No  . Sexual activity: No   Other Topics Concern  . Not on file   Social History Narrative  . No narrative on file    Mobility: Bedbound Work history: Disabled since childhood   Allergies  Allergen Reactions  . Ativan [Lorazepam] Other (See Comments)    unknown  . Azithromycin Other (See Comments)    Blisters all over body  . Ceftriaxone Other (See Comments)    Blisters all over body  . Vancomycin Other (See Comments)    Blisters all over body    **Unable to obtain history from patient and likely would not be pertinent to current admission findings and diagnosis  Prior to Admission medications   Medication Sig Start Date End Date Taking? Authorizing Provider  acetaminophen (TYLENOL) 325 MG tablet Place 2 tablets (650 mg total) into feeding tube every 6 (six) hours as needed for mild pain, moderate pain or fever. 06/18/16  Yes Hongalgi, Maximino Greenland, MD  Amino Acids-Protein Hydrolys (FEEDING SUPPLEMENT, PRO-STAT SUGAR FREE 64,) LIQD Take 30 mLs by mouth daily.   Yes [provider]  chlorhexidine (PERIDEX) 0.12 % solution Use as directed 15 mLs in the mouth or throat 2 (two) times daily.   Yes [provider]  diazepam (VALIUM) 5 MG tablet Place 5 mg into feeding tube 2 (two) times daily.   Yes [provider]  Eyelid Cleansers (OCUSOFT LID SCRUB) PADS Apply 1 each topically 2 (two) times daily.   Yes [provider]  famotidine (PEPCID) 20 MG tablet Place 1 tablet (20 mg total) into feeding tube 2 (two) times daily. 06/18/16  Yes Hongalgi, Maximino Greenland, MD  furosemide (LASIX) 20 MG tablet Place 20 mg into feeding tube daily.   Yes [provider]  Lacosamide (VIMPAT) 100 MG TABS Place 1 tablet (100 mg total) into feeding tube 2 (two) times daily. 06/18/16  Yes Hongalgi, Maximino Greenland, MD  levETIRAcetam (KEPPRA) 100 MG/ML solution Take 10 mLs (1,000 mg total) by mouth 2 (two) times daily. 01/11/17 04/09/17 Yes Amin, Ankit Chirag, MD  levofloxacin (LEVAQUIN) 750 MG tablet Take 750 mg by mouth daily.   Yes [provider]  midodrine (PROAMATINE) 10 MG tablet Place 10 mg into feeding tube every 8 (eight) hours as needed (for blood pressure lower than 90).  05/25/16  Yes [provider]  Multiple Vitamin (MULTIVITAMIN WITH MINERALS) TABS tablet Place 1 tablet into feeding tube daily. 06/18/16  Yes Hongalgi, Maximino Greenland, MD  phenazopyridine (PYRIDIUM) 200 MG tablet Take 200 mg by mouth 2 (two) times daily.   Yes [provider]  saccharomyces boulardii (FLORASTOR) 250 MG capsule Take 1 capsule (250 mg total) by mouth 2 (two) times daily. Via Tube. 06/18/16  Yes Hongalgi, Maximino Greenland, MD  traMADol (ULTRAM) 50 MG tablet Take 50  mg by mouth 2 (two) times daily.   Yes [provider]  lacosamide (VIMPAT) 10 MG/ML oral solution Take 10 mLs (100 mg total) by mouth 2 (two) times daily. Patient not taking: Reported on 04/09/2017 01/11/17 04/09/17  Dimple Nanas, MD  Nutritional Supplements (FEEDING SUPPLEMENT, VITAL AF 1.2 CAL,) LIQD Place 1,000 mLs into feeding tube continuous. 1,000 mL, Per Tube, at 70 mL/hr, Continuous Patient not taking: Reported on 04/09/2017 06/18/16   Elease Etienne, MD  vitamin C (ASCORBIC ACID) 500 MG tablet Place 1 tablet (500 mg total) into feeding tube daily. Patient not taking: Reported on 04/09/2017 06/18/16   Elease Etienne, MD  Water For Irrigation, Sterile (FREE WATER) SOLN Place 200 mLs into feeding tube every 8 (eight) hours. Patient not taking: Reported on 04/09/2017 06/18/16   Elease Etienne, MD    Physical Exam: Vitals:   04/09/17 0615 04/09/17 0630 04/09/17 0645 04/09/17 0745    BP: (!) 78/42 (!) 81/66 94/74 95/71   Pulse:  (!) 114 (!) 114 (!) 112  Resp: (!) 24 (!) 25 (!) 21 18  Temp:      TempSrc:      SpO2:  97% 94% 100%      Constitutional: NAD, Mildly restless noting patient rhythm medically moving arms and legs, appears to be physically comfortable Eyes: PERRL, lids and conjunctivae normal-sclera mildly injected ENMT: Mucous membranes are somewhat dry. Posterior pharynx clear of any exudate or lesions. Poor dentition.  Neck: normal, supple, no masses, no thyromegaly Respiratory: Coarse to auscultation bilaterally with scattered expiratory rhonchi. Tracheostomy tube in place attached to trach collar. Large amount of clear sputum noted on dressing. Mildly tachypneic but does not have increased work of breathing. Strong cough detected. Cardiovascular: Regular somewhat tachycardic rate with underlying sinus rhythm, no murmurs / rubs / gallops. No extremity edema. 2+ pedal pulses. No carotid bruits. Hands and feet are cool to touch with slightly diminished capillary refill. Abdomen: no tenderness, no masses palpated. No hepatosplenomegaly. Bowel sounds positive. PEG tube in place and clamped-site unremarkable. Genitourinary: Foley catheter in place draining bright dark yellow urine to bedside bag. Insertion site with noted urethral excoriation with thick yellow drainage at site. Musculoskeletal: no clubbing / cyanosis. No joint deformity upper and lower extremities. Appears to have a degree of bilateral upper and lower extremity contractures. Abnormal muscle tone at baseline secondary to history of quadriplegia.  Skin: no rashes, lesions, ulcers except as documented above. No induration Neurologic: Unable to accurately test cranial nerves due to patient's inability to participate. Sensation appears to be intact in the upper extremities, DTRs are not tested. Strength was not tested due to patient's inability to participate. Patient has rhythmic activity of both upper and  lower extremities but appears to be voluntary in nature noting when patient calmed physical touch rhythmic activity declined and at times ceased. Patient does make eye contact and attempts to verbally communicate but is essentially nonverbal. Psychiatric: Unable to test accurately secondary to patient's inability to participate consistently or have consistent verbal response.  Labs on Admission: I have personally reviewed following labs and imaging studies  CBC:  Recent Labs Lab 04/09/17 0443  WBC 15.7*  NEUTROABS 12.1*  HGB 8.1*  HCT 25.6*  MCV 84.5  PLT 346   Basic Metabolic Panel:  Recent Labs Lab 04/09/17 0443  NA 136  K 4.1  CL 98*  CO2 30  GLUCOSE 95  BUN 60*  CREATININE 1.26*  CALCIUM 8.8*   GFR: CrCl  cannot be calculated (Unknown ideal weight.). Liver Function Tests:  Recent Labs Lab 04/09/17 0443  AST 28  ALT 16*  ALKPHOS 78  BILITOT 0.4  PROT 6.7  ALBUMIN 2.8*   No results for input(s): LIPASE, AMYLASE in the last 168 hours. No results for input(s): AMMONIA in the last 168 hours. Coagulation Profile: No results for input(s): INR, PROTIME in the last 168 hours. Cardiac Enzymes: No results for input(s): CKTOTAL, CKMB, CKMBINDEX, TROPONINI in the last 168 hours. BNP (last 3 results) No results for input(s): PROBNP in the last 8760 hours. HbA1C: No results for input(s): HGBA1C in the last 72 hours. CBG: No results for input(s): GLUCAP in the last 168 hours. Lipid Profile: No results for input(s): CHOL, HDL, LDLCALC, TRIG, CHOLHDL, LDLDIRECT in the last 72 hours. Thyroid Function Tests: No results for input(s): TSH, T4TOTAL, FREET4, T3FREE, THYROIDAB in the last 72 hours. Anemia Panel: No results for input(s): VITAMINB12, FOLATE, FERRITIN, TIBC, IRON, RETICCTPCT in the last 72 hours. Urine analysis:    Component Value Date/Time   COLORURINE ORANGE (A) 04/09/2017 0450   APPEARANCEUR CLOUDY (A) 04/09/2017 0450   LABSPEC 1.010 04/09/2017 0450    PHURINE 5.0 04/09/2017 0450   GLUCOSEU NEGATIVE 04/09/2017 0450   HGBUR NEGATIVE 04/09/2017 0450   BILIRUBINUR NEGATIVE 04/09/2017 0450   KETONESUR NEGATIVE 04/09/2017 0450   PROTEINUR NEGATIVE 04/09/2017 0450   NITRITE POSITIVE (A) 04/09/2017 0450   LEUKOCYTESUR SMALL (A) 04/09/2017 0450   Sepsis Labs: @LABRCNTIP (procalcitonin:4,lacticidven:4) )No results found for this or any previous visit (from the past 240 hour(s)).   Radiological Exams on Admission: Dg Chest Portable 1 View  Result Date: 04/09/2017 CLINICAL DATA:  Fever and shortness of breath tonight. EXAM: PORTABLE CHEST 1 VIEW COMPARISON:  02/07/2017 FINDINGS: Tracheostomy tube tip measures 2 cm above the carina. No change in position since previous study. Cardiac enlargement without vascular congestion. Infiltration or atelectasis in both lung bases are more prominent than on the previous study but this could be due to differences in inspiration and technique. Patchy areas of increased density in the right mid lung may represent old contrast or calcifications. No blunting of costophrenic angles. No pneumothorax. Catheter projected over the right neck and right chest probably represents on all ventricular peritoneal shunt. Calcification in the tract. IMPRESSION: Endotracheal tube appears in satisfactory position. Bilateral basilar opacities may represent infiltration or atelectasis. These changes appear more prominent than on previous study, possibly related to differences in technique. Electronically Signed   By: Burman Nieves M.D.   On: 04/09/2017 04:53    EKG: (Independently reviewed) Sinus tachycardia with ventricular rate 110 bpm, QTC 477 ms, no acute ischemic changes  Assessment/Plan Principal Problem:   Sepsis  -Patient presents with early sepsis physiology and normal serum lactate -Possible sources include urinary tract and/or pulmonary etiology -Continue broad-spectrum empiric antibiotics-given history of MDR organisms  will need ID assistance as well as approval to continue use of Zyvox vs utilization of other alternative agents given history of penicillin allergy and prior MRSA PCR positive state **ID has evaluated patient and discontinued Zyvox and started Unasyn; empiric Levaquin and Azactam continued until definitive source of infection clarified -Follow up on blood cultures and obtain urine culture (collected) -Sputum culture (collected) -Resp viral panel (collected) -Urinary strep (neg) -Continue IV for hydration at 125/hr -Repeat lactic acid (2.20)- elevated so will cycle -Procalcitonin (3.12) -HIV per protocol  Active Problems:   Abnormal urinalysis/ Chronic indwelling Foley catheter/ Neurogenic bladder -Likely secondary to chronic indwelling Foley  but given fever need to exclude recurrent infection -Urine culture and antibiotics as above -Continue preadmission Pyridium **ID spoke with mother reports patient can actually void independently so catheter discontinued and antibiotics adjusted (see above)    Tracheostomy in place/Chronic respiratory failure with hypoxia/history of aspiration pneumonitis -Chest x-ray inconclusive regarding pneumonia process and sputum currently clear -Empiric treatment of suspected pneumonia process -Sputum culture -Continue supportive care with trach collar and/or prn ventilator if indicated    Acute kidney injury  -Baseline: <5 and 0.57 -Current: 60 and 1.26 -At time of discharge patient was prescribed 200 mL of free water every 8 hours -Current free water prescription documented at 40 mL every 6 hours-?? Change secondary to history of chronic diastolic heart failure?? -Continue IV fluids at above -Await nutritionist's recommendation for free water requirements    Chronic diastolic heart failure  -Appears compensated and hypovolemic -We'll hold Lasix -Given underlying chronic low blood pressure would only give Lasix for weight gain noting patient also  hypoalbuminemic and this could certainly contribute to any edema and or third spacing of fluids    Diabetes mellitus type 2, diet-controlled  -HgbA1c -SSI ** 1605: CBGs trending down and no definitive TF orders so will change IVFs to D5NS @125 /hr    Chronic hypotension -At time of discharge midodrine was scheduled every 8 hours and according to medication reconciliation from [facility] this was changed to every 8 hours prn -Given suboptimal blood pressures at presentation will change back to scheduled dosing -Current MAP > 65    Dysphagia/PEG tube -Nutrition consulted regarding tube feeding requirements    GERD (gastroesophageal reflux disease) -Continue Pepcid per tube    Atrial fibrillation, currently in sinus rhythm -Was discharged on amiodarone in May but is no longer on this medication -Continue to monitor -Not a candidate for chronic anticoagulation    Seizure disorder/ history of TBI in childhood with H/O quadriplegia -Was started on Vimpat during previous hospitalization which will be continued -Continue Keppra -Has what appears to be voluntary rhythmic activity involving the upper and lower extremities -Check Keppra level- no test available re: Vimpat -Neuro checks every 2 hours      DVT prophylaxis: Lovenox Code Status: DO NOT RESUSCITATE although given indwelling tracheostomy in the past family has been okay with prn ventilator assistance  Family Communication: No family at bedside  Disposition Plan: Return to LTAC level of care Consults called: Infectious disease/message left-office returned call and to contact on-call MD    Dean Graham L. ANP-BC [facility] Pager [phone]   If 7PM-7AM, please contact night-coverage [email or web address] Password [email or web address]  04/09/2017, 8:11 AM

## 2017-04-09 NOTE — ED Notes (Signed)
IV team at bedside 

## 2017-04-09 NOTE — ED Notes (Signed)
CBG 83 

## 2017-04-09 NOTE — Plan of Care (Signed)
Discussed patient care with Dr. Nicanor Alcon. Mr. Dean Graham is a 56 year old male with pmh TBI during childhood, chronic respiratory failure s/p trach and PEG, DM type II, and diastolic CHF; who presents as a transfer from [facility] for suspected sepsis. Patient was noted to be hypotensive yesterday for which he responded to IV fluids and midodrine.   VS: Temperature 98.39F, pulse 109, respirations 18, blood pressure initially as well as 79/61. Labs: WBC 15.7, hemoglobin 8.1, BUN 60, creatinine 1.26, lactic acid 1.49. UA positive and chest x-ray showing signs of bilateral pneumonia. Review of records shows MDR organisms of the sputum.  Patient allergic to ceftriaxone and vancomycin given linezolid, aztreonam, and Levaquin and will need ID consult. Ordered to be given 2 L normal saline IV fluids for blood pressures as well as midodrine. Admitted as an inpatient to stepdown.

## 2017-04-09 NOTE — ED Notes (Signed)
Removed pt's foley catheter per order and placed condom catheter. Repositioned pt and gave pt new warm blanket

## 2017-04-09 NOTE — ED Notes (Signed)
Changed pt's linens after pt had BM

## 2017-04-09 NOTE — ED Provider Notes (Signed)
[facility]-EMERGENCY DEPT Provider Note   CSN: [id number] Arrival date & time: 04/09/17  0407     History   Chief Complaint No chief complaint on file.   HPI Dean Graham is a 56 y.o. male.  The history is provided by the EMS personnel. The history is limited by the condition of the patient (level 5 condition of the patient). No language interpreter was used.  Fever   This is a recurrent problem. The current episode started 12 to 24 hours ago. The problem occurs constantly. The problem has not changed since onset.The maximum temperature noted was 102 to 102.9 F. The temperature was taken using an oral thermometer. Associated symptoms include cough. Pertinent negatives include no chest pain. Treatments tried: levaquin for uti. The treatment provided no relief.    Past Medical History:  Diagnosis Date  . Atrial fibrillation (HCC)   . CHF (congestive heart failure) (HCC)   . Chronic respiratory failure (HCC)   . Diabetes mellitus without complication (HCC)   . Diastolic heart failure (HCC)   . Dysphagia   . GERD (gastroesophageal reflux disease)   . Quadriplegia (HCC)   . Seizures [facility])     Patient Active Problem List   Diagnosis Date Noted  . Acute hypoxemic respiratory failure (HCC)   . Ventilator dependent (HCC)   . 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 Fever   . Leukocytosis   . Aspiration pneumonia (HCC) 01/04/2017  . Anasarca   . Bleeding   . Tracheostomy care (HCC)   . Hypoalbuminemia   . Cough with frothy sputum   . VAP (ventilator-associated pneumonia) (HCC)   . Carbapenem-resistant Acinetobacter baumannii infection   . History of infection by MDR Stenotrophomonas maltophilia   . History of MDR Pseudomonas aeruginosa infection   . Chronic post traumatic encephalopathy   . Acute encephalopathy   . Hypokalemia   . Hypomagnesemia   . Urinary tract infection, site not specified   . Acute on chronic respiratory failure with  hypoxia (HCC)   . Dysphagia   . Decubitus ulcer of sacral area   . Encounter for wound care   . Protein-calorie malnutrition, severe (HCC)   . Palliative care encounter   . Goals of care, counseling/discussion   . Pressure injury of skin 05/31/2016  . Quadriplegia (HCC) 05/31/2016  . Atrial fibrillation (HCC) 05/31/2016  . Diabetes mellitus without complication (HCC) 05/31/2016  . Chronic respiratory failure (HCC) 05/31/2016  . 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 Septic shock (HCC) 05/29/2016  . HCAP (healthcare-associated pneumonia)     Past Surgical History:  Procedure Laterality Date  . IR GASTROSTOMY TUBE MOD SED  01/11/2017       Home Medications    Prior to Admission medications   Medication Sig Start Date End Date Taking? Authorizing Provider  acetaminophen (TYLENOL) 325 MG tablet Place 2 tablets (650 mg total) into feeding tube every 6 (six) hours as needed for mild pain, moderate pain or fever. 06/18/16   Hongalgi, Maximino Greenland, MD  albuterol (PROVENTIL) (2.5 MG/3ML) 0.083% nebulizer solution Take 2.5 mg by nebulization every 6 (six) hours as needed for wheezing or shortness of breath.    [provider]  amiodarone (PACERONE) 100 MG tablet Place 100 mg into feeding tube daily. 08/26/15   [provider]  chlorhexidine (PERIDEX) 0.12 % solution Use as directed 15 mLs in the mouth or throat 2 (two) times daily.    [provider]  Eyelid  Cleansers (OCUSOFT LID SCRUB) PADS Apply 1 each topically 2 (two) times daily.    [provider]  famotidine (PEPCID) 20 MG tablet Place 1 tablet (20 mg total) into feeding tube 2 (two) times daily. Patient taking differently: Place 20 mg into feeding tube at bedtime.  06/18/16   Hongalgi, Maximino Greenland, MD  furosemide (LASIX) 40 MG tablet Place 40 mg into feeding tube daily.    [provider]  lacosamide (VIMPAT) 10 MG/ML oral solution Take 10 mLs (100 mg total) by mouth 2 (two) times daily. 01/11/17  02/10/17  Amin, Loura Halt, MD  Lacosamide (VIMPAT) 100 MG TABS Place 1 tablet (100 mg total) into feeding tube 2 (two) times daily. Patient not taking: Reported on 01/04/2017 06/18/16   Elease Etienne, MD  levETIRAcetam (KEPPRA) 100 MG/ML solution Take 10 mLs (1,000 mg total) by mouth 2 (two) times daily. 01/11/17 02/10/17  Amin, Loura Halt, MD  lidocaine (XYLOCAINE) 2 % solution Use as directed 20 mLs in the mouth or throat as needed for mouth pain.    [provider]  midodrine (PROAMATINE) 10 MG tablet Place 10 mg into feeding tube every 8 (eight) hours. 05/25/16   [provider]  Multiple Vitamin (MULTIVITAMIN WITH MINERALS) TABS tablet Place 1 tablet into feeding tube daily. 06/18/16   Hongalgi, Maximino Greenland, MD  Nutritional Supplements (FEEDING SUPPLEMENT, VITAL AF 1.2 CAL,) LIQD Place 1,000 mLs into feeding tube continuous. 1,000 mL, Per Tube, at 70 mL/hr, Continuous 06/18/16   Hongalgi, Maximino Greenland, MD  phenazopyridine (PYRIDIUM) 200 MG tablet Take 200 mg by mouth 2 (two) times daily.    [provider]  saccharomyces boulardii (FLORASTOR) 250 MG capsule Take 1 capsule (250 mg total) by mouth 2 (two) times daily. Via Tube. 06/18/16   Hongalgi, Maximino Greenland, MD  traMADol (ULTRAM) 50 MG tablet Take 50 mg by mouth 2 (two) times daily.    [provider]  vitamin C (ASCORBIC ACID) 500 MG tablet Place 1 tablet (500 mg total) into feeding tube daily. Patient taking differently: Place 500 mg into feeding tube 2 (two) times daily.  06/18/16   Hongalgi, Maximino Greenland, MD  Water For Irrigation, Sterile (FREE WATER) SOLN Place 200 mLs into feeding tube every 8 (eight) hours. 06/18/16   Hongalgi, Maximino Greenland, MD    Family History No family history on file.  Social History Social History  Substance Use Topics  . Smoking status: Never Smoker  . Smokeless tobacco: Never Used  . Alcohol use No     Allergies   Ativan [lorazepam]; Azithromycin; Ceftriaxone; and Vancomycin   Review of  Systems Review of Systems  Unable to perform ROS: Acuity of condition  Constitutional: Positive for fever.  Respiratory: Positive for cough.   Cardiovascular: Negative for chest pain.     Physical Exam Updated Vital Signs There were no vitals taken for this visit.  Physical Exam  Constitutional: He appears well-developed.  HENT:  Right Ear: External ear normal.  Left Ear: External ear normal.  Mouth/Throat: No oropharyngeal exudate.  Eyes: Pupils are equal, round, and reactive to light.  chemosis  Neck: Normal range of motion. Neck supple.  Cardiovascular: Regular rhythm and intact distal pulses.  Tachycardia present.   Pulmonary/Chest: No stridor. He has no wheezes.  Abdominal: Soft. Bowel sounds are normal. He exhibits no mass. There is no tenderness. There is no rebound and no guarding.  Musculoskeletal: He exhibits no edema.  Neurological: GCS eye subscore is 1. GCS  verbal subscore is 1. GCS motor subscore is 4.  Skin: Skin is warm and dry. Capillary refill takes less than 2 seconds.     ED Treatments / Results  Labs (all labs ordered are listed, but only abnormal results are displayed) Results for orders placed or performed during the hospital encounter of 02/07/17  Urine culture  Result Value Ref Range   Specimen Description URINE, RANDOM    Special Requests NONE    Culture >=100,000 COLONIES/mL PROTEUS MIRABILIS (A)    Report Status 02/09/2017 FINAL    Organism ID, Bacteria PROTEUS MIRABILIS (A)       Susceptibility   Proteus mirabilis - MIC*    AMPICILLIN <=2 SENSITIVE Sensitive     CEFAZOLIN 8 SENSITIVE Sensitive     CEFTRIAXONE <=1 SENSITIVE Sensitive     CIPROFLOXACIN >=4 RESISTANT Resistant     GENTAMICIN <=1 SENSITIVE Sensitive     IMIPENEM 2 SENSITIVE Sensitive     NITROFURANTOIN 128 RESISTANT Resistant     TRIMETH/SULFA <=20 SENSITIVE Sensitive     AMPICILLIN/SULBACTAM <=2 SENSITIVE Sensitive     PIP/TAZO <=4 SENSITIVE Sensitive     * >=100,000  COLONIES/mL PROTEUS MIRABILIS  Culture, blood (Routine X 2) w Reflex to ID Panel  Result Value Ref Range   Specimen Description BLOOD LEFT FOOT    Special Requests      IN PEDIATRIC BOTTLE Blood Culture results may not be optimal due to an excessive volume of blood received in culture bottles   Culture NO GROWTH 5 DAYS    Report Status 02/12/2017 FINAL   Culture, blood (Routine X 2) w Reflex to ID Panel  Result Value Ref Range   Specimen Description BLOOD RIGHT FOOT    Special Requests IN PEDIATRIC BOTTLE Blood Culture adequate volume    Culture NO GROWTH 5 DAYS    Report Status 02/12/2017 FINAL   Urinalysis, Routine w reflex microscopic  Result Value Ref Range   Color, Urine AMBER (A) YELLOW   APPearance HAZY (A) CLEAR   Specific Gravity, Urine 1.017 1.005 - 1.030   pH 7.0 5.0 - 8.0   Glucose, UA NEGATIVE NEGATIVE mg/dL   Hgb urine dipstick SMALL (A) NEGATIVE   Bilirubin Urine NEGATIVE NEGATIVE   Ketones, ur NEGATIVE NEGATIVE mg/dL   Protein, ur >=161 (A) NEGATIVE mg/dL   Nitrite POSITIVE (A) NEGATIVE   Leukocytes, UA MODERATE (A) NEGATIVE   RBC / HPF TOO NUMEROUS TO COUNT 0 - 5 RBC/hpf   WBC, UA TOO NUMEROUS TO COUNT 0 - 5 WBC/hpf   Bacteria, UA RARE (A) NONE SEEN   Squamous Epithelial / LPF 0-5 (A) NONE SEEN   Trans Epithel, UA 1    Mucous PRESENT   Comprehensive metabolic panel  Result Value Ref Range   Sodium 147 (H) 135 - 145 mmol/L   Potassium 4.1 3.5 - 5.1 mmol/L   Chloride 113 (H) 101 - 111 mmol/L   CO2 29 22 - 32 mmol/L   Glucose, Bld 84 65 - 99 mg/dL   BUN 31 (H) 6 - 20 mg/dL   Creatinine, Ser 0.96 0.61 - 1.24 mg/dL   Calcium 8.5 (L) 8.9 - 10.3 mg/dL   Total Protein 6.5 6.5 - 8.1 g/dL   Albumin 2.4 (L) 3.5 - 5.0 g/dL   AST 36 15 - 41 U/L   ALT 45 17 - 63 U/L   Alkaline Phosphatase 101 38 - 126 U/L   Total Bilirubin 0.5 0.3 - 1.2 mg/dL  GFR calc non Af Amer >60 >60 mL/min   GFR calc Af Amer >60 >60 mL/min   Anion gap 5 5 - 15  I-Stat CG4 Lactic Acid, ED    Result Value Ref Range   Lactic Acid, Venous 1.75 0.5 - 1.9 mmol/L   No results found.   MDM Reviewed: previous chart, nursing note and vitals Reviewed previous: labs and x-ray Interpretation: labs, ECG and x-ray (elevated white count normal lactate, PNA by me on cxr) Total time providing critical care: 75-105 minutes. This excludes time spent performing separately reportable procedures and services. Consults: admitting MD  CRITICAL CARE Performed by: Jasmine Awe Total critical care time: 90 minutes Critical care time was exclusive of separately billable procedures and treating other patients. Critical care was necessary to treat or prevent imminent or life-threatening deterioration. Critical care was time spent personally by me on the following activities: development of treatment plan with patient and/or surrogate as well as nursing, discussions with consultants, evaluation of patient's response to treatment, examination of patient, obtaining history from patient or surrogate, ordering and performing treatments and interventions, ordering and review of laboratory studies, ordering and review of radiographic studies, pulse oximetry and re-evaluation of patient's condition.  Procedures Procedures (including critical care time)  Medications Ordered in ED Medications  sodium chloride 0.9 % bolus 1,000 mL (1,000 mLs Intravenous New Bag/Given 04/09/17 0446)    And  sodium chloride 0.9 % bolus 1,000 mL (1,000 mLs Intravenous New Bag/Given 04/09/17 0446)  levofloxacin (LEVAQUIN) IVPB 750 mg (not administered)  linezolid (ZYVOX) IVPB 600 mg (600 mg Intravenous New Bag/Given 04/09/17 0452)  aztreonam (AZACTAM) 2 g in dextrose 5 % 50 mL IVPB (2 g Intravenous New Bag/Given 04/09/17 0452)      Final Clinical Impressions(s) / ED Diagnoses  Sepsis:  Will admit to step down    Khyre Germond, MD 04/09/17 1610

## 2017-04-10 DIAGNOSIS — Z8669 Personal history of other diseases of the nervous system and sense organs: Secondary | ICD-10-CM

## 2017-04-10 DIAGNOSIS — I9589 Other hypotension: Secondary | ICD-10-CM

## 2017-04-10 LAB — GLUCOSE, CAPILLARY
GLUCOSE-CAPILLARY: 101 mg/dL — AB (ref 65–99)
GLUCOSE-CAPILLARY: 95 mg/dL (ref 65–99)
Glucose-Capillary: 81 mg/dL (ref 65–99)
Glucose-Capillary: 88 mg/dL (ref 65–99)
Glucose-Capillary: 98 mg/dL (ref 65–99)
Glucose-Capillary: 113 mg/dL — ABNORMAL HIGH (ref 65–99)

## 2017-04-10 LAB — PHOSPHORUS: Phosphorus: 2.4 mg/dL — ABNORMAL LOW (ref 2.5–4.6)

## 2017-04-10 LAB — COMPREHENSIVE METABOLIC PANEL
ALBUMIN: 2.7 g/dL — AB (ref 3.5–5.0)
ALK PHOS: 66 U/L (ref 38–126)
ALT: 16 U/L — AB (ref 17–63)
AST: 29 U/L (ref 15–41)
Anion gap: 10 (ref 5–15)
BILIRUBIN TOTAL: 0.5 mg/dL (ref 0.3–1.2)
BUN: 31 mg/dL — AB (ref 6–20)
CALCIUM: 8.5 mg/dL — AB (ref 8.9–10.3)
CO2: 22 mmol/L (ref 22–32)
Chloride: 109 mmol/L (ref 101–111)
Creatinine, Ser: 0.76 mg/dL (ref 0.61–1.24)
GFR calc Af Amer: >60 mL/min (ref >60)
GFR calc non Af Amer: >60 mL/min (ref >60)
GLUCOSE: 88 mg/dL (ref 65–99)
Potassium: 3.7 mmol/L (ref 3.5–5.1)
Sodium: 141 mmol/L (ref 135–145)
TOTAL PROTEIN: 6 g/dL — AB (ref 6.5–8.1)

## 2017-04-10 LAB — MRSA CULTURE: CULTURE: NOT DETECTED

## 2017-04-10 LAB — MAGNESIUM: Magnesium: 1.8 mg/dL (ref 1.7–2.4)

## 2017-04-10 LAB — HEMOGLOBIN A1C
HEMOGLOBIN A1C: 4.5 % — AB (ref 4.8–5.6)
MEAN PLASMA GLUCOSE: 82 mg/dL

## 2017-04-10 LAB — LEVETIRACETAM LEVEL: Levetiracetam Lvl: 88.2 ug/mL — ABNORMAL HIGH (ref 10.0–40.0)

## 2017-04-10 LAB — CBC
HCT: 25.6 % — ABNORMAL LOW (ref 39.0–52.0)
Hemoglobin: 8.1 g/dL — ABNORMAL LOW (ref 13.0–17.0)
MCH: 27 pg (ref 26.0–34.0)
MCHC: 31.6 g/dL (ref 30.0–36.0)
MCV: 85.3 fL (ref 78.0–100.0)
Platelets: 260 10*3/uL (ref 150–400)
RBC: 3 MIL/uL — ABNORMAL LOW (ref 4.22–5.81)
RDW: 15.8 % — AB (ref 11.5–15.5)
WBC: 7.2 10*3/uL (ref 4.0–10.5)

## 2017-04-10 LAB — MRSA PCR SCREENING: MRSA BY PCR: INVALID — AB

## 2017-04-10 MED ORDER — JEVITY 1.2 CAL PO LIQD: 1000.0000 mL | ORAL | Status: DC

## 2017-04-10 MED ORDER — SODIUM CHLORIDE 0.9 % IV SOLN
3.0000 g | Freq: Four times a day (QID) | INTRAVENOUS | Status: AC
  Administered 2017-04-10 – 2017-04-13 (×14): 3 g via INTRAVENOUS
  Filled 2017-04-10 (×14): qty 3

## 2017-04-10 MED ORDER — LEVETIRACETAM 100 MG/ML PO SOLN
1000.0000 mg | Freq: Two times a day (BID) | ORAL | Status: DC
  Administered 2017-04-10 – 2017-04-15 (×10): 1000 mg
  Filled 2017-04-10 (×11): qty 10

## 2017-04-10 MED ORDER — PHENAZOPYRIDINE HCL 200 MG PO TABS
200.0000 mg | ORAL_TABLET | Freq: Two times a day (BID) | ORAL | Status: DC
  Administered 2017-04-10 – 2017-04-14 (×8): 200 mg
  Filled 2017-04-10 (×8): qty 1

## 2017-04-10 MED ORDER — OSMOLITE 1.5 CAL PO LIQD
1000.0000 mL | ORAL | Status: DC
  Administered 2017-04-10 – 2017-04-14 (×5): 1000 mL
  Filled 2017-04-10 (×9): qty 1000

## 2017-04-10 MED ORDER — ACETAMINOPHEN 650 MG RE SUPP: 650.0000 mg | Freq: Four times a day (QID) | RECTAL | Status: DC | PRN

## 2017-04-10 MED ORDER — SACCHAROMYCES BOULARDII 250 MG PO CAPS
250.0000 mg | ORAL_CAPSULE | Freq: Two times a day (BID) | ORAL | Status: DC
  Administered 2017-04-10 – 2017-04-15 (×10): 250 mg
  Filled 2017-04-10 (×10): qty 1

## 2017-04-10 MED ORDER — ACETAMINOPHEN 325 MG PO TABS: 650.0000 mg | ORAL_TABLET | Freq: Four times a day (QID) | ORAL | Status: DC | PRN

## 2017-04-10 MED ORDER — MIDODRINE HCL 5 MG PO TABS
10.0000 mg | ORAL_TABLET | Freq: Three times a day (TID) | ORAL | Status: DC
  Administered 2017-04-11 – 2017-04-15 (×14): 10 mg
  Filled 2017-04-10 (×14): qty 2

## 2017-04-10 MED ORDER — TRAMADOL HCL 50 MG PO TABS
50.0000 mg | ORAL_TABLET | Freq: Two times a day (BID) | ORAL | Status: DC
  Administered 2017-04-10 – 2017-04-15 (×10): 50 mg
  Filled 2017-04-10 (×10): qty 1

## 2017-04-10 MED ORDER — ONDANSETRON HCL 4 MG/2ML IJ SOLN: 4.0000 mg | Freq: Four times a day (QID) | INTRAMUSCULAR | Status: DC | PRN

## 2017-04-10 MED ORDER — ONDANSETRON HCL 4 MG PO TABS: 4.0000 mg | ORAL_TABLET | Freq: Four times a day (QID) | ORAL | Status: DC | PRN

## 2017-04-10 MED ORDER — PRO-STAT SUGAR FREE PO LIQD
60.0000 mL | Freq: Every day | ORAL | Status: DC
  Administered 2017-04-10 – 2017-04-15 (×6): 60 mL
  Filled 2017-04-10 (×6): qty 60

## 2017-04-10 NOTE — Progress Notes (Signed)
Nutrition Follow-up  DOCUMENTATION CODES:   Not applicable  INTERVENTION:   -Initiate Osmolite 1.5 @ 20 ml/hr via PEG and increase by 10 ml every 4 hours to goal rate of 50 ml/hr.   60 ml Prostat daily.    Tube feeding regimen provides 2000 kcal (100% of needs), 105 grams of protein, and 914 ml of H2O.   NUTRITION DIAGNOSIS:   Inadequate oral intake related to inability to eat as evidenced by NPO status.  GOAL:   Patient will meet greater than or equal to 90% of their needs  MONITOR:   Labs, Weight trends, Skin, I & O's  REASON FOR ASSESSMENT:   Malnutrition Screening Tool, Consult Enteral/tube feeding initiation and management  ASSESSMENT:   Dean Graham is a 56 y.o. 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 He also has a history of chronic diastolic heart failure, diet controlled diabetes, GERD, atrial fibrillation maintaining sinus rhythm. He has a has a history of recurrent UTI and aspiration pneumonia.  Pt admitted with sepsis.   Pt on trach collar. Pt able to mouth words and acknowledge RD presence, however, difficulty to understand.   Reviewed wt hx, which reveals a 4.5% wt loss over the past 3 months, which is not significant for time frame.   Nutrition-Focused physical exam completed. Findings are mild to moderate fat depletion, moderate to severe muscle depletion, and no edema. Exam technically difficult, given pt positioning. Suspect fat and muscle wasting partially related to quadriplegia.   Pt PEG dependent. Reviewed records from [facility]- PTA TF regimen Osmolite 1.5 at 50/hr plus 40cc H2O q 6 hrs, 30 ml Prostat daily, and 1 scoop Beneprotein 4 times daily. Complete regimen provides 2000 kcals, 114 grams protein, and 1074 ml free water daily, meeting 100% iof estimated kcals and protein.   Labs reviewed: CBGS: 81-101.  Diet Order:   Diet NPO time specified Except for: Other (See Comments)  Skin:  Reviewed, no issues  Last BM:  PTA  Height:   Ht Readings from Last 1 Encounters:  04/09/17 5\' 10"  (1.778 m)    Weight:   Wt Readings from Last 1 Encounters:  04/10/17 134 lb (60.8 kg)    Ideal Body Weight:  67.9 kg  BMI:  Body mass index is 19.23 kg/m.  Estimated Nutritional Needs:   Kcal:  1800-2000  Protein:  100-115 grams  Fluid:  1.8-2.0 L  EDUCATION NEEDS:   No education needs identified at this time  Mersadez Linden A. Mayford Knife, RD, LDN, CDE Pager: [phone] After hours Pager: [phone]

## 2017-04-10 NOTE — Progress Notes (Signed)
[place] TEAM 1 - Stepdown/ICU TEAM  Dean Graham  [facility]:[id number] DOB: [birth year] DOA: 04/09/2017 PCP: Hillary Bow, MD    Brief Narrative:  56 y.o. male who presented with multiple complex chronic medical conditions in sepsis w/ fever to 102.9, WBC of 15.7, and systolics as low as 56mm Hg.    Subjective: Non-communicative.  Appears to be resting comfortably in bed.  Does not wake to voice of gentle touch/exam.  No evidence of resp distress or uncontrolled pain.    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 ID following and directing abx choice   Acute kidney injury Much improved already with volume expansion - follow trend   History of traumatic brain injury with quadriplegia  ?Neurogenic bladder - chronic indwelling Foley catheter Foley has been removed - watch for retention - track Is/Os   Tracheostomy dependence/chronic hypoxic respiratory failure Stable at present   PEG tube dependent PEG insertion site is quite low on the abdom - insertion clean and dry - cont tube feeding   History of recurrent aspiration pneumonitis  Chronic diastolic congestive heart failure Presently DH  DM 2 CBG well controlled  Chronic hypotension On chronic midodrine  Chronic paroxysmal atrial fibrillation  Seizure disorder  DVT prophylaxis: Lovenox Code Status: DNR - NO CODE Family Communication: no family present at time of exam  Disposition Plan: SDU  Consultants:  ID  Procedures: none  Antimicrobials:  As per ID   Objective: Blood pressure 120/80, pulse 74, temperature 98.7 F (37.1 C), temperature source Axillary, resp. rate 17, height 5\' 10"  (1.778 m), weight 60.8 kg (134 lb), SpO2 100 %.  Intake/Output Summary (Last 24 hours) at 04/10/17 1637 Last data filed at 04/10/17 1632  Gross per  24 hour  Intake                0 ml  Output              800 ml  Net             -800 ml   Filed Weights   04/09/17 1200 04/10/17 0500  Weight: 63.5 kg (139 lb 15.9 oz) 60.8 kg (134 lb)    Examination: General: No acute respiratory distress Lungs: Clear to auscultation bilaterally without wheezes or crackles - poor air movement in bases Cardiovascular: Regular rate and rhythm without murmur gallop or rub normal S1 and S2 Abdomen: Nondistended, soft, bowel sounds positive, no rebound, no ascites, no appreciable mass Extremities: No significant edema bilateral lower extremities - legs flexed up under buttocks  CBC:  Recent Labs Lab 04/09/17 0443 04/10/17 0008  WBC 15.7* 7.2  NEUTROABS 12.1*  --   HGB 8.1* 8.1*  HCT 25.6* 25.6*  MCV 84.5 85.3  PLT 346 260   Basic Metabolic Panel:  Recent Labs Lab 04/09/17 0443 04/09/17 2342 04/10/17 0008  NA 136  --  141  K 4.1  --  3.7  CL 98*  --  109  CO2 30  --  22  GLUCOSE 95  --  88  BUN 60*  --  31*  CREATININE 1.26*  --  0.76  CALCIUM 8.8*  --  8.5*  MG  --  1.8  --   PHOS  --  2.4*  --    GFR: Estimated Creatinine Clearance: 86.6 mL/min (by C-G formula based  on SCr of 0.76 mg/dL).  Liver Function Tests:  Recent Labs Lab 04/09/17 0443 04/10/17 0008  AST 28 29  ALT 16* 16*  ALKPHOS 78 66  BILITOT 0.4 0.5  PROT 6.7 6.0*  ALBUMIN 2.8* 2.7*    HbA1C: Hgb A1c MFr Bld  Date/Time Value Ref Range Status  04/09/2017 09:10 AM 4.5 (L) 4.8 - 5.6 % Final    Comment:    (NOTE)         Pre-diabetes: 5.7 - 6.4         Diabetes: >6.4         Glycemic control for adults with diabetes: <7.0     CBG:  Recent Labs Lab 04/09/17 2324 04/10/17 0332 04/10/17 0803 04/10/17 1200 04/10/17 1517  GLUCAP 78 81 88 101* 95    Recent Results (from the past 240 hour(s))  Blood Culture (routine x 2)     Status: None (Preliminary result)   Collection Time: 04/09/17  4:30 AM  Result Value Ref Range Status   Specimen  Description BLOOD RIGHT ARM  Final   Special Requests   Final    BOTTLES DRAWN AEROBIC AND ANAEROBIC Blood Culture adequate volume   Culture NO GROWTH 1 DAY  Final   Report Status PENDING  Incomplete  Blood Culture (routine x 2)     Status: None (Preliminary result)   Collection Time: 04/09/17  4:45 AM  Result Value Ref Range Status   Specimen Description BLOOD LEFT ARM  Final   Special Requests IN PEDIATRIC BOTTLE Blood Culture adequate volume  Final   Culture NO GROWTH 1 DAY  Final   Report Status PENDING  Incomplete  Culture, Urine     Status: Abnormal (Preliminary result)   Collection Time: 04/09/17  4:50 AM  Result Value Ref Range Status   Specimen Description URINE, CATHETERIZED  Final   Special Requests NONE  Final   Culture >=100,000 COLONIES/mL PROTEUS MIRABILIS (A)  Final   Report Status PENDING  Incomplete  Respiratory Panel by PCR     Status: None   Collection Time: 04/09/17  9:08 AM  Result Value Ref Range Status   Adenovirus NOT DETECTED NOT DETECTED Final   Coronavirus 229E NOT DETECTED NOT DETECTED Final   Coronavirus HKU1 NOT DETECTED NOT DETECTED Final   Coronavirus NL63 NOT DETECTED NOT DETECTED Final   Coronavirus OC43 NOT DETECTED NOT DETECTED Final   Metapneumovirus NOT DETECTED NOT DETECTED Final   Rhinovirus / Enterovirus NOT DETECTED NOT DETECTED Final   Influenza A NOT DETECTED NOT DETECTED Final   Influenza B NOT DETECTED NOT DETECTED Final   Parainfluenza Virus 1 NOT DETECTED NOT DETECTED Final   Parainfluenza Virus 2 NOT DETECTED NOT DETECTED Final   Parainfluenza Virus 3 NOT DETECTED NOT DETECTED Final   Parainfluenza Virus 4 NOT DETECTED NOT DETECTED Final   Respiratory Syncytial Virus NOT DETECTED NOT DETECTED Final   Bordetella pertussis NOT DETECTED NOT DETECTED Final   Chlamydophila pneumoniae NOT DETECTED NOT DETECTED Final   Mycoplasma pneumoniae NOT DETECTED NOT DETECTED Final  Culture, respiratory (NON-Expectorated)     Status: None  (Preliminary result)   Collection Time: 04/09/17  9:22 AM  Result Value Ref Range Status   Specimen Description TRACHEAL ASPIRATE  Final   Special Requests NONE  Final   Gram Stain   Final    FEW WBC PRESENT,BOTH PMN AND MONONUCLEAR RARE SQUAMOUS EPITHELIAL CELLS PRESENT MODERATE GRAM POSITIVE RODS FEW GRAM POSITIVE COCCI IN PAIRS RARE GRAM  NEGATIVE RODS RARE YEAST    Culture CULTURE REINCUBATED FOR BETTER GROWTH  Final   Report Status PENDING  Incomplete  MRSA PCR Screening     Status: Abnormal   Collection Time: 04/10/17  3:48 AM  Result Value Ref Range Status   MRSA by PCR INVALID RESULTS, SPECIMEN SENT FOR CULTURE (A) NEGATIVE Final    Comment: RESULT CALLED TO, READ BACK BY AND VERIFIED WITH: W.DAVIS RN AT 9147 04/10/17 BY A.DAVIS        The GeneXpert MRSA Assay (FDA approved for NASAL specimens only), is one component of a comprehensive MRSA colonization surveillance program. It is not intended to diagnose MRSA infection nor to guide or monitor treatment for MRSA infections.   MRSA culture     Status: None   Collection Time: 04/10/17  3:48 AM  Result Value Ref Range Status   Specimen Description NASAL SWAB  Final   Special Requests NONE  Final   Culture NO MRSA DETECTED  Final   Report Status 04/10/2017 FINAL  Final     Scheduled Meds: . chlorhexidine  15 mL Mouth/Throat BID  . diazepam  5 mg Per Tube BID  . enoxaparin (LOVENOX) injection  40 mg Subcutaneous QHS  . famotidine  20 mg Per Tube BID  . feeding supplement (PRO-STAT SUGAR FREE 64)  60 mL Per Tube Daily  . insulin aspart  0-9 Units Subcutaneous Q4H  . lacosamide  100 mg Per Tube BID  . levETIRAcetam  1,000 mg Oral BID  . midodrine  10 mg Oral TID WC  . multivitamin with minerals  1 tablet Per Tube Daily  . phenazopyridine  200 mg Oral BID  . saccharomyces boulardii  250 mg Oral BID  . sodium chloride flush  3 mL Intravenous Q12H  . traMADol  50 mg Oral BID     LOS: 1 day   Lonia Blood,  MD [facility] Office  [phone] Pager - Text Page per Loretha Stapler as per below:  On-Call/Text Page:      [email or web address]      password [email or web address]  If 7PM-7AM, please contact night-coverage [email or web address] Password [facility] 04/10/2017, 4:37 PM

## 2017-04-11 ENCOUNTER — Inpatient Hospital Stay (HOSPITAL_COMMUNITY): Payer: Medicare Other

## 2017-04-11 ENCOUNTER — Encounter (HOSPITAL_COMMUNITY): Payer: Self-pay | Admitting: *Deleted

## 2017-04-11 DIAGNOSIS — N319 Neuromuscular dysfunction of bladder, unspecified: Secondary | ICD-10-CM

## 2017-04-11 DIAGNOSIS — M869 Osteomyelitis, unspecified: Secondary | ICD-10-CM

## 2017-04-11 DIAGNOSIS — N390 Urinary tract infection, site not specified: Secondary | ICD-10-CM

## 2017-04-11 DIAGNOSIS — S069X0D Unspecified intracranial injury without loss of consciousness, subsequent encounter: Secondary | ICD-10-CM

## 2017-04-11 DIAGNOSIS — G40909 Epilepsy, unspecified, not intractable, without status epilepticus: Secondary | ICD-10-CM

## 2017-04-11 DIAGNOSIS — I482 Chronic atrial fibrillation: Secondary | ICD-10-CM

## 2017-04-11 DIAGNOSIS — E119 Type 2 diabetes mellitus without complications: Secondary | ICD-10-CM

## 2017-04-11 DIAGNOSIS — N10 Acute tubulo-interstitial nephritis: Secondary | ICD-10-CM

## 2017-04-11 DIAGNOSIS — E876 Hypokalemia: Secondary | ICD-10-CM

## 2017-04-11 DIAGNOSIS — G8250 Quadriplegia, unspecified: Secondary | ICD-10-CM

## 2017-04-11 DIAGNOSIS — R042 Hemoptysis: Secondary | ICD-10-CM

## 2017-04-11 DIAGNOSIS — R0789 Other chest pain: Secondary | ICD-10-CM

## 2017-04-11 LAB — BLOOD GAS, ARTERIAL
Acid-Base Excess: 1.9 mmol/L (ref 0.0–2.0)
BICARBONATE: 26.3 mmol/L (ref 20.0–28.0)
DRAWN BY: 331761
FIO2: 40
O2 SAT: 94.9 %
PATIENT TEMPERATURE: 98.6
PO2 ART: 83 mmHg (ref 83.0–108.0)
pCO2 arterial: 44 mmHg (ref 32.0–48.0)
pH, Arterial: 7.394 (ref 7.350–7.450)

## 2017-04-11 LAB — PREPARE RBC (CROSSMATCH)

## 2017-04-11 LAB — GLUCOSE, CAPILLARY
GLUCOSE-CAPILLARY: 130 mg/dL — AB (ref 65–99)
GLUCOSE-CAPILLARY: 129 mg/dL — AB (ref 65–99)
GLUCOSE-CAPILLARY: 128 mg/dL — AB (ref 65–99)
Glucose-Capillary: 115 mg/dL — ABNORMAL HIGH (ref 65–99)
Glucose-Capillary: 126 mg/dL — ABNORMAL HIGH (ref 65–99)

## 2017-04-11 LAB — CBC
HEMATOCRIT: 24 % — AB (ref 39.0–52.0)
HEMOGLOBIN: 7.4 g/dL — AB (ref 13.0–17.0)
MCH: 26.3 pg (ref 26.0–34.0)
MCHC: 30.8 g/dL (ref 30.0–36.0)
MCV: 85.4 fL (ref 78.0–100.0)
Platelets: 271 10*3/uL (ref 150–400)
RBC: 2.81 MIL/uL — AB (ref 4.22–5.81)
RDW: 15.6 % — ABNORMAL HIGH (ref 11.5–15.5)
WBC: 5.1 10*3/uL (ref 4.0–10.5)

## 2017-04-11 LAB — BASIC METABOLIC PANEL
Anion gap: 8 (ref 5–15)
BUN: 16 mg/dL (ref 6–20)
CALCIUM: 8.2 mg/dL — AB (ref 8.9–10.3)
CHLORIDE: 117 mmol/L — AB (ref 101–111)
CO2: 24 mmol/L (ref 22–32)
CREATININE: 0.98 mg/dL (ref 0.61–1.24)
GFR calc Af Amer: >60 mL/min (ref >60)
GFR calc non Af Amer: >60 mL/min (ref >60)
Glucose, Bld: 117 mg/dL — ABNORMAL HIGH (ref 65–99)
Potassium: 3.3 mmol/L — ABNORMAL LOW (ref 3.5–5.1)
SODIUM: 149 mmol/L — AB (ref 135–145)

## 2017-04-11 LAB — CULTURE, RESPIRATORY W GRAM STAIN

## 2017-04-11 LAB — CULTURE, RESPIRATORY

## 2017-04-11 LAB — URINE CULTURE: Culture: >=100000 — AB

## 2017-04-11 MED ORDER — FUROSEMIDE 10 MG/ML IJ SOLN
40.0000 mg | Freq: Every day | INTRAMUSCULAR | Status: DC
  Administered 2017-04-12: 40 mg via INTRAVENOUS
  Filled 2017-04-11: qty 4

## 2017-04-11 MED ORDER — FUROSEMIDE 10 MG/ML IJ SOLN
80.0000 mg | Freq: Once | INTRAMUSCULAR | Status: AC
  Administered 2017-04-11: 80 mg via INTRAVENOUS

## 2017-04-11 MED ORDER — SODIUM CHLORIDE 0.9 % IV SOLN
Freq: Once | INTRAVENOUS | Status: AC
  Administered 2017-04-11: 14:00:00 via INTRAVENOUS

## 2017-04-11 MED ORDER — FUROSEMIDE 10 MG/ML IJ SOLN
INTRAMUSCULAR | Status: AC
  Filled 2017-04-11: qty 8

## 2017-04-11 MED ORDER — POTASSIUM CHLORIDE 10 MEQ/100ML IV SOLN
10.0000 meq | INTRAVENOUS | Status: AC
  Administered 2017-04-11 (×5): 10 meq via INTRAVENOUS
  Filled 2017-04-11 (×4): qty 100

## 2017-04-11 NOTE — Progress Notes (Addendum)
Patient to CT with RT and RRT.   Patient back from CT with RT and RRT at 13:30

## 2017-04-11 NOTE — Progress Notes (Signed)
Subjective:  Non verbal, pt today with several episodes of hemoptysis  Antibiotics:  Anti-infectives    Start     Dose/Rate Route Frequency Ordered Stop   04/10/17 1157  Ampicillin-Sulbactam (UNASYN) 3 g in sodium chloride 0.9 % 100 mL IVPB     3 g 200 mL/hr over 30 Minutes Intravenous Every 6 hours 04/10/17 1056     04/09/17 1300  Ampicillin-Sulbactam (UNASYN) 3 g in sodium chloride 0.9 % 100 mL IVPB  Status:  Discontinued     3 g 200 mL/hr over 30 Minutes Intravenous Every 6 hours 04/09/17 1237 04/09/17 1240   04/09/17 1300  Ampicillin-Sulbactam (UNASYN) 3 g in sodium chloride 0.9 % 100 mL IVPB  Status:  Discontinued     3 g 200 mL/hr over 30 Minutes Intravenous Every 8 hours 04/09/17 1240 04/10/17 1056   04/09/17 0415  levofloxacin (LEVAQUIN) IVPB 750 mg     750 mg 100 mL/hr over 90 Minutes Intravenous  Once 04/09/17 0413 04/09/17 0821   04/09/17 0415  aztreonam (AZACTAM) 2 g in dextrose 5 % 50 mL IVPB     2 g 100 mL/hr over 30 Minutes Intravenous  Once 04/09/17 0413 04/09/17 0522   04/09/17 0415  linezolid (ZYVOX) IVPB 600 mg  Status:  Discontinued     600 mg 300 mL/hr over 60 Minutes Intravenous Every 12 hours 04/09/17 0413 04/09/17 1231      Medications: Scheduled Meds: . chlorhexidine  15 mL Mouth/Throat BID  . diazepam  5 mg Per Tube BID  . enoxaparin (LOVENOX) injection  40 mg Subcutaneous QHS  . famotidine  20 mg Per Tube BID  . feeding supplement (PRO-STAT SUGAR FREE 64)  60 mL Per Tube Daily  . furosemide      . insulin aspart  0-9 Units Subcutaneous Q4H  . lacosamide  100 mg Per Tube BID  . levETIRAcetam  1,000 mg Per Tube BID  . midodrine  10 mg Per Tube TID WC  . multivitamin with minerals  1 tablet Per Tube Daily  . phenazopyridine  200 mg Per Tube BID  . saccharomyces boulardii  250 mg Per Tube BID  . sodium chloride flush  3 mL Intravenous Q12H  . traMADol  50 mg Per Tube BID   Continuous Infusions: . ampicillin-sulbactam (UNASYN) IV 3 g  (04/11/17 1850)  . dextrose 5 % and 0.9% NaCl 1,000 mL (04/11/17 0309)  . feeding supplement (OSMOLITE 1.5 CAL) 1,000 mL (04/11/17 1459)   PRN Meds:.acetaminophen **OR** acetaminophen, ondansetron **OR** ondansetron (ZOFRAN) IV    Objective: Weight change: -5 lb 15.2 oz (-2.7 kg)  Intake/Output Summary (Last 24 hours) at 04/11/17 1913 Last data filed at 04/11/17 1638  Gross per 24 hour  Intake          7277.75 ml  Output             1650 ml  Net          5627.75 ml   Blood pressure (!) 156/124, pulse (!) 126, temperature 99.2 F (37.3 C), temperature source Oral, resp. rate (!) 21, height 5\' 10"  (1.778 m), weight 134 lb 0.6 oz (60.8 kg), SpO2 100 %. Temp:  [97.8 F (36.6 C)-99.2 F (37.3 C)] 99.2 F (37.3 C) (08/02 1637) Pulse Rate:  [79-126] 126 (08/02 1637) Resp:  [6-30] 21 (08/02 1637) BP: (98-157)/(69-141) 156/124 (08/02 1637) SpO2:  [100 %] 100 % (08/02 1637) FiO2 (%):  [40 %] 40 % (  08/02 1637) Weight:  [134 lb 0.6 oz (60.8 kg)] 134 lb 0.6 oz (60.8 kg) (08/02 0441)  Physical Exam: General: Alert and awake,  HEENT: anicteric sclera,, EOMI CVS tachy, Chest: no wheezes    Extremities:   04/10/17: decubitus ulcer          Neuro: quadriplegic with contractures  CBC: @LABBLAST3 (wbc3,Hgb:3,Hct:3,Plt:3,INR:3APTT:3)@   BMET  Recent Labs  04/10/17 0008 04/11/17 0526  NA 141 149*  K 3.7 3.3*  CL 109 117*  CO2 22 24  GLUCOSE 88 117*  BUN 31* 16  CREATININE 0.76 0.98  CALCIUM 8.5* 8.2*     Liver Panel   Recent Labs  04/09/17 0443 04/10/17 0008  PROT 6.7 6.0*  ALBUMIN 2.8* 2.7*  AST 28 29  ALT 16* 16*  ALKPHOS 78 66  BILITOT 0.4 0.5       Sedimentation Rate No results for input(s): ESRSEDRATE in the last 72 hours. C-Reactive Protein No results for input(s): CRP in the last 72 hours.  Micro Results: Recent Results (from the past 720 hour(s))  Blood Culture (routine x 2)     Status: None (Preliminary result)   Collection Time:  04/09/17  4:30 AM  Result Value Ref Range Status   Specimen Description BLOOD RIGHT ARM  Final   Special Requests   Final    BOTTLES DRAWN AEROBIC AND ANAEROBIC Blood Culture adequate volume   Culture NO GROWTH 2 DAYS  Final   Report Status PENDING  Incomplete  Blood Culture (routine x 2)     Status: None (Preliminary result)   Collection Time: 04/09/17  4:45 AM  Result Value Ref Range Status   Specimen Description BLOOD LEFT ARM  Final   Special Requests IN PEDIATRIC BOTTLE Blood Culture adequate volume  Final   Culture NO GROWTH 2 DAYS  Final   Report Status PENDING  Incomplete  Culture, Urine     Status: Abnormal   Collection Time: 04/09/17  4:50 AM  Result Value Ref Range Status   Specimen Description URINE, CATHETERIZED  Final   Special Requests NONE  Final   Culture >=100,000 COLONIES/mL PROTEUS MIRABILIS (A)  Final   Report Status 04/11/2017 FINAL  Final   Organism ID, Bacteria PROTEUS MIRABILIS (A)  Final      Susceptibility   Proteus mirabilis - MIC*    AMPICILLIN <=2 SENSITIVE Sensitive     CEFAZOLIN <=4 SENSITIVE Sensitive     CEFTRIAXONE <=1 SENSITIVE Sensitive     CIPROFLOXACIN >=4 RESISTANT Resistant     GENTAMICIN <=1 SENSITIVE Sensitive     IMIPENEM 1 SENSITIVE Sensitive     NITROFURANTOIN 128 RESISTANT Resistant     TRIMETH/SULFA <=20 SENSITIVE Sensitive     AMPICILLIN/SULBACTAM <=2 SENSITIVE Sensitive     PIP/TAZO <=4 SENSITIVE Sensitive     * >=100,000 COLONIES/mL PROTEUS MIRABILIS  Respiratory Panel by PCR     Status: None   Collection Time: 04/09/17  9:08 AM  Result Value Ref Range Status   Adenovirus NOT DETECTED NOT DETECTED Final   Coronavirus 229E NOT DETECTED NOT DETECTED Final   Coronavirus HKU1 NOT DETECTED NOT DETECTED Final   Coronavirus NL63 NOT DETECTED NOT DETECTED Final   Coronavirus OC43 NOT DETECTED NOT DETECTED Final   Metapneumovirus NOT DETECTED NOT DETECTED Final   Rhinovirus / Enterovirus NOT DETECTED NOT DETECTED Final   Influenza  A NOT DETECTED NOT DETECTED Final   Influenza B NOT DETECTED NOT DETECTED Final   Parainfluenza Virus 1 NOT DETECTED  NOT DETECTED Final   Parainfluenza Virus 2 NOT DETECTED NOT DETECTED Final   Parainfluenza Virus 3 NOT DETECTED NOT DETECTED Final   Parainfluenza Virus 4 NOT DETECTED NOT DETECTED Final   Respiratory Syncytial Virus NOT DETECTED NOT DETECTED Final   Bordetella pertussis NOT DETECTED NOT DETECTED Final   Chlamydophila pneumoniae NOT DETECTED NOT DETECTED Final   Mycoplasma pneumoniae NOT DETECTED NOT DETECTED Final  Culture, respiratory (NON-Expectorated)     Status: Abnormal   Collection Time: 04/09/17  9:22 AM  Result Value Ref Range Status   Specimen Description TRACHEAL ASPIRATE  Final   Special Requests NONE  Final   Gram Stain   Final    FEW WBC PRESENT,BOTH PMN AND MONONUCLEAR RARE SQUAMOUS EPITHELIAL CELLS PRESENT MODERATE GRAM POSITIVE RODS FEW GRAM POSITIVE COCCI IN PAIRS RARE GRAM NEGATIVE RODS RARE YEAST    Culture MULTIPLE ORGANISMS PRESENT, NONE PREDOMINANT (A)  Final   Report Status 04/11/2017 FINAL  Final  MRSA PCR Screening     Status: Abnormal   Collection Time: 04/10/17  3:48 AM  Result Value Ref Range Status   MRSA by PCR INVALID RESULTS, SPECIMEN SENT FOR CULTURE (A) NEGATIVE Final    Comment: RESULT CALLED TO, READ BACK BY AND VERIFIED WITH: W.DAVIS RN AT 0754 04/10/17 BY A.DAVIS        The GeneXpert MRSA Assay (FDA approved for NASAL specimens only), is one component of a comprehensive MRSA colonization surveillance program. It is not intended to diagnose MRSA infection nor to guide or monitor treatment for MRSA infections.   MRSA culture     Status: None   Collection Time: 04/10/17  3:48 AM  Result Value Ref Range Status   Specimen Description NASAL SWAB  Final   Special Requests NONE  Final   Culture NO MRSA DETECTED  Final   Report Status 04/10/2017 FINAL  Final    Studies/Results: Ct Chest Wo Contrast  Result Date:  04/11/2017 CLINICAL DATA:  56 year old male with bleeding at tracheostomy and hypoxia. Patient's upper extremities are contracted and cannot be moved out of the field of view. Best obtainable images. EXAM: CT CHEST WITHOUT CONTRAST TECHNIQUE: Multidetector CT imaging of the chest was performed following the standard protocol without IV contrast. COMPARISON:  Prior chest x-ray 04/09/2017 prior CT scan of the abdomen and pelvis including the lung bases 01/09/2017 FINDINGS: Cardiovascular: Limited evaluation in the absence of intravenous contrast. 2 vessel aortic arch. The brachiocephalic and left common carotid artery share a common origin. The aorta is normal in caliber. The main pulmonary artery is enlarged at 3.9 cm. The heart is normal in size although there is some mild compression secondary to decreased AP diameter of the chest. Mediastinum/Nodes: Unremarkable CT appearance of the thyroid gland. No suspicious mediastinal or hilar adenopathy. No soft tissue mediastinal mass. The thoracic esophagus is unremarkable. A tracheostomy tube is present entering just inferior to the thyroid cartilage and terminating in the trachea just above the carina. Low-attenuation secretions are present within the airway cephalad to the insertion of the tracheostomy tube Lungs/Pleura: Small left and trace right layering pleural effusions. There is associated atelectasis of both the left and right lower lobes. The degree of atelectasis is greater than expected for the volume of pleural fluid. Respiratory motion artifact slightly limits evaluation for pulmonary nodules. There are numerous punctate calcifications in the tree-in-bud micronodular pattern in the right upper lobe likely reflecting sequelae of old infection, granulomatous disease or bronchial impaction. No definite acute pulmonary opacity  or evidence of pneumothorax. Upper Abdomen: No acute abnormality. Musculoskeletal: No acute fracture or aggressive appearing lytic or  blastic osseous lesion. IMPRESSION: 1. Small left and trace right layering pleural effusions with associated bibasilar atelectasis. The amount of atelectasis is greater than expected for the size of the pleural effusions, however there is no definite bronchial or bronchiole occlusion to suggest mucous plugging. 2. Enlarged main pulmonary artery as can be seen in the setting of pulmonary arterial hypertension. 3. Tracheostomy tube enters the airway just inferior to the thyroid cartilage and terminates in the trachea just above the carina. Low-attenuation secretions are noted within the airway cephalad to the insertion of the tracheostomy tube. 4. Sequelae of old infection, granulomatous disease or bronchiole impaction in the right upper lobe with numerous punctate calcifications in a tree-in-bud configuration. Electronically Signed   By: Malachy Moan M.D.   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 My partner Dr. Luciana Axe felt  Was more likely due to UTI  #1 Sepsis: he is improving on unasyn. Urine culture with SENS Proteus  #2 Hemoptysis with pleural effusions and atelectasis +/- PNA: will NOT target prior MDR organisms  given his response to [place] and desire NOT to select for R organisms  Cause of hemoptysis not clear from his CT  Complete 10 days of therapy with unasyn (which can be changed to augmentin if he improves)  I will sign off for now.  Please call with further questions.     LOS: 2 days   Acey Lav 04/11/2017, 7:13 PM

## 2017-04-11 NOTE — Progress Notes (Signed)
PROGRESS NOTE    Dean Graham  ZOX:[id number] DOB: 1960/11/15 DOA: 04/09/2017 PCP: Hillary Bow, MD   Brief Narrative:  56 y.o. 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 fib, DM type II (controlled with diet), recurrent UTI, recurrent Aspiration Pneumonia   Patient was discharged from this facility on 5/4 after an admission for sepsis secondary to Serratia pneumonia resistant to cefazolin, Proteus UTI resistant to Cipro and nitrofurantoin and a prior history of multidrug-resistant pseudomonas and stenotrophomonas pneumonia. Patient was also MRSA PCR positive during that admission. He was treated initially with linezolid and Zosyn and was transitioned to meropenem by ID service noting he is allergic to azithromycin, ceftriaxone and vancomycin. He required full ventilator assistance during that admission but was discharged on a trach collar to [facility].  He was sent to this ER today at family request after 24 hours of fevers between 102 and 102.4F with worsening of baseline chronic hypotension-reported SBP down to the 70s. It was also reported he had increased cough. It was not documented whether he had increased sputum production or change in sputum color. He had been placed on Levaquin 24 hours prior. In the ER his chest x-ray revealed bilateral basilar opacities that could represent infiltration or atelectasis. His lactic acid was normal. White count was elevated at 15,700 with absolute neutrophils 12.1%. His urinalysis was markedly abnormal. Blood cultures were obtained in the ER. Broad-spectrum antibiotics were initiated in the ER. Of note urine culture was not obtained by ER prior to initiation of antibiotics. Patient was afebrile in the ER with a rectal temp of 98.1, he was mildly tachycardic with an initial blood pressure of 86/66 with an MAP of 73. He was given a total of 3000 mL of saline bolus as well. He was maintaining O2  saturations 100% trach collar and was noted with thick clear secretions around the trach site. He also appeared quite dehydrated with a BUN of 60 and a creatinine of 1.26. In review of his medications and other treatments at [facility] it was noted that patient was on Osmolite tube feeding at 50 mL per hour with free water 40 mL every 6 hours.   Subjective: 8/2  patient contracted, response to painful stimuli, occasionally spontaneously moves extremities. ADDENDUM: Patient's bedside secondary to patient appearing to be having seizures.    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 -Patient currently requiring high flow O2 to maintain appropriate SPO2. -Frequent suctioning, producing copious amounts of bright red blood. , -Titrate O2 to maintain SPO2> 93% -Maintain negative fluid balance  Hemoptysis -Patient with copious amounts of bright red blood per trach. Pulmonary hemorrhage vs lung abscess vs pneumonia vs Diffuse Alveolar Hemorrhage -CT scan shows significant bilateral atelectasis/old infection no pulmonary hemorrhage see results below  Sepsis/Proteus pyelonephritis/Pneumonia?,  -Most likely multifactorial to include Proteus pyelonephritis, pneumonia? Left hip osteomyelitis -Continue current antibiotic regimen -See  hemoptysis -Once patient defervesces will need to change out patient's  Foley  Left hip Osteomyelitis (Greater Trochanter) -By definition patient has chronic osteomyelitis, can palpate bone -Initially will cover with foam padding -Wound care consulted. Await recommendation  Hx Multidrug-resistant organisms/infections -ID following and directing antibiotic choice  Acute renal failure Lab Results  Component Value Date   CREATININE 0.98 04/11/2017   CREATININE 0.76 04/10/2017   CREATININE 1.26 (H) 04/09/2017  -resolved with volume resuscitation  TBI with quadriplegia -Patient with extensive history of multiple admissions to acute hospital and LTAC  Neurogenic bladder -Chronic indwelling Foley which has been removed, bladder scan Q 6 hr    Patient to dependent -Site does not appear to be infected.  Chronic diastolic CHF -Strict in and out since admission +6.4 L -Daily weight Filed Weights   04/09/17 1200 04/10/17 0500 04/11/17 0441  Weight: 139 lb 15.9 oz (63.5 kg) 134 lb (60.8 kg) 134 lb 0.6 oz (60.8 kg)  -Transfuse for hemoglobin<8 -8/2 transfuse 1 unit PRBC -8/2 Lasix 80 mg 1 -Maintain negative fluid balance Lasix 40 mg daily  Chronic paroxysmal atrial fibrillation -Currently in NSR -Currently not on medication  Chronic hypotension -10 mg Midodrin TID  DM Type 2 controlled without complication -7/31 Hemoglobin A1c= 4.5 -Sensitive SSI  Seizure disorder (addendum) -At~1700 page to bedside secondary to patient appearing to having active seizures. Upon arrival patient actually having tonic-clonic motions unfortunately patient is nonverbal at baseline. Have requested EEG. -Will continue current medications until EEG. -If EEG positive for breakthrough seizures will increase Keppra and consult Neuro Hospitalist  Hypokalemia -Potassium goal> 4 -Potassium 50 mEq -Recheck K/Mg at 2200: Results to be called to on-call physician     DVT prophylaxis: Lovenox Code Status: DNR Family Communication: None Disposition Plan:  TBD   Consultants:  ID    Procedures/Significant Events:  8/2 CT chest W/O contrast:-Small left and trace right layering pleural effusions with associated bibasilar atelectasis.  -Negative mucus plugging -Enlarged main pulmonary artery. Pulmonary hypertension?  -Sequelae of old infection, granulomatous disease or bronchiole impaction in the right upper lobe with numerous punctate calcifications in a tree-in-bud configuration.   VENTILATOR SETTINGS: Trach collar O2 flow rate 10L/min FiO2 40%     Cultures    Antimicrobials: Anti-infectives    Start     Stop   04/10/17 1157  Ampicillin-Sulbactam (UNASYN) 3 g in sodium chloride 0.9 % 100 mL IVPB         04/09/17 1300  Ampicillin-Sulbactam (UNASYN) 3 g in sodium chloride 0.9 % 100 mL IVPB  Status:  Discontinued     04/09/17 1240   04/09/17 1300  Ampicillin-Sulbactam (UNASYN) 3 g in sodium chloride 0.9 % 100 mL IVPB  Status:  Discontinued     04/10/17 1056   04/09/17 0415  levofloxacin (LEVAQUIN) IVPB 750 mg     04/09/17 0821   04/09/17 0415  aztreonam (AZACTAM) 2 g in dextrose 5 % 50 mL IVPB     04/09/17 0522   04/09/17 0415  linezolid (ZYVOX) IVPB 600 mg  Status:  Discontinued     04/09/17 1231       Devices None   LINES / TUBES:  None    Continuous Infusions: . ampicillin-sulbactam (UNASYN) IV 3 g (04/11/17 0516)  . dextrose 5 % and 0.9% NaCl 1,000 mL (04/11/17 0309)  . feeding supplement (OSMOLITE 1.5 CAL) 1,000 mL (04/11/17 0000)     Objective: Vitals:   04/11/17 1610 04/11/17 0350 04/11/17 9604 04/11/17 5409  BP: 98/70   119/74  Pulse: 94 100  99  Resp: (!) 6 12  (!) 8  Temp:    97.8 F (36.6 C)  TempSrc:    Axillary  SpO2: 100% 100%  100%  Weight:   134 lb 0.6 oz (60.8 kg)   Height:        Intake/Output Summary (Last 24 hours) at 04/11/17 0839 Last data filed at 04/11/17 0831  Gross per 24 hour  Intake          4083.75 ml  Output             1800 ml  Net          2283.75 ml   Filed  Weights   04/09/17 1200 04/10/17 0500 04/11/17 0441  Weight: 139 lb 15.9 oz (63.5 kg) 134 lb (60.8 kg) 134 lb 0.6 oz (60.8 kg)    Examination:  General: Unresponsive except to painful stimuli positive acute on chronic respiratory distress Eyes: negative scleral hemorrhage, negative anisocoria, negative icterus ENT: Positive dried blood around nose, negative gingival bleeding, Neck:  Negative scars, masses, torticollis, lymphadenopathy, JVD, positive chronic trach in place negative sign of infection positive copious amounts of bright red blood coughed up/suctioned Lungs: tachypnea decreased breath sounds by basilar >>Rt, positive apical breath sounds, positive crackles, negative wheezing Cardiovascular: Tachycardic, Regular rhythm without murmur gallop or rub normal S1 and S2 Abdomen: negative abdominal pain, nondistended, positive soft, bowel sounds, no rebound, no ascites, no appreciable mass, PEG tube present LLQ negative sign of infection Extremities: bilateral lower extremities contracted  Skin: Left hip greater trochanteric lesion to the bone ~4 cm in diameter  Psychiatric:  Unable to evaluate  Central nervous system:  Response to painful stimuli (grimaces slightly withdraws)  .     Data Reviewed: Care during the described time interval was provided by me .  I have reviewed this patient's available data, including medical history, events of note, physical examination, and all test results as part of my evaluation. I have personally reviewed and interpreted all radiology studies.  CBC:  Recent Labs Lab 04/09/17 0443 04/10/17 0008 04/11/17 0526  WBC 15.7* 7.2 5.1  NEUTROABS 12.1*  --   --   HGB 8.1* 8.1* 7.4*  HCT 25.6* 25.6* 24.0*  MCV 84.5 85.3 85.4  PLT 346 260 271   Basic Metabolic Panel:  Recent Labs Lab 04/09/17 0443 04/09/17 2342 04/10/17 0008 04/11/17 0526  NA 136  --  141 149*  K 4.1  --  3.7 3.3*  CL 98*  --  109 117*  CO2 30  --  22 24  GLUCOSE 95  --   88 117*  BUN 60*  --  31* 16  CREATININE 1.26*  --  0.76 0.98  CALCIUM 8.8*  --  8.5* 8.2*  MG  --  1.8  --   --   PHOS  --  2.4*  --   --    GFR: Estimated Creatinine Clearance: 70.7 mL/min (by C-G formula based on SCr of 0.98 mg/dL). Liver Function Tests:  Recent Labs Lab 04/09/17 0443 04/10/17 0008  AST 28 29  ALT 16* 16*  ALKPHOS 78 66  BILITOT 0.4 0.5  PROT 6.7 6.0*  ALBUMIN 2.8* 2.7*   No results for input(s): LIPASE, AMYLASE in the last 168 hours. No results for input(s): AMMONIA in the last 168 hours. Coagulation Profile: No results for input(s): INR, PROTIME in the last 168 hours. Cardiac Enzymes: No results for  input(s): CKTOTAL, CKMB, CKMBINDEX, TROPONINI in the last 168 hours. BNP (last 3 results) No results for input(s): PROBNP in the last 8760 hours. HbA1C:  Recent Labs  04/09/17 0910  HGBA1C 4.5*   CBG:  Recent Labs Lab 04/10/17 1517 04/10/17 2010 04/10/17 2317 04/11/17 0335 04/11/17 0829  GLUCAP 95 98 113* 130* 129*   Lipid Profile: No results for input(s): CHOL, HDL, LDLCALC, TRIG, CHOLHDL, LDLDIRECT in the last 72 hours. Thyroid Function Tests: No results for input(s): TSH, T4TOTAL, FREET4, T3FREE, THYROIDAB in the last 72 hours. Anemia Panel: No results for input(s): VITAMINB12, FOLATE, FERRITIN, TIBC, IRON, RETICCTPCT in the last 72 hours. Urine analysis:    Component Value Date/Time   COLORURINE ORANGE (A) 04/09/2017 0450   APPEARANCEUR CLOUDY (A) 04/09/2017 0450   LABSPEC 1.010 04/09/2017 0450   PHURINE 5.0 04/09/2017 0450   GLUCOSEU NEGATIVE 04/09/2017 0450   HGBUR NEGATIVE 04/09/2017 0450   BILIRUBINUR NEGATIVE 04/09/2017 0450   KETONESUR NEGATIVE 04/09/2017 0450   PROTEINUR NEGATIVE 04/09/2017 0450   NITRITE POSITIVE (A) 04/09/2017 0450   LEUKOCYTESUR SMALL (A) 04/09/2017 0450   Sepsis Labs: @LABRCNTIP (procalcitonin:4,lacticidven:4)  ) Recent Results (from the past 240 hour(s))  Blood Culture (routine x 2)     Status:  None (Preliminary result)   Collection Time: 04/09/17  4:30 AM  Result Value Ref Range Status   Specimen Description BLOOD RIGHT ARM  Final   Special Requests   Final    BOTTLES DRAWN AEROBIC AND ANAEROBIC Blood Culture adequate volume   Culture NO GROWTH 1 DAY  Final   Report Status PENDING  Incomplete  Blood Culture (routine x 2)     Status: None (Preliminary result)   Collection Time: 04/09/17  4:45 AM  Result Value Ref Range Status   Specimen Description BLOOD LEFT ARM  Final   Special Requests IN PEDIATRIC BOTTLE Blood Culture adequate volume  Final   Culture NO GROWTH 1 DAY  Final   Report Status PENDING  Incomplete  Culture, Urine     Status: Abnormal (Preliminary result)   Collection Time: 04/09/17  4:50 AM  Result Value Ref Range Status   Specimen Description URINE, CATHETERIZED  Final   Special Requests NONE  Final   Culture >=100,000 COLONIES/mL PROTEUS MIRABILIS (A)  Final   Report Status PENDING  Incomplete  Respiratory Panel by PCR     Status: None   Collection Time: 04/09/17  9:08 AM  Result Value Ref Range Status   Adenovirus NOT DETECTED NOT DETECTED Final   Coronavirus 229E NOT DETECTED NOT DETECTED Final   Coronavirus HKU1 NOT DETECTED NOT DETECTED Final   Coronavirus NL63 NOT DETECTED NOT DETECTED Final   Coronavirus OC43 NOT DETECTED NOT DETECTED Final   Metapneumovirus NOT DETECTED NOT DETECTED Final   Rhinovirus / Enterovirus NOT DETECTED NOT DETECTED Final   Influenza A NOT DETECTED NOT DETECTED Final   Influenza B NOT DETECTED NOT DETECTED Final   Parainfluenza Virus 1 NOT DETECTED NOT DETECTED Final   Parainfluenza Virus 2 NOT DETECTED NOT DETECTED Final   Parainfluenza Virus 3 NOT DETECTED NOT DETECTED Final   Parainfluenza Virus 4 NOT DETECTED NOT DETECTED Final   Respiratory Syncytial Virus NOT DETECTED NOT DETECTED Final   Bordetella pertussis NOT DETECTED NOT DETECTED Final   Chlamydophila pneumoniae NOT DETECTED NOT DETECTED Final   Mycoplasma  pneumoniae NOT DETECTED NOT DETECTED Final  Culture, respiratory (NON-Expectorated)     Status: None (Preliminary result)   Collection Time: 04/09/17  9:22 AM  Result Value Ref Range Status   Specimen Description TRACHEAL ASPIRATE  Final   Special Requests NONE  Final   Gram Stain   Final    FEW WBC PRESENT,BOTH PMN AND MONONUCLEAR RARE SQUAMOUS EPITHELIAL CELLS PRESENT MODERATE GRAM POSITIVE RODS FEW GRAM POSITIVE COCCI IN PAIRS RARE GRAM NEGATIVE RODS RARE YEAST    Culture CULTURE REINCUBATED FOR BETTER GROWTH  Final   Report Status PENDING  Incomplete  MRSA PCR Screening     Status: Abnormal   Collection Time: 04/10/17  3:48 AM  Result Value Ref Range Status   MRSA by PCR INVALID RESULTS, SPECIMEN SENT FOR CULTURE (A) NEGATIVE Final    Comment: RESULT CALLED TO, READ BACK BY AND VERIFIED WITH: [email or web address] RN AT [id number] 04/10/17 BY A.DAVIS        The GeneXpert MRSA Assay (FDA approved for NASAL specimens only), is one component of a comprehensive MRSA colonization surveillance program. It is not intended to diagnose MRSA infection nor to guide or monitor treatment for MRSA infections.   MRSA culture     Status: None   Collection Time: 04/10/17  3:48 AM  Result Value Ref Range Status   Specimen Description NASAL SWAB  Final   Special Requests NONE  Final   Culture NO MRSA DETECTED  Final   Report Status 04/10/2017 FINAL  Final         Radiology Studies: No results found.      Scheduled Meds: . chlorhexidine  15 mL Mouth/Throat BID  . diazepam  5 mg Per Tube BID  . enoxaparin (LOVENOX) injection  40 mg Subcutaneous QHS  . famotidine  20 mg Per Tube BID  . feeding supplement (PRO-STAT SUGAR FREE 64)  60 mL Per Tube Daily  . insulin aspart  0-9 Units Subcutaneous Q4H  . lacosamide  100 mg Per Tube BID  . levETIRAcetam  1,000 mg Per Tube BID  . midodrine  10 mg Per Tube TID WC  . multivitamin with minerals  1 tablet Per Tube Daily  . phenazopyridine  200 mg Per  Tube BID  . saccharomyces boulardii  250 mg Per Tube BID  . sodium chloride flush  3 mL Intravenous Q12H  . traMADol  50 mg Per Tube BID   Continuous Infusions: . ampicillin-sulbactam (UNASYN) IV 3 g (04/11/17 0516)  . dextrose 5 % and 0.9% NaCl 1,000 mL (04/11/17 0309)  . feeding supplement (OSMOLITE 1.5 CAL) 1,000 mL (04/11/17 0000)     LOS: 2 days    Time spent: 40 minutes    WOODS, Roselind Messier, MD [facility] Pager [phone]   If 7PM-7AM, please contact night-coverage [email or web address] Password [facility] 04/11/2017, 8:39 AM

## 2017-04-11 NOTE — Procedures (Signed)
Date of recording 04/11/2017  Referring physician Lyda Jester words  Reason for the study 56 year old male with history of traumatic brain injury quadriplegic with history of seizure disorder nonverbal and altered mental status.  Technical Distal multichannel EEG recording using 10-20 international electrode system  Description of the recording. EEG comprised of generalized delta slowing intermixed with fast beta activity which is probably a medication effect such as benzodiazepine. Breach rhythm observed in the frontocentral leads. Intermittent bifrontal delta slowing. Ictal or interictal pattern not seen.  Impression EEG is abnormal and findings are suggestive of moderate to severe generalized cerebral dysfunction. Epileptiform features were not seen during this recording.

## 2017-04-11 NOTE — Care Management Note (Addendum)
Case Management Note  Patient Details  Name: Jahshua Bonito MRN: [id number] Date of Birth: 07/08/1961  Subjective/Objective:    Pt admitted with hypotension and fevers               Action/Plan:  PTA from [facility] - chronic trach pt.  CSW consulted   Expected Discharge Date:                  Expected Discharge Plan:  Skilled Nursing Facility (from [facility] - CSW consulted 8/2)  In-House Referral:  Clinical Social Work  Discharge planning Services  CM Consult  Post Acute Care Choice:    Choice offered to:     DME Arranged:    DME Agency:     HH Arranged:    HH Agency:     Status of Service:     If discussed at [employer], dates discussed:    Additional Comments: CM left voicemail for mom requesting call back to discuss LTACH request  CM informed that family is interested in pt discharging to [facility] closer to home. CM texted paged attending to request LTACH discharge consideration - PTA pt is from [facility] with chronic conditiion.  CM will pursue LTACH referral if deemed appropriate by attending.  Additionally, CM previously informed by [facility] that pt is out of LTACH days covered by [employer], secondary insurance medicaid does not offer medicaid benefit.  CM and  CSW continuing to follow.   Cherylann Parr, RN 04/11/2017, 2:37 PM

## 2017-04-11 NOTE — Progress Notes (Signed)
EEG completed, results pending. 

## 2017-04-11 NOTE — Progress Notes (Signed)
Subjective:  Non verbal  Antibiotics:  Anti-infectives    Start     Dose/Rate Route Frequency Ordered Stop   04/10/17 1157  Ampicillin-Sulbactam (UNASYN) 3 g in sodium chloride 0.9 % 100 mL IVPB     3 g 200 mL/hr over 30 Minutes Intravenous Every 6 hours 04/10/17 1056     04/09/17 1300  Ampicillin-Sulbactam (UNASYN) 3 g in sodium chloride 0.9 % 100 mL IVPB  Status:  Discontinued     3 g 200 mL/hr over 30 Minutes Intravenous Every 6 hours 04/09/17 1237 04/09/17 1240   04/09/17 1300  Ampicillin-Sulbactam (UNASYN) 3 g in sodium chloride 0.9 % 100 mL IVPB  Status:  Discontinued     3 g 200 mL/hr over 30 Minutes Intravenous Every 8 hours 04/09/17 1240 04/10/17 1056   04/09/17 0415  levofloxacin (LEVAQUIN) IVPB 750 mg     750 mg 100 mL/hr over 90 Minutes Intravenous  Once 04/09/17 0413 04/09/17 0821   04/09/17 0415  aztreonam (AZACTAM) 2 g in dextrose 5 % 50 mL IVPB     2 g 100 mL/hr over 30 Minutes Intravenous  Once 04/09/17 0413 04/09/17 0522   04/09/17 0415  linezolid (ZYVOX) IVPB 600 mg  Status:  Discontinued     600 mg 300 mL/hr over 60 Minutes Intravenous Every 12 hours 04/09/17 0413 04/09/17 1231      Medications: Scheduled Meds: . chlorhexidine  15 mL Mouth/Throat BID  . diazepam  5 mg Per Tube BID  . enoxaparin (LOVENOX) injection  40 mg Subcutaneous QHS  . famotidine  20 mg Per Tube BID  . feeding supplement (PRO-STAT SUGAR FREE 64)  60 mL Per Tube Daily  . furosemide      . insulin aspart  0-9 Units Subcutaneous Q4H  . lacosamide  100 mg Per Tube BID  . levETIRAcetam  1,000 mg Per Tube BID  . midodrine  10 mg Per Tube TID WC  . multivitamin with minerals  1 tablet Per Tube Daily  . phenazopyridine  200 mg Per Tube BID  . saccharomyces boulardii  250 mg Per Tube BID  . sodium chloride flush  3 mL Intravenous Q12H  . traMADol  50 mg Per Tube BID   Continuous Infusions: . ampicillin-sulbactam (UNASYN) IV 3 g (04/11/17 1850)  . dextrose 5 % and 0.9% NaCl  1,000 mL (04/11/17 0309)  . feeding supplement (OSMOLITE 1.5 CAL) 1,000 mL (04/11/17 1459)   PRN Meds:.acetaminophen **OR** acetaminophen, ondansetron **OR** ondansetron (ZOFRAN) IV    Objective: Weight change: -5 lb 15.2 oz (-2.7 kg)  Intake/Output Summary (Last 24 hours) at 04/11/17 1906 Last data filed at 04/11/17 1638  Gross per 24 hour  Intake          7277.75 ml  Output             1650 ml  Net          5627.75 ml   Blood pressure (!) 156/124, pulse (!) 126, temperature 99.2 F (37.3 C), temperature source Oral, resp. rate (!) 21, height 5\' 10"  (1.778 m), weight 134 lb 0.6 oz (60.8 kg), SpO2 100 %. Temp:  [97.8 F (36.6 C)-99.2 F (37.3 C)] 99.2 F (37.3 C) (08/02 1637) Pulse Rate:  [79-126] 126 (08/02 1637) Resp:  [6-30] 21 (08/02 1637) BP: (98-157)/(69-141) 156/124 (08/02 1637) SpO2:  [100 %] 100 % (08/02 1637) FiO2 (%):  [40 %] 40 % (08/02 1637) Weight:  [134 lb 0.6  oz (60.8 kg)] 134 lb 0.6 oz (60.8 kg) (08/02 0441)  Physical Exam: General: Alert and awake,  HEENT: anicteric sclera, pupils reactive to light and accommodation, EOMI CVS tachy, normal r,  no murmur rubs or gallops Chest: rhonhi   Abdomen: soft nontender, nondistended, normal bowel sound   Extremities:   04/10/17: decubitus ulcer          Neuro: quadriplegic with contractures  CBC: @LABBLAST3 (wbc3,Hgb:3,Hct:3,Plt:3,INR:3APTT:3)@   BMET  Recent Labs  04/10/17 0008 04/11/17 0526  NA 141 149*  K 3.7 3.3*  CL 109 117*  CO2 22 24  GLUCOSE 88 117*  BUN 31* 16  CREATININE 0.76 0.98  CALCIUM 8.5* 8.2*     Liver Panel   Recent Labs  04/09/17 0443 04/10/17 0008  PROT 6.7 6.0*  ALBUMIN 2.8* 2.7*  AST 28 29  ALT 16* 16*  ALKPHOS 78 66  BILITOT 0.4 0.5       Sedimentation Rate No results for input(s): ESRSEDRATE in the last 72 hours. C-Reactive Protein No results for input(s): CRP in the last 72 hours.  Micro Results: Recent Results (from the past 720 hour(s))    Blood Culture (routine x 2)     Status: None (Preliminary result)   Collection Time: 04/09/17  4:30 AM  Result Value Ref Range Status   Specimen Description BLOOD RIGHT ARM  Final   Special Requests   Final    BOTTLES DRAWN AEROBIC AND ANAEROBIC Blood Culture adequate volume   Culture NO GROWTH 2 DAYS  Final   Report Status PENDING  Incomplete  Blood Culture (routine x 2)     Status: None (Preliminary result)   Collection Time: 04/09/17  4:45 AM  Result Value Ref Range Status   Specimen Description BLOOD LEFT ARM  Final   Special Requests IN PEDIATRIC BOTTLE Blood Culture adequate volume  Final   Culture NO GROWTH 2 DAYS  Final   Report Status PENDING  Incomplete  Culture, Urine     Status: Abnormal   Collection Time: 04/09/17  4:50 AM  Result Value Ref Range Status   Specimen Description URINE, CATHETERIZED  Final   Special Requests NONE  Final   Culture >=100,000 COLONIES/mL PROTEUS MIRABILIS (A)  Final   Report Status 04/11/2017 FINAL  Final   Organism ID, Bacteria PROTEUS MIRABILIS (A)  Final      Susceptibility   Proteus mirabilis - MIC*    AMPICILLIN <=2 SENSITIVE Sensitive     CEFAZOLIN <=4 SENSITIVE Sensitive     CEFTRIAXONE <=1 SENSITIVE Sensitive     CIPROFLOXACIN >=4 RESISTANT Resistant     GENTAMICIN <=1 SENSITIVE Sensitive     IMIPENEM 1 SENSITIVE Sensitive     NITROFURANTOIN 128 RESISTANT Resistant     TRIMETH/SULFA <=20 SENSITIVE Sensitive     AMPICILLIN/SULBACTAM <=2 SENSITIVE Sensitive     PIP/TAZO <=4 SENSITIVE Sensitive     * >=100,000 COLONIES/mL PROTEUS MIRABILIS  Respiratory Panel by PCR     Status: None   Collection Time: 04/09/17  9:08 AM  Result Value Ref Range Status   Adenovirus NOT DETECTED NOT DETECTED Final   Coronavirus 229E NOT DETECTED NOT DETECTED Final   Coronavirus HKU1 NOT DETECTED NOT DETECTED Final   Coronavirus NL63 NOT DETECTED NOT DETECTED Final   Coronavirus OC43 NOT DETECTED NOT DETECTED Final   Metapneumovirus NOT DETECTED NOT  DETECTED Final   Rhinovirus / Enterovirus NOT DETECTED NOT DETECTED Final   Influenza A NOT DETECTED NOT DETECTED Final  Influenza B NOT DETECTED NOT DETECTED Final   Parainfluenza Virus 1 NOT DETECTED NOT DETECTED Final   Parainfluenza Virus 2 NOT DETECTED NOT DETECTED Final   Parainfluenza Virus 3 NOT DETECTED NOT DETECTED Final   Parainfluenza Virus 4 NOT DETECTED NOT DETECTED Final   Respiratory Syncytial Virus NOT DETECTED NOT DETECTED Final   Bordetella pertussis NOT DETECTED NOT DETECTED Final   Chlamydophila pneumoniae NOT DETECTED NOT DETECTED Final   Mycoplasma pneumoniae NOT DETECTED NOT DETECTED Final  Culture, respiratory (NON-Expectorated)     Status: Abnormal   Collection Time: 04/09/17  9:22 AM  Result Value Ref Range Status   Specimen Description TRACHEAL ASPIRATE  Final   Special Requests NONE  Final   Gram Stain   Final    FEW WBC PRESENT,BOTH PMN AND MONONUCLEAR RARE SQUAMOUS EPITHELIAL CELLS PRESENT MODERATE GRAM POSITIVE RODS FEW GRAM POSITIVE COCCI IN PAIRS RARE GRAM NEGATIVE RODS RARE YEAST    Culture MULTIPLE ORGANISMS PRESENT, NONE PREDOMINANT (A)  Final   Report Status 04/11/2017 FINAL  Final  MRSA PCR Screening     Status: Abnormal   Collection Time: 04/10/17  3:48 AM  Result Value Ref Range Status   MRSA by PCR INVALID RESULTS, SPECIMEN SENT FOR CULTURE (A) NEGATIVE Final    Comment: RESULT CALLED TO, READ BACK BY AND VERIFIED WITH: W.DAVIS RN AT 0754 04/10/17 BY A.DAVIS        The GeneXpert MRSA Assay (FDA approved for NASAL specimens only), is one component of a comprehensive MRSA colonization surveillance program. It is not intended to diagnose MRSA infection nor to guide or monitor treatment for MRSA infections.   MRSA culture     Status: None   Collection Time: 04/10/17  3:48 AM  Result Value Ref Range Status   Specimen Description NASAL SWAB  Final   Special Requests NONE  Final   Culture NO MRSA DETECTED  Final   Report Status  04/10/2017 FINAL  Final    Studies/Results: Ct Chest Wo Contrast  Result Date: 04/11/2017 CLINICAL DATA:  56 year old male with bleeding at tracheostomy and hypoxia. Patient's upper extremities are contracted and cannot be moved out of the field of view. Best obtainable images. EXAM: CT CHEST WITHOUT CONTRAST TECHNIQUE: Multidetector CT imaging of the chest was performed following the standard protocol without IV contrast. COMPARISON:  Prior chest x-ray 04/09/2017 prior CT scan of the abdomen and pelvis including the lung bases 01/09/2017 FINDINGS: Cardiovascular: Limited evaluation in the absence of intravenous contrast. 2 vessel aortic arch. The brachiocephalic and left common carotid artery share a common origin. The aorta is normal in caliber. The main pulmonary artery is enlarged at 3.9 cm. The heart is normal in size although there is some mild compression secondary to decreased AP diameter of the chest. Mediastinum/Nodes: Unremarkable CT appearance of the thyroid gland. No suspicious mediastinal or hilar adenopathy. No soft tissue mediastinal mass. The thoracic esophagus is unremarkable. A tracheostomy tube is present entering just inferior to the thyroid cartilage and terminating in the trachea just above the carina. Low-attenuation secretions are present within the airway cephalad to the insertion of the tracheostomy tube Lungs/Pleura: Small left and trace right layering pleural effusions. There is associated atelectasis of both the left and right lower lobes. The degree of atelectasis is greater than expected for the volume of pleural fluid. Respiratory motion artifact slightly limits evaluation for pulmonary nodules. There are numerous punctate calcifications in the tree-in-bud micronodular pattern in the right upper lobe likely reflecting  sequelae of old infection, granulomatous disease or bronchial impaction. No definite acute pulmonary opacity or evidence of pneumothorax. Upper Abdomen: No acute  abnormality. Musculoskeletal: No acute fracture or aggressive appearing lytic or blastic osseous lesion. IMPRESSION: 1. Small left and trace right layering pleural effusions with associated bibasilar atelectasis. The amount of atelectasis is greater than expected for the size of the pleural effusions, however there is no definite bronchial or bronchiole occlusion to suggest mucous plugging. 2. Enlarged main pulmonary artery as can be seen in the setting of pulmonary arterial hypertension. 3. Tracheostomy tube enters the airway just inferior to the thyroid cartilage and terminates in the trachea just above the carina. Low-attenuation secretions are noted within the airway cephalad to the insertion of the tracheostomy tube. 4. Sequelae of old infection, granulomatous disease or bronchiole impaction in the right upper lobe with numerous punctate calcifications in a tree-in-bud configuration. Electronically Signed   By: Malachy Moan M.D.   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 My partner Dr. Luciana Axe felt  Was more likely due to UTI  #1 Sepsis: he is improving on unasyn. follouwup cultures   LOS: 2 days   Acey Lav 04/11/2017, 7:06  PM

## 2017-04-11 NOTE — Consult Note (Signed)
WOC Nurse wound consult note Reason for Consult: left hip wound Patient with long standing history of pressure injuries. Seen by [facility] nurse team in the past. Bedbound, contracted patient, trach, PEG feedings from [facility] Wound type: Healing Stage 4 Pressure Injury left ischium Pressure Injury POA: Yes Measurement: two small open areas in larger re- epithelized area, both area approx. 0.2cm x 0.3cm x 0.1cm with intact skin bridge between the two areas Wound bed: pink, moist yellow fibrin and scarring Drainage (amount, consistency, odor) minimal at the time of my assessment Periwound: intact  Bedside nurse reports this is the only open wound currently   Dressing procedure/placement/frequency:  Add silver hydrofiber to wound, for recalcitrant wound. Cover with foam dressing change every other day.  Low air loss mattress for pressure redistribution in place.   Discussed POC with bedside nurse.  Re consult if needed, will not follow at this time. Thanks  [place] Monds [employer], RN,CWOCN, CNS, CWON-AP [phone])

## 2017-04-12 DIAGNOSIS — R6510 Systemic inflammatory response syndrome (SIRS) of non-infectious origin without acute organ dysfunction: Secondary | ICD-10-CM

## 2017-04-12 LAB — GLUCOSE, CAPILLARY
GLUCOSE-CAPILLARY: 105 mg/dL — AB (ref 65–99)
GLUCOSE-CAPILLARY: 117 mg/dL — AB (ref 65–99)
GLUCOSE-CAPILLARY: 91 mg/dL (ref 65–99)
GLUCOSE-CAPILLARY: 94 mg/dL (ref 65–99)
Glucose-Capillary: 113 mg/dL — ABNORMAL HIGH (ref 65–99)
Glucose-Capillary: 119 mg/dL — ABNORMAL HIGH (ref 65–99)
Glucose-Capillary: 113 mg/dL — ABNORMAL HIGH (ref 65–99)

## 2017-04-12 LAB — BASIC METABOLIC PANEL
ANION GAP: 10 (ref 5–15)
BUN: 15 mg/dL (ref 6–20)
CALCIUM: 9.1 mg/dL (ref 8.9–10.3)
CO2: 27 mmol/L (ref 22–32)
CREATININE: 0.76 mg/dL (ref 0.61–1.24)
Chloride: 109 mmol/L (ref 101–111)
GFR calc Af Amer: >60 mL/min (ref >60)
GLUCOSE: 103 mg/dL — AB (ref 65–99)
Potassium: 3.8 mmol/L (ref 3.5–5.1)
Sodium: 146 mmol/L — ABNORMAL HIGH (ref 135–145)

## 2017-04-12 LAB — CBC
HCT: 30.1 % — ABNORMAL LOW (ref 39.0–52.0)
Hemoglobin: 9.6 g/dL — ABNORMAL LOW (ref 13.0–17.0)
MCH: 26.7 pg (ref 26.0–34.0)
MCHC: 31.9 g/dL (ref 30.0–36.0)
MCV: 83.8 fL (ref 78.0–100.0)
PLATELETS: 318 10*3/uL (ref 150–400)
RBC: 3.59 MIL/uL — AB (ref 4.22–5.81)
RDW: 17.4 % — AB (ref 11.5–15.5)
WBC: 8.6 10*3/uL (ref 4.0–10.5)

## 2017-04-12 LAB — TYPE AND SCREEN
ABO/RH(D): A POS
ANTIBODY SCREEN: NEGATIVE
Unit division: 0

## 2017-04-12 LAB — BPAM RBC
Blood Product Expiration Date: 201808232359
ISSUE DATE / TIME: 201808021358
Unit Type and Rh: 6200

## 2017-04-12 LAB — MAGNESIUM
MAGNESIUM: 1.6 mg/dL — AB (ref 1.7–2.4)
Magnesium: 1.6 mg/dL — ABNORMAL LOW (ref 1.7–2.4)

## 2017-04-12 LAB — POTASSIUM: POTASSIUM: 3.6 mmol/L (ref 3.5–5.1)

## 2017-04-12 MED ORDER — FREE WATER
100.0000 mL | Freq: Three times a day (TID) | Status: DC
Start: 2017-04-12 — End: 2017-04-12

## 2017-04-12 MED ORDER — FREE WATER
200.0000 mL | Freq: Three times a day (TID) | Status: DC
  Administered 2017-04-12 – 2017-04-13 (×2): 200 mL

## 2017-04-12 NOTE — Clinical Social Work Note (Signed)
Clinical Social Work Assessment  Patient Details  Name: Dean Graham MRN: [id number] Date of Birth: [birth year]  Date of referral:  04/12/17               Reason for consult:  Facility Placement, Discharge Planning                Permission sought to share information with:  Facility Medical sales representative, Family Supports Permission granted to share information::  Yes, Verbal Permission Granted  Name::     Leno Mathes  Agency::  SNF's  Relationship::  Mother  Contact Information:  [phone]  Housing/Transportation Living arrangements for the past 2 months:  Skilled Nursing Facility Source of Information:  Medical Team, Parent Patient Interpreter Needed:  None Criminal Activity/Legal Involvement Pertinent to Current Situation/Hospitalization:  No - Comment as needed Significant Relationships:  Parents, Siblings Lives with:  Facility Resident Do you feel safe going back to the place where you live?  Yes Need for family participation in patient care:  Yes (Comment)  Care giving concerns:  Patient was admitted from [facility].   Social Worker assessment / plan:  Patient not oriented. No supports at bedside. CSW spoke with patient's mother over the phone. She confirmed that patient was admitted from [facility] and she is agreeable to him returning there at discharge. CSW still waiting on call back from admissions coordinator. Patient's mother also wants to look into placement at [facility] in [facility]. This appears to be an LTACH. RNCM will look into this facility as she is not familiar with it. No further concerns. CSW encouraged patient's mother to contact CSW as needed. CSW will continue to follow patient and his mother for support and facilitate discharge back to SNF once medically stable.   Employment status:  Disabled (Comment on whether or not currently receiving Disability) Insurance information:  Medicare PT Recommendations:  Not assessed at this  time Information / Referral to community resources:  Skilled Nursing Facility  Patient/Family's Response to care:  Patient not oriented. Patient's mother agreeable to return to SNF but wants to look into [facility] in [facility] near where they live. Patient's mother supportive and involved in patient's care. Patient's mother appreciated social work intervention.  Patient/Family's Understanding of and Emotional Response to Diagnosis, Current Treatment, and Prognosis:  Patient's mother has a good understanding of the reason for admission and the need for patient to return to SNF when stable. Patient's mother appears happy with hospital care.  Emotional Assessment Appearance:  Appears stated age Attitude/Demeanor/Rapport:  Unable to Assess Affect (typically observed):  Unable to Assess Orientation:    Alcohol / Substance use:  Never Used Psych involvement (Current and /or in the community):  No (Comment)  Discharge Needs  Concerns to be addressed:  Care Coordination Readmission within the last 30 days:  No Current discharge risk:  Cognitively Impaired, Dependent with Mobility Barriers to Discharge:  Continued Medical Work up   Margarito Liner, LCSW 04/12/2017, 1:20 PM

## 2017-04-12 NOTE — [place] FL2 (Signed)
[place] MEDICAID FL2 LEVEL OF CARE SCREENING TOOL     IDENTIFICATION  Patient Name: Dean Graham Birthdate: 10-27-1960 Sex: male Admission Date (Current Location): 04/09/2017  [place] and [place] Number:   Andrey [place])   Facility and Address:  [facility], [address] [place], [place] [address]      Provider Number: [id number]  Attending Physician Name and Address:  Lonia Blood, MD  Relative Name and Phone Number:       Current Level of Care: Hospital Recommended Level of Care: Skilled Nursing Facility Prior Approval Number:    Date Approved/Denied:   PASRR Number: [id number]  Discharge Plan: SNF    Current Diagnoses: Patient Active Problem List   Diagnosis Date Noted  . Chest pain, localized   . SIRS (systemic inflammatory response syndrome) (HCC) 04/09/2017  . Chronic diastolic heart failure (HCC) 04/09/2017  . GERD (gastroesophageal reflux disease) 04/09/2017  . Abnormal urinalysis 04/09/2017  . Atrial fibrillation, currently in sinus rhythm 04/09/2017  . Tracheostomy in place [facility]) 04/09/2017  . Chronic respiratory failure with hypoxia (HCC) 04/09/2017  . Chronic indwelling Foley catheter 04/09/2017  . 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 Diabetes mellitus type 2, diet-controlled (HCC) 04/09/2017  . Neurogenic bladder 04/09/2017  . Chronic hypotension 04/09/2017  . H/O quadriplegia 04/09/2017  . Acute kidney injury (HCC) 04/09/2017  . Acute hypoxemic respiratory failure (HCC)   . Ventilator dependent (HCC)   . 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 Fever   . Leukocytosis   . Aspiration pneumonia (HCC) 01/04/2017  . Anasarca   . Hemoptysis   . Tracheostomy care (HCC)   . Hypoalbuminemia   . Cough with frothy sputum   . VAP (ventilator-associated pneumonia) (HCC)   . Carbapenem-resistant Acinetobacter baumannii infection   . 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 History of MDR Pseudomonas aeruginosa infection   . Chronic post traumatic encephalopathy   . Acute encephalopathy   . Hypokalemia   . Hypomagnesemia   . Urinary tract infection, site not specified   . Acute on chronic respiratory failure with hypoxia (HCC)   . Dysphagia   . Decubitus ulcer of sacral area   . Encounter for wound care   . Protein-calorie malnutrition, severe (HCC)   . Palliative care encounter   . Goals of care, counseling/discussion   . Pressure injury of skin 05/31/2016  . Quadriplegia (HCC) 05/31/2016  . Atrial fibrillation (HCC) 05/31/2016  . Diabetes mellitus without complication (HCC) 05/31/2016  . Chronic respiratory failure (HCC) 05/31/2016  . 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 Septic shock (HCC) 05/29/2016  . HCAP (healthcare-associated pneumonia)     Orientation RESPIRATION BLADDER Height & Weight      (Unable to assess)  Tracheostomy (Cuffed: 10 L at 40%) Incontinent, External catheter Weight: 134 lb 0.6 oz (60.8 kg) Height:  5\' 10"  (177.8 cm)  BEHAVIORAL SYMPTOMS/MOOD NEUROLOGICAL BOWEL NUTRITION STATUS   (None) Convulsions/Seizures (TBI) Incontinent Feeding tube (PEG)  AMBULATORY STATUS COMMUNICATION OF NEEDS Skin   Total Care Does not communicate PU Stage and Appropriate Care       PU Stage 4 Dressing:  (Left hip: Foam every other day)               Personal Care Assistance Level of Assistance  Total care       Total Care Assistance: Maximum assistance   Functional Limitations Info  Speech  Speech Info: Impaired Air cabin crew)    SPECIAL CARE FACTORS FREQUENCY  Blood pressure                    Contractures Contractures Info: Present    Additional Factors Info  Code Status, Allergies, Isolation Precautions Code Status Info: DNR Allergies Info: Ativan (Lorazepam), Azithromycin, Ceftriaxone, Vancomycin     Isolation Precautions Info: Contact: OMDRO, MRSA     Current Medications  (04/12/2017):  This is the current hospital active medication list Current Facility-Administered Medications  Medication Dose Route Frequency Provider Last Rate Last Dose  . acetaminophen (TYLENOL) tablet 650 mg  650 mg Per Tube Q6H PRN Lonia Blood, MD       Or  . acetaminophen (TYLENOL) suppository 650 mg  650 mg Rectal Q6H PRN Lonia Blood, MD      . Ampicillin-Sulbactam (UNASYN) 3 g in sodium chloride 0.9 % 100 mL IVPB  3 g Intravenous Q6H Earnie Larsson, [facility]   Stopped at 04/12/17 0701  . chlorhexidine (PERIDEX) 0.12 % solution 15 mL  15 mL Mouth/Throat BID Russella Dar, NP   15 mL at 04/12/17 0948  . diazepam (VALIUM) tablet 5 mg  5 mg Per Tube BID Russella Dar, NP   5 mg at 04/12/17 1009  . enoxaparin (LOVENOX) injection 40 mg  40 mg Subcutaneous QHS Russella Dar, NP   40 mg at 04/11/17 2254  . famotidine (PEPCID) tablet 20 mg  20 mg Per Tube BID Russella Dar, NP   20 mg at 04/12/17 0955  . feeding supplement (OSMOLITE 1.5 CAL) liquid 1,000 mL  1,000 mL Per Tube Continuous Lonia Blood, MD 50 mL/hr at 04/11/17 1459 1,000 mL at 04/11/17 1459  . feeding supplement (PRO-STAT SUGAR FREE 64) liquid 60 mL  60 mL Per Tube Daily Lonia Blood, MD   60 mL at 04/12/17 0951  . furosemide (LASIX) injection 40 mg  40 mg Intravenous Daily Drema Dallas, MD   40 mg at 04/12/17 1005  . insulin aspart (novoLOG) injection 0-9 Units  0-9 Units Subcutaneous Q4H Russella Dar, NP   1 Units at 04/12/17 0100  . lacosamide (VIMPAT) tablet 100 mg  100 mg Per Tube BID Russella Dar, NP   100 mg at 04/12/17 0953  . levETIRAcetam (KEPPRA) 100 MG/ML solution 1,000 mg  1,000 mg Per Tube BID Lonia Blood, MD   1,000 mg at 04/12/17 1003  . midodrine (PROAMATINE) tablet 10 mg  10 mg Per Tube TID WC Lonia Blood, MD   10 mg at 04/12/17 0954  . multivitamin with minerals tablet 1 tablet  1 tablet Per Tube Daily Russella Dar, NP   1 tablet at 04/12/17 0955  .  ondansetron (ZOFRAN) tablet 4 mg  4 mg Per Tube Q6H PRN Lonia Blood, MD       Or  . ondansetron [facility]) injection 4 mg  4 mg Intravenous Q6H PRN Lonia Blood, MD      . phenazopyridine (PYRIDIUM) tablet 200 mg  200 mg Per Tube BID Lonia Blood, MD   200 mg at 04/12/17 1610  . saccharomyces boulardii (FLORASTOR) capsule 250 mg  250 mg Per Tube BID Lonia Blood, MD   250 mg at 04/12/17 0955  . sodium chloride flush (NS) 0.9 % injection 3 mL  3 mL Intravenous Q12H Junious Silk L, NP   3 mL at 04/12/17 1003  .  traMADol (ULTRAM) tablet 50 mg  50 mg Per Tube BID Lonia Blood, MD   50 mg at 04/12/17 0981     Discharge Medications: Please see discharge summary for a list of discharge medications.  Relevant Imaging Results:  Relevant Lab Results:   Additional Information SS#: [id number]  Margarito Liner, LCSW

## 2017-04-12 NOTE — Clinical Social Work Note (Addendum)
CSW called patient's mother who stated she would speak with CSW when she arrived on the unit. She was going to get something to eat. Secretary will notify CSW when she arrives. CSW left voicemail for admissions coordinator at [facility].  Charlynn Court, CSW [phone]  1:40 pm CSW has tried calling [facility] admissions coordinator's office number twice. Unable to leave a voicemail.  Charlynn Court, CSW [phone]  1:43 pm CSW spoke with admissions coordinator at [facility]. She stated that patient is able to return when stable but they do not have a bed available today. When asked if she knew when a bed would be available, she stated they would know more on Monday.  Charlynn Court, CSW [phone]

## 2017-04-12 NOTE — Progress Notes (Signed)
[facility] TEAM 1 - Stepdown/ICU TEAM  Dean Graham  [id number] DOB: 09-09-1961 DOA: 04/09/2017 PCP: Hillary Bow, MD    Brief Narrative:  56 y.o. male w/ a Hx of multiple complex chronic medical conditions who presented in sepsis w/ fever to 102.9, WBC of 15.7, and systolics as low as 44mm Hg.    Subjective: No new acute events today.  Bleeding appears to have stopped.  The patient is now able to nod understanding to some of my questions.  He appears to be in no acute respiratory distress.  There are no convulsions during my exam.  There is no family present.  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 Joseph Art - WOC following   Acute kidney injury Resolved with volume expansion  History of traumatic brain injury with quadriplegia  ?Neurogenic bladder - chronic indwelling Foley catheter Foley has been removed - no evidence of retention at this time   Tracheostomy dependence/chronic hypoxic respiratory failure Stable at present   PEG tube dependent PEG insertion site is quite low on the abdom - cont tube feeding   History of recurrent aspiration pneumonitis  Chronic diastolic congestive heart failure Appears to be approaching euvolemic state Filed Weights   04/10/17 0500 04/11/17 0441 04/12/17 0325  Weight: 60.8 kg (134 lb) 60.8 kg (134 lb 0.6 oz) 60.8 kg (134 lb 0.6 oz)    DM 2 CBG well controlled  Chronic hypotension On chronic midodrine - BP stable   Chronic paroxysmal atrial fibrillation NSR at time of exam today   Seizure disorder Possible seizure  noted on 8/2 - EEG w/o evidence of seizure at time of test   DVT prophylaxis: Lovenox Code Status: DNR - NO CODE Family Communication: no family present at time of exam  Disposition Plan: SDU  Consultants:  ID  Procedures: none  Antimicrobials:  Unasyn 7/31 >  Objective: Blood pressure 114/90, pulse (!) 103, temperature 99.2 F (37.3 C), temperature source Axillary, resp. rate (!) 24, height 5\' 10"  (1.778 m), weight 60.8 kg (134 lb 0.6 oz), SpO2 96 %.  Intake/Output Summary (Last 24 hours) at 04/12/17 1612 Last data filed at 04/12/17 1055  Gross per 24 hour  Intake          1819.84 ml  Output             2250 ml  Net          -430.16 ml   Filed Weights   04/10/17 0500 04/11/17 0441 04/12/17 0325  Weight: 60.8 kg (134 lb) 60.8 kg (134 lb 0.6 oz) 60.8 kg (134 lb 0.6 oz)    Examination: General: No acute respiratory distress evident  Lungs: Clear to auscultation bilaterally without wheezes or crackles - trach w/o secretions or bleeding noted  Cardiovascular: RRR - no M or gallup  Abdomen: Nondistended, soft, bowel sounds positive, no ascites, no mass Extremities: No edema BLE - legs flexed up under buttocks  CBC:  Recent Labs Lab 04/09/17 0443 04/10/17 0008 04/11/17 0526 04/12/17 0258  WBC 15.7* 7.2 5.1 8.6  NEUTROABS 12.1*  --   --   --  HGB 8.1* 8.1* 7.4* 9.6*  HCT 25.6* 25.6* 24.0* 30.1*  MCV 84.5 85.3 85.4 83.8  PLT 346 260 271 318   Basic Metabolic Panel:  Recent Labs Lab 04/09/17 0443 04/09/17 2342 04/10/17 0008 04/11/17 0526 04/11/17 2300 04/12/17 0258  NA 136  --  141 149*  --  146*  K 4.1  --  3.7 3.3* 3.6 3.8  CL 98*  --  109 117*  --  109  CO2 30  --  22 24  --  27  GLUCOSE 95  --  88 117*  --  103*  BUN 60*  --  31* 16  --  15  CREATININE 1.26*  --  0.76 0.98  --  0.76  CALCIUM 8.8*  --  8.5* 8.2*  --  9.1  MG  --  1.8  --   --  1.6* 1.6*  PHOS  --  2.4*  --   --   --   --    GFR: Estimated Creatinine Clearance: 86.6 mL/min (by C-G  formula based on SCr of 0.76 mg/dL).  Liver Function Tests:  Recent Labs Lab 04/09/17 0443 04/10/17 0008  AST 28 29  ALT 16* 16*  ALKPHOS 78 66  BILITOT 0.4 0.5  PROT 6.7 6.0*  ALBUMIN 2.8* 2.7*    HbA1C: Hgb A1c MFr Bld  Date/Time Value Ref Range Status  04/09/2017 09:10 AM 4.5 (L) 4.8 - 5.6 % Final    Comment:    (NOTE)         Pre-diabetes: 5.7 - 6.4         Diabetes: >6.4         Glycemic control for adults with diabetes: <7.0     CBG:  Recent Labs Lab 04/11/17 1920 04/11/17 2316 04/12/17 0325 04/12/17 0818 04/12/17 1251  GLUCAP 128* 126* 91 119* 117*    Recent Results (from the past 240 hour(s))  Blood Culture (routine x 2)     Status: None (Preliminary result)   Collection Time: 04/09/17  4:30 AM  Result Value Ref Range Status   Specimen Description BLOOD RIGHT ARM  Final   Special Requests   Final    BOTTLES DRAWN AEROBIC AND ANAEROBIC Blood Culture adequate volume   Culture NO GROWTH 3 DAYS  Final   Report Status PENDING  Incomplete  Blood Culture (routine x 2)     Status: None (Preliminary result)   Collection Time: 04/09/17  4:45 AM  Result Value Ref Range Status   Specimen Description BLOOD LEFT ARM  Final   Special Requests IN PEDIATRIC BOTTLE Blood Culture adequate volume  Final   Culture NO GROWTH 3 DAYS  Final   Report Status PENDING  Incomplete  Culture, Urine     Status: Abnormal   Collection Time: 04/09/17  4:50 AM  Result Value Ref Range Status   Specimen Description URINE, CATHETERIZED  Final   Special Requests NONE  Final   Culture >=100,000 COLONIES/mL PROTEUS MIRABILIS (A)  Final   Report Status 04/11/2017 FINAL  Final   Organism ID, Bacteria PROTEUS MIRABILIS (A)  Final      Susceptibility   Proteus mirabilis - MIC*    AMPICILLIN <=2 SENSITIVE Sensitive     CEFAZOLIN <=4 SENSITIVE Sensitive     CEFTRIAXONE <=1 SENSITIVE Sensitive     CIPROFLOXACIN >=4 RESISTANT Resistant     GENTAMICIN <=1 SENSITIVE Sensitive     IMIPENEM  1 SENSITIVE Sensitive     NITROFURANTOIN 128  RESISTANT Resistant     TRIMETH/SULFA <=20 SENSITIVE Sensitive     AMPICILLIN/SULBACTAM <=2 SENSITIVE Sensitive     PIP/TAZO <=4 SENSITIVE Sensitive     * >=100,000 COLONIES/mL PROTEUS MIRABILIS  Respiratory Panel by PCR     Status: None   Collection Time: 04/09/17  9:08 AM  Result Value Ref Range Status   Adenovirus NOT DETECTED NOT DETECTED Final   Coronavirus 229E NOT DETECTED NOT DETECTED Final   Coronavirus HKU1 NOT DETECTED NOT DETECTED Final   Coronavirus NL63 NOT DETECTED NOT DETECTED Final   Coronavirus OC43 NOT DETECTED NOT DETECTED Final   Metapneumovirus NOT DETECTED NOT DETECTED Final   Rhinovirus / Enterovirus NOT DETECTED NOT DETECTED Final   Influenza A NOT DETECTED NOT DETECTED Final   Influenza B NOT DETECTED NOT DETECTED Final   Parainfluenza Virus 1 NOT DETECTED NOT DETECTED Final   Parainfluenza Virus 2 NOT DETECTED NOT DETECTED Final   Parainfluenza Virus 3 NOT DETECTED NOT DETECTED Final   Parainfluenza Virus 4 NOT DETECTED NOT DETECTED Final   Respiratory Syncytial Virus NOT DETECTED NOT DETECTED Final   Bordetella pertussis NOT DETECTED NOT DETECTED Final   Chlamydophila pneumoniae NOT DETECTED NOT DETECTED Final   Mycoplasma pneumoniae NOT DETECTED NOT DETECTED Final  Culture, respiratory (NON-Expectorated)     Status: Abnormal   Collection Time: 04/09/17  9:22 AM  Result Value Ref Range Status   Specimen Description TRACHEAL ASPIRATE  Final   Special Requests NONE  Final   Gram Stain   Final    FEW WBC PRESENT,BOTH PMN AND MONONUCLEAR RARE SQUAMOUS EPITHELIAL CELLS PRESENT MODERATE GRAM POSITIVE RODS FEW GRAM POSITIVE COCCI IN PAIRS RARE GRAM NEGATIVE RODS RARE YEAST    Culture MULTIPLE ORGANISMS PRESENT, NONE PREDOMINANT (A)  Final   Report Status 04/11/2017 FINAL  Final  MRSA PCR Screening     Status: Abnormal   Collection Time: 04/10/17  3:48 AM  Result Value Ref Range Status   MRSA by PCR INVALID  RESULTS, SPECIMEN SENT FOR CULTURE (A) NEGATIVE Final    Comment: RESULT CALLED TO, READ BACK BY AND VERIFIED WITH: W.DAVIS RN AT 0754 04/10/17 BY A.DAVIS        The GeneXpert MRSA Assay (FDA approved for NASAL specimens only), is one component of a comprehensive MRSA colonization surveillance program. It is not intended to diagnose MRSA infection nor to guide or monitor treatment for MRSA infections.   MRSA culture     Status: None   Collection Time: 04/10/17  3:48 AM  Result Value Ref Range Status   Specimen Description NASAL SWAB  Final   Special Requests NONE  Final   Culture NO MRSA DETECTED  Final   Report Status 04/10/2017 FINAL  Final     Scheduled Meds: . chlorhexidine  15 mL Mouth/Throat BID  . diazepam  5 mg Per Tube BID  . enoxaparin (LOVENOX) injection  40 mg Subcutaneous QHS  . famotidine  20 mg Per Tube BID  . feeding supplement (PRO-STAT SUGAR FREE 64)  60 mL Per Tube Daily  . furosemide  40 mg Intravenous Daily  . insulin aspart  0-9 Units Subcutaneous Q4H  . lacosamide  100 mg Per Tube BID  . levETIRAcetam  1,000 mg Per Tube BID  . midodrine  10 mg Per Tube TID WC  . multivitamin with minerals  1 tablet Per Tube Daily  . phenazopyridine  200 mg Per Tube BID  . saccharomyces boulardii  250 mg Per Tube  BID  . sodium chloride flush  3 mL Intravenous Q12H  . traMADol  50 mg Per Tube BID     LOS: 3 days   Lonia Blood, MD [facility] Office  [phone] Pager - Text Page per Loretha Stapler as per below:  On-Call/Text Page:      [email or web address]      password [email or web address]  If 7PM-7AM, please contact night-coverage [email or web address] Password [email or web address] 04/12/2017, 4:12 PM

## 2017-04-13 LAB — GLUCOSE, CAPILLARY
GLUCOSE-CAPILLARY: 129 mg/dL — AB (ref 65–99)
GLUCOSE-CAPILLARY: 116 mg/dL — AB (ref 65–99)
Glucose-Capillary: 121 mg/dL — ABNORMAL HIGH (ref 65–99)
Glucose-Capillary: 123 mg/dL — ABNORMAL HIGH (ref 65–99)
Glucose-Capillary: 130 mg/dL — ABNORMAL HIGH (ref 65–99)
Glucose-Capillary: 92 mg/dL (ref 65–99)

## 2017-04-13 LAB — BASIC METABOLIC PANEL
ANION GAP: 8 (ref 5–15)
BUN: 22 mg/dL — ABNORMAL HIGH (ref 6–20)
CO2: 30 mmol/L (ref 22–32)
Calcium: 9.1 mg/dL (ref 8.9–10.3)
Chloride: 110 mmol/L (ref 101–111)
Creatinine, Ser: 0.86 mg/dL (ref 0.61–1.24)
GLUCOSE: 138 mg/dL — AB (ref 65–99)
POTASSIUM: 3.8 mmol/L (ref 3.5–5.1)
Sodium: 148 mmol/L — ABNORMAL HIGH (ref 135–145)

## 2017-04-13 LAB — CBC
HEMATOCRIT: 32.2 % — AB (ref 39.0–52.0)
HEMOGLOBIN: 9.8 g/dL — AB (ref 13.0–17.0)
MCH: 25.9 pg — ABNORMAL LOW (ref 26.0–34.0)
MCHC: 30.4 g/dL (ref 30.0–36.0)
MCV: 85 fL (ref 78.0–100.0)
Platelets: 297 10*3/uL (ref 150–400)
RBC: 3.79 MIL/uL — AB (ref 4.22–5.81)
RDW: 16.8 % — ABNORMAL HIGH (ref 11.5–15.5)
WBC: 10.9 10*3/uL — AB (ref 4.0–10.5)

## 2017-04-13 LAB — MAGNESIUM: MAGNESIUM: 1.6 mg/dL — AB (ref 1.7–2.4)

## 2017-04-13 MED ORDER — AMOXICILLIN-POT CLAVULANATE 875-125 MG PO TABS
1.0000 | ORAL_TABLET | Freq: Two times a day (BID) | ORAL | Status: AC
  Administered 2017-04-14 (×2): 1
  Filled 2017-04-13 (×2): qty 1

## 2017-04-13 MED ORDER — DIAZEPAM 2 MG PO TABS
2.0000 mg | ORAL_TABLET | Freq: Two times a day (BID) | ORAL | Status: DC
  Administered 2017-04-13 – 2017-04-15 (×4): 2 mg
  Filled 2017-04-13 (×4): qty 1

## 2017-04-13 MED ORDER — FREE WATER
250.0000 mL | Freq: Four times a day (QID) | Status: DC
  Administered 2017-04-13 – 2017-04-14 (×3): 250 mL

## 2017-04-13 MED ORDER — MAGNESIUM SULFATE 2 GM/50ML IV SOLN
2.0000 g | Freq: Once | INTRAVENOUS | Status: AC
  Administered 2017-04-13: 2 g via INTRAVENOUS
  Filled 2017-04-13: qty 50

## 2017-04-13 NOTE — Progress Notes (Signed)
[facility] TEAM 1 - Stepdown/ICU TEAM  Dean Graham  OIZ:[id number] DOB: 1960-12-18 DOA: 04/09/2017 PCP: Hillary Bow, MD    Brief Narrative:  56 y.o. male w/ a Hx of multiple complex chronic medical conditions who presented in sepsis w/ fever to 102.9, WBC of 15.7, and systolics as low as 24mm Hg.    Subjective: Patient is resting comfortably in bed.  There is no evidence of respiratory distress.  There is no bleeding per trach.  There is no evidence of uncontrolled pain.  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 Joseph Art - WOC following   Acute kidney injury Resolved with volume expansion  History of traumatic brain injury with quadriplegia  ?Neurogenic bladder - chronic indwelling Foley catheter Foley has been removed - no evidence of retention at this time   Tracheostomy dependence/chronic hypoxic respiratory failure Stable at present - weaning O2 as able   PEG tube dependent PEG insertion site is quite low on the abdom - cont tube feeding - tolerating w/o difficulty at this time   History of recurrent aspiration pneumonitis  Chronic diastolic congestive heart failure Appears to be euvolemic  Filed Weights   04/11/17 0441 04/12/17 0325 04/13/17 0359  Weight: 60.8 kg (134 lb 0.6 oz) 60.8 kg (134 lb 0.6 oz) 56.8 kg (125 lb 3.5 oz)    DM 2 CBG well controlled  Chronic hypotension On chronic midodrine - BP stable   Chronic paroxysmal atrial fibrillation NSR presently   Seizure disorder Possible seizure noted  on 8/2 - EEG w/o evidence of seizure at time of test   DVT prophylaxis: Lovenox Code Status: DNR - NO CODE Family Communication: no family present at time of exam  Disposition Plan: SDU - appears to be approaching point of stability for d/c to SNF   Consultants:  ID  Procedures: none  Antimicrobials:  Unasyn 7/31 >  Objective: Blood pressure 126/90, pulse 94, temperature 99.1 F (37.3 C), temperature source Axillary, resp. rate 17, height 5\' 10"  (1.778 m), weight 56.8 kg (125 lb 3.5 oz), SpO2 90 %.  Intake/Output Summary (Last 24 hours) at 04/13/17 1419 Last data filed at 04/13/17 0000  Gross per 24 hour  Intake          1374.17 ml  Output                0 ml  Net          1374.17 ml   Filed Weights   04/11/17 0441 04/12/17 0325 04/13/17 0359  Weight: 60.8 kg (134 lb 0.6 oz) 60.8 kg (134 lb 0.6 oz) 56.8 kg (125 lb 3.5 oz)    Examination: General: No acute respiratory distress observed  Lungs: poor air movement B bases - no wheezing - no d/c or bleeding from trach   Cardiovascular: RRR w/o M or gallup  Abdomen: Nondistended, soft, bowel sounds positive Extremities: No signif edema B LE   CBC:  Recent Labs Lab 04/09/17 0443 04/10/17 0008 04/11/17 0526 04/12/17 0258 04/13/17 0833  WBC 15.7*  7.2 5.1 8.6 10.9*  NEUTROABS 12.1*  --   --   --   --   HGB 8.1* 8.1* 7.4* 9.6* 9.8*  HCT 25.6* 25.6* 24.0* 30.1* 32.2*  MCV 84.5 85.3 85.4 83.8 85.0  PLT 346 260 271 318 297   Basic Metabolic Panel:  Recent Labs Lab 04/09/17 0443 04/09/17 2342 04/10/17 0008 04/11/17 0526 04/11/17 2300 04/12/17 0258 04/13/17 0833  NA 136  --  141 149*  --  146* 148*  K 4.1  --  3.7 3.3* 3.6 3.8 3.8  CL 98*  --  109 117*  --  109 110  CO2 30  --  22 24  --  27 30  GLUCOSE 95  --  88 117*  --  103* 138*  BUN 60*  --  31* 16  --  15 22*  CREATININE 1.26*  --  0.76 0.98  --  0.76 0.86  CALCIUM 8.8*  --  8.5* 8.2*  --  9.1 9.1  MG  --  1.8  --   --  1.6* 1.6* 1.6*  PHOS  --  2.4*   --   --   --   --   --    GFR: Estimated Creatinine Clearance: 75.2 mL/min (by C-G formula based on SCr of 0.86 mg/dL).  Liver Function Tests:  Recent Labs Lab 04/09/17 0443 04/10/17 0008  AST 28 29  ALT 16* 16*  ALKPHOS 78 66  BILITOT 0.4 0.5  PROT 6.7 6.0*  ALBUMIN 2.8* 2.7*    HbA1C: Hgb A1c MFr Bld  Date/Time Value Ref Range Status  04/09/2017 09:10 AM 4.5 (L) 4.8 - 5.6 % Final    Comment:    (NOTE)         Pre-diabetes: 5.7 - 6.4         Diabetes: >6.4         Glycemic control for adults with diabetes: <7.0     CBG:  Recent Labs Lab 04/12/17 1956 04/12/17 2348 04/13/17 0336 04/13/17 0751 04/13/17 1215  GLUCAP 94 105* 121* 129* 116*    Recent Results (from the past 240 hour(s))  Blood Culture (routine x 2)     Status: None (Preliminary result)   Collection Time: 04/09/17  4:30 AM  Result Value Ref Range Status   Specimen Description BLOOD RIGHT ARM  Final   Special Requests   Final    BOTTLES DRAWN AEROBIC AND ANAEROBIC Blood Culture adequate volume   Culture NO GROWTH 3 DAYS  Final   Report Status PENDING  Incomplete  Blood Culture (routine x 2)     Status: None (Preliminary result)   Collection Time: 04/09/17  4:45 AM  Result Value Ref Range Status   Specimen Description BLOOD LEFT ARM  Final   Special Requests IN PEDIATRIC BOTTLE Blood Culture adequate volume  Final   Culture NO GROWTH 3 DAYS  Final   Report Status PENDING  Incomplete  Culture, Urine     Status: Abnormal   Collection Time: 04/09/17  4:50 AM  Result Value Ref Range Status   Specimen Description URINE, CATHETERIZED  Final   Special Requests NONE  Final   Culture >=100,000 COLONIES/mL PROTEUS MIRABILIS (A)  Final   Report Status 04/11/2017 FINAL  Final   Organism ID, Bacteria PROTEUS MIRABILIS (A)  Final      Susceptibility   Proteus mirabilis - MIC*    AMPICILLIN <=2 SENSITIVE Sensitive     CEFAZOLIN <=4 SENSITIVE Sensitive  CEFTRIAXONE <=1 SENSITIVE Sensitive      CIPROFLOXACIN >=4 RESISTANT Resistant     GENTAMICIN <=1 SENSITIVE Sensitive     IMIPENEM 1 SENSITIVE Sensitive     NITROFURANTOIN 128 RESISTANT Resistant     TRIMETH/SULFA <=20 SENSITIVE Sensitive     AMPICILLIN/SULBACTAM <=2 SENSITIVE Sensitive     PIP/TAZO <=4 SENSITIVE Sensitive     * >=100,000 COLONIES/mL PROTEUS MIRABILIS  Respiratory Panel by PCR     Status: None   Collection Time: 04/09/17  9:08 AM  Result Value Ref Range Status   Adenovirus NOT DETECTED NOT DETECTED Final   Coronavirus 229E NOT DETECTED NOT DETECTED Final   Coronavirus HKU1 NOT DETECTED NOT DETECTED Final   Coronavirus NL63 NOT DETECTED NOT DETECTED Final   Coronavirus OC43 NOT DETECTED NOT DETECTED Final   Metapneumovirus NOT DETECTED NOT DETECTED Final   Rhinovirus / Enterovirus NOT DETECTED NOT DETECTED Final   Influenza A NOT DETECTED NOT DETECTED Final   Influenza B NOT DETECTED NOT DETECTED Final   Parainfluenza Virus 1 NOT DETECTED NOT DETECTED Final   Parainfluenza Virus 2 NOT DETECTED NOT DETECTED Final   Parainfluenza Virus 3 NOT DETECTED NOT DETECTED Final   Parainfluenza Virus 4 NOT DETECTED NOT DETECTED Final   Respiratory Syncytial Virus NOT DETECTED NOT DETECTED Final   Bordetella pertussis NOT DETECTED NOT DETECTED Final   Chlamydophila pneumoniae NOT DETECTED NOT DETECTED Final   Mycoplasma pneumoniae NOT DETECTED NOT DETECTED Final  Culture, respiratory (NON-Expectorated)     Status: Abnormal   Collection Time: 04/09/17  9:22 AM  Result Value Ref Range Status   Specimen Description TRACHEAL ASPIRATE  Final   Special Requests NONE  Final   Gram Stain   Final    FEW WBC PRESENT,BOTH PMN AND MONONUCLEAR RARE SQUAMOUS EPITHELIAL CELLS PRESENT MODERATE GRAM POSITIVE RODS FEW GRAM POSITIVE COCCI IN PAIRS RARE GRAM NEGATIVE RODS RARE YEAST    Culture MULTIPLE ORGANISMS PRESENT, NONE PREDOMINANT (A)  Final   Report Status 04/11/2017 FINAL  Final  MRSA PCR Screening     Status: Abnormal    Collection Time: 04/10/17  3:48 AM  Result Value Ref Range Status   MRSA by PCR INVALID RESULTS, SPECIMEN SENT FOR CULTURE (A) NEGATIVE Final    Comment: RESULT CALLED TO, READ BACK BY AND VERIFIED WITH: W.DAVIS RN AT 0754 04/10/17 BY A.DAVIS        The GeneXpert MRSA Assay (FDA approved for NASAL specimens only), is one component of a comprehensive MRSA colonization surveillance program. It is not intended to diagnose MRSA infection nor to guide or monitor treatment for MRSA infections.   MRSA culture     Status: None   Collection Time: 04/10/17  3:48 AM  Result Value Ref Range Status   Specimen Description NASAL SWAB  Final   Special Requests NONE  Final   Culture NO MRSA DETECTED  Final   Report Status 04/10/2017 FINAL  Final     Scheduled Meds: . chlorhexidine  15 mL Mouth/Throat BID  . diazepam  5 mg Per Tube BID  . enoxaparin (LOVENOX) injection  40 mg Subcutaneous QHS  . famotidine  20 mg Per Tube BID  . feeding supplement (PRO-STAT SUGAR FREE 64)  60 mL Per Tube Daily  . free water  200 mL Per Tube Q8H  . insulin aspart  0-9 Units Subcutaneous Q4H  . lacosamide  100 mg Per Tube BID  . levETIRAcetam  1,000 mg Per Tube BID  .  midodrine  10 mg Per Tube TID WC  . multivitamin with minerals  1 tablet Per Tube Daily  . phenazopyridine  200 mg Per Tube BID  . saccharomyces boulardii  250 mg Per Tube BID  . sodium chloride flush  3 mL Intravenous Q12H  . traMADol  50 mg Per Tube BID     LOS: 4 days   Lonia Blood, MD [facility] Office  [phone] Pager - Text Page per Loretha Stapler as per below:  On-Call/Text Page:      [email or web address]      password [email or web address]  If 7PM-7AM, please contact night-coverage [email or web address] Password [email or web address] 04/13/2017, 2:19 PM

## 2017-04-14 LAB — BASIC METABOLIC PANEL
ANION GAP: 6 (ref 5–15)
BUN: 26 mg/dL — AB (ref 6–20)
CO2: 33 mmol/L — ABNORMAL HIGH (ref 22–32)
Calcium: 9.1 mg/dL (ref 8.9–10.3)
Chloride: 109 mmol/L (ref 101–111)
Creatinine, Ser: 0.84 mg/dL (ref 0.61–1.24)
GLUCOSE: 164 mg/dL — AB (ref 65–99)
POTASSIUM: 3.7 mmol/L (ref 3.5–5.1)
Sodium: 148 mmol/L — ABNORMAL HIGH (ref 135–145)

## 2017-04-14 LAB — GLUCOSE, CAPILLARY
GLUCOSE-CAPILLARY: 154 mg/dL — AB (ref 65–99)
GLUCOSE-CAPILLARY: 96 mg/dL (ref 65–99)
GLUCOSE-CAPILLARY: 110 mg/dL — AB (ref 65–99)
GLUCOSE-CAPILLARY: 104 mg/dL — AB (ref 65–99)
Glucose-Capillary: 93 mg/dL (ref 65–99)
Glucose-Capillary: 125 mg/dL — ABNORMAL HIGH (ref 65–99)

## 2017-04-14 LAB — CULTURE, BLOOD (ROUTINE X 2)
Culture: NO GROWTH
Culture: NO GROWTH
SPECIAL REQUESTS: ADEQUATE
Special Requests: ADEQUATE

## 2017-04-14 LAB — CBC
HEMATOCRIT: 30.2 % — AB (ref 39.0–52.0)
Hemoglobin: 9.1 g/dL — ABNORMAL LOW (ref 13.0–17.0)
MCH: 25.9 pg — ABNORMAL LOW (ref 26.0–34.0)
MCHC: 30.1 g/dL (ref 30.0–36.0)
MCV: 86 fL (ref 78.0–100.0)
Platelets: 293 10*3/uL (ref 150–400)
RBC: 3.51 MIL/uL — ABNORMAL LOW (ref 4.22–5.81)
RDW: 16.8 % — AB (ref 11.5–15.5)
WBC: 11.3 10*3/uL — ABNORMAL HIGH (ref 4.0–10.5)

## 2017-04-14 LAB — MAGNESIUM: Magnesium: 2.2 mg/dL (ref 1.7–2.4)

## 2017-04-14 MED ORDER — FREE WATER
300.0000 mL | Freq: Four times a day (QID) | Status: DC
  Administered 2017-04-14 – 2017-04-15 (×4): 300 mL

## 2017-04-14 NOTE — Progress Notes (Signed)
[facility] TEAM 1 - Stepdown/ICU TEAM  Malikiah Hoole  CHJ:[id number] DOB: 16-Jun-1961 DOA: 04/09/2017 PCP: Hillary Bow, MD    Brief Narrative:  56 y.o. male w/ a Hx of multiple complex chronic medical conditions who presented in sepsis w/ fever to 102.9, WBC of 15.7, and systolics as low as 44mm Hg.    Subjective: Scheduled benzo dose was decreased yesterday in an attempt to allow the pt more time to be awake.  He is more interactive at the time of my visit.  He does not appear to be in resp distress or suffering w/ uncontrolled pain.    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 Joseph Art - WOC following   Acute kidney injury Resolved with volume expansion  History of traumatic brain injury with quadriplegia Will need to return to SNF at time of d/c   ?Neurogenic bladder - chronic indwelling Foley catheter Foley has been removed during this hospital stay - no evidence of retention w/ good consistent UOP   Tracheostomy dependence/chronic hypoxic respiratory failure Weaning O2 as able via TC - manage trach secretions   PEG tube dependent PEG insertion site is quite low on the abdom - cont tube feeding - tolerating w/o difficulty at this time - increasing free water   History of recurrent aspiration pneumonitis  Chronic diastolic congestive heart failure Appears to be euvolemic today/slightly  [facility] Filed Weights   04/12/17 0325 04/13/17 0359 04/14/17 0350  Weight: 60.8 kg (134 lb 0.6 oz) 56.8 kg (125 lb 3.5 oz) 57.2 kg (126 lb 1.6 oz)    DM 2 CBG remains well controlled  Chronic hypotension On chronic midodrine - BP stable   Chronic paroxysmal atrial fibrillation NSR presently though is developing some tachycardia - this may be related to anxiety w/ decreasing dose of benzo - follow   Seizure disorder Possible seizure noted on 8/2 - EEG w/o evidence of seizure at time of test   DVT prophylaxis: Lovenox Code Status: DNR - NO CODE Family Communication: no family present at time of exam  Disposition Plan: SDU - anticipate pt will be ready to return to his SNF on 8/6  Consultants:  ID  Procedures: none  Antimicrobials:  Unasyn 7/31 > 8/4 Augmentin 8/5   Objective: Blood pressure 104/69, pulse (!) 122, temperature 97.8 F (36.6 C), temperature source Axillary, resp. rate (!) 25, height 5\' 10"  (1.778 m), weight 57.2 kg (126 lb 1.6 oz), SpO2 97 %.  Intake/Output Summary (Last 24 hours) at 04/14/17 1019 Last data filed at 04/14/17 0952  Gross per 24 hour  Intake              550 ml  Output              500 ml  Net               50 ml   [employer]  04/12/17 0325 04/13/17 0359 04/14/17 0350  Weight: 60.8 kg (134 lb 0.6 oz) 56.8 kg (125 lb 3.5 oz) 57.2 kg (126 lb 1.6 oz)    Examination: General: No acute respiratory distress - stable on TC  Lungs: poor air movement B bases w/o wheezing   Cardiovascular: RRR - no rub or gallup  Abdomen: Nondistended, soft, bowel sounds positive - PEG insertion clean/dry  Extremities: No signif edema B LE - legs flexed/contracted   CBC:  Recent Labs Lab 04/09/17 0443 04/10/17 0008 04/11/17 0526 04/12/17 0258 04/13/17 0833 04/14/17 0325  WBC 15.7* 7.2 5.1 8.6 10.9* 11.3*  NEUTROABS 12.1*  --   --   --   --   --   HGB 8.1* 8.1* 7.4* 9.6* 9.8* 9.1*  HCT 25.6* 25.6* 24.0* 30.1* 32.2* 30.2*  MCV 84.5 85.3 85.4 83.8  85.0 86.0  PLT 346 260 271 318 297 293   Basic Metabolic Panel:  Recent Labs Lab 04/09/17 2342 04/10/17 0008 04/11/17 0526 04/11/17 2300 04/12/17 0258 04/13/17 0833 04/14/17 0325  NA  --  141 149*  --  146* 148* 148*  K  --  3.7 3.3* 3.6 3.8 3.8 3.7  CL  --  109 117*  --  109 110 109  CO2  --  22 24  --  27 30 33*  GLUCOSE  --  88 117*  --  103* 138* 164*  BUN  --  31* 16  --  15 22* 26*  CREATININE  --  0.76 0.98  --  0.76 0.86 0.84  CALCIUM  --  8.5* 8.2*  --  9.1 9.1 9.1  MG 1.8  --   --  1.6* 1.6* 1.6* 2.2  PHOS 2.4*  --   --   --   --   --   --    GFR: Estimated Creatinine Clearance: 77.6 mL/min (by C-G formula based on SCr of 0.84 mg/dL).  Liver Function Tests:  Recent Labs Lab 04/09/17 0443 04/10/17 0008  AST 28 29  ALT 16* 16*  ALKPHOS 78 66  BILITOT 0.4 0.5  PROT 6.7 6.0*  ALBUMIN 2.8* 2.7*    HbA1C: Hgb A1c MFr Bld  Date/Time Value Ref Range Status  04/09/2017 09:10 AM 4.5 (L) 4.8 - 5.6 % Final    Comment:    (NOTE)         Pre-diabetes: 5.7 - 6.4         Diabetes: >6.4         Glycemic control for adults with diabetes: <7.0     CBG:  Recent Labs Lab 04/13/17 1626 04/13/17 1955 04/13/17 2354 04/14/17 0329 04/14/17 0823  GLUCAP 130* 92 123* 154* 96    Recent Results (from the past 240 hour(s))  Blood Culture (routine x 2)     Status: None (Preliminary result)   Collection Time: 04/09/17  4:30 AM  Result Value Ref Range Status   Specimen Description BLOOD RIGHT ARM  Final   Special Requests   Final    BOTTLES DRAWN AEROBIC AND ANAEROBIC Blood Culture adequate volume   Culture NO GROWTH 4 DAYS  Final   Report Status PENDING  Incomplete  Blood Culture (routine x 2)     Status: None (Preliminary result)   Collection Time: 04/09/17  4:45 AM  Result Value Ref Range Status   Specimen Description BLOOD LEFT ARM  Final   Special Requests IN PEDIATRIC BOTTLE Blood Culture adequate volume  Final   Culture NO  GROWTH 4 DAYS  Final   Report  Status PENDING  Incomplete  Culture, Urine     Status: Abnormal   Collection Time: 04/09/17  4:50 AM  Result Value Ref Range Status   Specimen Description URINE, CATHETERIZED  Final   Special Requests NONE  Final   Culture >=100,000 COLONIES/mL PROTEUS MIRABILIS (A)  Final   Report Status 04/11/2017 FINAL  Final   Organism ID, Bacteria PROTEUS MIRABILIS (A)  Final      Susceptibility   Proteus mirabilis - MIC*    AMPICILLIN <=2 SENSITIVE Sensitive     CEFAZOLIN <=4 SENSITIVE Sensitive     CEFTRIAXONE <=1 SENSITIVE Sensitive     CIPROFLOXACIN >=4 RESISTANT Resistant     GENTAMICIN <=1 SENSITIVE Sensitive     IMIPENEM 1 SENSITIVE Sensitive     NITROFURANTOIN 128 RESISTANT Resistant     TRIMETH/SULFA <=20 SENSITIVE Sensitive     AMPICILLIN/SULBACTAM <=2 SENSITIVE Sensitive     PIP/TAZO <=4 SENSITIVE Sensitive     * >=100,000 COLONIES/mL PROTEUS MIRABILIS  Respiratory Panel by PCR     Status: None   Collection Time: 04/09/17  9:08 AM  Result Value Ref Range Status   Adenovirus NOT DETECTED NOT DETECTED Final   Coronavirus 229E NOT DETECTED NOT DETECTED Final   Coronavirus HKU1 NOT DETECTED NOT DETECTED Final   Coronavirus NL63 NOT DETECTED NOT DETECTED Final   Coronavirus OC43 NOT DETECTED NOT DETECTED Final   Metapneumovirus NOT DETECTED NOT DETECTED Final   Rhinovirus / Enterovirus NOT DETECTED NOT DETECTED Final   Influenza A NOT DETECTED NOT DETECTED Final   Influenza B NOT DETECTED NOT DETECTED Final   Parainfluenza Virus 1 NOT DETECTED NOT DETECTED Final   Parainfluenza Virus 2 NOT DETECTED NOT DETECTED Final   Parainfluenza Virus 3 NOT DETECTED NOT DETECTED Final   Parainfluenza Virus 4 NOT DETECTED NOT DETECTED Final   Respiratory Syncytial Virus NOT DETECTED NOT DETECTED Final   Bordetella pertussis NOT DETECTED NOT DETECTED Final   Chlamydophila pneumoniae NOT DETECTED NOT DETECTED Final   Mycoplasma pneumoniae NOT DETECTED NOT DETECTED Final  Culture, respiratory  (NON-Expectorated)     Status: Abnormal   Collection Time: 04/09/17  9:22 AM  Result Value Ref Range Status   Specimen Description TRACHEAL ASPIRATE  Final   Special Requests NONE  Final   Gram Stain   Final    FEW WBC PRESENT,BOTH PMN AND MONONUCLEAR RARE SQUAMOUS EPITHELIAL CELLS PRESENT MODERATE GRAM POSITIVE RODS FEW GRAM POSITIVE COCCI IN PAIRS RARE GRAM NEGATIVE RODS RARE YEAST    Culture MULTIPLE ORGANISMS PRESENT, NONE PREDOMINANT (A)  Final   Report Status 04/11/2017 FINAL  Final  MRSA PCR Screening     Status: Abnormal   Collection Time: 04/10/17  3:48 AM  Result Value Ref Range Status   MRSA by PCR INVALID RESULTS, SPECIMEN SENT FOR CULTURE (A) NEGATIVE Final    Comment: RESULT CALLED TO, READ BACK BY AND VERIFIED WITH: W.DAVIS RN AT 0754 04/10/17 BY A.DAVIS        The GeneXpert MRSA Assay (FDA approved for NASAL specimens only), is one component of a comprehensive MRSA colonization surveillance program. It is not intended to diagnose MRSA infection nor to guide or monitor treatment for MRSA infections.   MRSA culture     Status: None   Collection Time: 04/10/17  3:48 AM  Result Value Ref Range Status   Specimen Description NASAL SWAB  Final   Special Requests NONE  Final  Culture NO MRSA DETECTED  Final   Report Status 04/10/2017 FINAL  Final     Scheduled Meds: . amoxicillin-clavulanate  1 tablet Per Tube Q12H  . chlorhexidine  15 mL Mouth/Throat BID  . diazepam  2 mg Per Tube BID  . enoxaparin (LOVENOX) injection  40 mg Subcutaneous QHS  . famotidine  20 mg Per Tube BID  . feeding supplement (PRO-STAT SUGAR FREE 64)  60 mL Per Tube Daily  . free water  250 mL Per Tube Q6H  . insulin aspart  0-9 Units Subcutaneous Q4H  . lacosamide  100 mg Per Tube BID  . levETIRAcetam  1,000 mg Per Tube BID  . midodrine  10 mg Per Tube TID WC  . multivitamin with minerals  1 tablet Per Tube Daily  . phenazopyridine  200 mg Per Tube BID  . saccharomyces boulardii   250 mg Per Tube BID  . sodium chloride flush  3 mL Intravenous Q12H  . traMADol  50 mg Per Tube BID     LOS: 5 days   Lonia Blood, MD [facility] Office  [phone] Pager - Text Page per Loretha Stapler as per below:  On-Call/Text Page:      [email or web address]      password [email or web address]  If 7PM-7AM, please contact night-coverage [email or web address] Password [email or web address] 04/14/2017, 10:19 AM

## 2017-04-15 LAB — BASIC METABOLIC PANEL
ANION GAP: 5 (ref 5–15)
BUN: 25 mg/dL — ABNORMAL HIGH (ref 6–20)
CO2: 32 mmol/L (ref 22–32)
Calcium: 9.2 mg/dL (ref 8.9–10.3)
Chloride: 113 mmol/L — ABNORMAL HIGH (ref 101–111)
Creatinine, Ser: 0.77 mg/dL (ref 0.61–1.24)
GFR calc Af Amer: >60 mL/min (ref >60)
GLUCOSE: 100 mg/dL — AB (ref 65–99)
POTASSIUM: 4.1 mmol/L (ref 3.5–5.1)
Sodium: 150 mmol/L — ABNORMAL HIGH (ref 135–145)

## 2017-04-15 LAB — CBC
HEMATOCRIT: 30.3 % — AB (ref 39.0–52.0)
HEMOGLOBIN: 9 g/dL — AB (ref 13.0–17.0)
MCH: 25.8 pg — ABNORMAL LOW (ref 26.0–34.0)
MCHC: 29.7 g/dL — AB (ref 30.0–36.0)
MCV: 86.8 fL (ref 78.0–100.0)
Platelets: 304 10*3/uL (ref 150–400)
RBC: 3.49 MIL/uL — ABNORMAL LOW (ref 4.22–5.81)
RDW: 16.8 % — ABNORMAL HIGH (ref 11.5–15.5)
WBC: 11.2 10*3/uL — ABNORMAL HIGH (ref 4.0–10.5)

## 2017-04-15 LAB — GLUCOSE, CAPILLARY
GLUCOSE-CAPILLARY: 122 mg/dL — AB (ref 65–99)
GLUCOSE-CAPILLARY: 101 mg/dL — AB (ref 65–99)
GLUCOSE-CAPILLARY: 133 mg/dL — AB (ref 65–99)
Glucose-Capillary: 101 mg/dL — ABNORMAL HIGH (ref 65–99)

## 2017-04-15 MED ORDER — MIDODRINE HCL 10 MG PO TABS: 10.0000 mg | ORAL_TABLET | Freq: Three times a day (TID) | ORAL | Status: DC

## 2017-04-15 MED ORDER — DIAZEPAM 2 MG PO TABS: 2.0000 mg | ORAL_TABLET | Freq: Two times a day (BID) | ORAL | 0 refills | Status: DC

## 2017-04-15 MED ORDER — PRO-STAT SUGAR FREE PO LIQD: 60.0000 mL | Freq: Every day | ORAL | 0 refills | Status: DC

## 2017-04-15 MED ORDER — INSULIN ASPART 100 UNIT/ML ~~LOC~~ SOLN: 0.0000 [IU] | SUBCUTANEOUS | 11 refills | Status: DC

## 2017-04-15 MED ORDER — ADULT MULTIVITAMIN W/MINERALS CH: 1.0000 | ORAL_TABLET | Freq: Every day | ORAL | Status: AC

## 2017-04-15 MED ORDER — LEVETIRACETAM 100 MG/ML PO SOLN: 1000.0000 mg | Freq: Two times a day (BID) | ORAL | 12 refills | Status: DC

## 2017-04-15 MED ORDER — ENOXAPARIN SODIUM 40 MG/0.4ML ~~LOC~~ SOLN: 40.0000 mg | Freq: Every day | SUBCUTANEOUS | Status: DC

## 2017-04-15 MED ORDER — TRAMADOL HCL 50 MG PO TABS: 50.0000 mg | ORAL_TABLET | Freq: Two times a day (BID) | ORAL | 0 refills | Status: DC

## 2017-04-15 MED ORDER — ACETAMINOPHEN 325 MG PO TABS: 650.0000 mg | ORAL_TABLET | Freq: Four times a day (QID) | ORAL | Status: AC | PRN

## 2017-04-15 MED ORDER — OSMOLITE 1.5 CAL PO LIQD: 1000.0000 mL | ORAL | 0 refills | Status: DC

## 2017-04-15 MED ORDER — FREE WATER: 350.0000 mL | Freq: Four times a day (QID) | Status: DC

## 2017-04-15 MED ORDER — ONDANSETRON HCL 4 MG PO TABS: 4.0000 mg | ORAL_TABLET | Freq: Four times a day (QID) | ORAL | 0 refills | Status: AC | PRN

## 2017-04-15 MED ORDER — FREE WATER
350.0000 mL | Freq: Four times a day (QID) | Status: DC
  Administered 2017-04-15: 350 mL

## 2017-04-15 MED ORDER — FAMOTIDINE 20 MG PO TABS: 20.0000 mg | ORAL_TABLET | Freq: Two times a day (BID) | ORAL | Status: DC

## 2017-04-15 MED ORDER — LACOSAMIDE 100 MG PO TABS: 100.0000 mg | ORAL_TABLET | Freq: Two times a day (BID) | ORAL | Status: DC

## 2017-04-15 MED ORDER — SACCHAROMYCES BOULARDII 250 MG PO CAPS: 250.0000 mg | ORAL_CAPSULE | Freq: Two times a day (BID) | ORAL | Status: AC

## 2017-04-15 MED ORDER — CHLORHEXIDINE GLUCONATE 0.12 % MT SOLN: 15.0000 mL | Freq: Two times a day (BID) | OROMUCOSAL | 0 refills | Status: AC

## 2017-04-15 NOTE — Clinical Social Work Note (Signed)
Clinical Social Worker facilitated patient discharge including contacting patient family and facility to confirm patient discharge plans.  Clinical information faxed to facility and family agreeable with plan.  CSW arranged ambulance transport via [facility] to [facility].  RN to call report prior to discharge.  Clinical Social Worker will sign off for now as social work intervention is no longer needed. Please consult [place] again if new need arises.  Macario Golds, [place] [phone]

## 2017-04-15 NOTE — Progress Notes (Signed)
Discharged to [facility] by ambulance, stable. No belongings. Discharge summary , instructions and prescription given to ambulance staff.

## 2017-04-15 NOTE — Discharge Summary (Signed)
DISCHARGE SUMMARY  Dean Graham  MR#: [id number]  DOB:1960-10-14  Date of Admission: 04/09/2017 Date of Discharge: 04/15/2017  Attending Physician:MCCLUNG,JEFFREY T  Patient's WUJ:[id number], Anne Hahn, MD  Consults:  ID  Disposition: D/C to SNF   Follow-up Appts: Follow-up Information    Hillary Bow, MD Follow up in 2 day(s).   Specialty:  Internal Medicine Contact information: [address] 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 Joseph Art - WOC prescribed ongoing wound care   Acute kidney injury Resolved with volume expansion - crt normal at time of d/c   History of traumatic brain injury with quadriplegia return to SNF at time of d/c   ?Neurogenic bladder - chronic indwelling Foley catheter Foley was removed during this hospital stay - no evidence of retention w/ good consistent UOP using condom cath - will need to be monitored in SNF    Tracheostomy dependence/chronic hypoxic respiratory failure Weaning O2 as able via TC - manage trach secretions   PEG tube dependent tolerating tube feeding w/o difficulty at time of d/c - providing free water via PEG  History of recurrent aspiration pneumonitis  Chronic diastolic congestive heart failure euvolemic to slightly DH at time of d/c   DM 2 CBG well controlled  Chronic hypotension On chronic midodrine - BP stable   Chronic paroxysmal atrial fibrillation NSR alternating with some tachycardia - this may be related to anxiety w/ decreasing dose of benzo - follow  Seizure disorder Possible seizure noted on 8/2 - EEG w/o evidence of seizure at time of test    Allergies as of 04/15/2017      Reactions   Ativan [lorazepam] Other (See Comments)   unknown   Azithromycin Other (See Comments)   Blisters all over body   Ceftriaxone Other (  See Comments)   Blisters all over body   Vancomycin Other (See Comments)   Blisters all over body      Medication List    STOP taking these medications   furosemide 20 MG tablet Commonly known as:  LASIX   levofloxacin 750 MG tablet Commonly known as:   LEVAQUIN   OCUSOFT LID SCRUB Pads   phenazopyridine 200 MG tablet Commonly known as:  PYRIDIUM   vitamin C 500 MG tablet Commonly known as:  ASCORBIC ACID     TAKE these medications   acetaminophen 325 MG tablet Commonly known as:  TYLENOL Place 2 tablets (650 mg total) into feeding tube every 6 (six) hours as needed for mild pain (or Fever >/= 101). What changed:  reasons to take this   chlorhexidine 0.12 % solution Commonly known as:  PERIDEX Use as directed 15 mLs in the mouth or throat 2 (two) times daily.   diazepam 2 MG tablet Commonly known as:  VALIUM Place 1 tablet (2 mg total) into feeding tube 2 (two) times daily. What changed:  medication strength  how much to take   enoxaparin 40 MG/0.4ML injection Commonly known as:  LOVENOX Inject 0.4 mLs (40 mg total) into the skin at bedtime.   famotidine 20 MG tablet Commonly known as:  PEPCID Place 1 tablet (20 mg total) into feeding tube 2 (two) times daily.   feeding supplement (OSMOLITE 1.5 CAL) Liqd Place 1,000 mLs into feeding tube continuous. What changed:  additional instructions   feeding supplement (PRO-STAT SUGAR FREE 64) Liqd Place 60 mLs into feeding tube daily. What changed:  how much to take  how to take this   free water Soln Place 350 mLs into feeding tube every 6 (six) hours. What changed:  how much to take  when to take this   insulin aspart 100 UNIT/ML injection Commonly known as:  novoLOG Inject 0-9 Units into the skin every 4 (four) hours.   Lacosamide 100 MG Tabs Place 1 tablet (100 mg total) into feeding tube 2 (two) times daily. What changed:  Another medication with the same name was removed. Continue taking this medication, and follow the directions you see here.   levETIRAcetam 100 MG/ML solution Commonly known as:  KEPPRA Place 10 mLs (1,000 mg total) into feeding tube 2 (two) times daily. What changed:  how to take this   midodrine 10 MG tablet Commonly known as:   PROAMATINE Place 1 tablet (10 mg total) into feeding tube 3 (three) times daily with meals. What changed:  when to take this  reasons to take this   multivitamin with minerals Tabs tablet Place 1 tablet into feeding tube daily.   ondansetron 4 MG tablet Commonly known as:  ZOFRAN Place 1 tablet (4 mg total) into feeding tube every 6 (six) hours as needed for nausea.   saccharomyces boulardii 250 MG capsule Commonly known as:  FLORASTOR Place 1 capsule (250 mg total) into feeding tube 2 (two) times daily. What changed:  how to take this  additional instructions   traMADol 50 MG tablet Commonly known as:  ULTRAM Place 1 tablet (50 mg total) into feeding tube 2 (two) times daily. What changed:  how to take this       Day of Discharge BP 101/71 (BP Location: Right Arm)   Pulse (!) 116   Temp 98.5 F (36.9 C) (Axillary)   Resp (!) 29   Ht 5\' 10"  (1.778 m)   Wt 58.7 kg (  129 lb 6.4 oz)   SpO2 96%   BMI 18.57 kg/m   Physical Exam: General: No acute respiratory distress at rest in bed on TC Lungs: Clear to auscultation bilaterally without wheezes or crackles  Cardiovascular: Tachycardic but regular and 110 bpm Abdomen: Nontender, nondistended, soft, bowel sounds positive, no rebound, no ascites, no appreciable mass - PEG insertion clean and dry Extremities: No significant cyanosis, clubbing, or edema bilateral lower extremities - legs flexed/contracted  Basic Metabolic Panel:  Recent Labs Lab 04/09/17 2342  04/11/17 0526 04/11/17 2300 04/12/17 0258 04/13/17 0833 04/14/17 0325 04/15/17 0302  NA  --   < > 149*  --  146* 148* 148* 150*  K  --   < > 3.3* 3.6 3.8 3.8 3.7 4.1  CL  --   < > 117*  --  109 110 109 113*  CO2  --   < > 24  --  27 30 33* 32  GLUCOSE  --   < > 117*  --  103* 138* 164* 100*  BUN  --   < > 16  --  15 22* 26* 25*  CREATININE  --   < > 0.98  --  0.76 0.86 0.84 0.77  CALCIUM  --   < > 8.2*  --  9.1 9.1 9.1 9.2  MG 1.8  --   --  1.6* 1.6*  1.6* 2.2  --   PHOS 2.4*  --   --   --   --   --   --   --   < > = values in this interval not displayed.  Liver Function Tests:  Recent Labs Lab 04/09/17 0443 04/10/17 0008  AST 28 29  ALT 16* 16*  ALKPHOS 78 66  BILITOT 0.4 0.5  PROT 6.7 6.0*  ALBUMIN 2.8* 2.7*    CBC:  Recent Labs Lab 04/09/17 0443  04/11/17 0526 04/12/17 0258 04/13/17 0833 04/14/17 0325 04/15/17 0302  WBC 15.7*  < > 5.1 8.6 10.9* 11.3* 11.2*  NEUTROABS 12.1*  --   --   --   --   --   --   HGB 8.1*  < > 7.4* 9.6* 9.8* 9.1* 9.0*  HCT 25.6*  < > 24.0* 30.1* 32.2* 30.2* 30.3*  MCV 84.5  < > 85.4 83.8 85.0 86.0 86.8  PLT 346  < > 271 318 297 293 304  < > = values in this interval not displayed.   CBG:  Recent Labs Lab 04/14/17 1612 04/14/17 1928 04/14/17 2258 04/15/17 0306 04/15/17 0758  GLUCAP 93 125* 104* 101* 122*    Recent Results (from the past 240 hour(s))  Blood Culture (routine x 2)     Status: None   Collection Time: 04/09/17  4:30 AM  Result Value Ref Range Status   Specimen Description BLOOD RIGHT ARM  Final   Special Requests   Final    BOTTLES DRAWN AEROBIC AND ANAEROBIC Blood Culture adequate volume   Culture NO GROWTH 5 DAYS  Final   Report Status 04/14/2017 FINAL  Final  Blood Culture (routine x 2)     Status: None   Collection Time: 04/09/17  4:45 AM  Result Value Ref Range Status   Specimen Description BLOOD LEFT ARM  Final   Special Requests IN PEDIATRIC BOTTLE Blood Culture adequate volume  Final   Culture NO GROWTH 5 DAYS  Final   Report Status 04/14/2017 FINAL  Final  Culture, Urine     Status: Abnormal  Collection Time: 04/09/17  4:50 AM  Result Value Ref Range Status   Specimen Description URINE, CATHETERIZED  Final   Special Requests NONE  Final   Culture >=100,000 COLONIES/mL PROTEUS MIRABILIS (A)  Final   Report Status 04/11/2017 FINAL  Final   Organism ID, Bacteria PROTEUS MIRABILIS (A)  Final      Susceptibility   Proteus mirabilis - MIC*     AMPICILLIN <=2 SENSITIVE Sensitive     CEFAZOLIN <=4 SENSITIVE Sensitive     CEFTRIAXONE <=1 SENSITIVE Sensitive     CIPROFLOXACIN >=4 RESISTANT Resistant     GENTAMICIN <=1 SENSITIVE Sensitive     IMIPENEM 1 SENSITIVE Sensitive     NITROFURANTOIN 128 RESISTANT Resistant     TRIMETH/SULFA <=20 SENSITIVE Sensitive     AMPICILLIN/SULBACTAM <=2 SENSITIVE Sensitive     PIP/TAZO <=4 SENSITIVE Sensitive     * >=100,000 COLONIES/mL PROTEUS MIRABILIS  Respiratory Panel by PCR     Status: None   Collection Time: 04/09/17  9:08 AM  Result Value Ref Range Status   Adenovirus NOT DETECTED NOT DETECTED Final   Coronavirus 229E NOT DETECTED NOT DETECTED Final   Coronavirus HKU1 NOT DETECTED NOT DETECTED Final   Coronavirus NL63 NOT DETECTED NOT DETECTED Final   Coronavirus OC43 NOT DETECTED NOT DETECTED Final   Metapneumovirus NOT DETECTED NOT DETECTED Final   Rhinovirus / Enterovirus NOT DETECTED NOT DETECTED Final   Influenza A NOT DETECTED NOT DETECTED Final   Influenza B NOT DETECTED NOT DETECTED Final   Parainfluenza Virus 1 NOT DETECTED NOT DETECTED Final   Parainfluenza Virus 2 NOT DETECTED NOT DETECTED Final   Parainfluenza Virus 3 NOT DETECTED NOT DETECTED Final   Parainfluenza Virus 4 NOT DETECTED NOT DETECTED Final   Respiratory Syncytial Virus NOT DETECTED NOT DETECTED Final   Bordetella pertussis NOT DETECTED NOT DETECTED Final   Chlamydophila pneumoniae NOT DETECTED NOT DETECTED Final   Mycoplasma pneumoniae NOT DETECTED NOT DETECTED Final  Culture, respiratory (NON-Expectorated)     Status: Abnormal   Collection Time: 04/09/17  9:22 AM  Result Value Ref Range Status   Specimen Description TRACHEAL ASPIRATE  Final   Special Requests NONE  Final   Gram Stain   Final    FEW WBC PRESENT,BOTH PMN AND MONONUCLEAR RARE SQUAMOUS EPITHELIAL CELLS PRESENT MODERATE GRAM POSITIVE RODS FEW GRAM POSITIVE COCCI IN PAIRS RARE GRAM NEGATIVE RODS RARE YEAST    Culture MULTIPLE ORGANISMS  PRESENT, NONE PREDOMINANT (A)  Final   Report Status 04/11/2017 FINAL  Final  MRSA PCR Screening     Status: Abnormal   Collection Time: 04/10/17  3:48 AM  Result Value Ref Range Status   MRSA by PCR INVALID RESULTS, SPECIMEN SENT FOR CULTURE (A) NEGATIVE Final    Comment: RESULT CALLED TO, READ BACK BY AND VERIFIED WITH: W.DAVIS RN AT 0754 04/10/17 BY A.DAVIS        The GeneXpert MRSA Assay (FDA approved for NASAL specimens only), is one component of a comprehensive MRSA colonization surveillance program. It is not intended to diagnose MRSA infection nor to guide or monitor treatment for MRSA infections.   MRSA culture     Status: None   Collection Time: 04/10/17  3:48 AM  Result Value Ref Range Status   Specimen Description NASAL SWAB  Final   Special Requests NONE  Final   Culture NO MRSA DETECTED  Final   Report Status 04/10/2017 FINAL  Final     Time spent in discharge (  includes decision making & examination of pt): 35 minutes  04/15/2017, 11:42 AM   Lonia Blood, MD [facility] Office  [phone] Pager [phone]  On-Call/Text Page:      [email or web address]      password [facility]

## 2017-04-15 NOTE — Care Management Important Message (Signed)
Important Message  Patient Details  Name: Kroy Saffle MRN: [id number] Date of Birth: 30-Nov-1960   Medicare Important Message Given:  Yes    Cherylann Parr, RN 04/15/2017, 3:48 PM

## 2017-04-15 NOTE — Progress Notes (Signed)
Report given to [facility] nurse.

## 2017-06-13 ENCOUNTER — Encounter (HOSPITAL_COMMUNITY): Payer: Self-pay | Admitting: Pharmacy Technician

## 2017-06-13 ENCOUNTER — Emergency Department (HOSPITAL_COMMUNITY)
Admission: EM | Admit: 2017-06-13 | Discharge: 2017-06-13 | Disposition: A | Discharge status: Home / Self Care | Payer: Medicare Other | Attending: Emergency Medicine | Admitting: Emergency Medicine

## 2017-06-13 ENCOUNTER — Emergency Department (HOSPITAL_COMMUNITY): Payer: Medicare Other

## 2017-06-13 DIAGNOSIS — I5032 Chronic diastolic (congestive) heart failure: Secondary | ICD-10-CM | POA: Diagnosis not present

## 2017-06-13 DIAGNOSIS — Z794 Long term (current) use of insulin: Secondary | ICD-10-CM | POA: Diagnosis not present

## 2017-06-13 DIAGNOSIS — I110 Hypertensive heart disease with heart failure: Secondary | ICD-10-CM | POA: Diagnosis not present

## 2017-06-13 DIAGNOSIS — E119 Type 2 diabetes mellitus without complications: Secondary | ICD-10-CM | POA: Insufficient documentation

## 2017-06-13 DIAGNOSIS — Z79899 Other long term (current) drug therapy: Secondary | ICD-10-CM | POA: Diagnosis not present

## 2017-06-13 DIAGNOSIS — Z431 Encounter for attention to gastrostomy: Secondary | ICD-10-CM | POA: Diagnosis not present

## 2017-06-13 DIAGNOSIS — Z9911 Dependence on respirator [ventilator] status: Secondary | ICD-10-CM | POA: Diagnosis not present

## 2017-06-13 DIAGNOSIS — Z931 Gastrostomy status: Secondary | ICD-10-CM

## 2017-06-13 MED ORDER — IOPAMIDOL (ISOVUE-300) INJECTION 61%
INTRAVENOUS | Status: AC
  Administered 2017-06-13: 40 mL via GASTROSTOMY
  Filled 2017-06-13: qty 50

## 2017-06-13 NOTE — ED Notes (Signed)
Pt suctioned due to excess secretions

## 2017-06-13 NOTE — ED Triage Notes (Signed)
Pt arrives via [facility] from [facility] with reports of needing GTUBE replaced. Pt VSS with EMS. Pt does have a trach.

## 2017-06-13 NOTE — ED Provider Notes (Signed)
[facility]-EMERGENCY DEPT Provider Note   CSN: [id number] Arrival date & time:        History   Chief Complaint No chief complaint on file.   HPI Dean Graham is a 56 y.o. male.  Patient is a 56 year old male with multiple medical problems including being vent dependent due to quadriplegia, seizures, diabetes, atrial fibrillation, CHF, all complications related to being trached and quadriplegic who gets all of his nutrition through G-tube presents today from nursing facility due to his PEG tube falling out. No other complaints at this time. They did not bring the tube with him that it was reported that it did not come out until this morning.   The history is provided by the EMS personnel and the nursing home. The history is limited by the condition of the patient.    Past Medical History:  Diagnosis Date  . Atrial fibrillation (HCC)   . CHF (congestive heart failure) (HCC)   . Chronic respiratory failure (HCC)   . Diabetes mellitus without complication (HCC)   . Diastolic heart failure (HCC)   . Dysphagia   . GERD (gastroesophageal reflux disease)   . Quadriplegia (HCC)   . Seizures [facility])     Patient Active Problem List   Diagnosis Date Noted  . Chronic diastolic heart failure (HCC) 04/09/2017  . GERD (gastroesophageal reflux disease) 04/09/2017  . Atrial fibrillation, currently in sinus rhythm 04/09/2017  . Tracheostomy in place [facility]) 04/09/2017  . Chronic respiratory failure with hypoxia (HCC) 04/09/2017  . 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 Diabetes mellitus type 2, diet-controlled (HCC) 04/09/2017  . Chronic hypotension 04/09/2017  . H/O quadriplegia 04/09/2017  . Acute hypoxemic respiratory failure (HCC)   . Ventilator dependent (HCC)   . 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 Fever   . Leukocytosis   . Aspiration pneumonia (HCC) 01/04/2017  . Anasarca   . Hemoptysis   . Tracheostomy care (HCC)   .  Hypoalbuminemia   . Cough with frothy sputum   . VAP (ventilator-associated pneumonia) (HCC)   . Carbapenem-resistant Acinetobacter baumannii infection   . History of infection by MDR Stenotrophomonas maltophilia   . History of MDR Pseudomonas aeruginosa infection   . Chronic post traumatic encephalopathy   . Acute encephalopathy   . Hypokalemia   . Hypomagnesemia   . Urinary tract infection, site not specified   . Acute on chronic respiratory failure with hypoxia (HCC)   . Dysphagia   . Decubitus ulcer of sacral area   . Encounter for wound care   . Protein-calorie malnutrition, severe (HCC)   . Palliative care encounter   . Goals of care, counseling/discussion   . Pressure injury of skin 05/31/2016  . Quadriplegia (HCC) 05/31/2016  . Atrial fibrillation (HCC) 05/31/2016  . Diabetes mellitus without complication (HCC) 05/31/2016  . Chronic respiratory failure (HCC) 05/31/2016  . 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 Septic shock (HCC) 05/29/2016  . HCAP (healthcare-associated pneumonia)     Past Surgical History:  Procedure Laterality Date  . IR GASTROSTOMY TUBE MOD SED  01/11/2017       Home Medications    Prior to Admission medications   Medication Sig Start Date End Date Taking? Authorizing Provider  acetaminophen (TYLENOL) 325 MG tablet Place 2 tablets (650 mg total) into feeding tube every 6 (six) hours as needed for mild pain (or Fever >/= 101). 04/15/17   Lonia Blood, MD  Amino Acids-Protein  Hydrolys (FEEDING SUPPLEMENT, PRO-STAT SUGAR FREE 64,) LIQD Place 60 mLs into feeding tube daily. 04/16/17   Lonia Blood, MD  chlorhexidine (PERIDEX) 0.12 % solution Use as directed 15 mLs in the mouth or throat 2 (two) times daily. 04/15/17   Lonia Blood, MD  diazepam (VALIUM) 2 MG tablet Place 1 tablet (2 mg total) into feeding tube 2 (two) times daily. 04/15/17   Lonia Blood, MD  enoxaparin (LOVENOX) 40 MG/0.4ML injection Inject 0.4 mLs (40 mg total) into  the skin at bedtime. 04/15/17   Lonia Blood, MD  famotidine (PEPCID) 20 MG tablet Place 1 tablet (20 mg total) into feeding tube 2 (two) times daily. 04/15/17   Lonia Blood, MD  insulin aspart (NOVOLOG) 100 UNIT/ML injection Inject 0-9 Units into the skin every 4 (four) hours. 04/15/17   Lonia Blood, MD  lacosamide 100 MG TABS Place 1 tablet (100 mg total) into feeding tube 2 (two) times daily. 04/15/17   Lonia Blood, MD  levETIRAcetam (KEPPRA) 100 MG/ML solution Place 10 mLs (1,000 mg total) into feeding tube 2 (two) times daily. 04/15/17   Lonia Blood, MD  midodrine (PROAMATINE) 10 MG tablet Place 1 tablet (10 mg total) into feeding tube 3 (three) times daily with meals. 04/15/17   Lonia Blood, MD  Multiple Vitamin (MULTIVITAMIN WITH MINERALS) TABS tablet Place 1 tablet into feeding tube daily. 04/16/17   Lonia Blood, MD  Nutritional Supplements (FEEDING SUPPLEMENT, OSMOLITE 1.5 CAL,) LIQD Place 1,000 mLs into feeding tube continuous. 04/15/17   Lonia Blood, MD  ondansetron (ZOFRAN) 4 MG tablet Place 1 tablet (4 mg total) into feeding tube every 6 (six) hours as needed for nausea. 04/15/17   Lonia Blood, MD  saccharomyces boulardii (FLORASTOR) 250 MG capsule Place 1 capsule (250 mg total) into feeding tube 2 (two) times daily. 04/15/17   Lonia Blood, MD  traMADol (ULTRAM) 50 MG tablet Place 1 tablet (50 mg total) into feeding tube 2 (two) times daily. 04/15/17   Lonia Blood, MD  Water For Irrigation, Sterile (FREE WATER) SOLN Place 350 mLs into feeding tube every 6 (six) hours. 04/15/17   Lonia Blood, MD    Family History No family history on file.  Social History Social History  Substance Use Topics  . Smoking status: Never Smoker  . Smokeless tobacco: Never Used  . Alcohol use No     Allergies   Ativan [lorazepam]; Azithromycin; Ceftriaxone; and Vancomycin   Review of Systems Review of Systems  Unable to perform ROS:  Other (Patient is trach dependent, quadriplegic and baseline minimal verbal response)     Physical Exam Updated Vital Signs Pulse (!) 122   Temp (!) 97.5 F (36.4 C) (Oral)   Resp 18   SpO2 100%   Physical Exam  Constitutional: He is oriented to person, place, and time. He appears well-developed and well-nourished. No distress.  HENT:  Head: Normocephalic and atraumatic.  Mouth/Throat: Oropharynx is clear and moist.  Eyes: Pupils are equal, round, and reactive to light. Conjunctivae and EOM are normal.  Neck: Normal range of motion. Neck supple.  Janina Mayo is present with minimal discharge  Cardiovascular: Regular rhythm and intact distal pulses.  Tachycardia present.   No murmur heard. Pulmonary/Chest: Effort normal and breath sounds normal. No respiratory distress.  Course breath sounds throughout  Abdominal: Soft. He exhibits no distension. There is no tenderness. There is no rebound and no guarding.  PEG tube site without drainage  Musculoskeletal: He exhibits no edema or tenderness.  Contractures of the upper and lower extremities consistent with quadriplegia  Neurological: He is alert and oriented to person, place, and time.  Skin: Skin is warm and dry. No rash noted. No erythema.  Psychiatric: He has a normal mood and affect. His behavior is normal.  Nursing note and vitals reviewed.    ED Treatments / Results  Labs (all labs ordered are listed, but only abnormal results are displayed) Labs Reviewed - No data to display  EKG  EKG Interpretation None       Radiology No results found.  Procedures FEEDING TUBE REPLACEMENT Date/Time: 06/13/2017 3:21 PM Performed by: Gwyneth Sprout Authorized by: Gwyneth Sprout  Consent: The procedure was performed in an emergent situation. Verbal consent not obtained. Patient identity confirmed: verbally with patient Time out: Immediately prior to procedure a "time out" was called to verify the correct patient, procedure,  equipment, support staff and site/side marked as required. Indications: tube dislodged Local anesthesia used: no  Anesthesia: Local anesthesia used: no  Sedation: Patient sedated: no Tube type: gastrostomy Patient position: supine Procedure type: replacement Tube size: 18 Fr Endoscope used: no Bulb inflation volume: 9 (ml) Bulb inflation fluid: sterile water Placement/position confirmation: x-ray Tube placement difficulty: minimal Patient tolerance: Patient tolerated the procedure well with no immediate complications    (including critical care time)  Medications Ordered in ED Medications - No data to display   Initial Impression / Assessment and Plan / ED Course  I have reviewed the triage vital signs and the nursing notes.  Pertinent labs & imaging results that were available during my care of the patient were reviewed by me and considered in my medical decision making (see chart for details).    Patient presenting today because his PEG tube fell out. Patient has an 10 French PEG tube which was last placed in May by interventional radiology due to his tract closing. Able to place a Foley catheter temporarily until the tube was available. 56 French G-tube placed and placement was confirmed. Patient otherwise appears stable income be transported back to his facility.   Final Clinical Impressions(s) / ED Diagnoses   Final diagnoses:  Status post insertion of percutaneous endoscopic gastrostomy (PEG) tube [facility])    New Prescriptions New Prescriptions   No medications on file     Gwyneth Sprout, MD 06/13/17 1527

## 2017-08-05 ENCOUNTER — Emergency Department (HOSPITAL_COMMUNITY): Payer: Medicare Other

## 2017-08-05 ENCOUNTER — Other Ambulatory Visit: Payer: Self-pay

## 2017-08-05 ENCOUNTER — Encounter (HOSPITAL_COMMUNITY): Payer: Self-pay | Admitting: Emergency Medicine

## 2017-08-05 ENCOUNTER — Inpatient Hospital Stay (HOSPITAL_COMMUNITY)
Admission: EM | Admit: 2017-08-05 | Discharge: 2017-08-06 | DRG: 204 | Disposition: A | Discharge status: Skilled Nursing Facility | Payer: Medicare Other | Attending: Pulmonary Disease | Admitting: Pulmonary Disease

## 2017-08-05 DIAGNOSIS — Z794 Long term (current) use of insulin: Secondary | ICD-10-CM | POA: Diagnosis not present

## 2017-08-05 DIAGNOSIS — E119 Type 2 diabetes mellitus without complications: Secondary | ICD-10-CM | POA: Diagnosis present

## 2017-08-05 DIAGNOSIS — J9611 Chronic respiratory failure with hypoxia: Secondary | ICD-10-CM | POA: Diagnosis not present

## 2017-08-05 DIAGNOSIS — Z931 Gastrostomy status: Secondary | ICD-10-CM | POA: Diagnosis not present

## 2017-08-05 DIAGNOSIS — R042 Hemoptysis: Principal | ICD-10-CM | POA: Diagnosis present

## 2017-08-05 DIAGNOSIS — J189 Pneumonia, unspecified organism: Secondary | ICD-10-CM | POA: Diagnosis present

## 2017-08-05 DIAGNOSIS — I5032 Chronic diastolic (congestive) heart failure: Secondary | ICD-10-CM | POA: Diagnosis present

## 2017-08-05 DIAGNOSIS — K219 Gastro-esophageal reflux disease without esophagitis: Secondary | ICD-10-CM | POA: Diagnosis present

## 2017-08-05 DIAGNOSIS — Z881 Allergy status to other antibiotic agents status: Secondary | ICD-10-CM | POA: Diagnosis not present

## 2017-08-05 DIAGNOSIS — Z79891 Long term (current) use of opiate analgesic: Secondary | ICD-10-CM | POA: Diagnosis not present

## 2017-08-05 DIAGNOSIS — Z79899 Other long term (current) drug therapy: Secondary | ICD-10-CM

## 2017-08-05 DIAGNOSIS — R1310 Dysphagia, unspecified: Secondary | ICD-10-CM | POA: Diagnosis present

## 2017-08-05 DIAGNOSIS — Z888 Allergy status to other drugs, medicaments and biological substances status: Secondary | ICD-10-CM

## 2017-08-05 DIAGNOSIS — J9621 Acute and chronic respiratory failure with hypoxia: Secondary | ICD-10-CM | POA: Diagnosis present

## 2017-08-05 DIAGNOSIS — Y95 Nosocomial condition: Secondary | ICD-10-CM | POA: Diagnosis present

## 2017-08-05 DIAGNOSIS — R569 Unspecified convulsions: Secondary | ICD-10-CM | POA: Diagnosis present

## 2017-08-05 DIAGNOSIS — R4701 Aphasia: Secondary | ICD-10-CM | POA: Diagnosis present

## 2017-08-05 DIAGNOSIS — G8250 Quadriplegia, unspecified: Secondary | ICD-10-CM | POA: Diagnosis present

## 2017-08-05 DIAGNOSIS — I4891 Unspecified atrial fibrillation: Secondary | ICD-10-CM | POA: Diagnosis present

## 2017-08-05 DIAGNOSIS — R0489 Hemorrhage from other sites in respiratory passages: Secondary | ICD-10-CM

## 2017-08-05 DIAGNOSIS — Z930 Tracheostomy status: Secondary | ICD-10-CM

## 2017-08-05 LAB — PROTIME-INR
INR: 1.06
Prothrombin Time: 13.7 seconds (ref 11.4–15.2)

## 2017-08-05 LAB — CBC WITH DIFFERENTIAL/PLATELET
Basophils Absolute: 0 10*3/uL (ref 0.0–0.1)
Basophils Relative: 0 %
Eosinophils Absolute: 0.3 10*3/uL (ref 0.0–0.7)
Eosinophils Relative: 3 %
HCT: 37.7 % — ABNORMAL LOW (ref 39.0–52.0)
Hemoglobin: 12.1 g/dL — ABNORMAL LOW (ref 13.0–17.0)
Lymphocytes Relative: 21 %
Lymphs Abs: 2.6 10*3/uL (ref 0.7–4.0)
MCH: 28.4 pg (ref 26.0–34.0)
MCHC: 32.1 g/dL (ref 30.0–36.0)
MCV: 88.5 fL (ref 78.0–100.0)
Monocytes Absolute: 0.8 10*3/uL (ref 0.1–1.0)
Monocytes Relative: 7 %
NEUTROS ABS: 8.7 10*3/uL — AB (ref 1.7–7.7)
NEUTROS PCT: 69 %
PLATELETS: 377 10*3/uL (ref 150–400)
RBC: 4.26 MIL/uL (ref 4.22–5.81)
RDW: 15.4 % (ref 11.5–15.5)
WBC: 12.5 10*3/uL — AB (ref 4.0–10.5)

## 2017-08-05 LAB — I-STAT CHEM 8, ED
BUN: 30 mg/dL — AB (ref 6–20)
CALCIUM ION: 1.18 mmol/L (ref 1.15–1.40)
CHLORIDE: 101 mmol/L (ref 101–111)
CREATININE: 0.7 mg/dL (ref 0.61–1.24)
Glucose, Bld: 113 mg/dL — ABNORMAL HIGH (ref 65–99)
HCT: 40 % (ref 39.0–52.0)
Hemoglobin: 13.6 g/dL (ref 13.0–17.0)
Potassium: 4.7 mmol/L (ref 3.5–5.1)
SODIUM: 140 mmol/L (ref 135–145)
TCO2: 29 mmol/L (ref 22–32)

## 2017-08-05 LAB — COMPREHENSIVE METABOLIC PANEL
ALK PHOS: 79 U/L (ref 38–126)
ALT: 18 U/L (ref 17–63)
ANION GAP: 10 (ref 5–15)
AST: 29 U/L (ref 15–41)
Albumin: 3.6 g/dL (ref 3.5–5.0)
BILIRUBIN TOTAL: 0.6 mg/dL (ref 0.3–1.2)
BUN: 26 mg/dL — ABNORMAL HIGH (ref 6–20)
CALCIUM: 9.7 mg/dL (ref 8.9–10.3)
CO2: 27 mmol/L (ref 22–32)
Chloride: 100 mmol/L — ABNORMAL LOW (ref 101–111)
Creatinine, Ser: 0.65 mg/dL (ref 0.61–1.24)
Glucose, Bld: 110 mg/dL — ABNORMAL HIGH (ref 65–99)
Potassium: 4.8 mmol/L (ref 3.5–5.1)
Sodium: 137 mmol/L (ref 135–145)
TOTAL PROTEIN: 7.9 g/dL (ref 6.5–8.1)

## 2017-08-05 LAB — I-STAT ARTERIAL BLOOD GAS, ED
Acid-Base Excess: 4 mmol/L — ABNORMAL HIGH (ref 0.0–2.0)
BICARBONATE: 29.8 mmol/L — AB (ref 20.0–28.0)
O2 Saturation: 97 %
PCO2 ART: 47.6 mmHg (ref 32.0–48.0)
TCO2: 31 mmol/L (ref 22–32)
pH, Arterial: 7.403 (ref 7.350–7.450)
pO2, Arterial: 94 mmHg (ref 83.0–108.0)

## 2017-08-05 LAB — TYPE AND SCREEN
ABO/RH(D): A POS
ANTIBODY SCREEN: NEGATIVE

## 2017-08-05 LAB — APTT: aPTT: 31 seconds (ref 24–36)

## 2017-08-05 MED ORDER — DEXTROSE 5 % IV SOLN
1.0000 g | Freq: Three times a day (TID) | INTRAVENOUS | Status: DC
  Filled 2017-08-05 (×2): qty 1

## 2017-08-05 MED ORDER — IPRATROPIUM-ALBUTEROL 0.5-2.5 (3) MG/3ML IN SOLN
3.0000 mL | Freq: Four times a day (QID) | RESPIRATORY_TRACT | Status: DC
  Administered 2017-08-05 – 2017-08-06 (×4): 3 mL via RESPIRATORY_TRACT
  Filled 2017-08-05 (×4): qty 3

## 2017-08-05 MED ORDER — LACOSAMIDE 50 MG PO TABS
100.0000 mg | ORAL_TABLET | Freq: Two times a day (BID) | ORAL | Status: DC
  Administered 2017-08-05 – 2017-08-06 (×2): 100 mg
  Filled 2017-08-05 (×2): qty 2

## 2017-08-05 MED ORDER — SODIUM CHLORIDE 0.9 % IV SOLN
INTRAVENOUS | Status: DC
  Administered 2017-08-05 – 2017-08-06 (×2): via INTRAVENOUS

## 2017-08-05 MED ORDER — PIPERACILLIN-TAZOBACTAM 3.375 G IVPB
3.3750 g | Freq: Three times a day (TID) | INTRAVENOUS | Status: DC
  Administered 2017-08-06 (×2): 3.375 g via INTRAVENOUS
  Filled 2017-08-05 (×2): qty 50

## 2017-08-05 MED ORDER — DOCUSATE SODIUM 50 MG/5ML PO LIQD
100.0000 mg | Freq: Two times a day (BID) | ORAL | Status: DC
  Administered 2017-08-05: 100 mg via ORAL
  Filled 2017-08-05 (×2): qty 10

## 2017-08-05 MED ORDER — LINEZOLID 600 MG/300ML IV SOLN
600.0000 mg | Freq: Two times a day (BID) | INTRAVENOUS | Status: DC
  Administered 2017-08-05 – 2017-08-06 (×2): 600 mg via INTRAVENOUS
  Filled 2017-08-05 (×4): qty 300

## 2017-08-05 MED ORDER — SODIUM CHLORIDE 0.9 % IV BOLUS (SEPSIS)
500.0000 mL | Freq: Once | INTRAVENOUS | Status: AC
  Administered 2017-08-05: 500 mL via INTRAVENOUS

## 2017-08-05 MED ORDER — SODIUM CHLORIDE 0.9 % IV SOLN
250.0000 mL | INTRAVENOUS | Status: DC | PRN
Start: 2017-08-05 — End: 2017-08-06

## 2017-08-05 MED ORDER — DIAZEPAM 2 MG PO TABS
2.0000 mg | ORAL_TABLET | Freq: Two times a day (BID) | ORAL | Status: DC
  Administered 2017-08-05 – 2017-08-06 (×2): 2 mg
  Filled 2017-08-05 (×2): qty 1

## 2017-08-05 MED ORDER — IOPAMIDOL (ISOVUE-370) INJECTION 76%
INTRAVENOUS | Status: AC
  Administered 2017-08-05: 100 mL via INTRAVENOUS
  Filled 2017-08-05: qty 100

## 2017-08-05 MED ORDER — FAMOTIDINE IN NACL 20-0.9 MG/50ML-% IV SOLN
20.0000 mg | Freq: Two times a day (BID) | INTRAVENOUS | Status: DC
  Administered 2017-08-05 – 2017-08-06 (×2): 20 mg via INTRAVENOUS
  Filled 2017-08-05 (×2): qty 50

## 2017-08-05 MED ORDER — PIPERACILLIN-TAZOBACTAM 3.375 G IVPB 30 MIN
3.3750 g | Freq: Once | INTRAVENOUS | Status: AC
  Administered 2017-08-05: 3.375 g via INTRAVENOUS
  Filled 2017-08-05: qty 50

## 2017-08-05 MED ORDER — LEVETIRACETAM 100 MG/ML PO SOLN
1000.0000 mg | Freq: Two times a day (BID) | ORAL | Status: DC
  Administered 2017-08-05 – 2017-08-06 (×2): 1000 mg
  Filled 2017-08-05 (×2): qty 10

## 2017-08-05 NOTE — Progress Notes (Signed)
Pharmacy Antibiotic Note  Dean Graham is a 56 y.o. male with chronic trach admitted from [facility] on 08/05/2017 with pneumonia.  Pharmacy has been consulted for Zosyn dosing. Noted allergy to ceftriaxone - but has had Zosyn and Unasyn in the past. MD dosing Linezolid due to Vancomycin allergy.   CT chest shows RLL infiltrate. Also has bleeding from R- main stem bronchus.  WBC elevated. Afebrile. SCr 0.70 (may be falsely low insetting of quadriplegia). Last available weight 58.7 kg.   Plan: Zosyn 3.375g IV q8h (4 hour infusion).  Continue Linezolid 600mg  IV BID per MD.  Monitor renal function, culture results and clinical status.     Temp (24hrs), Avg:98.1 F (36.7 C), Min:98.1 F (36.7 C), Max:98.1 F (36.7 C)  Recent Labs  Lab 08/05/17 1445 08/05/17 1457  WBC 12.5*  --   CREATININE 0.65 0.70    CrCl cannot be calculated (Unknown ideal weight.).    Allergies  Allergen Reactions  . Ativan [Lorazepam] Other (See Comments)    unknown  . Azithromycin Other (See Comments)    Blisters all over body  . Ceftriaxone Other (See Comments)    Blisters all over body  . Vancomycin Other (See Comments)    Blisters all over body    Antimicrobials this admission: Zosyn 11/26 >> Linezolid 11/26 >>  Dose adjustments this admission:   Microbiology results: 11/26 BCx:  11/26 MRSA PCR:   Thank you for allowing pharmacy to be a part of this patient's care.  Link SnufferJessica Gal Smolinski, PharmD, BCPS, BCCCP Clinical Pharmacist Clinical phone 08/05/2017 until 11PM [phone]- #[phone] After hours, please call #[phone] 08/05/2017 8:52 PM

## 2017-08-05 NOTE — ED Notes (Signed)
Pt's bed changed completely, pt in new brief. Pt had bowel movement and urinated in brief. Pt tolerated well.

## 2017-08-05 NOTE — ED Notes (Signed)
Spoke with Okey Dupreose, RT at [facility] regarding trach care-- states trach is changed q30d-- was changed 07/09/17 --  Pt has had trach for 50 years.

## 2017-08-05 NOTE — ED Notes (Signed)
After ABG draw, pt suctions and asked if he felt better after and pt nodded his head yes, this RN asked pt if he was in pain and pt nodded his head no.

## 2017-08-05 NOTE — ED Provider Notes (Signed)
[facility] EMERGENCY DEPARTMENT Provider Note   CSN: [id number] Arrival date & time: 08/05/17  1429     History   Chief Complaint Chief Complaint  Patient presents with  . bleeding trach    HPI Elex Montague is a 56 y.o. male.  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  Patient is nonverbal, chronically contractured.  According to [facility] nursing home, patient started to bleed from his strength this morning  At 7 AM.  Patient was put out 700 cc of blood since then.   According to [facility], patient's last trach change was done 10/30.  He wears an XL Shiley 6-0   Past Medical History:  Diagnosis Date  . Atrial fibrillation (HCC)   . CHF (congestive heart failure) (HCC)   . Chronic respiratory failure (HCC)   . Diabetes mellitus without complication (HCC)   . Diastolic heart failure (HCC)   . Dysphagia   . GERD (gastroesophageal reflux disease)   . Quadriplegia (HCC)   . Seizures [facility])     Patient Active Problem List   Diagnosis Date Noted  . Chronic diastolic heart failure (HCC) 04/09/2017  . GERD (gastroesophageal reflux disease) 04/09/2017  . Atrial fibrillation, currently in sinus rhythm 04/09/2017  . Tracheostomy in place [facility]) 04/09/2017  . Chronic respiratory failure with hypoxia (HCC) 04/09/2017  . 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 Diabetes mellitus type 2, diet-controlled (HCC) 04/09/2017  . Chronic hypotension 04/09/2017  . H/O quadriplegia 04/09/2017  . Acute hypoxemic respiratory failure (HCC)   . Ventilator dependent (HCC)   . 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 Fever   . Leukocytosis   . Aspiration pneumonia (HCC) 01/04/2017  . Anasarca   . Hemoptysis   . Tracheostomy care (HCC)   . Hypoalbuminemia   . Cough with frothy sputum   . VAP (ventilator-associated pneumonia) (HCC)   .  Carbapenem-resistant Acinetobacter baumannii infection   . History of infection by MDR Stenotrophomonas maltophilia   . History of MDR Pseudomonas aeruginosa infection   . Chronic post traumatic encephalopathy   . Acute encephalopathy   . Hypokalemia   . Hypomagnesemia   . Urinary tract infection, site not specified   . Acute on chronic respiratory failure with hypoxia (HCC)   . Dysphagia   . Decubitus ulcer of sacral area   . Encounter for wound care   . Protein-calorie malnutrition, severe (HCC)   . Palliative care encounter   . Goals of care, counseling/discussion   . Pressure injury of skin 05/31/2016  . Quadriplegia (HCC) 05/31/2016  . Atrial fibrillation (HCC) 05/31/2016  . Diabetes mellitus without complication (HCC) 05/31/2016  . Chronic respiratory failure (HCC) 05/31/2016  . 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 Septic shock (HCC) 05/29/2016  . HCAP (healthcare-associated pneumonia)     Past Surgical History:  Procedure Laterality Date  . IR GASTROSTOMY TUBE MOD SED  01/11/2017       Home Medications    Prior to Admission medications   Medication Sig Start Date End Date Taking? Authorizing Provider  acetaminophen (TYLENOL) 325 MG tablet Place 2 tablets (650 mg total) into feeding tube every 6 (six) hours as needed for mild pain (or Fever >/= 101). 04/15/17   Lonia Blood, MD  Amino Acids-Protein Hydrolys (FEEDING SUPPLEMENT, PRO-STAT SUGAR FREE 64,) LIQD Place 60 mLs into feeding tube daily. 04/16/17  Lonia BloodMcClung, Jeffrey T, MD  chlorhexidine (PERIDEX) 0.12 % solution Use as directed 15 mLs in the mouth or throat 2 (two) times daily. 04/15/17   Lonia BloodMcClung, Jeffrey T, MD  diazepam (VALIUM) 2 MG tablet Place 1 tablet (2 mg total) into feeding tube 2 (two) times daily. 04/15/17   Lonia BloodMcClung, Jeffrey T, MD  enoxaparin (LOVENOX) 40 MG/0.4ML injection Inject 0.4 mLs (40 mg total) into the skin at bedtime. 04/15/17   Lonia BloodMcClung, Jeffrey T, MD  famotidine (PEPCID) 20 MG tablet Place 1  tablet (20 mg total) into feeding tube 2 (two) times daily. 04/15/17   Lonia BloodMcClung, Jeffrey T, MD  insulin aspart (NOVOLOG) 100 UNIT/ML injection Inject 0-9 Units into the skin every 4 (four) hours. Patient not taking: Reported on 06/13/2017 04/15/17   Lonia BloodMcClung, Jeffrey T, MD  lacosamide 100 MG TABS Place 1 tablet (100 mg total) into feeding tube 2 (two) times daily. 04/15/17   Lonia BloodMcClung, Jeffrey T, MD  levETIRAcetam (KEPPRA) 100 MG/ML solution Place 10 mLs (1,000 mg total) into feeding tube 2 (two) times daily. 04/15/17   Lonia BloodMcClung, Jeffrey T, MD  midodrine (PROAMATINE) 10 MG tablet Place 1 tablet (10 mg total) into feeding tube 3 (three) times daily with meals. 04/15/17   Lonia BloodMcClung, Jeffrey T, MD  Multiple Vitamin (MULTIVITAMIN WITH MINERALS) TABS tablet Place 1 tablet into feeding tube daily. 04/16/17   Lonia BloodMcClung, Jeffrey T, MD  Nutritional Supplements (FEEDING SUPPLEMENT, OSMOLITE 1.5 CAL,) LIQD Place 1,000 mLs into feeding tube continuous. 04/15/17   Lonia BloodMcClung, Jeffrey T, MD  nystatin (NYSTATIN) powder Apply 1 g topically 2 (two) times daily.    [provider]  ondansetron (ZOFRAN) 4 MG tablet Place 1 tablet (4 mg total) into feeding tube every 6 (six) hours as needed for nausea. 04/15/17   Lonia BloodMcClung, Jeffrey T, MD  saccharomyces boulardii (FLORASTOR) 250 MG capsule Place 1 capsule (250 mg total) into feeding tube 2 (two) times daily. 04/15/17   Lonia BloodMcClung, Jeffrey T, MD  traMADol (ULTRAM) 50 MG tablet Place 1 tablet (50 mg total) into feeding tube 2 (two) times daily. 04/15/17   Lonia BloodMcClung, Jeffrey T, MD  Water For Irrigation, Sterile (FREE WATER) SOLN Place 350 mLs into feeding tube every 6 (six) hours. 04/15/17   Lonia BloodMcClung, Jeffrey T, MD    Family History No family history on file.  Social History Social History   Tobacco Use  . Smoking status: Never Smoker  . Smokeless tobacco: Never Used  Substance Use Topics  . Alcohol use: No  . Drug use: No     Allergies   Ativan [lorazepam]; Azithromycin; Ceftriaxone; and  Vancomycin   Review of Systems Review of Systems  Unable to perform ROS: Patient nonverbal     Physical Exam Updated Vital Signs There were no vitals taken for this visit.  Physical Exam  Constitutional: He appears well-developed and well-nourished.  Chronically ill-appearing, trach and PEG with permanent contractures.  HENT:  Head: Normocephalic and atraumatic.  Trach site appears clean.  Dried blood in the trach tubing.  Eyes: Conjunctivae are normal. Right eye exhibits no discharge. Left eye exhibits no discharge.  Neck: Neck supple.  Cardiovascular:  No murmur heard. Tachycardia.  Pulmonary/Chest: Effort normal and breath sounds normal. No respiratory distress.  Abdominal: Soft. There is no tenderness.  PEG tube in place.  Musculoskeletal: He exhibits no edema.  Neurological:  Eyes closed responds to painful stimuli.  Skin: Skin is warm and dry.  Nursing note and vitals reviewed.    ED Treatments /  Results  Labs (all labs ordered are listed, but only abnormal results are displayed) Labs Reviewed  COMPREHENSIVE METABOLIC PANEL  CBC WITH DIFFERENTIAL/PLATELET  PROTIME-INR  APTT  BLOOD GAS, ARTERIAL  I-STAT CHEM 8, ED  TYPE AND SCREEN    EKG  EKG Interpretation None       Radiology No results found.  Procedures Procedures (including critical care time) CRITICAL CARE Performed by: Arlana Hove Total critical care time: 45 minutes Critical care time was exclusive of separately billable procedures and treating other patients. Critical care was necessary to treat or prevent imminent or life-threatening deterioration. Critical care was time spent personally by me on the following activities: development of treatment plan with patient and/or surrogate as well as nursing, discussions with consultants, evaluation of patient's response to treatment, examination of patient, obtaining history from patient or surrogate, ordering and performing treatments  and interventions, ordering and review of laboratory studies, ordering and review of radiographic studies, pulse oximetry and re-evaluation of patient's condition.  Medications Ordered in ED Medications - No data to display   Initial Impression / Assessment and Plan / ED Course  I have reviewed the triage vital signs and the nursing notes.  Pertinent labs & imaging results that were available during my care of the patient were reviewed by me and considered in my medical decision making (see chart for details).     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  Patient is nonverbal, chronically contractured.  According to [facility] nursing home, patient started to bleed from his strength this morning  At 7 AM.  Patient was put out 700 cc of blood since then.  According to [facility], patient's last trach change was done 10/30.  He wears an XL Shiley 6-0  CXR reassuring. No blood from trach here.  Sating comfortably on room air. Appears comfortable.    3:44 PM Pollyann Kennedy (ENT) will be coming to see patient. Signed onto oncoming provider to follow up his assessment.   Final Clinical Impressions(s) / ED Diagnoses   Final diagnoses:  None    ED Discharge Orders    None       Crestina Strike, Cindee Salt, MD 08/05/17 1547
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
## 2017-08-05 NOTE — ED Provider Notes (Signed)
Patient seen by Dr. Pollyann Kennedy of ENT. Appreciate Dr. Lucky Rathke consult. No signs of tracheal erosion. Blood source seems to be from right mainstem and distally. Minimal blood to no blood in left mainstem. Upper airway show no nosebleed no oral pharyngeal blood.  CT scan shows debris in the trachea and right mainstem. Opacities in the right lower lobe, pulmonary hemorrhage, versus pneumonia.  For cyanosis includes infection, pulmonary hemorrhage, pulmonary or mainstem hemorrhage status post suctioning. He has put out less than 100 here. His pulse signs are stable. Will discuss with pulmonary.   Rolland Porter, MD 08/05/17 [phone]

## 2017-08-05 NOTE — ED Notes (Signed)
Pt taken to CT on baby monitor.

## 2017-08-05 NOTE — Consult Note (Signed)
Reason for Consult: Hemoptysis Referring Physician: Tanna Furry, MD  Dean Graham is an 56 y.o. male.  HPI: Chronic quadriplegic, chronic trach dependent, here today for hemoptysis.  He had a large amount of bloody material from his tracheostomy earlier today.  There has not been much since arrival in the emergency department.  He had a similar problem in August.  He had CT of the chest at that time which revealed some nonspecific abnormalities.  He has an XLT tracheostomy in place.  Ventilation has not been an issue.  Chest x-ray was clear.  Past Medical History:  Diagnosis Date  . Atrial fibrillation ([place])   . CHF (congestive heart failure) ([place])   . Chronic respiratory failure ([place])   . Diabetes mellitus without complication ([place])   . Diastolic heart failure ([place])   . Dysphagia   . GERD (gastroesophageal reflux disease)   . Quadriplegia ([place])   . Seizures ([place])     Past Surgical History:  Procedure Laterality Date  . IR GASTROSTOMY TUBE MOD SED  01/11/2017    No family history on file.  Social History:  reports that  has never smoked. he has never used smokeless tobacco. He reports that he does not drink alcohol or use drugs.  Allergies:  Allergies  Allergen Reactions  . Ativan [Lorazepam] Other (See Comments)    unknown  . Azithromycin Other (See Comments)    Blisters all over body  . Ceftriaxone Other (See Comments)    Blisters all over body  . Vancomycin Other (See Comments)    Blisters all over body    Medications: Reviewed  Results for orders placed or performed during the hospital encounter of 08/05/17 (from the past 48 hour(s))  Comprehensive metabolic panel     Status: Abnormal   Collection Time: 08/05/17  2:45 PM  Result Value Ref Range   Sodium 137 135 - 145 mmol/L   Potassium 4.8 3.5 - 5.1 mmol/L   Chloride 100 (L) 101 - 111 mmol/L   CO2 27 22 - 32 mmol/L   Glucose, Bld 110 (H) 65 - 99 mg/dL   BUN 26 (H) 6 - 20 mg/dL   Creatinine, Ser 0.65 0.61 -  1.24 mg/dL   Calcium 9.7 8.9 - 10.3 mg/dL   Total Protein 7.9 6.5 - 8.1 g/dL   Albumin 3.6 3.5 - 5.0 g/dL   AST 29 15 - 41 U/L   ALT 18 17 - 63 U/L   Alkaline Phosphatase 79 38 - 126 U/L   Total Bilirubin 0.6 0.3 - 1.2 mg/dL   GFR calc non Af Amer >60 >60 mL/min   GFR calc Af Amer >60 >60 mL/min    Comment: (NOTE) The eGFR has been calculated using the CKD EPI equation. This calculation has not been validated in all clinical situations. eGFR's persistently <60 mL/min signify possible Chronic Kidney Disease.    Anion gap 10 5 - 15  CBC with Differential     Status: Abnormal   Collection Time: 08/05/17  2:45 PM  Result Value Ref Range   WBC 12.5 (H) 4.0 - 10.5 K/uL   RBC 4.26 4.22 - 5.81 MIL/uL   Hemoglobin 12.1 (L) 13.0 - 17.0 g/dL   HCT 37.7 (L) 39.0 - 52.0 %   MCV 88.5 78.0 - 100.0 fL   MCH 28.4 26.0 - 34.0 pg   MCHC 32.1 30.0 - 36.0 g/dL   RDW 15.4 11.5 - 15.5 %   Platelets 377 150 - 400 K/uL  Neutrophils Relative % 69 %   Neutro Abs 8.7 (H) 1.7 - 7.7 K/uL   Lymphocytes Relative 21 %   Lymphs Abs 2.6 0.7 - 4.0 K/uL   Monocytes Relative 7 %   Monocytes Absolute 0.8 0.1 - 1.0 K/uL   Eosinophils Relative 3 %   Eosinophils Absolute 0.3 0.0 - 0.7 K/uL   Basophils Relative 0 %   Basophils Absolute 0.0 0.0 - 0.1 K/uL  Protime-INR     Status: None   Collection Time: 08/05/17  2:45 PM  Result Value Ref Range   Prothrombin Time 13.7 11.4 - 15.2 seconds   INR 1.06   APTT     Status: None   Collection Time: 08/05/17  2:45 PM  Result Value Ref Range   aPTT 31 24 - 36 seconds  Type and screen [place]     Status: None   Collection Time: 08/05/17  2:45 PM  Result Value Ref Range   ABO/RH(D) A POS    Antibody Screen NEG    Sample Expiration 08/08/2017   I-stat Chem 8, ED     Status: Abnormal   Collection Time: 08/05/17  2:57 PM  Result Value Ref Range   Sodium 140 135 - 145 mmol/L   Potassium 4.7 3.5 - 5.1 mmol/L   Chloride 101 101 - 111 mmol/L   BUN  30 (H) 6 - 20 mg/dL   Creatinine, Ser 0.70 0.61 - 1.24 mg/dL   Glucose, Bld 113 (H) 65 - 99 mg/dL   Calcium, Ion 1.18 1.15 - 1.40 mmol/L   TCO2 29 22 - 32 mmol/L   Hemoglobin 13.6 13.0 - 17.0 g/dL   HCT 40.0 39.0 - 52.0 %  I-Stat arterial blood gas, ED     Status: Abnormal   Collection Time: 08/05/17  3:25 PM  Result Value Ref Range   pH, Arterial 7.403 7.350 - 7.450   pCO2 arterial 47.6 32.0 - 48.0 mmHg   pO2, Arterial 94.0 83.0 - 108.0 mmHg   Bicarbonate 29.8 (H) 20.0 - 28.0 mmol/L   TCO2 31 22 - 32 mmol/L   O2 Saturation 97.0 %   Acid-Base Excess 4.0 (H) 0.0 - 2.0 mmol/L   Patient temperature 98.1 F    Collection site RADIAL, ALLEN'S TEST ACCEPTABLE    Drawn by RT    Sample type ARTERIAL     Dg Chest Portable 1 View  Result Date: 08/05/2017 CLINICAL DATA:  Bleeding tracheostomy. EXAM: PORTABLE CHEST 1 VIEW COMPARISON:  Radiograph of April 09, 2017. FINDINGS: Stable cardiomediastinal silhouette. New tracheostomy tube is in grossly good position. No pneumothorax is noted. Mild bibasilar subsegmental atelectasis is noted. No pleural effusion is noted. Bony thorax is unremarkable. IMPRESSION: Tracheostomy tube in grossly good position. Mild bibasilar subsegmental atelectasis. Electronically Signed   By: Marijo Conception, M.D.   On: 08/05/2017 15:02    BBC:[id number] except as listed in admit H&P  Blood pressure (!) 131/94, pulse (!) 106, temperature 98.1 F (36.7 C), temperature source Temporal, resp. rate 13, SpO2 100 %.  PHYSICAL EXAM: Overall appearance: Resting quietly, no active bleeding from tracheostomy, in no distress Head:  Normocephalic, atraumatic. Ears: External ears look normal. Nose: External nose is healthy in appearance. Internal nasal exam free of any lesions or obstruction.  There is no fresh or dried blood in the nasal cavities. Oral Cavity/Pharynx:  There are no mucosal lesions or masses identified.  There is no fresh or dried blood in the oral cavity or  pharynx. Larynx/Hypopharynx: Deferred Neuro: Quadriplegic. Neck: No palpable neck masses.  Tracheostomy is well seated without any blood around the tracheal stoma.  I was able to pass the fiberoptic scope through the tracheostomy tract along the inferior aspect of the tube down about 3 cm and there was no signs of blood or granulation tissue.  Studies Reviewed: none  Procedures: Flexible fiberoptic tracheoscopy was performed through the tracheostomy tube.  The left mainstem bronchus is clear.  The right side contains blood and clot.  I was able to suction out some of this.  Bloody mucosal secretions were obtained.  I was able to withdrawal the tracheostomy tube about 3 cm and the trachea around the tracheostomy tube appeared to be free of any ulcerations or lesions.   Assessment/Plan: Recent hemoptysis, same problem in August.  I suspect this is a lower pulmonary process based on the findings today.  Recommend additional workup of the lungs especially on the right.  Follow-up if needed.  Izora Gala 08/05/2017, 4:35 PM

## 2017-08-05 NOTE — ED Notes (Signed)
Dr Aurelio Brash, ENT in room with pt with fibroptic scope examining trach.

## 2017-08-06 ENCOUNTER — Telehealth (HOSPITAL_BASED_OUTPATIENT_CLINIC_OR_DEPARTMENT_OTHER): Payer: Self-pay | Admitting: *Deleted

## 2017-08-06 ENCOUNTER — Inpatient Hospital Stay (HOSPITAL_COMMUNITY): Payer: Medicare Other

## 2017-08-06 DIAGNOSIS — Z930 Tracheostomy status: Secondary | ICD-10-CM

## 2017-08-06 DIAGNOSIS — J9611 Chronic respiratory failure with hypoxia: Secondary | ICD-10-CM

## 2017-08-06 LAB — BASIC METABOLIC PANEL
Anion gap: 8 (ref 5–15)
BUN: 17 mg/dL (ref 6–20)
CO2: 27 mmol/L (ref 22–32)
Calcium: 9.1 mg/dL (ref 8.9–10.3)
Chloride: 104 mmol/L (ref 101–111)
Creatinine, Ser: 0.7 mg/dL (ref 0.61–1.24)
GFR calc Af Amer: >60 mL/min (ref >60)
GFR calc non Af Amer: >60 mL/min (ref >60)
GLUCOSE: 93 mg/dL (ref 65–99)
Potassium: 4.1 mmol/L (ref 3.5–5.1)
SODIUM: 139 mmol/L (ref 135–145)

## 2017-08-06 LAB — BLOOD CULTURE ID PANEL (REFLEXED)
ACINETOBACTER BAUMANNII: NOT DETECTED
CANDIDA ALBICANS: NOT DETECTED
CANDIDA PARAPSILOSIS: NOT DETECTED
CANDIDA TROPICALIS: NOT DETECTED
Candida glabrata: NOT DETECTED
Candida krusei: NOT DETECTED
ENTEROBACTERIACEAE SPECIES: NOT DETECTED
Enterobacter cloacae complex: NOT DETECTED
Enterococcus species: NOT DETECTED
Escherichia coli: NOT DETECTED
HAEMOPHILUS INFLUENZAE: NOT DETECTED
KLEBSIELLA OXYTOCA: NOT DETECTED
KLEBSIELLA PNEUMONIAE: NOT DETECTED
Listeria monocytogenes: NOT DETECTED
METHICILLIN RESISTANCE: DETECTED — AB
NEISSERIA MENINGITIDIS: NOT DETECTED
Proteus species: NOT DETECTED
Pseudomonas aeruginosa: NOT DETECTED
SERRATIA MARCESCENS: NOT DETECTED
STAPHYLOCOCCUS AUREUS BCID: NOT DETECTED
STREPTOCOCCUS PYOGENES: NOT DETECTED
STREPTOCOCCUS SPECIES: NOT DETECTED
Staphylococcus species: DETECTED — AB
Streptococcus agalactiae: NOT DETECTED
Streptococcus pneumoniae: NOT DETECTED

## 2017-08-06 LAB — CBC
HCT: 33.8 % — ABNORMAL LOW (ref 39.0–52.0)
Hemoglobin: 10.8 g/dL — ABNORMAL LOW (ref 13.0–17.0)
MCH: 27.9 pg (ref 26.0–34.0)
MCHC: 32 g/dL (ref 30.0–36.0)
MCV: 87.3 fL (ref 78.0–100.0)
PLATELETS: 304 10*3/uL (ref 150–400)
RBC: 3.87 MIL/uL — ABNORMAL LOW (ref 4.22–5.81)
RDW: 15.1 % (ref 11.5–15.5)
WBC: 7.6 10*3/uL (ref 4.0–10.5)

## 2017-08-06 MED ORDER — DOCUSATE SODIUM 50 MG/5ML PO LIQD
100.0000 mg | Freq: Two times a day (BID) | ORAL | Status: DC
  Administered 2017-08-06: 100 mg
  Filled 2017-08-06: qty 10

## 2017-08-06 MED ORDER — PIPERACILLIN-TAZOBACTAM 3.375 G IVPB: 3.3750 g | Freq: Three times a day (TID) | INTRAVENOUS | Status: DC

## 2017-08-06 MED ORDER — LINEZOLID 600 MG/300ML IV SOLN: 600.0000 mg | Freq: Two times a day (BID) | INTRAVENOUS | Status: DC

## 2017-08-06 NOTE — ED Notes (Signed)
Patient is resting quietly  Patient shakes his head appropriately to questions ask.

## 2017-08-06 NOTE — ED Notes (Signed)
Family at bedside. 

## 2017-08-06 NOTE — ED Notes (Signed)
Tracheostomy dressing changed /suctioned with minimal bleeding .

## 2017-08-06 NOTE — ED Notes (Signed)
Patient was cleaned  And linens changed. Bath given. Warm blankets applied.
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
## 2017-08-06 NOTE — Progress Notes (Signed)
CSW received call from [employer] at [facility] seeking information on pt returning to the facility today. CSW spoke with pt's RN and confirmed that pt would be returning to [facility] today after 4pm. RN to call [facility] to transport pt back to the facility. There are no further CSW needs at this time. CSW signing off.      Dean Graham, MSW, LCSW-A Emergency Department Clinical Social Worker [phone]

## 2017-08-06 NOTE — ED Notes (Signed)
Patient cleaned and linens changed, patient given bath.

## 2017-08-06 NOTE — ED Notes (Signed)
[facility] called to transport patient back to [facility]

## 2017-08-08 LAB — CULTURE, BLOOD (ROUTINE X 2): SPECIAL REQUESTS: ADEQUATE

## 2017-08-08 LAB — CULTURE, RESPIRATORY: CULTURE: NORMAL

## 2017-08-08 LAB — CULTURE, RESPIRATORY W GRAM STAIN

## 2017-08-10 LAB — CULTURE, BLOOD (ROUTINE X 2)
Culture: NO GROWTH
Special Requests: ADEQUATE

## 2018-02-12 IMAGING — DX DG CHEST 1V PORT
1 series · 1 of 1 positions shown · non-contrast
Comparison: None.

CLINICAL DATA: Fever.

EXAM:
PORTABLE CHEST 1 VIEW

[chest ap]
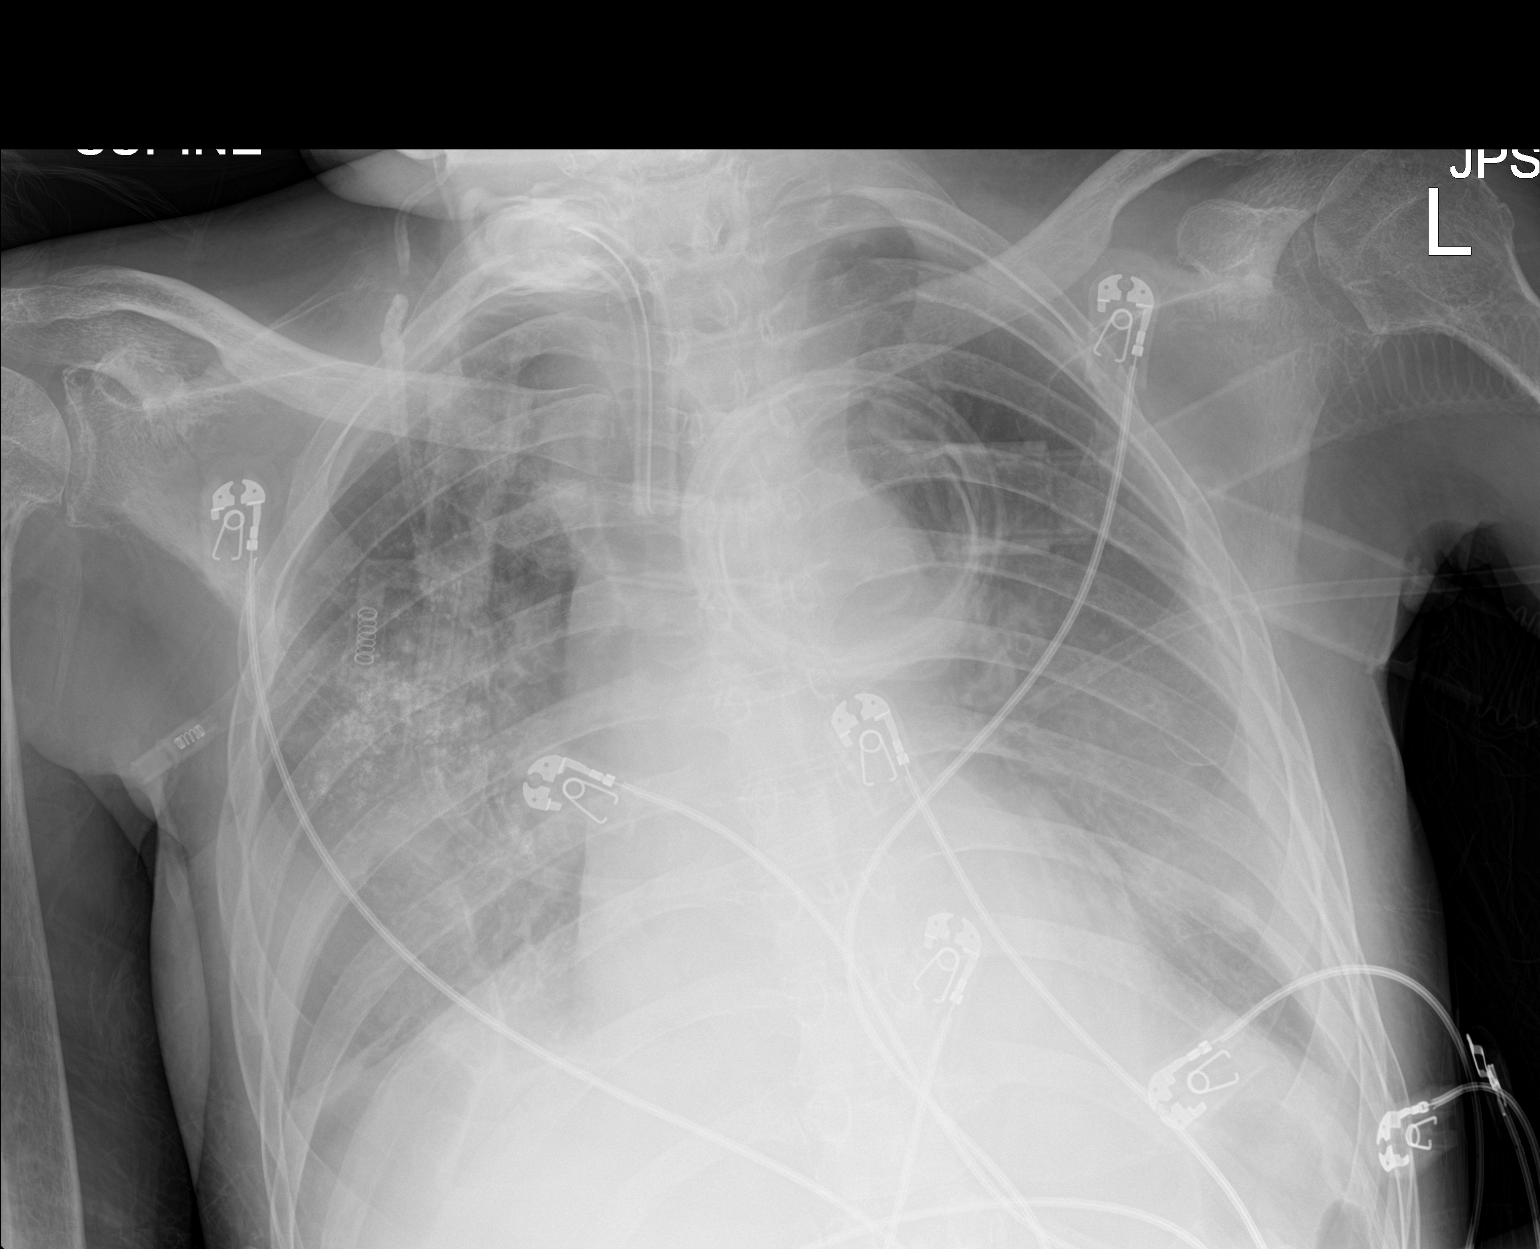

[1 of 1 positions shown; findings below may reference images not displayed]

FINDINGS: Extensive hazy opacification of the bilateral chest with streaky
interstitial densities. Stippled calcific type density over the
right chest which could be parenchymal or artifactual. Sacrificed or
at least discontinuous shunt catheter over the right neck.
Tracheostomy tube is well seated. Normal heart size. Negative
mediastinal contours for rotation. Osteopenia in this patient with
quadriplegia. 1 cm nodular density over the left base.
IMPRESSION: 1. Bilateral pleural effusion with pneumonia or atelectasis.
2. Artifact or nonspecific calcification over the right chest.
3. 1 cm nodule over the left base, likely nipple shadow. Attention
on follow-up.

## 2018-02-12 IMAGING — CR DG CHEST 1V PORT
1 series · 1 of 1 positions shown · non-contrast
Comparison: 05/28/2016

CLINICAL DATA: Unsuccessful central line attempt

EXAM:
PORTABLE CHEST 1 VIEW

[AP]
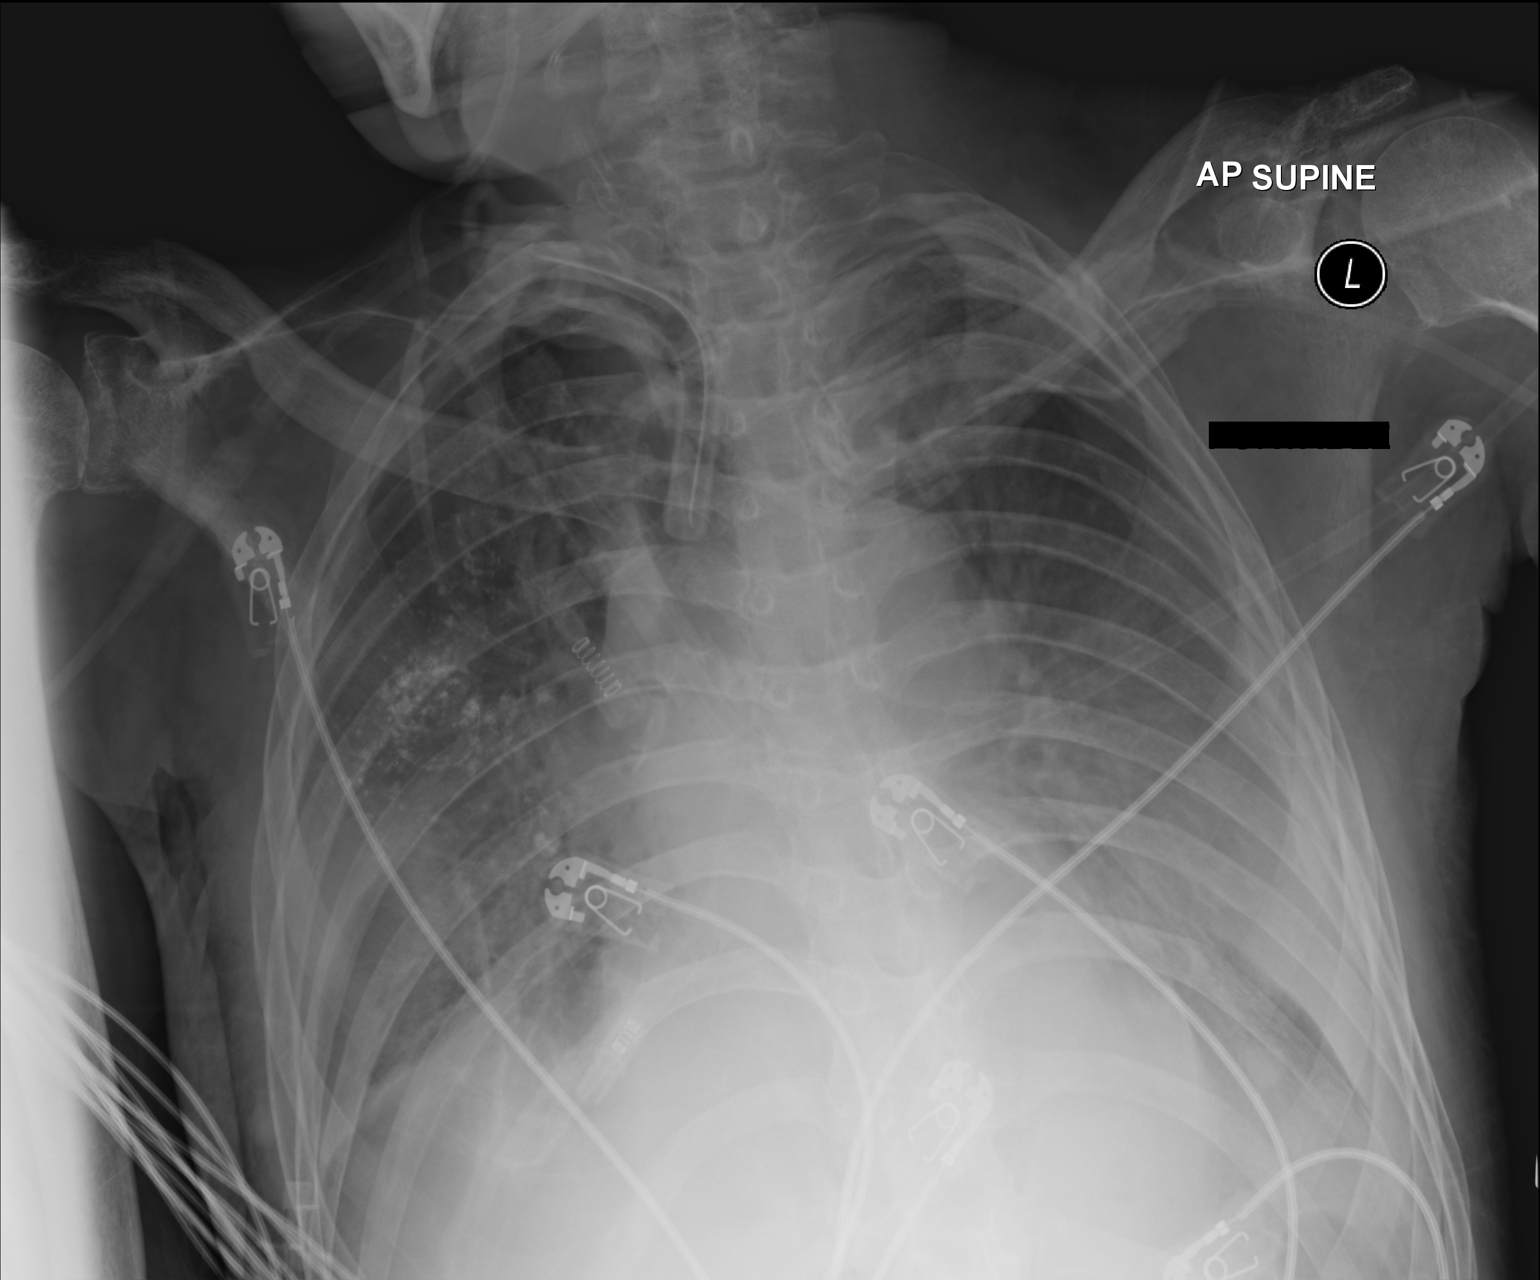

[1 of 1 positions shown; findings below may reference images not displayed]

FINDINGS: Tracheostomy in the mid trachea 3.5 cm above the carina. Similar
hazy opacification of both lungs compatible with posterior layering
effusions and bibasilar consolidation/atelectasis. Limited portable
supine exam. No large pneumothorax appreciated. Stable punctate
calcific densities throughout the right chest, indeterminate. Mild
cardiac enlargement. No significant osseous finding. Monitor leads
overlie the chest.
IMPRESSION: Persistent posterior layering pleural effusions bilaterally with
bibasilar consolidation/atelectasis.

Stable tracheostomy

No large pneumothorax by portable radiography.  Overall stable exam.

## 2018-02-19 IMAGING — CR DG ABD PORTABLE 1V
1 series · 1 of 1 positions shown · non-contrast
Comparison: None.

CLINICAL DATA: Acute onset of vomiting.  Initial encounter.

EXAM:
PORTABLE ABDOMEN - 1 VIEW

[AP]
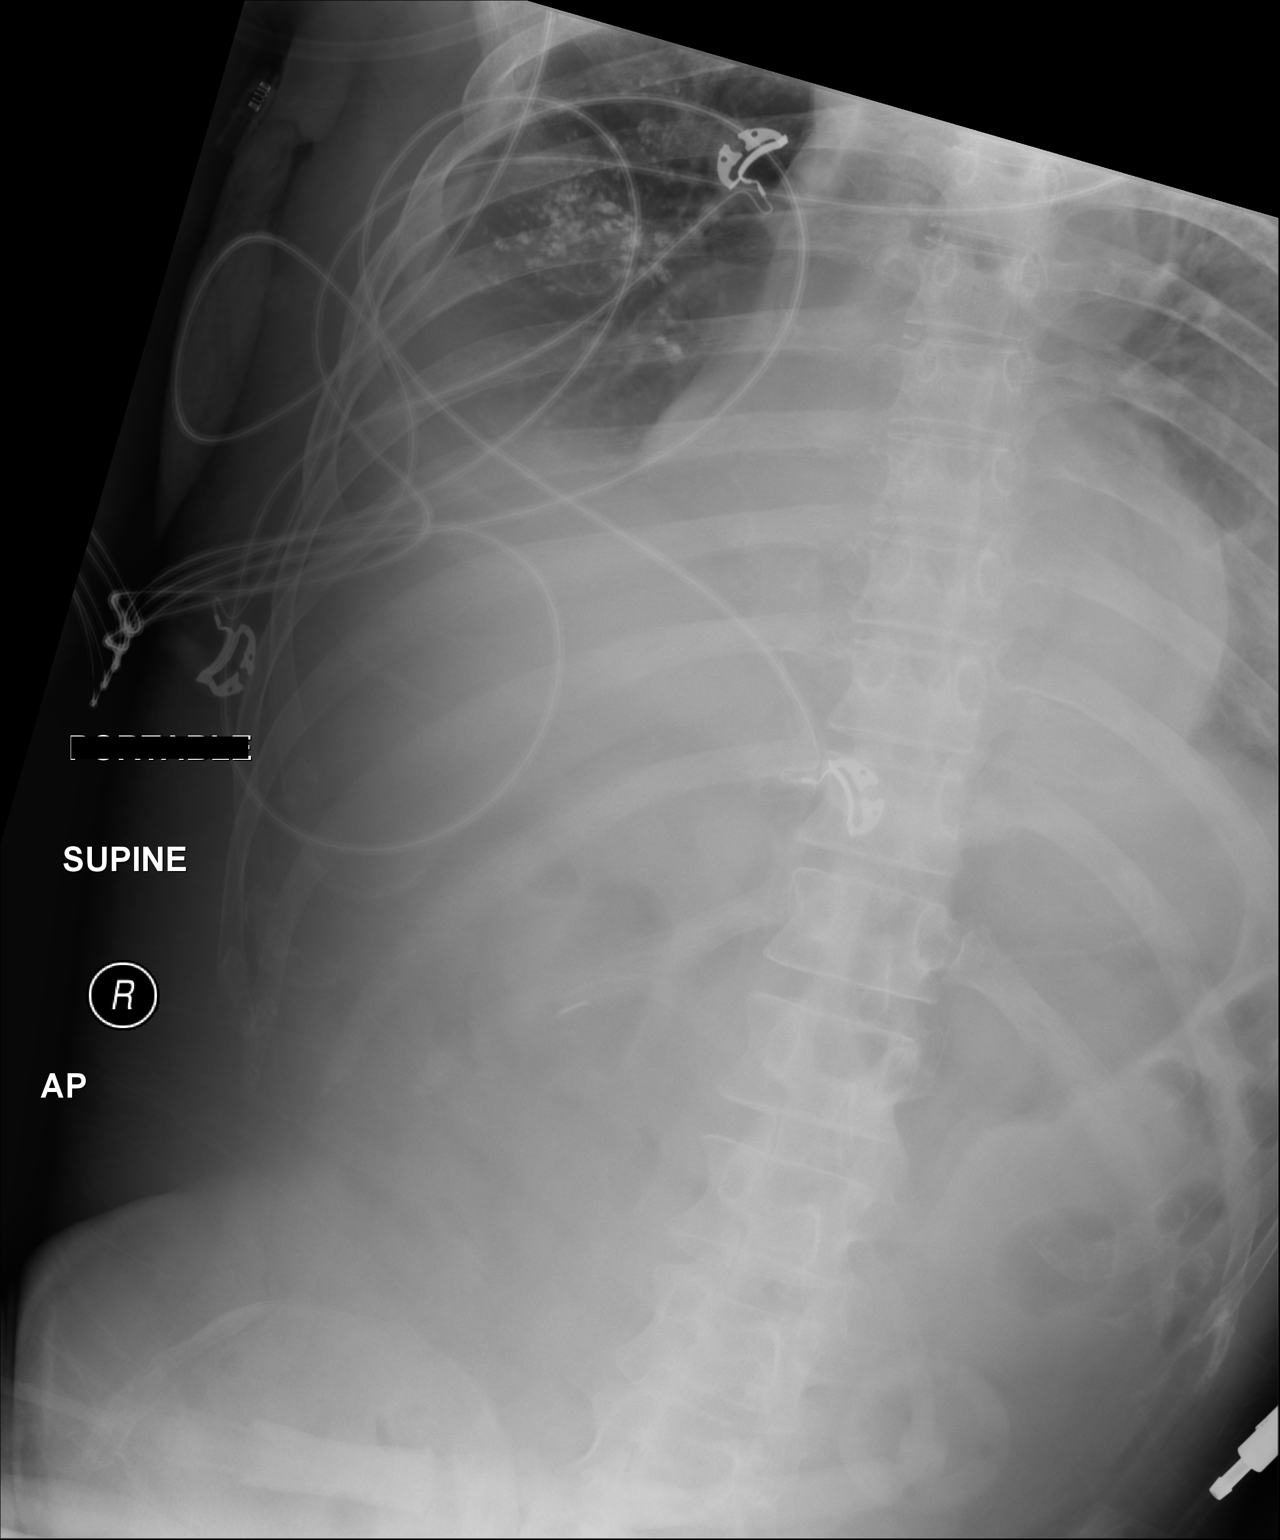

[1 of 1 positions shown; findings below may reference images not displayed]

FINDINGS: The visualized bowel gas pattern is unremarkable. Scattered air and
stool filled loops of colon are seen; no abnormal dilatation of
small bowel loops is seen to suggest small bowel obstruction. No
free intra-abdominal air is identified, though evaluation for free
air is limited on a single supine view. The stomach is partially
filled with air.

Scattered focal densities at the right mid chest may be within the
soft tissues or may reflect chronic parenchymal calcification. Clips
are noted within the right upper quadrant, reflecting prior
cholecystectomy.

The visualized osseous structures are within normal limits; the
sacroiliac joints are unremarkable in appearance. Small bilateral
pleural effusions are seen.
IMPRESSION: 1. Unremarkable bowel gas pattern; no free intra-abdominal air seen.
The stomach is partially filled with air.
2. Small bilateral pleural effusions noted.

## 2018-02-19 IMAGING — CR DG CHEST 1V PORT
1 series · 1 of 1 positions shown · non-contrast
Comparison: Chest radiograph performed 06/04/2016

CLINICAL DATA: Acute onset of vomiting an shortness of breath.
Assess for pneumonia. Initial encounter.

EXAM:
PORTABLE CHEST 1 VIEW

[AP]
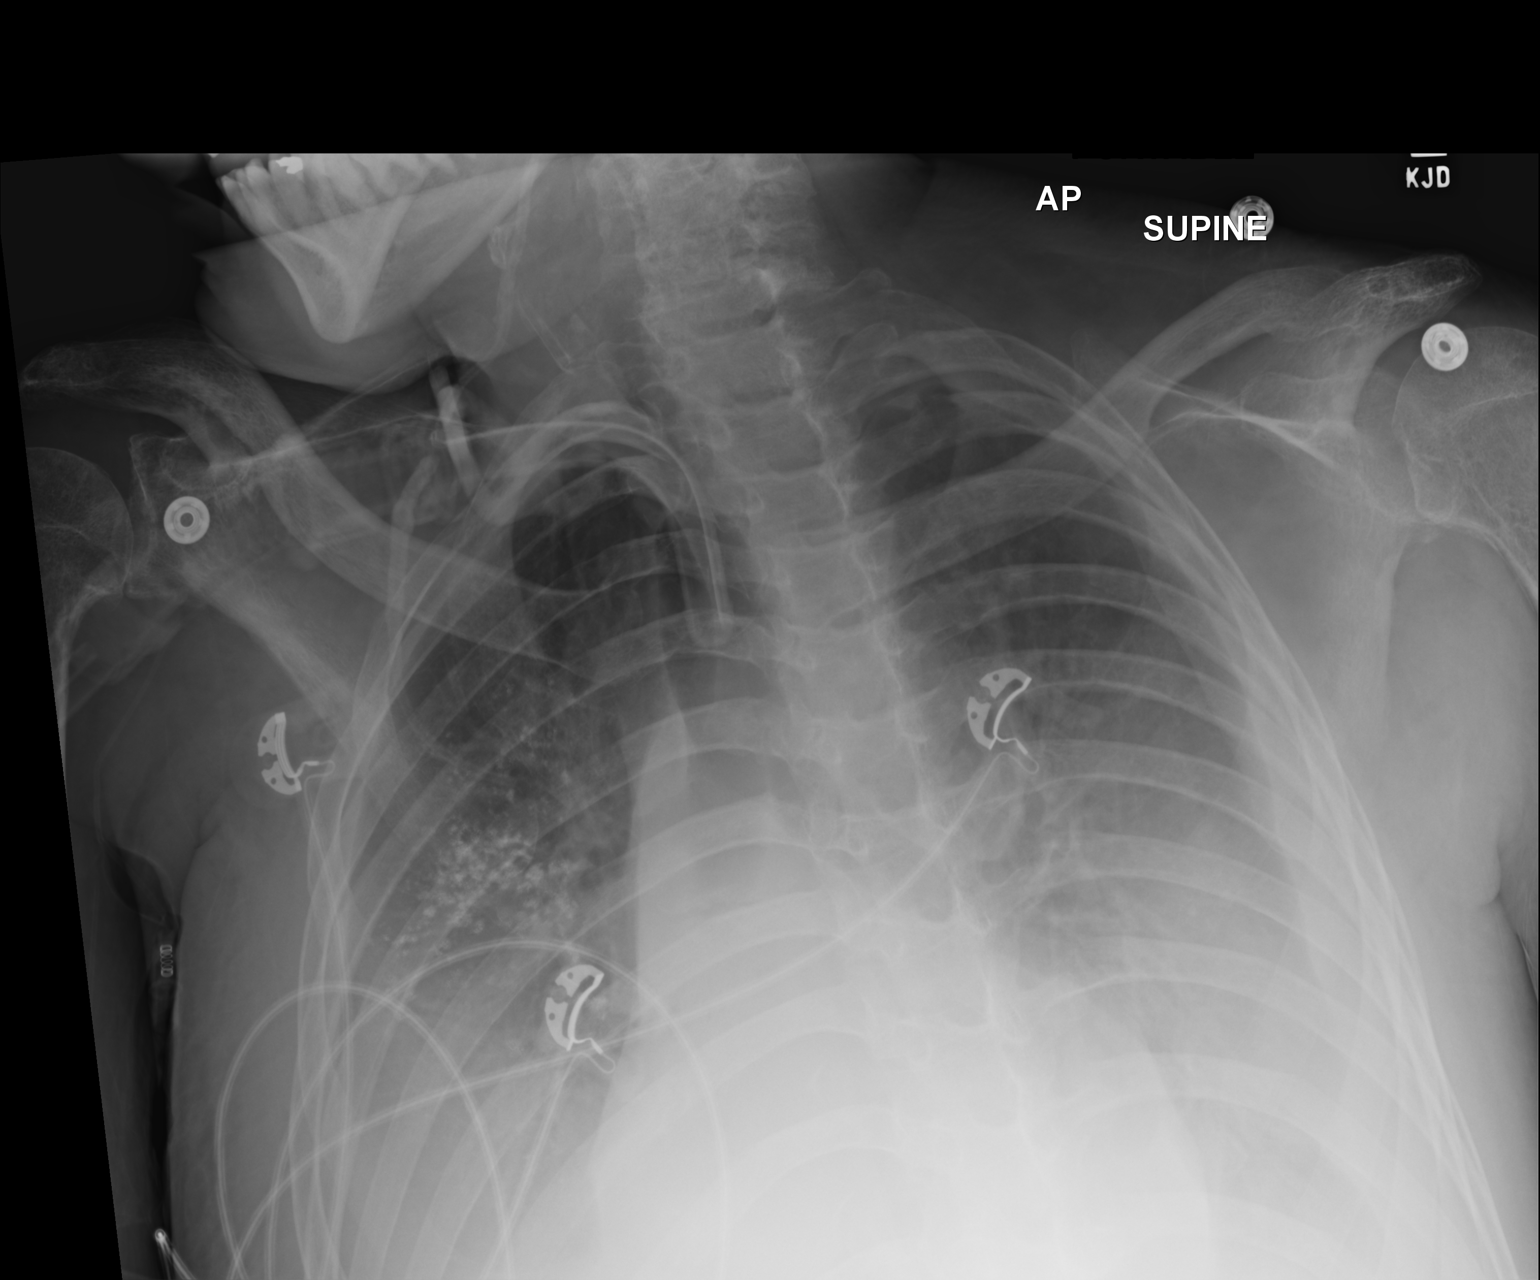

[1 of 1 positions shown; findings below may reference images not displayed]

FINDINGS: Small bilateral pleural effusions are noted, left greater than
right, with bibasilar airspace opacities, concerning for pulmonary
edema. This is relatively similar to the recent prior study.
Scattered calcification is again noted overlying the right midlung
zone. No pneumothorax is seen.

The tracheostomy tube is seen ending 4-5 cm above the carina.

The cardiomediastinal silhouette normal is borderline enlarged. No
acute osseous abnormalities are seen.
IMPRESSION: Small bilateral pleural effusions, left greater than right, with
bibasilar airspace opacities, concerning for pulmonary edema. This
is relatively similar to the recent prior study. Borderline
cardiomegaly.

## 2018-09-09 ENCOUNTER — Emergency Department (HOSPITAL_COMMUNITY): Payer: Medicare Other

## 2018-09-09 ENCOUNTER — Other Ambulatory Visit: Payer: Self-pay

## 2018-09-09 ENCOUNTER — Inpatient Hospital Stay (HOSPITAL_COMMUNITY)
Admission: EM | Admit: 2018-09-09 | Discharge: 2018-09-15 | DRG: 871 | Disposition: A | Discharge status: Skilled Nursing Facility | Payer: Medicare Other | Attending: Internal Medicine | Admitting: Internal Medicine

## 2018-09-09 DIAGNOSIS — Z982 Presence of cerebrospinal fluid drainage device: Secondary | ICD-10-CM

## 2018-09-09 DIAGNOSIS — L89211 Pressure ulcer of right hip, stage 1: Secondary | ICD-10-CM | POA: Diagnosis present

## 2018-09-09 DIAGNOSIS — Z79899 Other long term (current) drug therapy: Secondary | ICD-10-CM

## 2018-09-09 DIAGNOSIS — E860 Dehydration: Secondary | ICD-10-CM | POA: Diagnosis not present

## 2018-09-09 DIAGNOSIS — L89512 Pressure ulcer of right ankle, stage 2: Secondary | ICD-10-CM | POA: Diagnosis present

## 2018-09-09 DIAGNOSIS — A419 Sepsis, unspecified organism: Secondary | ICD-10-CM | POA: Diagnosis not present

## 2018-09-09 DIAGNOSIS — I4891 Unspecified atrial fibrillation: Secondary | ICD-10-CM | POA: Diagnosis present

## 2018-09-09 DIAGNOSIS — G8250 Quadriplegia, unspecified: Secondary | ICD-10-CM | POA: Diagnosis present

## 2018-09-09 DIAGNOSIS — L89622 Pressure ulcer of left heel, stage 2: Secondary | ICD-10-CM | POA: Diagnosis present

## 2018-09-09 DIAGNOSIS — R4701 Aphasia: Secondary | ICD-10-CM | POA: Diagnosis present

## 2018-09-09 DIAGNOSIS — Z930 Tracheostomy status: Secondary | ICD-10-CM

## 2018-09-09 DIAGNOSIS — E875 Hyperkalemia: Secondary | ICD-10-CM | POA: Diagnosis present

## 2018-09-09 DIAGNOSIS — L89226 Pressure-induced deep tissue damage of left hip: Secondary | ICD-10-CM | POA: Diagnosis present

## 2018-09-09 DIAGNOSIS — J9690 Respiratory failure, unspecified, unspecified whether with hypoxia or hypercapnia: Secondary | ICD-10-CM | POA: Diagnosis present

## 2018-09-09 DIAGNOSIS — J9610 Chronic respiratory failure, unspecified whether with hypoxia or hypercapnia: Secondary | ICD-10-CM | POA: Diagnosis present

## 2018-09-09 DIAGNOSIS — N179 Acute kidney failure, unspecified: Secondary | ICD-10-CM | POA: Diagnosis present

## 2018-09-09 DIAGNOSIS — R0902 Hypoxemia: Secondary | ICD-10-CM

## 2018-09-09 DIAGNOSIS — K219 Gastro-esophageal reflux disease without esophagitis: Secondary | ICD-10-CM | POA: Diagnosis present

## 2018-09-09 DIAGNOSIS — L89612 Pressure ulcer of right heel, stage 2: Secondary | ICD-10-CM | POA: Diagnosis present

## 2018-09-09 DIAGNOSIS — T3695XA Adverse effect of unspecified systemic antibiotic, initial encounter: Secondary | ICD-10-CM | POA: Diagnosis present

## 2018-09-09 DIAGNOSIS — I5032 Chronic diastolic (congestive) heart failure: Secondary | ICD-10-CM | POA: Diagnosis present

## 2018-09-09 DIAGNOSIS — Z87440 Personal history of urinary (tract) infections: Secondary | ICD-10-CM

## 2018-09-09 DIAGNOSIS — Z881 Allergy status to other antibiotic agents status: Secondary | ICD-10-CM

## 2018-09-09 DIAGNOSIS — R6520 Severe sepsis without septic shock: Secondary | ICD-10-CM

## 2018-09-09 DIAGNOSIS — Z931 Gastrostomy status: Secondary | ICD-10-CM

## 2018-09-09 DIAGNOSIS — Z1624 Resistance to multiple antibiotics: Secondary | ICD-10-CM | POA: Diagnosis present

## 2018-09-09 DIAGNOSIS — I9589 Other hypotension: Secondary | ICD-10-CM | POA: Diagnosis present

## 2018-09-09 DIAGNOSIS — G40909 Epilepsy, unspecified, not intractable, without status epilepticus: Secondary | ICD-10-CM | POA: Diagnosis present

## 2018-09-09 DIAGNOSIS — E119 Type 2 diabetes mellitus without complications: Secondary | ICD-10-CM | POA: Diagnosis present

## 2018-09-09 DIAGNOSIS — R000 Tachycardia, unspecified: Secondary | ICD-10-CM | POA: Diagnosis present

## 2018-09-09 DIAGNOSIS — Z87820 Personal history of traumatic brain injury: Secondary | ICD-10-CM

## 2018-09-09 DIAGNOSIS — Z79891 Long term (current) use of opiate analgesic: Secondary | ICD-10-CM

## 2018-09-09 DIAGNOSIS — R1310 Dysphagia, unspecified: Secondary | ICD-10-CM | POA: Diagnosis present

## 2018-09-09 DIAGNOSIS — Z888 Allergy status to other drugs, medicaments and biological substances status: Secondary | ICD-10-CM

## 2018-09-09 DIAGNOSIS — K521 Toxic gastroenteritis and colitis: Secondary | ICD-10-CM | POA: Diagnosis present

## 2018-09-09 DIAGNOSIS — N390 Urinary tract infection, site not specified: Secondary | ICD-10-CM | POA: Diagnosis present

## 2018-09-09 LAB — URINALYSIS, ROUTINE W REFLEX MICROSCOPIC
Bilirubin Urine: NEGATIVE
Glucose, UA: NEGATIVE mg/dL
HGB URINE DIPSTICK: NEGATIVE
Ketones, ur: NEGATIVE mg/dL
Nitrite: NEGATIVE
Protein, ur: 30 mg/dL — AB
Specific Gravity, Urine: 1.018 (ref 1.005–1.030)
WBC, UA: >50 WBC/hpf — ABNORMAL HIGH (ref 0–5)
pH: 8 (ref 5.0–8.0)

## 2018-09-09 LAB — COMPREHENSIVE METABOLIC PANEL
ALK PHOS: 62 U/L (ref 38–126)
ALT: 24 U/L (ref 0–44)
AST: 34 U/L (ref 15–41)
Albumin: 2.8 g/dL — ABNORMAL LOW (ref 3.5–5.0)
Anion gap: 6 (ref 5–15)
BUN: 36 mg/dL — AB (ref 6–20)
CALCIUM: 8.8 mg/dL — AB (ref 8.9–10.3)
CHLORIDE: 105 mmol/L (ref 98–111)
CO2: 27 mmol/L (ref 22–32)
Creatinine, Ser: 1.03 mg/dL (ref 0.61–1.24)
GFR calc Af Amer: >60 mL/min (ref >60)
GFR calc non Af Amer: >60 mL/min (ref >60)
Glucose, Bld: 107 mg/dL — ABNORMAL HIGH (ref 70–99)
Potassium: 5.3 mmol/L — ABNORMAL HIGH (ref 3.5–5.1)
SODIUM: 138 mmol/L (ref 135–145)
Total Bilirubin: 0.4 mg/dL (ref 0.3–1.2)
Total Protein: 7 g/dL (ref 6.5–8.1)

## 2018-09-09 LAB — CBC WITH DIFFERENTIAL/PLATELET
Abs Immature Granulocytes: 0.04 10*3/uL (ref 0.00–0.07)
Basophils Absolute: 0 10*3/uL (ref 0.0–0.1)
Basophils Relative: 0 %
Eosinophils Absolute: 0.3 10*3/uL (ref 0.0–0.5)
Eosinophils Relative: 3 %
HCT: 40.4 % (ref 39.0–52.0)
Hemoglobin: 12.6 g/dL — ABNORMAL LOW (ref 13.0–17.0)
Immature Granulocytes: 0 %
Lymphocytes Relative: 14 %
Lymphs Abs: 1.5 10*3/uL (ref 0.7–4.0)
MCH: 30.5 pg (ref 26.0–34.0)
MCHC: 31.2 g/dL (ref 30.0–36.0)
MCV: 97.8 fL (ref 80.0–100.0)
Monocytes Absolute: 1.4 10*3/uL — ABNORMAL HIGH (ref 0.1–1.0)
Monocytes Relative: 13 %
Neutro Abs: 7.4 10*3/uL (ref 1.7–7.7)
Neutrophils Relative %: 70 %
Platelets: 236 10*3/uL (ref 150–400)
RBC: 4.13 MIL/uL — AB (ref 4.22–5.81)
RDW: 14.1 % (ref 11.5–15.5)
WBC: 10.6 10*3/uL — AB (ref 4.0–10.5)
nRBC: 0 % (ref 0.0–0.2)

## 2018-09-09 LAB — I-STAT CG4 LACTIC ACID, ED
LACTIC ACID, VENOUS: 1.03 mmol/L (ref 0.5–1.9)
Lactic Acid, Venous: 1.81 mmol/L (ref 0.5–1.9)

## 2018-09-09 LAB — I-STAT TROPONIN, ED: Troponin i, poc: 0.03 ng/mL (ref 0.00–0.08)

## 2018-09-09 MED ORDER — SODIUM CHLORIDE 0.9 % IV SOLN: 2.0000 g | Freq: Once | INTRAVENOUS | Status: DC

## 2018-09-09 MED ORDER — SODIUM CHLORIDE 0.9 % IV BOLUS
1000.0000 mL | Freq: Once | INTRAVENOUS | Status: AC
  Administered 2018-09-09: 1000 mL via INTRAVENOUS

## 2018-09-09 MED ORDER — SODIUM CHLORIDE 0.9 % IV BOLUS (SEPSIS)
1000.0000 mL | Freq: Once | INTRAVENOUS | Status: AC
  Administered 2018-09-09: 1000 mL via INTRAVENOUS

## 2018-09-09 MED ORDER — PIPERACILLIN-TAZOBACTAM 3.375 G IVPB
3.3750 g | Freq: Three times a day (TID) | INTRAVENOUS | Status: DC
  Administered 2018-09-09 – 2018-09-12 (×8): 3.375 g via INTRAVENOUS
  Filled 2018-09-09 (×9): qty 50

## 2018-09-09 MED ORDER — SODIUM CHLORIDE 0.9 % IV BOLUS (SEPSIS): 1000.0000 mL | Freq: Once | INTRAVENOUS | Status: DC

## 2018-09-09 NOTE — ED Triage Notes (Signed)
Per [facility], pt from [facility] w/ a c/o possible sepsis, fever, and increased HR. The pt had a temp of 102 earlier and was tx w/ Tylenol (unknown dosage). His HR has been tx w/ Metoprolol. [facility] staff stated that his HR continued to increase even w/ pressors which is why they called for EMS transport. EMS noted the pt to be diaphoretic. Pt's baseline mental status is unknown. He currently responds to verbal stimuli and will follow commands. He is trach dependent. Normal FiO2 is 28%. Rhonchi noted in lung fields. Pt suctioned during transport and the phlegm is yellow.   102/60 HR 90s CBG 131 SpO2 99-100% RR 30-36 non-labored
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
## 2018-09-09 NOTE — ED Notes (Signed)
Attempted in and out cath. Unable to collect urine from catheter. Condom cath placed.

## 2018-09-09 NOTE — Progress Notes (Addendum)
Pharmacy Antibiotic Note  Dean Graham is a 57 y.o. male admitted on 09/09/2018 with fever and tachycardia.  Pharmacy has been consulted for Azactam dosing.  58.7 kg, 5'10 from 2018.  Patient is nonverbal and unable to stand, so no updated height and weight available yet.  SCr 1.03, CrCL ~78 ml/min, afebrile, WBC 10.6, LA 1.03.   Patient-reported Beta-Lactam Allergy Assessment  Specific drug that caused reaction: Ceftriaxone Reaction(s) that occurred: blisters  Antibiotics tolerated in the past: . Historical data obtained from the EMR:  Ampicillin/ Sulbactam, Piperacillin/ Tazobactam and Meropenem  Information received from:  Medical record  How long ago did the reaction occur?  Within 5 years  What was done to manage the reaction?  Unknown  Based on the above interview, it has been deemed appropriate for patient to be administered Piperacillin/ Tazobactam as the risk for cross-reactivity to documented reaction is low.   Plan: Zosyn EID 3.375gm IV Q8H Monitor renal fxn, clinical progress F/U updated height and weight    Temp (24hrs), Avg:98.2 F (36.8 C), Min:98.2 F (36.8 C), Max:98.2 F (36.8 C)  Recent Labs  Lab 09/09/18 1737 09/09/18 1754  WBC 10.6*  --   CREATININE 1.03  --   LATICACIDVEN  --  1.03    CrCl cannot be calculated (Unknown ideal weight.).    Allergies  Allergen Reactions  . Ativan [Lorazepam] Other (See Comments)    unknown  . Azithromycin Other (See Comments)    Blisters all over body  . Ceftriaxone Other (See Comments)    Blisters all over body  . Vancomycin Other (See Comments)    Blisters all over body     Zosyn 12/31 >>  12/31 BCx -     Khristy Kalan D. Laney Potashang, PharmD, BCPS, BCCCP 09/09/2018, 9:51 PM

## 2018-09-10 ENCOUNTER — Other Ambulatory Visit: Payer: Self-pay

## 2018-09-10 DIAGNOSIS — R4701 Aphasia: Secondary | ICD-10-CM | POA: Diagnosis present

## 2018-09-10 DIAGNOSIS — L89226 Pressure-induced deep tissue damage of left hip: Secondary | ICD-10-CM | POA: Diagnosis present

## 2018-09-10 DIAGNOSIS — J9610 Chronic respiratory failure, unspecified whether with hypoxia or hypercapnia: Secondary | ICD-10-CM | POA: Diagnosis present

## 2018-09-10 DIAGNOSIS — E119 Type 2 diabetes mellitus without complications: Secondary | ICD-10-CM | POA: Diagnosis present

## 2018-09-10 DIAGNOSIS — N179 Acute kidney failure, unspecified: Secondary | ICD-10-CM

## 2018-09-10 DIAGNOSIS — K521 Toxic gastroenteritis and colitis: Secondary | ICD-10-CM | POA: Diagnosis present

## 2018-09-10 DIAGNOSIS — I9589 Other hypotension: Secondary | ICD-10-CM | POA: Diagnosis present

## 2018-09-10 DIAGNOSIS — N390 Urinary tract infection, site not specified: Secondary | ICD-10-CM | POA: Diagnosis present

## 2018-09-10 DIAGNOSIS — L89512 Pressure ulcer of right ankle, stage 2: Secondary | ICD-10-CM | POA: Diagnosis present

## 2018-09-10 DIAGNOSIS — E860 Dehydration: Secondary | ICD-10-CM | POA: Diagnosis present

## 2018-09-10 DIAGNOSIS — I4891 Unspecified atrial fibrillation: Secondary | ICD-10-CM | POA: Diagnosis present

## 2018-09-10 DIAGNOSIS — L89622 Pressure ulcer of left heel, stage 2: Secondary | ICD-10-CM | POA: Diagnosis present

## 2018-09-10 DIAGNOSIS — E875 Hyperkalemia: Secondary | ICD-10-CM | POA: Diagnosis present

## 2018-09-10 DIAGNOSIS — R000 Tachycardia, unspecified: Secondary | ICD-10-CM | POA: Diagnosis present

## 2018-09-10 DIAGNOSIS — G40909 Epilepsy, unspecified, not intractable, without status epilepticus: Secondary | ICD-10-CM | POA: Diagnosis present

## 2018-09-10 DIAGNOSIS — Z931 Gastrostomy status: Secondary | ICD-10-CM | POA: Diagnosis not present

## 2018-09-10 DIAGNOSIS — A419 Sepsis, unspecified organism: Secondary | ICD-10-CM | POA: Diagnosis present

## 2018-09-10 DIAGNOSIS — J9611 Chronic respiratory failure with hypoxia: Secondary | ICD-10-CM | POA: Diagnosis not present

## 2018-09-10 DIAGNOSIS — R1310 Dysphagia, unspecified: Secondary | ICD-10-CM | POA: Diagnosis present

## 2018-09-10 DIAGNOSIS — T3695XA Adverse effect of unspecified systemic antibiotic, initial encounter: Secondary | ICD-10-CM | POA: Diagnosis present

## 2018-09-10 DIAGNOSIS — G8250 Quadriplegia, unspecified: Secondary | ICD-10-CM | POA: Diagnosis present

## 2018-09-10 DIAGNOSIS — K219 Gastro-esophageal reflux disease without esophagitis: Secondary | ICD-10-CM | POA: Diagnosis present

## 2018-09-10 DIAGNOSIS — Z1624 Resistance to multiple antibiotics: Secondary | ICD-10-CM | POA: Diagnosis present

## 2018-09-10 DIAGNOSIS — L89211 Pressure ulcer of right hip, stage 1: Secondary | ICD-10-CM | POA: Diagnosis present

## 2018-09-10 DIAGNOSIS — L89612 Pressure ulcer of right heel, stage 2: Secondary | ICD-10-CM | POA: Diagnosis present

## 2018-09-10 DIAGNOSIS — I5032 Chronic diastolic (congestive) heart failure: Secondary | ICD-10-CM | POA: Diagnosis present

## 2018-09-10 LAB — CBC
HCT: 38.8 % — ABNORMAL LOW (ref 39.0–52.0)
Hemoglobin: 12.2 g/dL — ABNORMAL LOW (ref 13.0–17.0)
MCH: 30.9 pg (ref 26.0–34.0)
MCHC: 31.4 g/dL (ref 30.0–36.0)
MCV: 98.2 fL (ref 80.0–100.0)
Platelets: 233 10*3/uL (ref 150–400)
RBC: 3.95 MIL/uL — ABNORMAL LOW (ref 4.22–5.81)
RDW: 13.9 % (ref 11.5–15.5)
WBC: 9.3 10*3/uL (ref 4.0–10.5)
nRBC: 0 % (ref 0.0–0.2)

## 2018-09-10 LAB — PROCALCITONIN: Procalcitonin: 1.15 ng/mL

## 2018-09-10 LAB — BASIC METABOLIC PANEL
Anion gap: 7 (ref 5–15)
BUN: 32 mg/dL — ABNORMAL HIGH (ref 6–20)
CO2: 28 mmol/L (ref 22–32)
Calcium: 8.6 mg/dL — ABNORMAL LOW (ref 8.9–10.3)
Chloride: 106 mmol/L (ref 98–111)
Creatinine, Ser: 0.88 mg/dL (ref 0.61–1.24)
GFR calc non Af Amer: >60 mL/min (ref >60)
Glucose, Bld: 102 mg/dL — ABNORMAL HIGH (ref 70–99)
Potassium: 4.6 mmol/L (ref 3.5–5.1)
Sodium: 141 mmol/L (ref 135–145)

## 2018-09-10 LAB — STREP PNEUMONIAE URINARY ANTIGEN: STREP PNEUMO URINARY ANTIGEN: NEGATIVE

## 2018-09-10 LAB — PREALBUMIN: Prealbumin: 9.1 mg/dL — ABNORMAL LOW (ref 18–38)

## 2018-09-10 LAB — LACTIC ACID, PLASMA
Lactic Acid, Venous: 1.2 mmol/L (ref 0.5–1.9)
Lactic Acid, Venous: 1.8 mmol/L (ref 0.5–1.9)

## 2018-09-10 MED ORDER — CHLORHEXIDINE GLUCONATE 0.12 % MT SOLN
15.0000 mL | Freq: Two times a day (BID) | OROMUCOSAL | Status: DC
  Administered 2018-09-10 – 2018-09-15 (×11): 15 mL via OROMUCOSAL
  Filled 2018-09-10 (×11): qty 15

## 2018-09-10 MED ORDER — MIDODRINE HCL 5 MG PO TABS
10.0000 mg | ORAL_TABLET | Freq: Three times a day (TID) | ORAL | Status: DC
  Administered 2018-09-10 – 2018-09-13 (×9): 10 mg
  Filled 2018-09-10 (×13): qty 2

## 2018-09-10 MED ORDER — SACCHAROMYCES BOULARDII 250 MG PO CAPS
250.0000 mg | ORAL_CAPSULE | Freq: Two times a day (BID) | ORAL | Status: DC
  Administered 2018-09-10 – 2018-09-15 (×11): 250 mg
  Filled 2018-09-10 (×11): qty 1

## 2018-09-10 MED ORDER — OSMOLITE 1.5 CAL PO LIQD
1320.0000 mL | ORAL | Status: DC
  Administered 2018-09-10 – 2018-09-11 (×2): 1320 mL
  Filled 2018-09-10 (×3): qty 2000

## 2018-09-10 MED ORDER — ENOXAPARIN SODIUM 40 MG/0.4ML ~~LOC~~ SOLN
40.0000 mg | SUBCUTANEOUS | Status: DC
  Administered 2018-09-10 – 2018-09-14 (×5): 40 mg via SUBCUTANEOUS
  Filled 2018-09-10 (×7): qty 0.4

## 2018-09-10 MED ORDER — ONDANSETRON HCL 4 MG PO TABS: 4.0000 mg | ORAL_TABLET | Freq: Four times a day (QID) | ORAL | Status: DC | PRN

## 2018-09-10 MED ORDER — DIAZEPAM 5 MG PO TABS
5.0000 mg | ORAL_TABLET | Freq: Every day | ORAL | Status: DC
  Administered 2018-09-10 – 2018-09-14 (×5): 5 mg via ORAL
  Filled 2018-09-10 (×5): qty 1

## 2018-09-10 MED ORDER — FREE WATER
350.0000 mL | Freq: Four times a day (QID) | Status: DC
  Administered 2018-09-10 – 2018-09-13 (×12): 350 mL

## 2018-09-10 MED ORDER — LACOSAMIDE 50 MG PO TABS
100.0000 mg | ORAL_TABLET | Freq: Two times a day (BID) | ORAL | Status: DC
  Administered 2018-09-10 – 2018-09-15 (×11): 100 mg
  Filled 2018-09-10 (×11): qty 2

## 2018-09-10 MED ORDER — ACETAMINOPHEN 650 MG RE SUPP: 650.0000 mg | Freq: Four times a day (QID) | RECTAL | Status: DC | PRN

## 2018-09-10 MED ORDER — SODIUM CHLORIDE 0.9% FLUSH
3.0000 mL | Freq: Two times a day (BID) | INTRAVENOUS | Status: DC
  Administered 2018-09-10 – 2018-09-15 (×12): 3 mL via INTRAVENOUS

## 2018-09-10 MED ORDER — IPRATROPIUM-ALBUTEROL 0.5-2.5 (3) MG/3ML IN SOLN: 3.0000 mL | RESPIRATORY_TRACT | Status: DC | PRN

## 2018-09-10 MED ORDER — DIAZEPAM 2 MG PO TABS
2.0000 mg | ORAL_TABLET | Freq: Every day | ORAL | Status: DC
  Administered 2018-09-10 – 2018-09-15 (×6): 2 mg
  Filled 2018-09-10 (×6): qty 1

## 2018-09-10 MED ORDER — ONDANSETRON HCL 4 MG/2ML IJ SOLN: 4.0000 mg | Freq: Four times a day (QID) | INTRAMUSCULAR | Status: DC | PRN

## 2018-09-10 MED ORDER — LEVETIRACETAM 100 MG/ML PO SOLN
1000.0000 mg | Freq: Two times a day (BID) | ORAL | Status: DC
  Administered 2018-09-10 – 2018-09-15 (×11): 1000 mg
  Filled 2018-09-10 (×13): qty 10

## 2018-09-10 MED ORDER — ACETAMINOPHEN 325 MG PO TABS: 650.0000 mg | ORAL_TABLET | Freq: Four times a day (QID) | ORAL | Status: DC | PRN

## 2018-09-10 MED ORDER — FAMOTIDINE 20 MG PO TABS: 20.0000 mg | ORAL_TABLET | Freq: Two times a day (BID) | ORAL | Status: DC

## 2018-09-10 MED ORDER — PRO-STAT SUGAR FREE PO LIQD
60.0000 mL | Freq: Every day | ORAL | Status: DC
  Administered 2018-09-10: 60 mL
  Filled 2018-09-10: qty 60

## 2018-09-10 MED ORDER — SODIUM CHLORIDE 0.9 % IV SOLN
INTRAVENOUS | Status: DC
  Administered 2018-09-10 – 2018-09-11 (×2): via INTRAVENOUS

## 2018-09-10 MED ORDER — TRAMADOL HCL 50 MG PO TABS
50.0000 mg | ORAL_TABLET | Freq: Two times a day (BID) | ORAL | Status: DC
  Administered 2018-09-10 – 2018-09-15 (×11): 50 mg
  Filled 2018-09-10 (×11): qty 1

## 2018-09-10 NOTE — Progress Notes (Signed)
RT collected sputum and sent down to the lab at this time.

## 2018-09-10 NOTE — H&P (Signed)
History and Physical    Dean Graham ZOX:[id number] DOB: [birth year] DOA: 09/09/2018  Referring MD/NP/PA: Cathren LaineKevin Steinl, MD PCP: Hillary Bowrowley, McKay, MD  Patient coming from: Transfer from [facility]  Chief Complaint: Fever  I have personally briefly reviewed [facility]   HPI: Dean Altnthony Tippins is a 58 y.o. male with medical history significant of traumatic brain injury with quadriplegia, tracheostomy/PEG dependent, diastolic CHF, chronic indwelling Foley catheter, frequent UTIs, aspiration pneumonia, and seizure disorder; who presents with from [facility] for fevers.  At baseline patient is aphasic and history is obtained from review of records.  Prior to arrival patient noted to have fevers up to 102 F but heart rate treated with Tylenol and metoprolol.  Patient reportedly has been having yellow secretions from trach.  ED Course: Patient into the emergency department patient was noted to be afebrile, pulse 85-100, respiration 12-27, blood pressure 87/64- 114/80, and saturations 96-100%.  Labs revealed WBC 10.6, hemoglobin 12.6, potassium 5.3, BUN 36, creatinine 1.03, lactic acid 1.03, and repeat 1.81.  Chest x-ray showing bibasilar atelectasis right greater than left.  Urinalysis was positive for large leukocytes many bacteria and greater than 50 WBCs.  Of sepsis protocol has been initiated.  Patient was given 3 L of normal saline IV fluids and started on empiric antibiotics of Zosyn.  TRH called to admit.  Review of Systems  Unable to perform ROS: Patient nonverbal    Past Medical History:  Diagnosis Date  . Atrial fibrillation (HCC)   . CHF (congestive heart failure) (HCC)   . Chronic respiratory failure (HCC)   . Diabetes mellitus without complication (HCC)   . Diastolic heart failure (HCC)   . Dysphagia   . GERD (gastroesophageal reflux disease)   . Quadriplegia (HCC)   . Seizures (HCC)     Past Surgical History:  Procedure Laterality Date  .  IR GASTROSTOMY TUBE MOD SED  01/11/2017     reports that he has never smoked. He has never used smokeless tobacco. He reports that he does not drink alcohol or use drugs.  Allergies  Allergen Reactions  . Ativan [Lorazepam] Other (See Comments)    unknown  . Azithromycin Other (See Comments)    Blisters all over body  . Ceftriaxone Other (See Comments)    Blisters all over body  . Vancomycin Other (See Comments)    Blisters all over body    No family history on file.  Prior to Admission medications   Medication Sig Start Date End Date Taking? Authorizing Provider  acetaminophen (TYLENOL) 325 MG tablet Place 2 tablets (650 mg total) into feeding tube every 6 (six) hours as needed for mild pain (or Fever >/= 101). 04/15/17   Lonia BloodMcClung, Jeffrey T, MD  Amino Acids-Protein Hydrolys (FEEDING SUPPLEMENT, PRO-STAT SUGAR FREE 64,) LIQD Place 60 mLs into feeding tube daily. Patient taking differently: Place 60 mLs into feeding tube at bedtime.  04/16/17   Lonia BloodMcClung, Jeffrey T, MD  chlorhexidine (PERIDEX) 0.12 % solution Use as directed 15 mLs in the mouth or throat 2 (two) times daily. Patient taking differently: Use as directed 15 mLs in the mouth or throat 2 (two) times daily. Every shift 04/15/17   Lonia BloodMcClung, Jeffrey T, MD  Chloroxylenol-Zinc Oxide [facility](BAZA EX) Apply topically See admin instructions. Apply Baza barrier cream to peri area for redness with incontinence care - every shift    [provider]  diazepam (VALIUM) 2 MG tablet Place 1 tablet (2 mg total) into feeding tube  2 (two) times daily. Patient taking differently: Place 2 mg into feeding tube every 12 (twelve) hours. For seizure 04/15/17   Lonia Blood, MD  enoxaparin (LOVENOX) 40 MG/0.4ML injection Inject 0.4 mLs (40 mg total) into the skin at bedtime. 04/15/17   Lonia Blood, MD  famotidine (PEPCID) 20 MG tablet Place 1 tablet (20 mg total) into feeding tube 2 (two) times daily. Patient taking differently: Place 20 mg into  feeding tube every 12 (twelve) hours.  04/15/17   Lonia Blood, MD  lacosamide 100 MG TABS Place 1 tablet (100 mg total) into feeding tube 2 (two) times daily. Patient taking differently: Place 100 mg into feeding tube 2 (two) times daily. For seizure disorder 04/15/17   Lonia Blood, MD  levETIRAcetam (KEPPRA) 100 MG/ML solution Place 10 mLs (1,000 mg total) into feeding tube 2 (two) times daily. Patient taking differently: Place 1,000 mg into feeding tube 2 (two) times daily. For seizure 04/15/17   Lonia Blood, MD  linezolid (ZYVOX) 600 MG/300ML IVPB Inject 300 mLs (600 mg total) into the vein every 12 (twelve) hours. 08/06/17   Duayne Cal, NP  midodrine (PROAMATINE) 10 MG tablet Place 1 tablet (10 mg total) into feeding tube 3 (three) times daily with meals. 04/15/17   Lonia Blood, MD  Multiple Vitamin (MULTIVITAMIN WITH MINERALS) TABS tablet Place 1 tablet into feeding tube daily. 04/16/17   Lonia Blood, MD  Nutritional Supplements (FEEDING SUPPLEMENT, OSMOLITE 1.5 CAL,) LIQD Place 1,000 mLs into feeding tube continuous. Patient taking differently: Place 1,320 mLs into feeding tube See admin instructions. Start time 10am/ 66 ml/hr via pump per G-tube. Run until 0600 or until total volume of 1320 ml is infused 04/15/17   Lonia Blood, MD  nystatin cream (MYCOSTATIN) Apply 1 application topically See admin instructions. Apply to groin topically every day and night shift for yeast 07/28/17   [provider]  ondansetron (ZOFRAN) 4 MG tablet Place 1 tablet (4 mg total) into feeding tube every 6 (six) hours as needed for nausea. 04/15/17   Lonia Blood, MD  piperacillin-tazobactam (ZOSYN) 3.375 GM/50ML IVPB Inject 50 mLs (3.375 g total) into the vein every 8 (eight) hours. 08/06/17   Duayne Cal, NP  saccharomyces boulardii (FLORASTOR) 250 MG capsule Place 1 capsule (250 mg total) into feeding tube 2 (two) times daily. 04/15/17   Lonia Blood, MD    traMADol (ULTRAM) 50 MG tablet Place 1 tablet (50 mg total) into feeding tube 2 (two) times daily. 04/15/17   Lonia Blood, MD  Water For Irrigation, Sterile (FREE WATER) SOLN Place 350 mLs into feeding tube every 6 (six) hours. 04/15/17   Lonia Blood, MD    Physical Exam:  Constitutional: Ill-appearing male who appears to be in some discomfort Vitals:   09/10/18 0015 09/10/18 0030 09/10/18 0130 09/10/18 0145  BP: 102/74 100/73 99/73 98/72   Pulse: 96 97 100 94  Resp: 15 16 14  (!) 22  Temp:      TempSrc:      SpO2: 99% 100% 100% 100%   Eyes: Currently not opening eyes. ENMT: Mucous membranes are dry.  Poor dentition. Neck: normal, supple, no masses, no thyromegaly.  Trach in place with thick secretions Respiratory: Coarse rhonchorous breath sounds bilaterally.  Patient coughing intermittently. Cardiovascular: Tachycardic, no murmurs / rubs / gallops. No extremity edema. 1+ pedal pulses. No carotid bruits.  Abdomen: no tenderness, no masses palpated. No hepatosplenomegaly. Bowel sounds  positive.  PEG tube and Foley catheter in place Musculoskeletal: no clubbing / cyanosis.  Muscle contractures noted at the upper and lower extremities. Skin: Diaphoretic. Neurologic: Patient is quadriplegic Psychiatric: Unable to assess.    Labs on Admission: I have personally reviewed following labs and imaging studies  CBC: Recent Labs  Lab 09/09/18 1737  WBC 10.6*  NEUTROABS 7.4  HGB 12.6*  HCT 40.4  MCV 97.8  PLT 236   Basic Metabolic Panel: Recent Labs  Lab 09/09/18 1737  NA 138  K 5.3*  CL 105  CO2 27  GLUCOSE 107*  BUN 36*  CREATININE 1.03  CALCIUM 8.8*   GFR: CrCl cannot be calculated (Unknown ideal weight.). Liver Function Tests: Recent Labs  Lab 09/09/18 1737  AST 34  ALT 24  ALKPHOS 62  BILITOT 0.4  PROT 7.0  ALBUMIN 2.8*   No results for input(s): LIPASE, AMYLASE in the last 168 hours. No results for input(s): AMMONIA in the last 168  hours. Coagulation Profile: No results for input(s): INR, PROTIME in the last 168 hours. Cardiac Enzymes: No results for input(s): CKTOTAL, CKMB, CKMBINDEX, TROPONINI in the last 168 hours. BNP (last 3 results) No results for input(s): PROBNP in the last 8760 hours. HbA1C: No results for input(s): HGBA1C in the last 72 hours. CBG: No results for input(s): GLUCAP in the last 168 hours. Lipid Profile: No results for input(s): CHOL, HDL, LDLCALC, TRIG, CHOLHDL, LDLDIRECT in the last 72 hours. Thyroid Function Tests: No results for input(s): TSH, T4TOTAL, FREET4, T3FREE, THYROIDAB in the last 72 hours. Anemia Panel: No results for input(s): VITAMINB12, FOLATE, FERRITIN, TIBC, IRON, RETICCTPCT in the last 72 hours. Urine analysis:    Component Value Date/Time   COLORURINE YELLOW 09/09/2018 2223   APPEARANCEUR CLOUDY (A) 09/09/2018 2223   LABSPEC 1.018 09/09/2018 2223   PHURINE 8.0 09/09/2018 2223   GLUCOSEU NEGATIVE 09/09/2018 2223   HGBUR NEGATIVE 09/09/2018 2223   BILIRUBINUR NEGATIVE 09/09/2018 2223   KETONESUR NEGATIVE 09/09/2018 2223   PROTEINUR 30 (A) 09/09/2018 2223   NITRITE NEGATIVE 09/09/2018 2223   LEUKOCYTESUR LARGE (A) 09/09/2018 2223   Sepsis Labs: No results found for this or any previous visit (from the past 240 hour(s)).   Radiological Exams on Admission: Dg Chest Port 1 View  Result Date: 09/09/2018 CLINICAL DATA:  Fever. EXAM: PORTABLE CHEST 1 VIEW COMPARISON:  Chest x-ray dated 08/06/2017 and chest CT dated 08/05/2017 FINDINGS: Tracheostomy tube in place. Heart size and vascularity are normal. There is focal atelectasis at the right lung base medially, slightly more prominent than on the prior chest x-ray. There are chronic small calcifications in the right midzone, stable. Minimal linear atelectasis at the left lung base. No effusions. IMPRESSION: Bibasilar atelectasis, right greater than left. Electronically Signed   By: Francene BoyersJames  Maxwell M.D.   On: 09/09/2018  18:31    EKG: Independently reviewed.  Sinus rhythm at 90 bpm  Assessment/Plan Sepsis 2/2 urinary tract infection: Patient presents with reports of fever prior to arrival up to 102 F, tachycardia, WBC 10.6, and lactic acid reassuring.  Urinalysis for signs of infection.  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 - Admit to a progressive bed - Follow-up blood, urine, and sputum cultures - Add on procalcitonin - Continue empiric antibiotics of Zosyn - Consider need of consult to infectious disease in a.m.  Acute kidney injury: Acute.  Patient presents with elevated creatinine of 1.03 and BUN 36.  At baseline patient creatinine noted to be  around 0.6-0.7. suspect prerenal cause of symptoms. - IV fluids as tolerated - Check kidney function in a.m.  Chronic hypotension: Patient initially presented with soft blood pressure - Continue midodrine every 8 hours - Held metoprolol  Hyperkalemia: Initial potassium noted to be 5.3 on admission. - Recheck potassium in a.m.  Diastolic CHF.  Patient appears to be clinically dry at this time.  Last echocardiogram noted EF 65 to 70% in 05/2016. - Strict intake and output - Daily weights   History of TBI with quadriplegia, chronic respiratory failure trach dependent - Respiratory therapy consult - Continuous pulse oximetry with oxygen as needed  Dysphagia/PEG tube - Continue home feeding regimen - Nutrition consult in a.m. for adjustments to current regimen  History of seizure disorder - Continue Keppra and Lacosamide  GERD - Continue Pepcid  DVT prophylaxis: lovenox Code Status: Full  Family Communication: No family present at bedside Disposition Plan: Discharge back to LTAC once medically stable Consults called: none  Admission status: inpatient Clydie Braun MD [facility] Pager [phone]   If 7PM-7AM, please contact night-coverage [email or web address] Password [email or web address]  09/10/2018, 2:14 AM

## 2018-09-10 NOTE — ED Notes (Signed)
Attempted report x 2 

## 2018-09-10 NOTE — ED Notes (Signed)
Call mom and give an update of pt @   [phone]

## 2018-09-10 NOTE — Progress Notes (Signed)
Patient is a 59 year old male with past medical history of traumatic brain injury with quadriplegia, tracheostomy/PEG dependent, diastolic CHF, chronic indwelling Foley catheter, frequent UTIs, aspiration pneumonia, seizure disorder who was brought from [facility] for further evaluation of fevers.  Patient was febrile on presentation.  Also noted to be hypotensive on arrival.  Chest x-ray did not show any pneumonia.  Urinalysis positive for gross urinary tract infection.  Urine culture sent.  Started on Zosyn. Patient seen and examined the bedside in the emergency department.  Currently he is hemodynamically stable.  Mild tachycardia.  Patient is nonverbal at baseline. We will continue to monitor the patient.  Patient seen by Dr. Katrinka Blazing this morning.  I will start him on gentle IV fluids. We will check urine culture and consider ID consultation if needed.  His last urine culture showed Proteus mirabilis which was sensitive to most of the antibiotics except for Cipro and nitrofurantoin.

## 2018-09-10 NOTE — ED Notes (Signed)
This nurse along with Pharmacy Tech Leotis Shames) attempted to call [facility] multiple times in order to receive mediation information for this patient.  Facility has not answered. All medications are waiting for pharmacy verification, pending paperwork from [facility].

## 2018-09-11 LAB — URINE CULTURE

## 2018-09-11 MED ORDER — OSMOLITE 1.5 CAL PO LIQD
1000.0000 mL | ORAL | Status: DC
  Administered 2018-09-11 – 2018-09-12 (×2): 1000 mL
  Filled 2018-09-11 (×7): qty 1000

## 2018-09-11 MED ORDER — PRO-STAT SUGAR FREE PO LIQD
60.0000 mL | Freq: Two times a day (BID) | ORAL | Status: DC
  Administered 2018-09-11 – 2018-09-15 (×8): 60 mL
  Filled 2018-09-11 (×8): qty 60

## 2018-09-11 NOTE — NC FL2 (Signed)
[place] MEDICAID FL2 LEVEL OF CARE SCREENING TOOL     IDENTIFICATION  Patient Name: Dean Graham Birthdate: 1961-04-02 Sex: male Admission Date (Current Location): 09/09/2018  [facility] and [place] Number:  Producer, television/film/video and Address:  [facility] [place], [address] [place], [place] [address]      Provider Number: [id number]  Attending Physician Name and Address:  Rolly Salter, MD  Relative Name and Phone Number:       Current Level of Care: Hospital Recommended Level of Care: Skilled Nursing Facility Prior Approval Number:    Date Approved/Denied:   PASRR Number:    Discharge Plan: SNF    Current Diagnoses: Patient Active Problem List   Diagnosis Date Noted  . Sepsis (HCC) 09/10/2018  . Hyperkalemia 09/10/2018  . Chronic diastolic heart failure (HCC) 04/09/2017  . GERD (gastroesophageal reflux disease) 04/09/2017  . Atrial fibrillation, currently in sinus rhythm 04/09/2017  . Tracheostomy in place [facility]) 04/09/2017  . Chronic respiratory failure with hypoxia (HCC) 04/09/2017  . 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 Diabetes mellitus type 2, diet-controlled (HCC) 04/09/2017  . Chronic hypotension 04/09/2017  . H/O quadriplegia 04/09/2017  . AKI (acute kidney injury) (HCC) 04/09/2017  . Acute hypoxemic respiratory failure (HCC)   . Ventilator dependent (HCC)   . 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 Fever   . Leukocytosis   . Aspiration pneumonia (HCC) 01/04/2017  . Anasarca   . Hemoptysis   . Tracheostomy care (HCC)   . Hypoalbuminemia   . Cough with frothy sputum   . VAP (ventilator-associated pneumonia) (HCC)   . Carbapenem-resistant Acinetobacter baumannii infection   . History of infection by MDR Stenotrophomonas maltophilia   . History of MDR Pseudomonas aeruginosa infection   . Chronic post traumatic encephalopathy   . Acute encephalopathy   . Hypokalemia    . Hypomagnesemia   . Urinary tract infection, site not specified   . Acute on chronic respiratory failure with hypoxia (HCC)   . Dysphagia   . Decubitus ulcer of sacral area   . Encounter for wound care   . Protein-calorie malnutrition, severe (HCC)   . Palliative care encounter   . Goals of care, counseling/discussion   . Pressure injury of skin 05/31/2016  . Quadriplegia (HCC) 05/31/2016  . Atrial fibrillation (HCC) 05/31/2016  . Diabetes mellitus without complication (HCC) 05/31/2016  . Chronic respiratory failure (HCC) 05/31/2016  . 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 Septic shock (HCC) 05/29/2016  . HCAP (healthcare-associated pneumonia)     Orientation RESPIRATION BLADDER Height & Weight     Self  Tracheostomy(Shiley, 67mm, cuffed, 5L at 28%.) External catheter, Incontinent(placed 09/10/2018) Weight: 141 lb 12.1 oz (64.3 kg) Height:  5\' 7"  (170.2 cm)(approximated)  BEHAVIORAL SYMPTOMS/MOOD NEUROLOGICAL BOWEL NUTRITION STATUS      Incontinent Diet, Feeding tube( Osmolite 1.5 formula at goal rate of 50 ml/hr x 24 hrs via PEG tube)  AMBULATORY STATUS COMMUNICATION OF NEEDS Skin   Total Care Does not communicate Normal                       Personal Care Assistance Level of Assistance  Bathing, Feeding, Dressing Bathing Assistance: Maximum assistance Feeding assistance: Maximum assistance Dressing Assistance: Maximum assistance     Functional Limitations Info  Sight, Hearing, Speech Sight Info: Adequate Hearing Info: Adequate Speech Info: Impaired(mute)    SPECIAL CARE FACTORS FREQUENCY  PT (By licensed PT), OT (By licensed OT)                    Contractures Contractures Info: Not present    Additional Factors Info  Code Status, Allergies Code Status Info: Full Code Allergies Info:  Ativan Lorazepam, Azithromycin, Ceftriaxone, Vancomycin           Current Medications (09/11/2018):  This is the current hospital active medication list Current  Facility-Administered Medications  Medication Dose Route Frequency Provider Last Rate Last Dose  . acetaminophen (TYLENOL) tablet 650 mg  650 mg Oral Q6H PRN Madelyn Flavors A, MD       Or  . acetaminophen (TYLENOL) suppository 650 mg  650 mg Rectal Q6H PRN Smith, Rondell A, MD      . chlorhexidine (PERIDEX) 0.12 % solution 15 mL  15 mL Mouth/Throat BID Katrinka Blazing, Rondell A, MD   15 mL at 09/11/18 1036  . diazepam (VALIUM) tablet 2 mg  2 mg Per Tube Daily Katrinka Blazing, Rondell A, MD   2 mg at 09/11/18 1034  . diazepam (VALIUM) tablet 5 mg  5 mg Oral QHS Smith, Rondell A, MD   5 mg at 09/10/18 2315  . enoxaparin (LOVENOX) injection 40 mg  40 mg Subcutaneous Q24H Smith, Rondell A, MD   40 mg at 09/11/18 1303  . feeding supplement (OSMOLITE 1.5 CAL) liquid 1,000 mL  1,000 mL Per Tube Continuous Rolly Salter, MD 50 mL/hr at 09/11/18 1424 1,000 mL at 09/11/18 1424  . feeding supplement (PRO-STAT SUGAR FREE 64) liquid 60 mL  60 mL Per Tube BID Rolly Salter, MD      . free water 350 mL  350 mL Per Tube Q6H Smith, Rondell A, MD   350 mL at 09/11/18 1231  . ipratropium-albuterol (DUONEB) 0.5-2.5 (3) MG/3ML nebulizer solution 3 mL  3 mL Nebulization Q4H PRN Smith, Rondell A, MD      . lacosamide (VIMPAT) tablet 100 mg  100 mg Per Tube BID Katrinka Blazing, Rondell A, MD   100 mg at 09/11/18 1034  . levETIRAcetam (KEPPRA) 100 MG/ML solution 1,000 mg  1,000 mg Per Tube BID Madelyn Flavors A, MD   1,000 mg at 09/11/18 1035  . midodrine (PROAMATINE) tablet 10 mg  10 mg Per Tube TID WC Smith, Rondell A, MD   10 mg at 09/11/18 1229  . ondansetron (ZOFRAN) tablet 4 mg  4 mg Oral Q6H PRN Madelyn Flavors A, MD       Or  . ondansetron (ZOFRAN) injection 4 mg  4 mg Intravenous Q6H PRN Smith, Rondell A, MD      . piperacillin-tazobactam (ZOSYN) IVPB 3.375 g  3.375 g Intravenous Q8H Smith, Rondell A, MD 12.5 mL/hr at 09/11/18 1306 3.375 g at 09/11/18 1306  . saccharomyces boulardii (FLORASTOR) capsule 250 mg  250 mg Per Tube BID Madelyn Flavors A, MD   250 mg at 09/11/18 1035  . sodium chloride flush (NS) 0.9 % injection 3 mL  3 mL Intravenous Q12H Smith, Rondell A, MD   3 mL at 09/11/18 1035  . traMADol (ULTRAM) tablet 50 mg  50 mg Per Tube BID Madelyn Flavors A, MD   50 mg at 09/11/18 1035     Discharge Medications: Please see discharge summary for a list of discharge medications.  Relevant Imaging Results:  Relevant Lab Results:   Additional Information SS#: [id number]  Maree Krabbe, LCSW
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
## 2018-09-12 LAB — CULTURE, RESPIRATORY W GRAM STAIN: Culture: NORMAL

## 2018-09-12 MED ORDER — AMOXICILLIN-POT CLAVULANATE 400-57 MG/5ML PO SUSR
875.0000 mg | Freq: Two times a day (BID) | ORAL | Status: DC
  Administered 2018-09-12 – 2018-09-15 (×7): 875 mg
  Filled 2018-09-12 (×7): qty 10.9

## 2018-09-12 MED ORDER — METOPROLOL TARTRATE 25 MG/10 ML ORAL SUSPENSION
25.0000 mg | Freq: Two times a day (BID) | ORAL | Status: DC
  Administered 2018-09-12 (×2): 25 mg
  Filled 2018-09-12 (×3): qty 10

## 2018-09-12 MED ORDER — GLYCOPYRROLATE 0.2 MG/ML IJ SOLN
0.1000 mg | Freq: Once | INTRAMUSCULAR | Status: AC
  Administered 2018-09-12: 0.1 mg via INTRAVENOUS
  Filled 2018-09-12: qty 1
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
## 2018-09-13 ENCOUNTER — Inpatient Hospital Stay (HOSPITAL_COMMUNITY): Payer: Medicare Other

## 2018-09-13 LAB — LEGIONELLA PNEUMOPHILA SEROGP 1 UR AG: L. pneumophila Serogp 1 Ur Ag: NEGATIVE

## 2018-09-13 MED ORDER — METOPROLOL TARTRATE 25 MG/10 ML ORAL SUSPENSION
25.0000 mg | Freq: Two times a day (BID) | ORAL | Status: DC | PRN
  Administered 2018-09-13 – 2018-09-15 (×2): 25 mg
  Filled 2018-09-13 (×4): qty 10

## 2018-09-13 MED ORDER — LOPERAMIDE HCL 1 MG/7.5ML PO SUSP
2.0000 mg | Freq: Three times a day (TID) | ORAL | Status: DC | PRN
  Administered 2018-09-13 – 2018-09-15 (×2): 2 mg via ORAL
  Filled 2018-09-13 (×4): qty 15

## 2018-09-13 MED ORDER — FREE WATER
200.0000 mL | Freq: Four times a day (QID) | Status: DC
  Administered 2018-09-13 – 2018-09-14 (×4): 200 mL

## 2018-09-13 MED ORDER — MIDODRINE HCL 5 MG PO TABS
5.0000 mg | ORAL_TABLET | Freq: Three times a day (TID) | ORAL | Status: DC
  Administered 2018-09-13 – 2018-09-15 (×6): 5 mg
  Filled 2018-09-13 (×5): qty 1

## 2018-09-13 NOTE — Progress Notes (Signed)
[facility] Progress Note  Patient: Dean Graham WHQ:[id number]   PCP: Hillary Bow, MD DOB: April 08, 1961   DOA: 09/09/2018   DOS: 09/13/2018   Date of Service: the patient was seen and examined on 09/13/2018  Brief hospital course: Pt. with PMH of TBI with cardioplegia, tracheostomy, PEG tube placement, chronic diastolic CHF, chronic indwelling Foley catheter, frequent UTI and pneumonia, seizure disorder, from [facility]; admitted on 09/09/2018, presented with complaint of fever, was found to have sepsis. Currently further plan is continue IV antibiotics and further work-up.  Subjective: Secretion is improving.  Patient reported to have some diarrhea.  No vomiting.  No abdominal pain.  More comfortable this morning.  Assessment and Plan: Sepsis 2/2 urinary tract infection: Patient presents with reports of fever prior to arrival up to 102 F, tachycardia, WBC 10.6, and lactic acid reassuring.  Urinalysis for signs of infection.  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 Urine culture is negative, blood cultures so far negative as well. Sputum culture no growth as well. Mildly elevated procalcitonin levels. Treated with IV Zosyn initially. Since cultures are remaining negative.  We will transition to oral Augmentin.  Acute kidney injury: Acute.  Patient presents with elevated creatinine of 1.03 and BUN 36.  At baseline patient creatinine noted to be around 0.6-0.7. suspect prerenal cause of symptoms. Improving with IV fluids.  We will stop the fluids for now.  Chronic hypotension: Sinus tachycardia  Patient initially presented with soft blood pressure - Continue midodrine every 8 hours - Resumed metoprolol, monitor blood pressure  Hyperkalemia: Initial potassium noted to be 5.3 on admission. Now normal.  Monitor.  Chronic diastolic CHF.  Patient appears to be clinically dry at this time.  Last echocardiogram noted EF 65 to 70% in 05/2016. - Strict  intake and output - Daily weights - Discontinue IV fluids.   History of TBI with quadriplegia, chronic respiratory failure trach dependent - Respiratory therapy consult - Continuous pulse oximetry with oxygen as needed  Dysphagia/PEG tube - Continue home feeding regimen  History of seizure disorder - Continue Keppra and Lacosamide  GERD - Continue Pepcid  Antibiotic induced diarrhea. Imodium x1.  Monitor.  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 (Active)  04/11/17 1100  Location: Hip  Location Orientation: Left  Staging: Stage IV - Full thickness tissue loss with exposed bone, tendon or muscle.  Wound Description (Comments):   Present on Admission: Yes     Pressure Injury Stage III -  Full thickness tissue loss. Subcutaneous fat may be visible but bone, tendon or muscle are NOT exposed. (Active)     Location: Hip  Location Orientation: Right  Staging: Stage III -  Full thickness tissue loss. Subcutaneous fat may be visible but bone, tendon or muscle are NOT exposed.  Wound Description (Comments):   Present on Admission: Yes     Pressure Injury 05/30/16 Stage II -  Partial thickness loss of dermis presenting as a shallow open ulcer with a red, pink wound bed without slough. bilat heels (Active)  05/30/16   Location: Heel  Location Orientation: Left  Staging: Stage II -  Partial thickness loss of dermis presenting as a shallow open ulcer with a red, pink wound bed without slough.  Wound Description (Comments): bilat heels  Present on Admission: Yes     Pressure Injury 01/04/17 Stage IV - Full thickness tissue loss with exposed bone, tendon or muscle. stage 4 LEFT hip (Active)  01/04/17 0730  Location: Hip  Location Orientation: Left  Staging: Stage IV - Full thickness tissue loss with exposed bone, tendon or muscle.  Wound Description (Comments): stage 4 LEFT hip  Present on Admission: Yes     Pressure  Injury 01/04/17 Stage II -  Partial thickness loss of dermis presenting as a shallow open ulcer with a red, pink wound bed without slough. stage 2 RIGHT ankle (Active)  01/04/17 0730  Location: Ankle  Location Orientation: Right  Staging: Stage II -  Partial thickness loss of dermis presenting as a shallow open ulcer with a red, pink wound bed without slough.  Wound Description (Comments): stage 2 RIGHT ankle  Present on Admission: Yes     Pressure Injury 01/04/17 Stage I -  Intact skin with non-blanchable redness of a localized area usually over a bony prominence. stage 1 RIGHT hip (Active)  01/04/17 0730  Location: Hip  Location Orientation: Right  Staging: Stage I -  Intact skin with non-blanchable redness of a localized area usually over a bony prominence.  Wound Description (Comments): stage 1 RIGHT hip  Present on Admission: Yes     Diet: On tube feeding, n.p.o. by mouth DVT Prophylaxis: subcutaneous Heparin  Advance goals of care discussion: FULL CODE  Family Communication: NO family was present at bedside, at the time of interview.   Disposition:  Discharge to [facility] likely tomorrow..  Consultants: NONE Procedures: NOEN  Scheduled Meds: . amoxicillin-clavulanate  875 mg Per Tube Q12H  . chlorhexidine  15 mL Mouth/Throat BID  . diazepam  2 mg Per Tube Daily  . diazepam  5 mg Oral QHS  . enoxaparin (LOVENOX) injection  40 mg Subcutaneous Q24H  . feeding supplement (PRO-STAT SUGAR FREE 64)  60 mL Per Tube BID  . free water  200 mL Per Tube Q6H  . lacosamide  100 mg Per Tube BID  . levETIRAcetam  1,000 mg Per Tube BID  . midodrine  5 mg Per Tube TID WC  . saccharomyces boulardii  250 mg Per Tube BID  . sodium chloride flush  3 mL Intravenous Q12H  . traMADol  50 mg Per Tube BID   Continuous Infusions: . feeding supplement (OSMOLITE 1.5 CAL) 1,000 mL (09/12/18 0830)   PRN Meds: acetaminophen **OR** acetaminophen, ipratropium-albuterol, loperamide HCl, metoprolol  tartrate, ondansetron **OR** ondansetron (ZOFRAN) IV Antibiotics: Anti-infectives (From admission, onward)   Start     Dose/Rate Route Frequency Ordered Stop   09/12/18 1000  amoxicillin-clavulanate (AUGMENTIN) 400-57 MG/5ML suspension 875 mg     875 mg Per Tube Every 12 hours 09/12/18 0820     09/09/18 2200  piperacillin-tazobactam (ZOSYN) IVPB 3.375 g  Status:  Discontinued     3.375 g 12.5 mL/hr over 240 Minutes Intravenous Every 8 hours 09/09/18 2154 09/12/18 0819   09/09/18 2115  aztreonam (AZACTAM) 2 g in sodium chloride 0.9 % 100 mL IVPB  Status:  Discontinued     2 g 200 mL/hr over 30 Minutes Intravenous  Once 09/09/18 2113 09/09/18 2149       Objective: Physical Exam: Vitals:   09/13/18 0839 09/13/18 1143 09/13/18 1518 09/13/18 1641  BP:    117/81  Pulse: (!) 111 (!) 53 (!) 107 (!) 106  Resp: (!) 31 (!) 22 (!) 22 17  Temp:    98.2 F (36.8 C)  TempSrc:    Axillary  SpO2: 100% 96% 99% 98%  Weight:      Height:  Intake/Output Summary (Last 24 hours) at 09/13/2018 1823 Last data filed at 09/12/2018 1900 Gross per 24 hour  Intake 500 ml  Output -  Net 500 ml   Filed Weights   09/10/18 1507 09/11/18 0535  Weight: 62.1 kg 64.3 kg   General: Alert, Awake. Appear in mild distress, affect appropriate Eyes: PERRL, Conjunctiva normal ENT: Oral Mucosa clear moist. Neck: NO JVD, NO Abnormal Mass Or lumps Cardiovascular: S1 and S2 Present, NO Murmur, Peripheral Pulses Present Respiratory: normal respiratory effort, Bilateral Air entry equal and Decreased, NO use of accessory muscle, BASAL Crackles, NO wheezes Abdomen: Bowel Sound PREsent, Soft and NO tenderness, NO hernia Skin: NO redness, NO Rash, NO induration Extremities: NO Pedal edema,  Neurologic: Chronic quadriplegia.  Data Reviewed: CBC: Recent Labs  Lab 09/09/18 1737 09/10/18 0306  WBC 10.6* 9.3  NEUTROABS 7.4  --   HGB 12.6* 12.2*  HCT 40.4 38.8*  MCV 97.8 98.2  PLT 236 233   Basic Metabolic  Panel: Recent Labs  Lab 09/09/18 1737 09/10/18 0306  NA 138 141  K 5.3* 4.6  CL 105 106  CO2 27 28  GLUCOSE 107* 102*  BUN 36* 32*  CREATININE 1.03 0.88  CALCIUM 8.8* 8.6*    Liver Function Tests: Recent Labs  Lab 09/09/18 1737  AST 34  ALT 24  ALKPHOS 62  BILITOT 0.4  PROT 7.0  ALBUMIN 2.8*   No results for input(s): LIPASE, AMYLASE in the last 168 hours. No results for input(s): AMMONIA in the last 168 hours. Coagulation Profile: No results for input(s): INR, PROTIME in the last 168 hours. Cardiac Enzymes: No results for input(s): CKTOTAL, CKMB, CKMBINDEX, TROPONINI in the last 168 hours. BNP (last 3 results) No results for input(s): PROBNP in the last 8760 hours. CBG: No results for input(s): GLUCAP in the last 168 hours. Studies: No results found.   Time spent: 35 minutes  Author: Lynden OxfordPranav Yassmin Binegar, MD [facility] Pager: [phone] 09/13/2018 6:23 PM  Between 7PM-7AM, please contact night-coverage at [email or web address], password [email or web address]

## 2018-09-13 NOTE — Clinical Social Work Note (Signed)
Clinical Social Work Assessment  Patient Details  Name: Dean Graham MRN: [id number] Date of Birth: 05-30-1961  Date of referral:  09/13/18               Reason for consult:  Discharge Planning                Permission sought to share information with:  Case Manager Permission granted to share information::  Yes, Verbal Permission Granted  Name::     Dean Graham and Dean Graham   Agency::  [facility]   Relationship::  Mom and Medical laboratory scientific officer Information:     Housing/Transportation Living arrangements for the past 2 months:  Assisted Living Facility Source of Information:  Siblings Patient Interpreter Needed:  None Criminal Activity/Legal Involvement Pertinent to Current Situation/Hospitalization:  No - Comment as needed Significant Relationships:  Parents, Siblings Lives with:  Facility Resident Do you feel safe going back to the place where you live?  Yes Need for family participation in patient care:  Yes (Comment)(Patient has POA)  Care giving concerns:    CSW spoke with patient's brother who shares POA with his mother. CSW asked if it was okay to send the patient back to [facility]. Patient's brother agreed and had no concerns about that.   Patient's brother did ask for a medical update. CSW referred the brother speaking with the nurse about specific questions that he had.    Social Worker assessment / plan:    Dean Graham has already been completed. Assessment has now been completed. Family did not have any concerns to be addressed at this time.   Employment status:  Unemployed Health and safety inspector:  Medicare PT Recommendations:  Not assessed at this time Information / Referral to community resources:     Patient/Family's Response to care:  Family is understanding about the patient's condition and is in support of the patient to be returned to [facility] once he is medically stable.   Patient/Family's Understanding of and Emotional Response to Diagnosis, Current Treatment,  and Prognosis:  Family is understanding and did not have any concerns for the social worker at this time.   Emotional Assessment Appearance:  Appears stated age Attitude/Demeanor/Rapport:  Unable to Assess Affect (typically observed):  Unable to Assess Orientation:  Oriented to Self Alcohol / Substance use:  Not Applicable Psych involvement (Current and /or in the community):  No (Comment)  Discharge Needs  Concerns to be addressed:  Discharge Planning Concerns Readmission within the last 30 days:    Current discharge risk:  Dependent with Mobility Barriers to Discharge:  No Barriers Identified   Dean Graham Dean Graham, LCSWA 09/13/2018, 9:20 AM

## 2018-09-13 NOTE — Progress Notes (Signed)
Per conversation with Dr. Allena Katz, patient should be able to discharge back to [facility] tomorrow.   CSW called and spoke with Misty Stanley at [facility]. Patient will be going to room 308 and number to report to is [phone].   CSW will need to hard fax DC summary once it has been signed by the MD.   CSW will continue to follow until the patient is ready for discharge.   Drucilla Schmidt, MSW, LCSW-A Clinical Social Worker [employer]

## 2018-09-13 NOTE — Progress Notes (Signed)
RT called to bedside by RN for desaturations.  Arrived to find pt being being bagged with BMV.  Spo2 in the 90S by this time.  Pt bagged via trach x 5-6 minutes and then placed back on trach collar 10L/60%, Spo2 100%.  Pt has copies secretions in and around trach.  Suctioned multiple times.  Pt appears to have recovered.  Will cont to monitor.

## 2018-09-13 NOTE — Significant Event (Cosign Needed)
Rapid Response Event Note  Overview: Respiratory - Hypoxia  Initial Focused Assessment: Called by nurse about patient's oxygen saturations being the 20s. I asked to the RN to assess the patient for breathing and bag the patient via BVM. When I arrived, RT was in the room, patient was being suctioned and bagged. Saturations were now 100%. Per RN and RT, patient does have copious secretions and patient has been frequently suctioned as well. Patient does not appear distress from a respiratory standpoint, patient does have significant tremors, profusely diaphoretic, and appears to be uncomfortable, could maybe be neuro storming ? Lung sounds - coarse crackles and rhonchi throughout. HR now in the mid 130s, was in the low 100s earlier, does not have fever, RR in the low 20s, stable BP. Patient was placed back on TC and saturations were still maintaining at 100%. I asked the RN to administer to nightly schedule medications as well. I paged [facility] NP at 2158 and updated her as well.  Interventions: -- STAT CXR  Plan of Care: -- RN to follow up with [facility] NP when CXR is completed.  Event Summary:    at    Call Time: 2128 End Time 2215   Ethon Wymer R

## 2018-09-14 ENCOUNTER — Other Ambulatory Visit: Payer: Self-pay

## 2018-09-14 LAB — COMPREHENSIVE METABOLIC PANEL WITH GFR
ALT: 19 U/L (ref 0–44)
AST: 21 U/L (ref 15–41)
Albumin: 2.5 g/dL — ABNORMAL LOW (ref 3.5–5.0)
Alkaline Phosphatase: 51 U/L (ref 38–126)
Anion gap: 7 (ref 5–15)
BUN: 21 mg/dL — ABNORMAL HIGH (ref 6–20)
CO2: 27 mmol/L (ref 22–32)
Calcium: 8.8 mg/dL — ABNORMAL LOW (ref 8.9–10.3)
Chloride: 105 mmol/L (ref 98–111)
Creatinine, Ser: 0.66 mg/dL (ref 0.61–1.24)
GFR calc Af Amer: >60 mL/min
GFR calc non Af Amer: >60 mL/min
Glucose, Bld: 107 mg/dL — ABNORMAL HIGH (ref 70–99)
Potassium: 4.2 mmol/L (ref 3.5–5.1)
Sodium: 139 mmol/L (ref 135–145)
Total Bilirubin: 0.2 mg/dL — ABNORMAL LOW (ref 0.3–1.2)
Total Protein: 6.4 g/dL — ABNORMAL LOW (ref 6.5–8.1)

## 2018-09-14 LAB — CBC WITH DIFFERENTIAL/PLATELET
Abs Immature Granulocytes: 0.02 K/uL (ref 0.00–0.07)
Basophils Absolute: 0 K/uL (ref 0.0–0.1)
Basophils Relative: 0 %
Eosinophils Absolute: 0.7 K/uL — ABNORMAL HIGH (ref 0.0–0.5)
Eosinophils Relative: 8 %
HCT: 37.7 % — ABNORMAL LOW (ref 39.0–52.0)
Hemoglobin: 11.8 g/dL — ABNORMAL LOW (ref 13.0–17.0)
Immature Granulocytes: 0 %
Lymphocytes Relative: 24 %
Lymphs Abs: 1.9 K/uL (ref 0.7–4.0)
MCH: 29.6 pg (ref 26.0–34.0)
MCHC: 31.3 g/dL (ref 30.0–36.0)
MCV: 94.7 fL (ref 80.0–100.0)
Monocytes Absolute: 0.8 K/uL (ref 0.1–1.0)
Monocytes Relative: 10 %
Neutro Abs: 4.5 K/uL (ref 1.7–7.7)
Neutrophils Relative %: 58 %
Platelets: 265 K/uL (ref 150–400)
RBC: 3.98 MIL/uL — ABNORMAL LOW (ref 4.22–5.81)
RDW: 13.2 % (ref 11.5–15.5)
WBC: 7.9 K/uL (ref 4.0–10.5)
nRBC: 0 % (ref 0.0–0.2)

## 2018-09-14 LAB — CULTURE, BLOOD (ROUTINE X 2)
Culture: NO GROWTH
Culture: NO GROWTH
Special Requests: ADEQUATE

## 2018-09-14 LAB — MAGNESIUM: Magnesium: 1.6 mg/dL — ABNORMAL LOW (ref 1.7–2.4)

## 2018-09-14 MED ORDER — FREE WATER
200.0000 mL | Freq: Three times a day (TID) | Status: DC
  Administered 2018-09-14 – 2018-09-15 (×4): 200 mL

## 2018-09-14 MED ORDER — DIAZEPAM 5 MG PO TABS: 5.0000 mg | ORAL_TABLET | Freq: Every day | ORAL | 0 refills | Status: DC

## 2018-09-14 MED ORDER — GLYCOPYRROLATE 0.2 MG/ML IJ SOLN
0.1000 mg | Freq: Once | INTRAMUSCULAR | Status: AC
  Administered 2018-09-14: 0.1 mg via INTRAVENOUS
  Filled 2018-09-14: qty 1

## 2018-09-14 MED ORDER — DIAZEPAM 2 MG PO TABS: 2.0000 mg | ORAL_TABLET | Freq: Every day | ORAL | 0 refills | Status: DC

## 2018-09-14 MED ORDER — GLYCOPYRROLATE 1 MG PO TABS
1.0000 mg | ORAL_TABLET | Freq: Two times a day (BID) | ORAL | Status: DC | PRN
  Administered 2018-09-14 – 2018-09-15 (×2): 1 mg via ORAL
  Filled 2018-09-14 (×2): qty 1

## 2018-09-14 MED ORDER — LOPERAMIDE HCL 1 MG/7.5ML PO SUSP: 2.0000 mg | Freq: Three times a day (TID) | ORAL | 0 refills | Status: AC | PRN

## 2018-09-14 MED ORDER — LOPERAMIDE HCL 1 MG/7.5ML PO SUSP
2.0000 mg | Freq: Once | ORAL | Status: DC
  Filled 2018-09-14: qty 15

## 2018-09-14 MED ORDER — IPRATROPIUM-ALBUTEROL 0.5-2.5 (3) MG/3ML IN SOLN: 3.0000 mL | RESPIRATORY_TRACT | 0 refills | Status: DC | PRN

## 2018-09-14 MED ORDER — GLYCOPYRROLATE 1 MG PO TABS: 1.0000 mg | ORAL_TABLET | Freq: Two times a day (BID) | ORAL | 0 refills | Status: AC | PRN

## 2018-09-14 MED ORDER — TRAMADOL HCL 50 MG PO TABS: 50.0000 mg | ORAL_TABLET | Freq: Two times a day (BID) | ORAL | 0 refills | Status: DC

## 2018-09-14 MED ORDER — LOPERAMIDE HCL 1 MG/7.5ML PO SUSP
2.0000 mg | Freq: Every day | ORAL | Status: DC
  Administered 2018-09-14 – 2018-09-15 (×2): 2 mg via ORAL
  Filled 2018-09-14 (×2): qty 15

## 2018-09-14 MED ORDER — AMOXICILLIN-POT CLAVULANATE 400-57 MG/5ML PO SUSR: 875.0000 mg | Freq: Two times a day (BID) | ORAL | 0 refills | Status: AC

## 2018-09-14 NOTE — Progress Notes (Signed)
[facility] Progress Note  Patient: Dean Graham [id number]:[id number]   PCP: Hillary Bow, MD DOB: [birth year]   DOA: 09/09/2018   DOS: 09/14/2018   Date of Service: the patient was seen and examined on 09/14/2018  Brief hospital course: Pt. with PMH of TBI with cardioplegia, tracheostomy, PEG tube placement, chronic diastolic CHF, chronic indwelling Foley catheter, frequent UTI and pneumonia, seizure disorder, from [facility]; admitted on 09/09/2018, presented with complaint of fever, was found to have sepsis. Currently further plan is continue IV antibiotics and further work-up.  Subjective: Overnight patient had an event with respiratory distress requiring bagging.  Problem was with secretion.  No nausea no vomiting.  Diarrhea still remains a problem.  Assessment and Plan: Sepsis 2/2 urinary tract infection: Patient presents with reports of fever prior to arrival up to 102 F, tachycardia, WBC 10.6, and lactic acid reassuring.  Urinalysis for signs of infection.  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 Urine culture is negative, blood cultures so far negative as well. Sputum culture no growth as well. Mildly elevated procalcitonin levels. Treated with IV Zosyn initially. Since cultures are remaining negative.  We will transition to oral Augmentin.  Acute kidney injury: Acute.  Patient presents with elevated creatinine of 1.03 and BUN 36.  At baseline patient creatinine noted to be around 0.6-0.7. suspect prerenal cause of symptoms. Improving with IV fluids.  We will stop the fluids for now. function back to baseline.  Chronic hypotension: Sinus tachycardia  Patient initially presented with soft blood pressure - Continue midodrine every 8 hours - Resumed metoprolol, monitor blood pressure  Hyperkalemia: Initial potassium noted to be 5.3 on admission. Now normal.  Monitor.  Chronic diastolic CHF.  Patient appears to be clinically dry at this time.  Last  echocardiogram noted EF 65 to 70% in 05/2016. - Strict intake and output - Daily weights - Discontinue IV fluids.   History of TBI with quadriplegia, chronic respiratory failure trach dependent - Respiratory therapy consult - Continuous pulse oximetry with oxygen as needed  Dysphagia/PEG tube - Continue home feeding regimen  History of seizure disorder - Continue Keppra and Lacosamide  GERD - Continue Pepcid  Antibiotic induced diarrhea. Imodium scheduled. Monitor.  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 (Active)  04/11/17 1100  Location: Hip  Location Orientation: Left  Staging: Stage IV - Full thickness tissue loss with exposed bone, tendon or muscle.  Wound Description (Comments):   Present on Admission: Yes     Pressure Injury Stage III -  Full thickness tissue loss. Subcutaneous fat may be visible but bone, tendon or muscle are NOT exposed. (Active)     Location: Hip  Location Orientation: Right  Staging: Stage III -  Full thickness tissue loss. Subcutaneous fat may be visible but bone, tendon or muscle are NOT exposed.  Wound Description (Comments):   Present on Admission: Yes     Pressure Injury 05/30/16 Stage II -  Partial thickness loss of dermis presenting as a shallow open ulcer with a red, pink wound bed without slough. bilat heels (Active)  05/30/16   Location: Heel  Location Orientation: Left  Staging: Stage II -  Partial thickness loss of dermis presenting as a shallow open ulcer with a red, pink wound bed without slough.  Wound Description (Comments): bilat heels  Present on Admission: Yes     Pressure Injury 01/04/17 Stage IV - Full thickness tissue loss with exposed bone, tendon  or muscle. stage 4 LEFT hip (Active)  01/04/17 0730  Location: Hip  Location Orientation: Left  Staging: Stage IV - Full thickness tissue loss with exposed bone, tendon or muscle.  Wound Description (Comments):  stage 4 LEFT hip  Present on Admission: Yes     Pressure Injury 01/04/17 Stage II -  Partial thickness loss of dermis presenting as a shallow open ulcer with a red, pink wound bed without slough. stage 2 RIGHT ankle (Active)  01/04/17 0730  Location: Ankle  Location Orientation: Right  Staging: Stage II -  Partial thickness loss of dermis presenting as a shallow open ulcer with a red, pink wound bed without slough.  Wound Description (Comments): stage 2 RIGHT ankle  Present on Admission: Yes     Pressure Injury 01/04/17 Stage I -  Intact skin with non-blanchable redness of a localized area usually over a bony prominence. stage 1 RIGHT hip (Active)  01/04/17 0730  Location: Hip  Location Orientation: Right  Staging: Stage I -  Intact skin with non-blanchable redness of a localized area usually over a bony prominence.  Wound Description (Comments): stage 1 RIGHT hip  Present on Admission: Yes     Diet: On tube feeding, n.p.o. by mouth DVT Prophylaxis: subcutaneous Heparin  Advance goals of care discussion: FULL CODE  Family Communication: NO family was present at bedside, at the time of interview.   Disposition:  Discharge to [facility] likely tomorrow..  Consultants: NONE Procedures: NOEN  Scheduled Meds: . amoxicillin-clavulanate  875 mg Per Tube Q12H  . chlorhexidine  15 mL Mouth/Throat BID  . diazepam  2 mg Per Tube Daily  . diazepam  5 mg Oral QHS  . enoxaparin (LOVENOX) injection  40 mg Subcutaneous Q24H  . feeding supplement (PRO-STAT SUGAR FREE 64)  60 mL Per Tube BID  . free water  200 mL Per Tube Q8H  . lacosamide  100 mg Per Tube BID  . levETIRAcetam  1,000 mg Per Tube BID  . loperamide HCl  2 mg Oral Daily  . midodrine  5 mg Per Tube TID WC  . saccharomyces boulardii  250 mg Per Tube BID  . sodium chloride flush  3 mL Intravenous Q12H  . traMADol  50 mg Per Tube BID   Continuous Infusions: . feeding supplement (OSMOLITE 1.5 CAL) 1,000 mL (09/12/18 0830)    PRN Meds: acetaminophen **OR** acetaminophen, glycopyrrolate, ipratropium-albuterol, loperamide HCl, metoprolol tartrate, ondansetron **OR** ondansetron (ZOFRAN) IV Antibiotics: Anti-infectives (From admission, onward)   Start     Dose/Rate Route Frequency Ordered Stop   09/14/18 0000  amoxicillin-clavulanate (AUGMENTIN) 400-57 MG/5ML suspension     875 mg Per Tube 2 times daily 09/14/18 1106 09/18/18 2359   09/12/18 1000  amoxicillin-clavulanate (AUGMENTIN) 400-57 MG/5ML suspension 875 mg     875 mg Per Tube Every 12 hours 09/12/18 0820     09/09/18 2200  piperacillin-tazobactam (ZOSYN) IVPB 3.375 g  Status:  Discontinued     3.375 g 12.5 mL/hr over 240 Minutes Intravenous Every 8 hours 09/09/18 2154 09/12/18 0819   09/09/18 2115  aztreonam (AZACTAM) 2 g in sodium chloride 0.9 % 100 mL IVPB  Status:  Discontinued     2 g 200 mL/hr over 30 Minutes Intravenous  Once 09/09/18 2113 09/09/18 2149       Objective: Physical Exam: Vitals:   09/14/18 1200 09/14/18 1225 09/14/18 1514 09/14/18 1639  BP:    (!) 112/100  Pulse: 99 100 (!) 104 (!) 117  Resp: Marland Kitchen)  24 18 20  (!) 22  Temp: 99 F (37.2 C)   98 F (36.7 C)  TempSrc: Oral   Axillary  SpO2: 97% 100% 100% 95%  Weight:      Height:        Intake/Output Summary (Last 24 hours) at 09/14/2018 1759 Last data filed at 09/14/2018 1600 Gross per 24 hour  Intake 3903 ml  Output 1100 ml  Net 2803 ml   Filed Weights   09/10/18 1507 09/11/18 0535  Weight: 62.1 kg 64.3 kg   General: Alert, Awake. Appear in mild distress, affect appropriate Eyes: PERRL, Conjunctiva normal ENT: Oral Mucosa clear moist. Neck: NO JVD, NO Abnormal Mass Or lumps Cardiovascular: S1 and S2 Present, NO Murmur, Peripheral Pulses Present Respiratory: normal respiratory effort, Bilateral Air entry equal and Decreased, NO use of accessory muscle, BASAL Crackles, NO wheezes Abdomen: Bowel Sound PREsent, Soft and NO tenderness, NO hernia Skin: NO redness, NO Rash,  NO induration Extremities: NO Pedal edema,  Neurologic: Chronic quadriplegia.  Data Reviewed: CBC: Recent Labs  Lab 09/09/18 1737 09/10/18 0306 09/14/18 0339  WBC 10.6* 9.3 7.9  NEUTROABS 7.4  --  4.5  HGB 12.6* 12.2* 11.8*  HCT 40.4 38.8* 37.7*  MCV 97.8 98.2 94.7  PLT 236 233 265   Basic Metabolic Panel: Recent Labs  Lab 09/09/18 1737 09/10/18 0306 09/14/18 0339  NA 138 141 139  K 5.3* 4.6 4.2  CL 105 106 105  CO2 27 28 27   GLUCOSE 107* 102* 107*  BUN 36* 32* 21*  CREATININE 1.03 0.88 0.66  CALCIUM 8.8* 8.6* 8.8*  MG  --   --  1.6*    Liver Function Tests: Recent Labs  Lab 09/09/18 1737 09/14/18 0339  AST 34 21  ALT 24 19  ALKPHOS 62 51  BILITOT 0.4 0.2*  PROT 7.0 6.4*  ALBUMIN 2.8* 2.5*   No results for input(s): LIPASE, AMYLASE in the last 168 hours. No results for input(s): AMMONIA in the last 168 hours. Coagulation Profile: No results for input(s): INR, PROTIME in the last 168 hours. Cardiac Enzymes: No results for input(s): CKTOTAL, CKMB, CKMBINDEX, TROPONINI in the last 168 hours. BNP (last 3 results) No results for input(s): PROBNP in the last 8760 hours. CBG: No results for input(s): GLUCAP in the last 168 hours. Studies: Dg Chest Port 1 View  Result Date: 09/13/2018 CLINICAL DATA:  Hypoxia; history atrial fibrillation, CHF, type II diabetes mellitus, quadriplegia EXAM: PORTABLE CHEST 1 VIEW COMPARISON:  Portable exam 2232 hours compared to 09/09/2018 FINDINGS: Tracheostomy tube projects over the tracheal air column. Partially calcified VP shunt tubing traverses cervical region and RIGHT chest. Enlargement of cardiac silhouette. RIGHT pleural effusion with BILATERAL lower lobe opacities which could represent atelectasis or consolidation, increased since previous exam. Upper lungs clear. No pleural effusion or pneumothorax. Multiple small radiopacities are again identified at the RIGHT mid chest projecting over the upper lobe question parenchymal  calcifications versus aspirated contrast material. IMPRESSION: Enlargement of cardiac silhouette. RIGHT pleural effusion with increased BILATERAL lower lobe opacification question atelectasis versus pneumonia. Electronically Signed   By: Ulyses SouthwardMark  Boles M.D.   On: 09/13/2018 23:06     Time spent: 35 minutes  Author: Lynden OxfordPranav Elzora Cullins, MD [facility] Pager: [phone] 09/14/2018 5:59 PM  Between 7PM-7AM, please contact night-coverage at [email or web address], password [email or web address]

## 2018-09-15 ENCOUNTER — Encounter (HOSPITAL_COMMUNITY): Payer: Self-pay

## 2018-09-15 LAB — CBC
HCT: 34.7 % — ABNORMAL LOW (ref 39.0–52.0)
HEMOGLOBIN: 11 g/dL — AB (ref 13.0–17.0)
MCH: 29.8 pg (ref 26.0–34.0)
MCHC: 31.7 g/dL (ref 30.0–36.0)
MCV: 94 fL (ref 80.0–100.0)
Platelets: 272 10*3/uL (ref 150–400)
RBC: 3.69 MIL/uL — ABNORMAL LOW (ref 4.22–5.81)
RDW: 13.3 % (ref 11.5–15.5)
WBC: 6.6 10*3/uL (ref 4.0–10.5)
nRBC: 0 % (ref 0.0–0.2)

## 2018-09-15 LAB — BASIC METABOLIC PANEL
Anion gap: 7 (ref 5–15)
BUN: 24 mg/dL — ABNORMAL HIGH (ref 6–20)
CHLORIDE: 104 mmol/L (ref 98–111)
CO2: 26 mmol/L (ref 22–32)
Calcium: 9 mg/dL (ref 8.9–10.3)
Creatinine, Ser: 0.61 mg/dL (ref 0.61–1.24)
GFR calc Af Amer: >60 mL/min (ref >60)
GFR calc non Af Amer: >60 mL/min (ref >60)
GLUCOSE: 99 mg/dL (ref 70–99)
Potassium: 4.6 mmol/L (ref 3.5–5.1)
Sodium: 137 mmol/L (ref 135–145)

## 2018-09-15 LAB — MAGNESIUM: Magnesium: 1.6 mg/dL — ABNORMAL LOW (ref 1.7–2.4)

## 2018-09-15 MED ORDER — FREE WATER: 200.0000 mL | Freq: Four times a day (QID) | Status: DC

## 2018-09-15 MED ORDER — MAGNESIUM SULFATE 2 GM/50ML IV SOLN
2.0000 g | Freq: Once | INTRAVENOUS | Status: AC
  Administered 2018-09-15: 2 g via INTRAVENOUS
  Filled 2018-09-15 (×2): qty 50

## 2018-09-15 NOTE — Care Management Important Message (Signed)
Important Message  Patient Details  Name: Dean Graham MRN: [id number] Date of Birth: March 05, 1961   Medicare Important Message Given:  Lucilla Lame to mother, Lorrie Vance via phone # [phone]. Isidoro Donning RN witnessed by Philemon Kingdom RN)    Elliot Cousin, RN 09/15/2018, 12:31 PM

## 2018-09-15 NOTE — Discharge Summary (Signed)
[facility] Discharge Summary   Patient: Dean Graham ZOX:[id number]   PCP: Hillary Bow, MD DOB: 1960/10/25   Date of admission: 09/09/2018   Date of discharge:  09/15/2018    Discharge Diagnoses:   Principal Problem:   Sepsis (HCC) Active Problems:   Quadriplegia (HCC)   Chronic respiratory failure (HCC)   AKI (acute kidney injury) (HCC)   Hyperkalemia   Admitted From: SNF Disposition:  SNF  Recommendations for Outpatient Follow-up:  1. Please follow-up with PCP in 1 week.recommend PCP at SNF to consider slowly down titrating his midodrine and metoprolol which are counter productive. 2. Patient is receiving a liter of free water along with tube feeding on a daily basis.  Patient has significant secretion problem.  Recommend to adjust free water based on the needs.  Follow-up Information    Hillary Bow, MD. Schedule an appointment as soon as possible for a visit in 1 week(s).   Specialty:  Internal Medicine Contact information: [address] [place] [address] [phone]          Diet recommendation: On tube feeding n.p.o. by mouth  Activity: The patient is advised to gradually reintroduce usual activities.  Discharge Condition: good  Code Status: Full code  History of present illness: As per the H and P dictated on admission, "Dean Graham is a 58 y.o. male with medical history significant of traumatic brain injury with quadriplegia, tracheostomy/PEG dependent, diastolic CHF, chronic indwelling Foley catheter, frequent UTIs, aspiration pneumonia, and seizure disorder; who presents with from [facility] for fevers.  At baseline patient is aphasic and history is obtained from review of records.  Prior to arrival patient noted to have fevers up to 102 F but heart rate treated with Tylenol and metoprolol.  Patient reportedly has been having yellow secretions from trach.  ED Course: Patient into the emergency department patient was noted to be  afebrile, pulse 85-100, respiration 12-27, blood pressure 87/64- 114/80, and saturations 96-100%.  Labs revealed WBC 10.6, hemoglobin 12.6, potassium 5.3, BUN 36, creatinine 1.03, lactic acid 1.03, and repeat 1.81.  Chest x-ray showing bibasilar atelectasis right greater than left.  Urinalysis was positive for large leukocytes many bacteria and greater than 50 WBCs.  Of sepsis protocol has been initiated.  Patient was given 3 L of normal saline IV fluids and started on empiric antibiotics of Zosyn.  [facility] called to admit"  Hospital Course:  Summary of his active problems in the hospital is as following. Sepsis2/2suspected aspiration pneumonitis versus pneumonia, suspected urinary tract infection:  Patient presents with reports of fever prior to arrival up to 102 F, tachycardia, WBC 10.6,and lactic acid reassuring. Urinalysis for signs of infection. 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 Urine culture is negative, blood cultures so far negative as well. Sputum culture no growth as well. Mildly elevated procalcitonin levels. Treated with IV Zosyn initially. Since cultures are remaining negative.  We will transition to oral Augmentin.  Acute kidney injury:  Patient presents with elevated creatinine of 1.03 and BUN 36.  At baseline patient creatinine noted to be around 0.6-0.7. suspect prerenal cause of symptoms. Resolved with IV fluids.  renal function back to baseline.  Chronic hypotension: Sinus tachycardia Patient initially presented with soft blood pressure Patient is on midodrine to increase the blood pressure which can cause sinus tachycardia and metoprolol to reduce sinus tachycardia. For now I am continuing this medication on discharge but recommend PCP at SNF to consider slowly down titrating his medicines which are counter  productive.  Hyperkalemia:  Initial potassium noted to be 5.3 on admission. Now normal.  Monitor.  Chronic diastolic CHF.   Patient appears to be clinically dry at this time.Last echocardiogram noted EF 65 to 70% in 05/2016. - Strictintake and output - Daily weights - Discontinue IV fluids. - Reducing free water to 200 cc from 350 cc every 6 hours.  History of TBI with quadriplegia, chronic respiratory failure trach dependent -Respiratory therapy consult -Continuous pulse oximetry with oxygen as needed  Dysphagia/PEG tube -Continue home feeding regimen  History of seizure disorder -Continue Keppra andLacosamide  GERD -Continue Pepcid  Antibiotic induced diarrhea. Imodium  as needed. Monitor.  Body mass index is 22.2 kg/m.   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 (Active)  04/11/17 1100  Location: Hip  Location Orientation: Left  Staging: Stage IV - Full thickness tissue loss with exposed bone, tendon or muscle.  Wound Description (Comments):   Present on Admission: Yes     Pressure Injury Stage III -  Full thickness tissue loss. Subcutaneous fat may be visible but bone, tendon or muscle are NOT exposed. (Active)     Location: Hip  Location Orientation: Right  Staging: Stage III -  Full thickness tissue loss. Subcutaneous fat may be visible but bone, tendon or muscle are NOT exposed.  Wound Description (Comments):   Present on Admission: Yes     Pressure Injury 05/30/16 Stage II -  Partial thickness loss of dermis presenting as a shallow open ulcer with a red, pink wound bed without slough. bilat heels (Active)  05/30/16   Location: Heel  Location Orientation: Left  Staging: Stage II -  Partial thickness loss of dermis presenting as a shallow open ulcer with a red, pink wound bed without slough.  Wound Description (Comments): bilat heels  Present on Admission: Yes       Pressure Injury 01/04/17 Stage IV - Full thickness tissue loss with exposed bone, tendon or muscle. stage 4 LEFT hip (Active)  01/04/17 0730  Location: Hip  Location Orientation: Left  Staging: Stage IV - Full thickness tissue loss with exposed bone, tendon or muscle.  Wound Description (Comments): stage 4 LEFT hip  Present on Admission: Yes     Pressure Injury 01/04/17 Stage II -  Partial thickness loss of dermis presenting as a shallow open ulcer with a red, pink wound bed without slough. stage 2 RIGHT ankle (Active)  01/04/17 0730  Location: Ankle  Location Orientation: Right  Staging: Stage II -  Partial thickness loss of dermis presenting as a shallow open ulcer with a red, pink wound bed without slough.  Wound Description (Comments): stage 2 RIGHT ankle  Present on Admission: Yes     Pressure Injury 01/04/17 Stage I -  Intact skin with non-blanchable redness of a localized area usually over a bony prominence. stage 1 RIGHT hip (Active)  01/04/17 0730  Location: Hip  Location Orientation: Right  Staging: Stage I -  Intact skin with non-blanchable redness of a localized area usually over a bony prominence.  Wound Description (Comments): stage 1 RIGHT hip  Present on Admission: Yes    Patient is a long-term resident at SNF, will transfer back to SNF. On the day of the discharge the patient's vitals are stable, and no other acute medical condition were reported by patient. the patient was  felt safe to be discharge at SNF with therapy.  Consultants: None Procedures: None  DISCHARGE MEDICATION: Allergies as of 09/15/2018      Reactions   Ativan [lorazepam] Other (See Comments)   unknown   Azithromycin Other (See Comments)   Blisters all over body   Ceftriaxone Other (See Comments)   Blisters all over body   Vancomycin Other (See Comments)   Blisters all over body      Medication List    TAKE these medications   acetaminophen 325 MG tablet Commonly known as:   TYLENOL Place 2 tablets (650 mg total) into feeding tube every 6 (six) hours as needed for mild pain (or Fever >/= 101).   amoxicillin-clavulanate 400-57 MG/5ML suspension Commonly known as:  AUGMENTIN Place 10.9 mLs (875 mg total) into feeding tube 2 (two) times daily for 4 days.   chlorhexidine 0.12 % solution Commonly known as:  PERIDEX Use as directed 15 mLs in the mouth or throat 2 (two) times daily. What changed:  additional instructions   diazepam 2 MG tablet Commonly known as:  VALIUM Place 1 tablet (2 mg total) into feeding tube daily.   diazepam 5 MG tablet Commonly known as:  VALIUM Take 1 tablet (5 mg total) by mouth at bedtime.   enoxaparin 40 MG/0.4ML injection Commonly known as:  LOVENOX Inject 0.4 mLs (40 mg total) into the skin at bedtime.   feeding supplement (OSMOLITE 1.5 CAL) Liqd Place 1,000 mLs into feeding tube continuous. What changed:    how much to take  when to take this  additional instructions   feeding supplement (PRO-STAT SUGAR FREE 64) Liqd Place 60 mLs into feeding tube daily. What changed:  when to take this   free water Soln Place 200 mLs into feeding tube every 6 (six) hours. What changed:  how much to take   glycopyrrolate 1 MG tablet Commonly known as:  ROBINUL Place 1 tablet (1 mg total) into feeding tube 2 (two) times daily as needed (EXCESSIVE SECRETION).   ipratropium-albuterol 0.5-2.5 (3) MG/3ML Soln Commonly known as:  DUONEB Take 3 mLs by nebulization every 4 (four) hours as needed.   Lacosamide 100 MG Tabs Place 1 tablet (100 mg total) into feeding tube 2 (two) times daily. What changed:  additional instructions   levETIRAcetam 100 MG/ML solution Commonly known as:  KEPPRA Place 10 mLs (1,000 mg total) into feeding tube 2 (two) times daily. What changed:  additional instructions   loperamide HCl 1 MG/7.5ML suspension Commonly known as:  IMODIUM Take 15 mLs (2 mg total) by mouth 3 (three) times daily as needed  for up to 6 days for diarrhea or loose stools.   metoprolol tartrate 25 MG tablet Commonly known as:  LOPRESSOR Take 25 mg by mouth 2 (two) times daily.   midodrine 10 MG tablet Commonly known as:  PROAMATINE Place 1 tablet (10 mg total) into feeding tube 3 (three) times daily with meals.   multivitamin with minerals Tabs tablet Place 1 tablet into feeding tube daily.   ondansetron 4 MG tablet Commonly known as:  ZOFRAN Place 1 tablet (4 mg total) into feeding tube every 6 (six) hours as needed for nausea.   ranitidine 150 MG tablet Commonly known as:  ZANTAC Place 150 mg into feeding tube 2 (two) times daily.   saccharomyces boulardii 250 MG capsule Commonly known as:  FLORASTOR Place 1 capsule (250 mg total) into feeding tube 2 (two) times daily.   traMADol 50 MG tablet  Commonly known as:  ULTRAM Place 1 tablet (50 mg total) into feeding tube 2 (two) times daily.      Allergies  Allergen Reactions  . Ativan [Lorazepam] Other (See Comments)    unknown  . Azithromycin Other (See Comments)    Blisters all over body  . Ceftriaxone Other (See Comments)    Blisters all over body  . Vancomycin Other (See Comments)    Blisters all over body   Discharge Instructions    Diet NPO time specified   Complete by:  As directed    Discharge instructions   Complete by:  As directed    It is important that you read the given instructions as well as go over your medication list with RN to help you understand your care after this hospitalization.  Discharge Instructions: Please follow-up with PCP in 1-2 weeks  Please request your primary care physician to go over all Hospital Tests and Procedure/Radiological results at the follow up. Please get all Hospital records sent to your PCP by signing hospital release before you go home.   Do not take more than prescribed Pain, Sleep and Anxiety Medications. You were cared for by a hospitalist during your hospital stay. If you have any  questions about your discharge medications or the care you received while you were in the hospital after you are discharged, you can call the unit @UNIT @ you were admitted to and ask to speak with the hospitalist on call if the hospitalist that took care of you is not available.  Once you are discharged, your primary care physician will handle any further medical issues. Please note that NO REFILLS for any discharge medications will be authorized once you are discharged, as it is imperative that you return to your primary care physician (or establish a relationship with a primary care physician if you do not have one) for your aftercare needs so that they can reassess your need for medications and monitor your lab values. You Must read complete instructions/literature along with all the possible adverse reactions/side effects for all the Medicines you take and that have been prescribed to you. Take any new Medicines after you have completely understood and accept all the possible adverse reactions/side effects.   Increase activity slowly   Complete by:  As directed      Discharge Exam: Filed Weights   09/10/18 1507 09/11/18 0535  Weight: 62.1 kg 64.3 kg   Vitals:   09/15/18 0237 09/15/18 0311  BP: (!) 115/91   Pulse: (!) 109   Resp: (!) 22   Temp: 99 F (37.2 C)   SpO2: 97% 97%   General: Appear in NO distress, NO Rash; Oral Mucosa moist. Cardiovascular: S1 and S2 Present, NO Murmur, NO JVD Respiratory: Bilateral Air entry present and UPPER AIRWAY Crackles, NO wheezes Abdomen: Bowel Sound PREsent, Soft and NO tenderness Extremities: NO Pedal edema, NO calf tenderness Neurology: Grossly no focal neuro deficit.  Chronic paraplegia, chronically nonverbal.  The results of significant diagnostics from this hospitalization (including imaging, microbiology, ancillary and laboratory) are listed below for reference.    Significant Diagnostic Studies: Dg Chest Port 1 View  Result Date:  09/13/2018 CLINICAL DATA:  Hypoxia; history atrial fibrillation, CHF, type II diabetes mellitus, quadriplegia EXAM: PORTABLE CHEST 1 VIEW COMPARISON:  Portable exam 2232 hours compared to 09/09/2018 FINDINGS: Tracheostomy tube projects over the tracheal air column. Partially calcified VP shunt tubing traverses cervical region and RIGHT chest. Enlargement of cardiac silhouette. RIGHT pleural effusion with  BILATERAL lower lobe opacities which could represent atelectasis or consolidation, increased since previous exam. Upper lungs clear. No pleural effusion or pneumothorax. Multiple small radiopacities are again identified at the RIGHT mid chest projecting over the upper lobe question parenchymal calcifications versus aspirated contrast material. IMPRESSION: Enlargement of cardiac silhouette. RIGHT pleural effusion with increased BILATERAL lower lobe opacification question atelectasis versus pneumonia. Electronically Signed   By: Ulyses SouthwardMark  Boles M.D.   On: 09/13/2018 23:06   Dg Chest Port 1 View  Result Date: 09/09/2018 CLINICAL DATA:  Fever. EXAM: PORTABLE CHEST 1 VIEW COMPARISON:  Chest x-ray dated 08/06/2017 and chest CT dated 08/05/2017 FINDINGS: Tracheostomy tube in place. Heart size and vascularity are normal. There is focal atelectasis at the right lung base medially, slightly more prominent than on the prior chest x-ray. There are chronic small calcifications in the right midzone, stable. Minimal linear atelectasis at the left lung base. No effusions. IMPRESSION: Bibasilar atelectasis, right greater than left. Electronically Signed   By: Francene BoyersJames  Maxwell M.D.   On: 09/09/2018 18:31    Microbiology: Recent Results (from the past 240 hour(s))  Blood Culture (routine x 2)     Status: None   Collection Time: 09/09/18  5:37 PM  Result Value Ref Range Status   Specimen Description BLOOD RIGHT HAND  Final   Special Requests   Final    BOTTLES DRAWN AEROBIC AND ANAEROBIC Blood Culture adequate volume Performed  at [facility], [address] [place], [place] [address]    Culture NO GROWTH 5 DAYS  Final   Report Status 09/14/2018 FINAL  Final  Blood Culture (routine x 2)     Status: None   Collection Time: 09/09/18  5:46 PM  Result Value Ref Range Status   Specimen Description BLOOD RIGHT HAND  Final   Special Requests   Final    BOTTLES DRAWN AEROBIC AND ANAEROBIC Blood Culture results may not be optimal due to an excessive volume of blood received in culture bottles Performed at [facility], [address] [place], [place] [address]    Culture NO GROWTH 5 DAYS  Final   Report Status 09/14/2018 FINAL  Final  Urine Culture     Status: None   Collection Time: 09/09/18 10:23 PM  Result Value Ref Range Status   Specimen Description URINE, RANDOM  Final   Special Requests   Final    NONE Performed at [facility], [address] [place], [place] [address]    Culture   Final    Multiple bacterial morphotypes present, none predominant. Suggest appropriate recollection if clinically indicated.   Report Status 09/11/2018 FINAL  Final  Culture, respiratory (non-expectorated)     Status: None   Collection Time: 09/10/18  6:16 AM  Result Value Ref Range Status   Specimen Description TRACHEAL ASPIRATE  Final   Special Requests NONE  Final   Gram Stain   Final    ABUNDANT WBC PRESENT,BOTH PMN AND MONONUCLEAR MODERATE GRAM POSITIVE RODS FEW GRAM NEGATIVE RODS RARE GRAM POSITIVE COCCI RARE YEAST    Culture   Final    MODERATE Consistent with normal respiratory flora. Performed at [facility], [address] [place], [place] [address]    Report Status 09/12/2018 FINAL  Final     Labs: CBC: Recent Labs  Lab 09/09/18 1737 09/10/18 0306 09/14/18 0339  WBC 10.6* 9.3 7.9  NEUTROABS 7.4  --  4.5  HGB 12.6* 12.2* 11.8*  HCT 40.4 38.8* 37.7*  MCV 97.8 98.2 94.7  PLT 236 233 265   Basic Metabolic Panel: Recent Labs  Lab 09/09/18 1737 09/10/18 0306  09/14/18 0339  NA 138 141 139  K 5.3* 4.6 4.2  CL 105 106 105  CO2 27 28 27   GLUCOSE 107* 102* 107*  BUN 36* 32* 21*  CREATININE 1.03 0.88 0.66  CALCIUM 8.8* 8.6* 8.8*  MG  --   --  1.6*   Liver Function Tests: Recent Labs  Lab 09/09/18 1737 09/14/18 0339  AST 34 21  ALT 24 19  ALKPHOS 62 51  BILITOT 0.4 0.2*  PROT 7.0 6.4*  ALBUMIN 2.8* 2.5*   No results for input(s): LIPASE, AMYLASE in the last 168 hours. No results for input(s): AMMONIA in the last 168 hours. Cardiac Enzymes: No results for input(s): CKTOTAL, CKMB, CKMBINDEX, TROPONINI in the last 168 hours. BNP (last 3 results) No results for input(s): BNP in the last 8760 hours. CBG: No results for input(s): GLUCAP in the last 168 hours. Time spent: 35 minutes  Signed:  Lynden Oxford  [facility]  09/15/2018  , 7:44 AM

## 2018-09-15 NOTE — Clinical Social Work Placement (Signed)
   CLINICAL SOCIAL WORK PLACEMENT  NOTE  Date:  09/15/2018  Patient Details  Name: Dean Graham MRN: [id number] Date of Birth: [birth year]  Clinical Social Work is seeking post-discharge placement for this patient at the Skilled  Nursing Facility level of care (*CSW will initial, date and re-position this form in  chart as items are completed):      Patient/family provided with [facility] Clinical Social Work Department's list of facilities offering this level of care within the geographic area requested by the patient (or if unable, by the patient's family).  Yes   Patient/family informed of their freedom to choose among providers that offer the needed level of care, that participate in Medicare, Medicaid or managed care program needed by the patient, have an available bed and are willing to accept the patient.      Patient/family informed of [facility]'s ownership interest in [facility] and [facility], as well as of the fact that they are under no obligation to receive care at these facilities.  PASRR submitted to EDS on       PASRR number received on       Existing PASRR number confirmed on 09/11/18     FL2 transmitted to all facilities in geographic area requested by pt/family on       FL2 transmitted to all facilities within larger geographic area on       Patient informed that his/her managed care company has contracts with or will negotiate with certain facilities, including the following:            Patient/family informed of bed offers received.  Patient chooses bed at [facility])     Physician recommends and patient chooses bed at      Patient to be transferred to ([facility]) on 09/15/18.  Patient to be transferred to facility by [facility]     Patient family notified on 09/15/18 of transfer.  Name of family member notified:        PHYSICIAN       Additional Comment:    _______________________________________________ Maree Krabbe,  LCSW 09/15/2018, 9:46 AM

## 2018-09-15 NOTE — Progress Notes (Signed)
Pt seen by MD, orders written for d/c back to [facility].  Called report to RN at [facility].  Removed IV and heart monitor.  Transported by [facility] via stretcher.

## 2018-09-15 NOTE — Clinical Social Work Note (Signed)
Clinical Social Worker facilitated patient discharge including contacting patient family and facility to confirm patient discharge plans.  Clinical information faxed to facility and family agreeable with plan.  CSW arranged ambulance transport via [facility] (pt's bed will be ready at 2:00, [facility] set for 2:00)  to [facility].  RN to call [phone]  for report prior to discharge.  Clinical Social Worker will sign off for now as social work intervention is no longer needed. Please consult [place] again if new need arises.  [place], [place] [phone]

## 2018-11-05 ENCOUNTER — Inpatient Hospital Stay (HOSPITAL_COMMUNITY)
Admission: EM | Admit: 2018-11-05 | Discharge: 2018-11-11 | DRG: 870 | Disposition: A | Discharge status: Other Acute Inpatient Hospital | Payer: Medicare Other | Source: Skilled Nursing Facility | Attending: Pulmonary Disease | Admitting: Pulmonary Disease

## 2018-11-05 ENCOUNTER — Other Ambulatory Visit: Payer: Self-pay

## 2018-11-05 ENCOUNTER — Emergency Department (HOSPITAL_COMMUNITY): Payer: Medicare Other

## 2018-11-05 ENCOUNTER — Emergency Department: Payer: Self-pay

## 2018-11-05 DIAGNOSIS — J9621 Acute and chronic respiratory failure with hypoxia: Secondary | ICD-10-CM | POA: Diagnosis not present

## 2018-11-05 DIAGNOSIS — G40501 Epileptic seizures related to external causes, not intractable, with status epilepticus: Secondary | ICD-10-CM | POA: Diagnosis not present

## 2018-11-05 DIAGNOSIS — J189 Pneumonia, unspecified organism: Secondary | ICD-10-CM | POA: Diagnosis not present

## 2018-11-05 DIAGNOSIS — J690 Pneumonitis due to inhalation of food and vomit: Secondary | ICD-10-CM | POA: Diagnosis present

## 2018-11-05 DIAGNOSIS — Z87820 Personal history of traumatic brain injury: Secondary | ICD-10-CM

## 2018-11-05 DIAGNOSIS — Z8619 Personal history of other infectious and parasitic diseases: Secondary | ICD-10-CM

## 2018-11-05 DIAGNOSIS — M2450 Contracture, unspecified joint: Secondary | ICD-10-CM | POA: Diagnosis not present

## 2018-11-05 DIAGNOSIS — I4891 Unspecified atrial fibrillation: Secondary | ICD-10-CM | POA: Diagnosis present

## 2018-11-05 DIAGNOSIS — F419 Anxiety disorder, unspecified: Secondary | ICD-10-CM | POA: Diagnosis present

## 2018-11-05 DIAGNOSIS — Z7982 Long term (current) use of aspirin: Secondary | ICD-10-CM

## 2018-11-05 DIAGNOSIS — Z8669 Personal history of other diseases of the nervous system and sense organs: Secondary | ICD-10-CM | POA: Diagnosis not present

## 2018-11-05 DIAGNOSIS — J9690 Respiratory failure, unspecified, unspecified whether with hypoxia or hypercapnia: Secondary | ICD-10-CM

## 2018-11-05 DIAGNOSIS — N179 Acute kidney failure, unspecified: Secondary | ICD-10-CM | POA: Diagnosis present

## 2018-11-05 DIAGNOSIS — R112 Nausea with vomiting, unspecified: Secondary | ICD-10-CM | POA: Diagnosis not present

## 2018-11-05 DIAGNOSIS — R569 Unspecified convulsions: Secondary | ICD-10-CM | POA: Diagnosis not present

## 2018-11-05 DIAGNOSIS — L8941 Pressure ulcer of contiguous site of back, buttock and hip, stage 1: Secondary | ICD-10-CM | POA: Diagnosis not present

## 2018-11-05 DIAGNOSIS — H5500 Unspecified nystagmus: Secondary | ICD-10-CM | POA: Diagnosis present

## 2018-11-05 DIAGNOSIS — Z930 Tracheostomy status: Secondary | ICD-10-CM

## 2018-11-05 DIAGNOSIS — A4151 Sepsis due to Escherichia coli [E. coli]: Secondary | ICD-10-CM | POA: Diagnosis present

## 2018-11-05 DIAGNOSIS — A4159 Other Gram-negative sepsis: Secondary | ICD-10-CM | POA: Diagnosis not present

## 2018-11-05 DIAGNOSIS — Z87440 Personal history of urinary (tract) infections: Secondary | ICD-10-CM

## 2018-11-05 DIAGNOSIS — Z9911 Dependence on respirator [ventilator] status: Secondary | ICD-10-CM

## 2018-11-05 DIAGNOSIS — K0889 Other specified disorders of teeth and supporting structures: Secondary | ICD-10-CM | POA: Diagnosis present

## 2018-11-05 DIAGNOSIS — I9589 Other hypotension: Secondary | ICD-10-CM | POA: Diagnosis not present

## 2018-11-05 DIAGNOSIS — R532 Functional quadriplegia: Secondary | ICD-10-CM | POA: Diagnosis present

## 2018-11-05 DIAGNOSIS — Z931 Gastrostomy status: Secondary | ICD-10-CM

## 2018-11-05 DIAGNOSIS — G92 Toxic encephalopathy: Secondary | ICD-10-CM | POA: Diagnosis not present

## 2018-11-05 DIAGNOSIS — M6249 Contracture of muscle, multiple sites: Secondary | ICD-10-CM | POA: Diagnosis not present

## 2018-11-05 DIAGNOSIS — I452 Bifascicular block: Secondary | ICD-10-CM | POA: Diagnosis not present

## 2018-11-05 DIAGNOSIS — I110 Hypertensive heart disease with heart failure: Secondary | ICD-10-CM | POA: Diagnosis not present

## 2018-11-05 DIAGNOSIS — Z79891 Long term (current) use of opiate analgesic: Secondary | ICD-10-CM

## 2018-11-05 DIAGNOSIS — A419 Sepsis, unspecified organism: Secondary | ICD-10-CM | POA: Diagnosis not present

## 2018-11-05 DIAGNOSIS — I482 Chronic atrial fibrillation, unspecified: Secondary | ICD-10-CM | POA: Diagnosis not present

## 2018-11-05 DIAGNOSIS — I5032 Chronic diastolic (congestive) heart failure: Secondary | ICD-10-CM | POA: Diagnosis present

## 2018-11-05 DIAGNOSIS — R4701 Aphasia: Secondary | ICD-10-CM | POA: Diagnosis present

## 2018-11-05 DIAGNOSIS — Z960 Presence of urogenital implants: Secondary | ICD-10-CM | POA: Diagnosis present

## 2018-11-05 DIAGNOSIS — R1310 Dysphagia, unspecified: Secondary | ICD-10-CM | POA: Diagnosis present

## 2018-11-05 DIAGNOSIS — L89223 Pressure ulcer of left hip, stage 3: Secondary | ICD-10-CM | POA: Diagnosis present

## 2018-11-05 DIAGNOSIS — E871 Hypo-osmolality and hyponatremia: Secondary | ICD-10-CM | POA: Diagnosis not present

## 2018-11-05 DIAGNOSIS — Z8701 Personal history of pneumonia (recurrent): Secondary | ICD-10-CM

## 2018-11-05 DIAGNOSIS — K5900 Constipation, unspecified: Secondary | ICD-10-CM

## 2018-11-05 DIAGNOSIS — L97521 Non-pressure chronic ulcer of other part of left foot limited to breakdown of skin: Secondary | ICD-10-CM | POA: Diagnosis not present

## 2018-11-05 DIAGNOSIS — G40901 Epilepsy, unspecified, not intractable, with status epilepticus: Secondary | ICD-10-CM | POA: Diagnosis not present

## 2018-11-05 DIAGNOSIS — N390 Urinary tract infection, site not specified: Secondary | ICD-10-CM

## 2018-11-05 DIAGNOSIS — Z888 Allergy status to other drugs, medicaments and biological substances status: Secondary | ICD-10-CM

## 2018-11-05 DIAGNOSIS — Z79899 Other long term (current) drug therapy: Secondary | ICD-10-CM

## 2018-11-05 DIAGNOSIS — R319 Hematuria, unspecified: Secondary | ICD-10-CM

## 2018-11-05 DIAGNOSIS — E876 Hypokalemia: Secondary | ICD-10-CM | POA: Diagnosis not present

## 2018-11-05 DIAGNOSIS — J9610 Chronic respiratory failure, unspecified whether with hypoxia or hypercapnia: Secondary | ICD-10-CM | POA: Diagnosis not present

## 2018-11-05 DIAGNOSIS — R64 Cachexia: Secondary | ICD-10-CM | POA: Diagnosis not present

## 2018-11-05 DIAGNOSIS — K219 Gastro-esophageal reflux disease without esophagitis: Secondary | ICD-10-CM | POA: Diagnosis not present

## 2018-11-05 DIAGNOSIS — J9620 Acute and chronic respiratory failure, unspecified whether with hypoxia or hypercapnia: Secondary | ICD-10-CM | POA: Diagnosis not present

## 2018-11-05 DIAGNOSIS — R0902 Hypoxemia: Secondary | ICD-10-CM

## 2018-11-05 DIAGNOSIS — R509 Fever, unspecified: Secondary | ICD-10-CM | POA: Diagnosis present

## 2018-11-05 DIAGNOSIS — Z7901 Long term (current) use of anticoagulants: Secondary | ICD-10-CM

## 2018-11-05 DIAGNOSIS — Y95 Nosocomial condition: Secondary | ICD-10-CM | POA: Diagnosis not present

## 2018-11-05 DIAGNOSIS — E11621 Type 2 diabetes mellitus with foot ulcer: Secondary | ICD-10-CM | POA: Diagnosis not present

## 2018-11-05 DIAGNOSIS — Z881 Allergy status to other antibiotic agents status: Secondary | ICD-10-CM

## 2018-11-05 DIAGNOSIS — J9601 Acute respiratory failure with hypoxia: Secondary | ICD-10-CM | POA: Diagnosis not present

## 2018-11-05 DIAGNOSIS — R5381 Other malaise: Secondary | ICD-10-CM | POA: Diagnosis not present

## 2018-11-05 LAB — URINALYSIS, ROUTINE W REFLEX MICROSCOPIC
Bilirubin Urine: NEGATIVE
Glucose, UA: NEGATIVE mg/dL
Ketones, ur: NEGATIVE mg/dL
Nitrite: NEGATIVE
Protein, ur: 100 mg/dL — AB
RBC / HPF: >50 RBC/hpf — ABNORMAL HIGH (ref 0–5)
Specific Gravity, Urine: 1.023 (ref 1.005–1.030)
WBC, UA: >50 WBC/hpf — ABNORMAL HIGH (ref 0–5)
pH: 7 (ref 5.0–8.0)

## 2018-11-05 LAB — CBC WITH DIFFERENTIAL/PLATELET
Abs Immature Granulocytes: 0.03 10*3/uL (ref 0.00–0.07)
Basophils Absolute: 0 10*3/uL (ref 0.0–0.1)
Basophils Relative: 0 %
Eosinophils Absolute: 0.3 10*3/uL (ref 0.0–0.5)
Eosinophils Relative: 3 %
HCT: 41.6 % (ref 39.0–52.0)
Hemoglobin: 12.3 g/dL — ABNORMAL LOW (ref 13.0–17.0)
Immature Granulocytes: 0 %
Lymphocytes Relative: 18 %
Lymphs Abs: 1.8 10*3/uL (ref 0.7–4.0)
MCH: 30.1 pg (ref 26.0–34.0)
MCHC: 29.6 g/dL — ABNORMAL LOW (ref 30.0–36.0)
MCV: 101.7 fL — ABNORMAL HIGH (ref 80.0–100.0)
Monocytes Absolute: 1 10*3/uL (ref 0.1–1.0)
Monocytes Relative: 10 %
Neutro Abs: 6.6 10*3/uL (ref 1.7–7.7)
Neutrophils Relative %: 69 %
Platelets: 200 10*3/uL (ref 150–400)
RBC: 4.09 MIL/uL — ABNORMAL LOW (ref 4.22–5.81)
RDW: 14 % (ref 11.5–15.5)
WBC: 9.8 10*3/uL (ref 4.0–10.5)
nRBC: 0 % (ref 0.0–0.2)

## 2018-11-05 LAB — COMPREHENSIVE METABOLIC PANEL
ALT: 19 U/L (ref 0–44)
AST: 24 U/L (ref 15–41)
Albumin: 2.5 g/dL — ABNORMAL LOW (ref 3.5–5.0)
Alkaline Phosphatase: 59 U/L (ref 38–126)
Anion gap: 8 (ref 5–15)
BUN: 49 mg/dL — ABNORMAL HIGH (ref 6–20)
CO2: 26 mmol/L (ref 22–32)
Calcium: 8.7 mg/dL — ABNORMAL LOW (ref 8.9–10.3)
Chloride: 111 mmol/L (ref 98–111)
Creatinine, Ser: 1.31 mg/dL — ABNORMAL HIGH (ref 0.61–1.24)
GFR calc Af Amer: >60 mL/min (ref >60)
GFR calc non Af Amer: 59 mL/min — ABNORMAL LOW (ref >60)
Glucose, Bld: 148 mg/dL — ABNORMAL HIGH (ref 70–99)
Potassium: 5 mmol/L (ref 3.5–5.1)
Sodium: 145 mmol/L (ref 135–145)
Total Bilirubin: 0.6 mg/dL (ref 0.3–1.2)
Total Protein: 7.1 g/dL (ref 6.5–8.1)

## 2018-11-05 LAB — INFLUENZA PANEL BY PCR (TYPE A & B)
Influenza A By PCR: NEGATIVE
Influenza B By PCR: NEGATIVE

## 2018-11-05 LAB — LACTIC ACID, PLASMA: Lactic Acid, Venous: 1.4 mmol/L (ref 0.5–1.9)

## 2018-11-05 LAB — GLUCOSE, CAPILLARY: Glucose-Capillary: 92 mg/dL (ref 70–99)

## 2018-11-05 LAB — BRAIN NATRIURETIC PEPTIDE: B Natriuretic Peptide: 115.5 pg/mL — ABNORMAL HIGH (ref 0.0–100.0)

## 2018-11-05 MED ORDER — LACTATED RINGERS IV BOLUS
1000.0000 mL | Freq: Once | INTRAVENOUS | Status: AC
  Administered 2018-11-05: 1000 mL via INTRAVENOUS

## 2018-11-05 MED ORDER — SACCHAROMYCES BOULARDII 250 MG PO CAPS
250.0000 mg | ORAL_CAPSULE | Freq: Two times a day (BID) | ORAL | Status: DC
  Administered 2018-11-05 – 2018-11-11 (×12): 250 mg
  Filled 2018-11-05 (×14): qty 1

## 2018-11-05 MED ORDER — METOPROLOL TARTRATE 25 MG PO TABS
25.0000 mg | ORAL_TABLET | Freq: Two times a day (BID) | ORAL | Status: DC
  Administered 2018-11-05 – 2018-11-10 (×4): 25 mg
  Filled 2018-11-05 (×8): qty 1

## 2018-11-05 MED ORDER — GLYCOPYRROLATE 1 MG PO TABS
1.0000 mg | ORAL_TABLET | Freq: Two times a day (BID) | ORAL | Status: DC | PRN
  Filled 2018-11-05: qty 1

## 2018-11-05 MED ORDER — ASPIRIN 81 MG PO CHEW
81.0000 mg | CHEWABLE_TABLET | Freq: Every day | ORAL | Status: DC
  Administered 2018-11-06 – 2018-11-11 (×6): 81 mg
  Filled 2018-11-05 (×7): qty 1

## 2018-11-05 MED ORDER — ONDANSETRON HCL 4 MG PO TABS: 4.0000 mg | ORAL_TABLET | Freq: Four times a day (QID) | ORAL | Status: DC | PRN

## 2018-11-05 MED ORDER — SODIUM CHLORIDE 0.9 % IV SOLN: 2.0000 g | Freq: Once | INTRAVENOUS | Status: DC

## 2018-11-05 MED ORDER — LOPERAMIDE HCL 1 MG/7.5ML PO SUSP
2.0000 mg | Freq: Three times a day (TID) | ORAL | Status: DC | PRN
  Filled 2018-11-05: qty 15

## 2018-11-05 MED ORDER — SODIUM CHLORIDE 0.9% FLUSH
10.0000 mL | Freq: Two times a day (BID) | INTRAVENOUS | Status: DC
  Administered 2018-11-05 – 2018-11-07 (×3): 10 mL
  Administered 2018-11-07 – 2018-11-09 (×2): 20 mL
  Administered 2018-11-09 – 2018-11-11 (×4): 10 mL

## 2018-11-05 MED ORDER — POLYETHYLENE GLYCOL 3350 17 G PO PACK: 17.0000 g | PACK | Freq: Every day | ORAL | Status: DC | PRN

## 2018-11-05 MED ORDER — LEVETIRACETAM 100 MG/ML PO SOLN
1000.0000 mg | Freq: Two times a day (BID) | ORAL | Status: DC
  Administered 2018-11-05 – 2018-11-07 (×4): 1000 mg
  Filled 2018-11-05 (×6): qty 10

## 2018-11-05 MED ORDER — ACETAMINOPHEN 650 MG RE SUPP
650.0000 mg | Freq: Once | RECTAL | Status: AC
  Administered 2018-11-05: 650 mg via RECTAL
  Filled 2018-11-05: qty 1

## 2018-11-05 MED ORDER — ACETAMINOPHEN 650 MG RE SUPP
650.0000 mg | Freq: Four times a day (QID) | RECTAL | Status: DC | PRN
  Administered 2018-11-07: 650 mg via RECTAL
  Filled 2018-11-05: qty 1

## 2018-11-05 MED ORDER — DIAZEPAM 2 MG PO TABS
2.0000 mg | ORAL_TABLET | Freq: Every day | ORAL | Status: DC
  Administered 2018-11-06 – 2018-11-11 (×6): 2 mg
  Filled 2018-11-05 (×7): qty 1

## 2018-11-05 MED ORDER — ONDANSETRON HCL 4 MG/2ML IJ SOLN
4.0000 mg | Freq: Four times a day (QID) | INTRAMUSCULAR | Status: DC | PRN
  Administered 2018-11-07: 4 mg via INTRAVENOUS
  Filled 2018-11-05: qty 2

## 2018-11-05 MED ORDER — FAMOTIDINE 20 MG PO TABS
20.0000 mg | ORAL_TABLET | Freq: Two times a day (BID) | ORAL | Status: DC
  Administered 2018-11-05 – 2018-11-11 (×12): 20 mg
  Filled 2018-11-05 (×13): qty 1

## 2018-11-05 MED ORDER — SODIUM CHLORIDE 0.9 % IV SOLN
INTRAVENOUS | Status: DC
  Administered 2018-11-05 – 2018-11-09 (×5): via INTRAVENOUS

## 2018-11-05 MED ORDER — ACETAMINOPHEN 325 MG PO TABS: 650.0000 mg | ORAL_TABLET | Freq: Four times a day (QID) | ORAL | Status: DC | PRN

## 2018-11-05 MED ORDER — ENOXAPARIN SODIUM 40 MG/0.4ML ~~LOC~~ SOLN
40.0000 mg | SUBCUTANEOUS | Status: DC
  Administered 2018-11-05 – 2018-11-10 (×6): 40 mg via SUBCUTANEOUS
  Filled 2018-11-05 (×7): qty 0.4

## 2018-11-05 MED ORDER — DIAZEPAM 5 MG PO TABS
5.0000 mg | ORAL_TABLET | Freq: Every day | ORAL | Status: DC
  Administered 2018-11-05 – 2018-11-10 (×6): 5 mg via ORAL
  Filled 2018-11-05 (×6): qty 1

## 2018-11-05 MED ORDER — PIPERACILLIN-TAZOBACTAM 3.375 G IVPB 30 MIN
3.3750 g | Freq: Once | INTRAVENOUS | Status: AC
  Administered 2018-11-05: 3.375 g via INTRAVENOUS
  Filled 2018-11-05: qty 50

## 2018-11-05 MED ORDER — SODIUM CHLORIDE 0.9% FLUSH
10.0000 mL | INTRAVENOUS | Status: DC | PRN
  Administered 2018-11-05 – 2018-11-07 (×2): 10 mL
  Filled 2018-11-05: qty 40

## 2018-11-05 MED ORDER — PIPERACILLIN-TAZOBACTAM 3.375 G IVPB
3.3750 g | Freq: Three times a day (TID) | INTRAVENOUS | Status: DC
  Administered 2018-11-05 – 2018-11-07 (×5): 3.375 g via INTRAVENOUS
  Filled 2018-11-05 (×7): qty 50

## 2018-11-05 MED ORDER — LINEZOLID 600 MG/300ML IV SOLN
600.0000 mg | Freq: Once | INTRAVENOUS | Status: AC
  Administered 2018-11-05: 600 mg via INTRAVENOUS
  Filled 2018-11-05: qty 300

## 2018-11-05 NOTE — ED Triage Notes (Signed)
Pt brought in by [facility] and came from [facility] for further evaluation that the family requested ; MD at [facility] was going to have picc line placed for abx and lasix since patient was having a low grade fever and having a chf exacerbation . But family refused to give [facility] consent and decided he needed to come her for further evaluation

## 2018-11-05 NOTE — ED Notes (Signed)
RN informed Pt can receive 2 visitors 

## 2018-11-05 NOTE — Progress Notes (Signed)
Transport collar set up for pt to be transferred from ED to floor. Pt suctioned prior to leaving ED, and ATC set up placed in pt belongings bag and hung on bed for receiving floor.

## 2018-11-05 NOTE — H&P (Signed)
History and Physical    Dean Graham SXQ:[id number] DOB: 03/12/1961 DOA: 11/05/2018  PCP: Hillary Bow, MD  Patient coming from: [facility]   Chief Complaint: Fever  HPI: Dean Graham is a 58 y.o. male with medical history significant of traumatic brain injury with quadriplegia, tracheostomy/PEG dependent, diastolic CHF, chronic indwelling Foley catheter, frequent UTIs, aspiration pneumonia, and seizure disorder; who presents for fever. At baseline patient is aphasic. He was recently admitted to the hospital due to aspiration pneumonitis vs pneumonia, UTI. He was treated with IV zosyn and sent to facility on augmentin.  History gathered from ED physician, patient's mother and brother at bedside.  They state that patient had a fever at [facility], and they requested patient be transferred to [facility] for further treatment.  Patient is able to communicate somewhat by nodding his head yes and shaking his head no.  Apparently he had complained of pain in "his private part" according to the mother. On my exam, patient admits to shortness of breath, cough, but no dysuria. No exposure to family who have traveled internationally.   ED Course: Labs reveal creatinine 1.31, BNP 115.5, WBC 9.8, lactic acid 1.4.  Urinalysis showed negative nitrite, positive leuk esterase, many bacteria, greater than 50 WBC.  Blood cultures and urine culture were obtained and pending.  Chest x-ray revealed bibasilar airspace opacity, most of which is probably chronic but could not exclude superimposed infiltrates. Patient was started on Zosyn and Zyvox.  Review of Systems: As per HPI otherwise 10 point review of systems negative.   Past Medical History:  Diagnosis Date  . Atrial fibrillation (HCC)   . CHF (congestive heart failure) (HCC)   . Chronic respiratory failure (HCC)   . Diabetes mellitus without complication (HCC)   . Diastolic heart failure (HCC)   . Dysphagia   . GERD  (gastroesophageal reflux disease)   . Quadriplegia (HCC)   . Seizures (HCC)     Past Surgical History:  Procedure Laterality Date  . IR GASTROSTOMY TUBE MOD SED  01/11/2017     reports that he has never smoked. He has never used smokeless tobacco. He reports that he does not drink alcohol or use drugs.  Allergies  Allergen Reactions  . Ativan [Lorazepam] Other (See Comments)    unknown  . Azithromycin Other (See Comments)    Blisters all over body  . Ceftriaxone Other (See Comments)    Blisters all over body  . Vancomycin Other (See Comments)    Blisters all over body    No pertinent family history.    Prior to Admission medications   Medication Sig Start Date End Date Taking? Authorizing Provider  acetaminophen (TYLENOL) 325 MG tablet Place 2 tablets (650 mg total) into feeding tube every 6 (six) hours as needed for mild pain (or Fever >/= 101). 04/15/17  Yes Lonia Blood, MD  aspirin 81 MG chewable tablet Place 81 mg into feeding tube daily.   Yes [provider]  chlorhexidine (PERIDEX) 0.12 % solution Use as directed 15 mLs in the mouth or throat 2 (two) times daily. Patient taking differently: Use as directed 5 mLs in the mouth or throat 2 (two) times daily. Every shift 04/15/17  Yes Lonia Blood, MD  diazepam (VALIUM) 2 MG tablet Place 1 tablet (2 mg total) into feeding tube daily. 09/14/18  Yes Rolly Salter, MD  diazepam (VALIUM) 5 MG tablet Take 1 tablet (5 mg total) by mouth at bedtime. 09/14/18  Yes  Rolly Salter, MD  famotidine (PEPCID) 20 MG tablet Place 20 mg into feeding tube 2 (two) times daily.   Yes [provider]  furosemide (LASIX) 40 MG tablet Place 40 mg into feeding tube daily. For 3 days 11/04/18 11/06/18 Yes [provider]  glycopyrrolate (ROBINUL) 1 MG tablet Place 1 tablet (1 mg total) into feeding tube 2 (two) times daily as needed (EXCESSIVE SECRETION). Patient taking differently: Place 1 mg into feeding tube every 12  (twelve) hours.  09/14/18  Yes Rolly Salter, MD  levETIRAcetam (KEPPRA) 100 MG/ML solution Place 10 mLs (1,000 mg total) into feeding tube 2 (two) times daily. Patient taking differently: Place 1,000 mg into feeding tube every 12 (twelve) hours. For seizure 04/15/17  Yes Lonia Blood, MD  Loperamide HCl 1 MG/7.5ML LIQD Take 2 mg by mouth 3 (three) times daily as needed (diarrhea).   Yes [provider]  metoprolol tartrate (LOPRESSOR) 25 MG tablet Place 25 mg into feeding tube every 12 (twelve) hours.    Yes [provider]  Multiple Vitamin (MULTIVITAMIN WITH MINERALS) TABS tablet Place 1 tablet into feeding tube daily. 04/16/17  Yes Lonia Blood, MD  ondansetron (ZOFRAN) 4 MG tablet Place 1 tablet (4 mg total) into feeding tube every 6 (six) hours as needed for nausea. 04/15/17  Yes Lonia Blood, MD  saccharomyces boulardii (FLORASTOR) 250 MG capsule Place 1 capsule (250 mg total) into feeding tube 2 (two) times daily. 04/15/17  Yes Lonia Blood, MD  traMADol (ULTRAM) 50 MG tablet Place 1 tablet (50 mg total) into feeding tube 2 (two) times daily. 09/14/18  Yes Rolly Salter, MD  Amino Acids-Protein Hydrolys (FEEDING SUPPLEMENT, PRO-STAT SUGAR FREE 64,) LIQD Place 60 mLs into feeding tube daily. Patient not taking: Reported on 11/05/2018 04/16/17   Lonia Blood, MD  enoxaparin (LOVENOX) 40 MG/0.4ML injection Inject 0.4 mLs (40 mg total) into the skin at bedtime. Patient not taking: Reported on 11/05/2018 04/15/17   Lonia Blood, MD  ipratropium-albuterol (DUONEB) 0.5-2.5 (3) MG/3ML SOLN Take 3 mLs by nebulization every 4 (four) hours as needed. Patient not taking: Reported on 11/05/2018 09/14/18   Rolly Salter, MD  lacosamide 100 MG TABS Place 1 tablet (100 mg total) into feeding tube 2 (two) times daily. Patient not taking: Reported on 11/05/2018 04/15/17   Lonia Blood, MD  midodrine (PROAMATINE) 10 MG tablet Place 1 tablet (10 mg total) into feeding  tube 3 (three) times daily with meals. Patient not taking: Reported on 11/05/2018 04/15/17   Lonia Blood, MD  Nutritional Supplements (FEEDING SUPPLEMENT, OSMOLITE 1.5 CAL,) LIQD Place 1,000 mLs into feeding tube continuous. Patient not taking: Reported on 11/05/2018 04/15/17   Lonia Blood, MD  Water For Irrigation, Sterile (FREE WATER) SOLN Place 200 mLs into feeding tube every 6 (six) hours. Patient not taking: Reported on 11/05/2018 09/15/18   Rolly Salter, MD    Physical Exam: Vitals:   11/05/18 1448 11/05/18 1500 11/05/18 1509 11/05/18 1645  BP:  104/80  107/86  Pulse: 100 99  100  Resp: (!) 36 (!) 25  (!) 24  Temp:   99.8 F (37.7 C)   TempSrc:   Rectal   SpO2: 98% 97%  100%     Constitutional: NAD, calm, comfortable ENMT: Mucous membranes are dry. Posterior pharynx clear of any exudate or lesions. Poor dentition.  Neck: normal, supple, no masses, no thyromegaly Respiratory: Trach collar, +coarse diffusely  Cardiovascular: Regular rate and rhythm, no murmurs / rubs / gallops. No extremity edema. Abdomen: no tenderness, no masses palpated. No hepatosplenomegaly. Bowel sounds positive.   GU: +condom cath in place  Musculoskeletal: +Contractures of extremities, +muscular atrophy  Neurologic: +Quadriplegia from TBI history, alert, aphasic  Psychiatric: Stable   Labs on Admission: I have personally reviewed following labs and imaging studies  CBC: Recent Labs  Lab 11/05/18 1338  WBC 9.8  NEUTROABS 6.6  HGB 12.3*  HCT 41.6  MCV 101.7*  PLT 200   Basic Metabolic Panel: Recent Labs  Lab 11/05/18 1338  NA 145  K 5.0  CL 111  CO2 26  GLUCOSE 148*  BUN 49*  CREATININE 1.31*  CALCIUM 8.7*   GFR: CrCl cannot be calculated (Unknown ideal weight.). Liver Function Tests: Recent Labs  Lab 11/05/18 1338  AST 24  ALT 19  ALKPHOS 59  BILITOT 0.6  PROT 7.1  ALBUMIN 2.5*   No results for input(s): LIPASE, AMYLASE in the last 168 hours. No results for  input(s): AMMONIA in the last 168 hours. Coagulation Profile: No results for input(s): INR, PROTIME in the last 168 hours. Cardiac Enzymes: No results for input(s): CKTOTAL, CKMB, CKMBINDEX, TROPONINI in the last 168 hours. BNP (last 3 results) No results for input(s): PROBNP in the last 8760 hours. HbA1C: No results for input(s): HGBA1C in the last 72 hours. CBG: No results for input(s): GLUCAP in the last 168 hours. Lipid Profile: No results for input(s): CHOL, HDL, LDLCALC, TRIG, CHOLHDL, LDLDIRECT in the last 72 hours. Thyroid Function Tests: No results for input(s): TSH, T4TOTAL, FREET4, T3FREE, THYROIDAB in the last 72 hours. Anemia Panel: No results for input(s): VITAMINB12, FOLATE, FERRITIN, TIBC, IRON, RETICCTPCT in the last 72 hours. Urine analysis:    Component Value Date/Time   COLORURINE AMBER (A) 11/05/2018 1500   APPEARANCEUR CLOUDY (A) 11/05/2018 1500   LABSPEC 1.023 11/05/2018 1500   PHURINE 7.0 11/05/2018 1500   GLUCOSEU NEGATIVE 11/05/2018 1500   HGBUR MODERATE (A) 11/05/2018 1500   BILIRUBINUR NEGATIVE 11/05/2018 1500   KETONESUR NEGATIVE 11/05/2018 1500   PROTEINUR 100 (A) 11/05/2018 1500   NITRITE NEGATIVE 11/05/2018 1500   LEUKOCYTESUR LARGE (A) 11/05/2018 1500   Sepsis Labs: !!!!!!!!!!!!!!!!!!!!!!!!!!!!!!!!!!!!!!!!!!!! (procalcitonin:4,lacticidven:4) )No results found for this or any previous visit (from the past 240 hour(s)).   Radiological Exams on Admission: Dg Chest Portable 1 View  Result Date: 11/05/2018 CLINICAL DATA:  Fever. EXAM: PORTABLE CHEST 1 VIEW COMPARISON:  09/13/2018 FINDINGS: The tracheostomy tube is in good position with the tip 2.3 cm above the carina. The heart is mildly enlarged but stable. Stable tortuosity and calcification of the thoracic aorta. Bibasilar lung opacity appears slightly more pronounced, particularly on the right side and could not exclude overlying infiltrates. Chronic airspace calcifications noted in the  right lung. No definite pleural effusions. IMPRESSION: 1. Tracheostomy tube in good position. 2. Bibasilar airspace opacity, most of which is probably chronic but could not exclude superimposed infiltrates. Electronically Signed   By: Rudie Meyer M.D.   On: 11/05/2018 13:32   [place] Ekg Site Rite  Result Date: 11/05/2018 If Site Rite image not attached, placement could not be confirmed due to current cardiac rhythm.   EKG: Independently reviewed. NSR, RBBB  Assessment/Plan Active Problems:   Sepsis secondary to UTI (HCC)   Sepsis secondary to UTI, HCAP -Presented with fever 100.6, tachycardia, tachypnea -Has previous history of recurrent UTIs, aspiration pneumonia -Influenza PCR negative -Blood cultures and urine culture are  pending -Continue Zosyn  AKI  -Baseline creatinine 0.6-0.8 -IV fluid  Chronic diastolic heart failure -Hold Lasix in setting of AKI, patient does not appear to be fluid overloaded on exam   Essential hypertension -Continue Lopressor  History of TBI with quadriplegia, chronic respiratory failure trach dependent, dysphagia/PEG dependent -Trach care, PEG care -Dietitian consult for tube feeding orders  Seizure disorder -Continue Keppra, lacosamide  Anxiety -Continue Valium   DVT prophylaxis: Lovenox Code Status: Full, confirmed with mother and brother at bedside Family Communication: Mother and brother at bedside Disposition Plan: Pending improvement, hopefully back to [facility] on discharge  Consults called: None  Admission status: Inpatient   Severity of Illness: The appropriate patient status for this patient is INPATIENT. Inpatient status is judged to be reasonable and necessary in order to provide the required intensity of service to ensure the patient's safety. The patient's presenting symptoms, physical exam findings, and initial radiographic and laboratory data in the context of their chronic comorbidities is felt to place them at high risk for  further clinical deterioration. Furthermore, it is not anticipated that the patient will be medically stable for discharge from the hospital within 2 midnights of admission. The following factors support the patient status of inpatient.   " The patient's presenting symptoms include fever, tachycardia, tachypnea, shortness of breath, cough. " The worrisome physical exam findings include dry mucosa, tachycardia, tachypnea. " The initial radiographic and laboratory data are worrisome because of possible underlying pneumonia as well as urinary tract infection. " The chronic co-morbidities include history of TBI, status post trach and PEG, quadriplegia.   * I certify that at the point of admission it is my clinical judgment that the patient will require inpatient hospital care spanning beyond 2 midnights from the point of admission due to high intensity of service, high risk for further deterioration and high frequency of surveillance required.Noralee Stain, DO [facility] 11/05/2018, 5:57 PM

## 2018-11-05 NOTE — ED Provider Notes (Signed)
[facility] EMERGENCY DEPARTMENT Provider Note   CSN: [id number] Arrival date & time: 11/05/18  1150    History   Chief Complaint Chief Complaint  Patient presents with  . Fever    HPI Dean Graham is a 58 y.o. male.     HPI    58 year old male with a past history of TBI with functional quadriplegia.  He is completely dependent for all his ADLs.  He has chronic hypoxic respiratory failure and is trached.  He has a PEG tube.  He is coming from [facility] for evaluation.  Apparently noted to be febrile and felt that he may have some degree of heart failure as well. I'm not sure what this was based on though as scant records sent with him.  Recommendation for PICC line placement but apparently family wanted a "second opinion" before this was done.  Additional recent history is not readily available.  Past Medical History:  Diagnosis Date  . Atrial fibrillation (HCC)   . CHF (congestive heart failure) (HCC)   . Chronic respiratory failure (HCC)   . Diabetes mellitus without complication (HCC)   . Diastolic heart failure (HCC)   . Dysphagia   . GERD (gastroesophageal reflux disease)   . Quadriplegia (HCC)   . Seizures [facility])     Patient Active Problem List   Diagnosis Date Noted  . Sepsis (HCC) 09/10/2018  . Hyperkalemia 09/10/2018  . Chronic diastolic heart failure (HCC) 04/09/2017  . GERD (gastroesophageal reflux disease) 04/09/2017  . Atrial fibrillation, currently in sinus rhythm 04/09/2017  . Tracheostomy in place [facility]) 04/09/2017  . Chronic respiratory failure with hypoxia (HCC) 04/09/2017  . 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 Diabetes mellitus type 2, diet-controlled (HCC) 04/09/2017  . Chronic hypotension 04/09/2017  . H/O quadriplegia 04/09/2017  . AKI (acute kidney injury) (HCC) 04/09/2017  . Acute hypoxemic respiratory failure (HCC)   . Ventilator dependent (HCC)   . History of infection due to multidrug  resistant Pseudomonas aeruginosa   . Fever   . Leukocytosis   . Aspiration pneumonia (HCC) 01/04/2017  . Anasarca   . Hemoptysis   . Tracheostomy care (HCC)   . Hypoalbuminemia   . Cough with frothy sputum   . VAP (ventilator-associated pneumonia) (HCC)   . Carbapenem-resistant Acinetobacter baumannii infection   . History of infection by MDR Stenotrophomonas maltophilia   . History of MDR Pseudomonas aeruginosa infection   . Chronic post traumatic encephalopathy   . Acute encephalopathy   . Hypokalemia   . Hypomagnesemia   . Urinary tract infection, site not specified   . Acute on chronic respiratory failure with hypoxia (HCC)   . Dysphagia   . Decubitus ulcer of sacral area   . Encounter for wound care   . Protein-calorie malnutrition, severe (HCC)   . Palliative care encounter   . Goals of care, counseling/discussion   . Pressure injury of skin 05/31/2016  . Quadriplegia (HCC) 05/31/2016  . Atrial fibrillation (HCC) 05/31/2016  . Diabetes mellitus without complication (HCC) 05/31/2016  . Chronic respiratory failure (HCC) 05/31/2016  . 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 Septic shock (HCC) 05/29/2016  . HCAP (healthcare-associated pneumonia)     Past Surgical History:  Procedure Laterality Date  . IR GASTROSTOMY TUBE MOD SED  01/11/2017        Home Medications    Prior to Admission medications   Medication Sig Start Date End Date Taking? Authorizing Provider  acetaminophen (TYLENOL)  325 MG tablet Place 2 tablets (650 mg total) into feeding tube every 6 (six) hours as needed for mild pain (or Fever >/= 101). 04/15/17   Lonia Blood, MD  Amino Acids-Protein Hydrolys (FEEDING SUPPLEMENT, PRO-STAT SUGAR FREE 64,) LIQD Place 60 mLs into feeding tube daily. Patient taking differently: Place 60 mLs into feeding tube at bedtime.  04/16/17   Lonia Blood, MD  chlorhexidine (PERIDEX) 0.12 % solution Use as directed 15 mLs in the mouth or throat 2 (two) times  daily. Patient taking differently: Use as directed 15 mLs in the mouth or throat 2 (two) times daily. Every shift 04/15/17   Lonia Blood, MD  diazepam (VALIUM) 2 MG tablet Place 1 tablet (2 mg total) into feeding tube daily. 09/14/18   Rolly Salter, MD  diazepam (VALIUM) 5 MG tablet Take 1 tablet (5 mg total) by mouth at bedtime. 09/14/18   Rolly Salter, MD  enoxaparin (LOVENOX) 40 MG/0.4ML injection Inject 0.4 mLs (40 mg total) into the skin at bedtime. 04/15/17   Lonia Blood, MD  glycopyrrolate (ROBINUL) 1 MG tablet Place 1 tablet (1 mg total) into feeding tube 2 (two) times daily as needed (EXCESSIVE SECRETION). 09/14/18   Rolly Salter, MD  ipratropium-albuterol (DUONEB) 0.5-2.5 (3) MG/3ML SOLN Take 3 mLs by nebulization every 4 (four) hours as needed. 09/14/18   Rolly Salter, MD  lacosamide 100 MG TABS Place 1 tablet (100 mg total) into feeding tube 2 (two) times daily. Patient taking differently: Place 100 mg into feeding tube 2 (two) times daily. For seizure disorder 04/15/17   Lonia Blood, MD  levETIRAcetam (KEPPRA) 100 MG/ML solution Place 10 mLs (1,000 mg total) into feeding tube 2 (two) times daily. Patient taking differently: Place 1,000 mg into feeding tube 2 (two) times daily. For seizure 04/15/17   Lonia Blood, MD  metoprolol tartrate (LOPRESSOR) 25 MG tablet Take 25 mg by mouth 2 (two) times daily.    [provider]  midodrine (PROAMATINE) 10 MG tablet Place 1 tablet (10 mg total) into feeding tube 3 (three) times daily with meals. 04/15/17   Lonia Blood, MD  Multiple Vitamin (MULTIVITAMIN WITH MINERALS) TABS tablet Place 1 tablet into feeding tube daily. 04/16/17   Lonia Blood, MD  Nutritional Supplements (FEEDING SUPPLEMENT, OSMOLITE 1.5 CAL,) LIQD Place 1,000 mLs into feeding tube continuous. Patient taking differently: Place 1,320 mLs into feeding tube See admin instructions. Start time 10am/ 66 ml/hr via pump per G-tube. Run until 0600 or  until total volume of 1320 ml is infused 04/15/17   Lonia Blood, MD  ondansetron (ZOFRAN) 4 MG tablet Place 1 tablet (4 mg total) into feeding tube every 6 (six) hours as needed for nausea. 04/15/17   Lonia Blood, MD  ranitidine (ZANTAC) 150 MG tablet Place 150 mg into feeding tube 2 (two) times daily.    [provider]  saccharomyces boulardii (FLORASTOR) 250 MG capsule Place 1 capsule (250 mg total) into feeding tube 2 (two) times daily. 04/15/17   Lonia Blood, MD  traMADol (ULTRAM) 50 MG tablet Place 1 tablet (50 mg total) into feeding tube 2 (two) times daily. 09/14/18   Rolly Salter, MD  Water For Irrigation, Sterile (FREE WATER) SOLN Place 200 mLs into feeding tube every 6 (six) hours. 09/15/18   Rolly Salter, MD    Family History No family history on file.  Social History Social History   Tobacco  Use  . Smoking status: Never Smoker  . Smokeless tobacco: Never Used  Substance Use Topics  . Alcohol use: No  . Drug use: No     Allergies   Ativan [lorazepam]; Azithromycin; Ceftriaxone; and Vancomycin   Review of Systems Review of Systems  Level 5 caveat because patient is nonverbal. Physical Exam Updated Vital Signs BP 104/75   Pulse (!) 101   Temp (!) 100.6 F (38.1 C) (Rectal)   Resp (!) 25   SpO2 97%   Physical Exam Vitals signs and nursing note reviewed.  Constitutional:      General: He is not in acute distress.    Appearance: He is well-developed.     Comments: Chronically ill appearing  HENT:     Head: Normocephalic and atraumatic.     Nose: Nose normal.  Eyes:     General:        Right eye: No discharge.        Left eye: No discharge.     Conjunctiva/sclera: Conjunctivae normal.     Pupils: Pupils are equal, round, and reactive to light.     Comments: Disconjugate gaze  Cardiovascular:     Rate and Rhythm: Regular rhythm.     Heart sounds: Normal heart sounds. No murmur. No friction rub. No gallop.   Pulmonary:      Comments: Tracheostomy. Some yellow secretions noted. Abdominal:     Palpations: Abdomen is soft.     Comments: PEG tube.   Genitourinary:    Comments: No foley noted.  Musculoskeletal:        General: No tenderness.     Comments: Pressure wounds which appear to have healed to L big toe and R hip.   Skin:    General: Skin is warm.  Neurological:     Comments: contractures  Psychiatric:     Comments: Nonverbal. Will attempt to open eyes when spoken to.       ED Treatments / Results  Labs (all labs ordered are listed, but only abnormal results are displayed) Labs Reviewed  CBC WITH DIFFERENTIAL/PLATELET - Abnormal; Notable for the following components:      Result Value   RBC 4.09 (*)    Hemoglobin 12.3 (*)    MCV 101.7 (*)    MCHC 29.6 (*)    All other components within normal limits  COMPREHENSIVE METABOLIC PANEL - Abnormal; Notable for the following components:   Glucose, Bld 148 (*)    BUN 49 (*)    Creatinine, Ser 1.31 (*)    Calcium 8.7 (*)    Albumin 2.5 (*)    GFR calc non Af Amer 59 (*)    All other components within normal limits  URINALYSIS, ROUTINE W REFLEX MICROSCOPIC - Abnormal; Notable for the following components:   Color, Urine AMBER (*)    APPearance CLOUDY (*)    Hgb urine dipstick MODERATE (*)    Protein, ur 100 (*)    Leukocytes,Ua LARGE (*)    RBC / HPF >50 (*)    WBC, UA >50 (*)    Bacteria, UA MANY (*)    All other components within normal limits  BRAIN NATRIURETIC PEPTIDE - Abnormal; Notable for the following components:   B Natriuretic Peptide 115.5 (*)    All other components within normal limits  CULTURE, BLOOD (ROUTINE X 2)  CULTURE, BLOOD (ROUTINE X 2)  URINE CULTURE  INFLUENZA PANEL BY PCR (TYPE A & B)  LACTIC ACID, PLASMA  EKG EKG Interpretation  Date/Time:  Wednesday November 05 2018 14:53:10 EST Ventricular Rate:  100 PR Interval:    QRS Duration: 91 QT Interval:  364 QTC Calculation: 470 R Axis:   -90 Text  Interpretation:  Sinus tachycardia Probable left atrial enlargement Left anterior fascicular block RSR' in V1 or V2, probably normal variant Nonspecific T abnormalities, lateral leads Baseline wander in lead(s) V5 similar to prior tracing Confirmed by Raeford Razor [phone]) on 11/05/2018 3:30:52 PM   Radiology Dg Chest Portable 1 View  Result Date: 11/05/2018 CLINICAL DATA:  Fever. EXAM: PORTABLE CHEST 1 VIEW COMPARISON:  09/13/2018 FINDINGS: The tracheostomy tube is in good position with the tip 2.3 cm above the carina. The heart is mildly enlarged but stable. Stable tortuosity and calcification of the thoracic aorta. Bibasilar lung opacity appears slightly more pronounced, particularly on the right side and could not exclude overlying infiltrates. Chronic airspace calcifications noted in the right lung. No definite pleural effusions. IMPRESSION: 1. Tracheostomy tube in good position. 2. Bibasilar airspace opacity, most of which is probably chronic but could not exclude superimposed infiltrates. Electronically Signed   By: Rudie Meyer M.D.   On: 11/05/2018 13:32   [place] Ekg Site Rite  Result Date: 11/05/2018 If Site Rite image not attached, placement could not be confirmed due to current cardiac rhythm.   Procedures Procedures (including critical care time)  Medications Ordered in ED Medications  linezolid (ZYVOX) IVPB 600 mg (600 mg Intravenous New Bag/Given 11/05/18 1544)  sodium chloride flush (NS) 0.9 % injection 10-40 mL (10 mLs Intracatheter Given 11/05/18 1501)  sodium chloride flush (NS) 0.9 % injection 10-40 mL (10 mLs Intracatheter Given 11/05/18 1539)  acetaminophen (TYLENOL) suppository 650 mg (650 mg Rectal Given 11/05/18 1304)  piperacillin-tazobactam (ZOSYN) IVPB 3.375 g (0 g Intravenous Stopped 11/05/18 1535)  lactated ringers bolus 1,000 mL (1,000 mLs Intravenous New Bag/Given 11/05/18 1459)     Initial Impression / Assessment and Plan / ED Course  I have reviewed the triage  vital signs and the nursing notes.  Pertinent labs & imaging results that were available during my care of the patient were reviewed by me and considered in my medical decision making (see chart for details).   58 year old male with TBI from [facility] in 1969, functional quadriplegia, chronic respiratory failure status post tracheostomy with fever.  He is in a long-term health facility.  Family wanted another evaluation.  I discussed with his brother. Explained that he is extremely hard to obtain access and PICC is likely outcome. He consents to this.   CXR with possible infiltrate. Hard to interpret. He has presumably had pneumonia/aspiration repeatedly over the years. Not sure of what on CXR is chronic versus a more acute process. He is not hypoxic. Some yellow secretions but little records with him and not sure if increased or baseline.   He does have a UTI which I suspect is the source of his fever. Also AKI. Cr about double baseline. IVF.   Final Clinical Impressions(s) / ED Diagnoses   Final diagnoses:  Urinary tract infection with hematuria, site unspecified  AKI (acute kidney injury) (HCC)  HCAP (healthcare-associated pneumonia)    ED Discharge Orders    None       Raeford Razor, MD 11/12/18 [phone]

## 2018-11-05 NOTE — ED Provider Notes (Signed)
Blood pressure 107/86, pulse 100, temperature 99.8 F (37.7 C), temperature source Rectal, resp. rate (!) 24, SpO2 100 %.  Assuming care from Dr. Juleen China.  In short, Dean Graham is a 58 y.o. male with a chief complaint of Fever .  Refer to the original H&P for additional details.  The current plan of care is to speak with the hospitalist regarding admission.   Discussed patient's case with Hospitalist to request admission. Patient and family (if present) updated with plan. Care transferred to Hospitalist service.  I reviewed all nursing notes, vitals, pertinent old records, EKGs, labs, imaging (as available).    Maia Plan, MD 11/05/18 [phone]

## 2018-11-06 LAB — BLOOD CULTURE ID PANEL (REFLEXED)

## 2018-11-06 LAB — CBC
HCT: 37.2 % — ABNORMAL LOW (ref 39.0–52.0)
Hemoglobin: 11 g/dL — ABNORMAL LOW (ref 13.0–17.0)
MCH: 29.9 pg (ref 26.0–34.0)
MCHC: 29.6 g/dL — ABNORMAL LOW (ref 30.0–36.0)
MCV: 101.1 fL — ABNORMAL HIGH (ref 80.0–100.0)
NRBC: 0 % (ref 0.0–0.2)
Platelets: 192 10*3/uL (ref 150–400)
RBC: 3.68 MIL/uL — ABNORMAL LOW (ref 4.22–5.81)
RDW: 13.8 % (ref 11.5–15.5)
WBC: 9.4 10*3/uL (ref 4.0–10.5)

## 2018-11-06 LAB — BASIC METABOLIC PANEL
Anion gap: 7 (ref 5–15)
BUN: 40 mg/dL — AB (ref 6–20)
CO2: 29 mmol/L (ref 22–32)
Calcium: 8.3 mg/dL — ABNORMAL LOW (ref 8.9–10.3)
Chloride: 109 mmol/L (ref 98–111)
Creatinine, Ser: 1.2 mg/dL (ref 0.61–1.24)
GFR calc Af Amer: >60 mL/min (ref >60)
Glucose, Bld: 107 mg/dL — ABNORMAL HIGH (ref 70–99)
POTASSIUM: 4.5 mmol/L (ref 3.5–5.1)
Sodium: 145 mmol/L (ref 135–145)

## 2018-11-06 LAB — GLUCOSE, CAPILLARY
Glucose-Capillary: 76 mg/dL (ref 70–99)
Glucose-Capillary: 89 mg/dL (ref 70–99)
Glucose-Capillary: 69 mg/dL — ABNORMAL LOW (ref 70–99)
Glucose-Capillary: 121 mg/dL — ABNORMAL HIGH (ref 70–99)

## 2018-11-06 MED ORDER — ORAL CARE MOUTH RINSE
15.0000 mL | Freq: Two times a day (BID) | OROMUCOSAL | Status: DC
  Administered 2018-11-06 – 2018-11-07 (×3): 15 mL via OROMUCOSAL

## 2018-11-06 MED ORDER — PRO-STAT SUGAR FREE PO LIQD
30.0000 mL | Freq: Every day | ORAL | Status: DC
  Administered 2018-11-06 – 2018-11-10 (×5): 30 mL
  Filled 2018-11-06 (×4): qty 30

## 2018-11-06 MED ORDER — CHLORHEXIDINE GLUCONATE 0.12 % MT SOLN
15.0000 mL | Freq: Two times a day (BID) | OROMUCOSAL | Status: DC
  Administered 2018-11-06 – 2018-11-07 (×2): 15 mL via OROMUCOSAL
  Filled 2018-11-06 (×2): qty 15

## 2018-11-06 MED ORDER — FREE WATER
250.0000 mL | Freq: Four times a day (QID) | Status: DC
  Administered 2018-11-06 – 2018-11-07 (×3): 250 mL

## 2018-11-06 MED ORDER — OSMOLITE 1.5 CAL PO LIQD
320.0000 mL | Freq: Four times a day (QID) | ORAL | Status: DC
  Administered 2018-11-06 – 2018-11-07 (×3): 320 mL
  Filled 2018-11-06 (×8): qty 474

## 2018-11-06 NOTE — Consult Note (Addendum)
WOC Nurse wound consult note  Reason for Consult: consult requested for multiple wounds  Wound type: Rolled patient over and assessed skin, there are multiple areas of pink scar tissue from wounds that previously healed including sacrum, L and R hip.  No topical treatment required.  L great toe with dried callus and loose peeling skin which was removed from 50% of the toe, the other 50% is tightly adhered over the nail bed.  No odor, drainage or fluctuance.  No topical treatment.   L outer foot with dry callus, . 5 cms x . 5 cms, no topical treatment necessary.   He is on an air mattress to reduce pressure.  No family present.   Please re-consult if further assistance is needed.  Thank-you,  Cammie Mcgee MSN, RN, CWOCN, [place], CNS [phone]

## 2018-11-06 NOTE — Progress Notes (Signed)
PROGRESS NOTE  Dean Graham MWU:[id number] DOB: 10-18-1960 DOA: 11/05/2018 PCP: Hillary Bow, MD  HPI/Recap of past 24 hours: Dean Graham is a 58 y.o. male with medical history significant of traumatic brain injury with quadriplegia, tracheostomy/PEG dependent, diastolic CHF, chronic indwelling Foley catheter, frequent UTIs, aspiration pneumonia, and seizure disorder; who presents for fever. At baseline patient is aphasic. He was recently admitted to the hospital due to aspiration pneumonitis vs pneumonia, UTI. He was treated with IV zosyn and sent to facility on augmentin.  History gathered from ED physician, patient's mother and brother at bedside.  They state that patient had a fever at [facility], and they requested patient be transferred to [facility] for further treatment.  Patient is able to communicate somewhat by nodding his head yes and shaking his head no.  Apparently he had complained of pain in "his private part" according to the mother. On my exam, patient admits to shortness of breath, cough, but no dysuria. No exposure to family who have traveled internationally.   11/06/2018: Patient seen and examined at his bedside.  He is somnolent and nonverbal.  Does not follow any commands.  Tracheostomy and PEG tube dependent.  Does not appear to be in distress.   Assessment/Plan: Active Problems:   Sepsis secondary to UTI (HCC)  Sepsis secondary to UTI and HCAP Presented with fever of 100.6, tachycardia and tachypnea Independently reviewed chest x-ray done on admission which revealed bibasilar infiltrates versus atelectasis Obtain sputum culture Urine culture in process Blood cultures x2 peripherally) Continue to closely monitor Continue IV antibiotics empirically On IV Zosyn  AKI Baseline creatinine appears to be 0.6 Presented with creatinine of 1.31, trending down to 1.20 Monitor urine output Currently on gentle IV fluid hydration at 75 cc/h Repeat  BMP in the morning  History of traumatic brain injury/quadriplegia Somnolent does not follow command Establish goals of care  Tracheostomy/PEG tube dependence/chronic indwelling Foley Continue trach care Maintain head of bed above 30 degree when feeding  Chronic diastolic heart failure -Hold Lasix in setting of AKI, patient does not appear to be fluid overloaded on exam   Essential hypertension -Continue Lopressor  History of TBI with quadriplegia, chronic respiratory failure trach dependent, dysphagia/PEG dependent -Trach care, PEG care -Dietitian consult for tube feeding orders  Seizure disorder -Continue Keppra, lacosamide  Anxiety -Continue Valium  Diffuse decubitus ulcers Wound care specialist consulted to further assess   DVT prophylaxis: Lovenox Code Status: Full, confirmed with mother and brother at bedside Family Communication:  None at bedside Disposition Plan: Pending improvement, hopefully back to [facility] on discharge  Consults called: None    Objective: Vitals:   11/06/18 0028 11/06/18 0343 11/06/18 0436 11/06/18 0500  BP:   121/81   Pulse: (!) 103 (!) 101 98   Resp: (!) 25 18 18    Temp:    99.8 F (37.7 C)  TempSrc:    Rectal  SpO2: 100% 100% 98%     Intake/Output Summary (Last 24 hours) at 11/06/2018 0937 Last data filed at 11/06/2018 0436 Gross per 24 hour  Intake 1503.75 ml  Output 500 ml  Net 1003.75 ml   There were no vitals filed for this visit.  Exam:  . General: 58 y.o. year-old male chronically ill-appearing.  Somnolent and not following commands.  Diffuse contractures. . Cardiovascular: Regular rate and rhythm with no rubs or gallops.  No thyromegaly or JVD noted.   Marland Kitchen Respiratory: Clear to auscultation with no wheezes or rales. Good inspiratory  effort.  Tracheostomy in place. . Abdomen: Soft nontender nondistended with normal bowel sounds x4 quadrants.  PEG tube in place. Marland Kitchen Psychiatry: Unable to assess mood due to  somnolence.   Data Reviewed: CBC: Recent Labs  Lab 11/05/18 1338 11/06/18 0320  WBC 9.8 9.4  NEUTROABS 6.6  --   HGB 12.3* 11.0*  HCT 41.6 37.2*  MCV 101.7* 101.1*  PLT 200 192   Basic Metabolic Panel: Recent Labs  Lab 11/05/18 1338 11/06/18 0320  NA 145 145  K 5.0 4.5  CL 111 109  CO2 26 29  GLUCOSE 148* 107*  BUN 49* 40*  CREATININE 1.31* 1.20  CALCIUM 8.7* 8.3*   GFR: CrCl cannot be calculated (Unknown ideal weight.). Liver Function Tests: Recent Labs  Lab 11/05/18 1338  AST 24  ALT 19  ALKPHOS 59  BILITOT 0.6  PROT 7.1  ALBUMIN 2.5*   No results for input(s): LIPASE, AMYLASE in the last 168 hours. No results for input(s): AMMONIA in the last 168 hours. Coagulation Profile: No results for input(s): INR, PROTIME in the last 168 hours. Cardiac Enzymes: No results for input(s): CKTOTAL, CKMB, CKMBINDEX, TROPONINI in the last 168 hours. BNP (last 3 results) No results for input(s): PROBNP in the last 8760 hours. HbA1C: No results for input(s): HGBA1C in the last 72 hours. CBG: Recent Labs  Lab 11/05/18 2230 11/06/18 0820  GLUCAP 92 76   Lipid Profile: No results for input(s): CHOL, HDL, LDLCALC, TRIG, CHOLHDL, LDLDIRECT in the last 72 hours. Thyroid Function Tests: No results for input(s): TSH, T4TOTAL, FREET4, T3FREE, THYROIDAB in the last 72 hours. Anemia Panel: No results for input(s): VITAMINB12, FOLATE, FERRITIN, TIBC, IRON, RETICCTPCT in the last 72 hours. Urine analysis:    Component Value Date/Time   COLORURINE AMBER (A) 11/05/2018 1500   APPEARANCEUR CLOUDY (A) 11/05/2018 1500   LABSPEC 1.023 11/05/2018 1500   PHURINE 7.0 11/05/2018 1500   GLUCOSEU NEGATIVE 11/05/2018 1500   HGBUR MODERATE (A) 11/05/2018 1500   BILIRUBINUR NEGATIVE 11/05/2018 1500   KETONESUR NEGATIVE 11/05/2018 1500   PROTEINUR 100 (A) 11/05/2018 1500   NITRITE NEGATIVE 11/05/2018 1500   LEUKOCYTESUR LARGE (A) 11/05/2018 1500   Sepsis  Labs: @LABRCNTIP (procalcitonin:4,lacticidven:4)  )No results found for this or any previous visit (from the past 240 hour(s)).    Studies: Dg Chest Portable 1 View  Result Date: 11/05/2018 CLINICAL DATA:  Fever. EXAM: PORTABLE CHEST 1 VIEW COMPARISON:  09/13/2018 FINDINGS: The tracheostomy tube is in good position with the tip 2.3 cm above the carina. The heart is mildly enlarged but stable. Stable tortuosity and calcification of the thoracic aorta. Bibasilar lung opacity appears slightly more pronounced, particularly on the right side and could not exclude overlying infiltrates. Chronic airspace calcifications noted in the right lung. No definite pleural effusions. IMPRESSION: 1. Tracheostomy tube in good position. 2. Bibasilar airspace opacity, most of which is probably chronic but could not exclude superimposed infiltrates. Electronically Signed   By: Rudie Meyer M.D.   On: 11/05/2018 13:32   [place] Ekg Site Rite  Result Date: 11/05/2018 If Site Rite image not attached, placement could not be confirmed due to current cardiac rhythm.   Scheduled Meds: . aspirin  81 mg Per Tube Daily  . diazepam  2 mg Per Tube Daily  . diazepam  5 mg Oral QHS  . enoxaparin (LOVENOX) injection  40 mg Subcutaneous Q24H  . famotidine  20 mg Per Tube BID  . levETIRAcetam  1,000 mg Per Tube Q12H  .  metoprolol tartrate  25 mg Per Tube Q12H  . saccharomyces boulardii  250 mg Per Tube BID  . sodium chloride flush  10-40 mL Intracatheter Q12H    Continuous Infusions: . sodium chloride 75 mL/hr at 11/05/18 2057  . piperacillin-tazobactam (ZOSYN)  IV 3.375 g (11/06/18 0531)     LOS: 1 day     Darlin Drop, MD [facility] Pager [phone]  If 7PM-7AM, please contact night-coverage [email or web address] Password [facility] 11/06/2018, 9:37 AM

## 2018-11-06 NOTE — Progress Notes (Signed)
At 10am pt BP was 90/68 manual. Patient had been receiving increased volume of IV fluids.  Student nurse crushed and mixed all medications and after speaking with Tobi Bastos the nurse we decided to hold meds at this time. Dawn Kendra Opitz and Herbert Seta Bullins at bedside when meds were wasted by Comptroller. AM dose of Valium was in the meds wasted. Pharmacy is aware.

## 2018-11-06 NOTE — Progress Notes (Signed)
PHARMACY - PHYSICIAN COMMUNICATION CRITICAL VALUE ALERT - BLOOD CULTURE IDENTIFICATION (BCID)  Dean Graham is an 58 y.o. male who presented to [facility] on 11/05/2018 with a chief complaint of fever  Assessment: Transferred from [facility] with new fever. Has a history of frequent UTIs with indwelling chronic foley. Blood cultures are 1/4 with GPC. BCID resulted with coagulase negative staph. Urine culture remains pending   Name of physician (or Provider) Contacted: Dr Margo Aye  Current antibiotics: Zosyn + Zyvox  Changes to prescribed antibiotics recommended:  No change as other cultures pending.   Results for orders placed or performed during the hospital encounter of 11/05/18  Blood Culture ID Panel (Reflexed) (Collected: 11/05/2018  1:59 PM)  Result Value Ref Range   Enterococcus species NOT DETECTED NOT DETECTED   Listeria monocytogenes NOT DETECTED NOT DETECTED   Staphylococcus species DETECTED (A) NOT DETECTED   Staphylococcus aureus (BCID) NOT DETECTED NOT DETECTED   Methicillin resistance DETECTED (A) NOT DETECTED   Streptococcus species NOT DETECTED NOT DETECTED   Streptococcus agalactiae NOT DETECTED NOT DETECTED   Streptococcus pneumoniae NOT DETECTED NOT DETECTED   Streptococcus pyogenes NOT DETECTED NOT DETECTED   Acinetobacter baumannii NOT DETECTED NOT DETECTED   Enterobacteriaceae species NOT DETECTED NOT DETECTED   Enterobacter cloacae complex NOT DETECTED NOT DETECTED   Escherichia coli NOT DETECTED NOT DETECTED   Klebsiella oxytoca NOT DETECTED NOT DETECTED   Klebsiella pneumoniae NOT DETECTED NOT DETECTED   Proteus species NOT DETECTED NOT DETECTED   Serratia marcescens NOT DETECTED NOT DETECTED   Haemophilus influenzae NOT DETECTED NOT DETECTED   Neisseria meningitidis NOT DETECTED NOT DETECTED   Pseudomonas aeruginosa NOT DETECTED NOT DETECTED   Candida albicans NOT DETECTED NOT DETECTED   Candida glabrata NOT DETECTED NOT DETECTED   Candida krusei NOT  DETECTED NOT DETECTED   Candida parapsilosis NOT DETECTED NOT DETECTED   Candida tropicalis NOT DETECTED NOT DETECTED    Fayne Norrie 11/06/2018  1:35 PM

## 2018-11-06 NOTE — Progress Notes (Addendum)
Initial Nutrition Assessment  DOCUMENTATION CODES:   Not applicable  INTERVENTION:    Resume bolus TF with Osmolite 1.5 320 ml QID (8am-12pm-4pm-8pm)  Pro-stat 30 ml once daily  Provides 2020 kcal, 95 gm protein, 975 ml free water daily  Free water flushes 250 ml QID  NUTRITION DIAGNOSIS:   Inadequate oral intake related to inability to eat as evidenced by NPO status.  GOAL:   Patient will meet greater than or equal to 90% of their needs  MONITOR:   TF tolerance, Labs, Skin  REASON FOR ASSESSMENT:   Consult Enteral/tube feeding initiation and management  ASSESSMENT:   58 yo male with PMH of DM, quadriplegia, chronic resp failure, trach, PEG, A fib, HF, seizures who was admitted from [facility] with sepsis related to UTI and HCAP.   Per review of facility MAR, patient received bolus TF via PEG: Isosource 1.5 220 ml every 4 hours with Pro-stat 64 30 ml once daily.   Received MD Consult for TF initiation and management.  Labs reviewed. Medications reviewed and include Florastor BID.   NFPE completed; difficult to assess many areas due to contracted extremities. Muscle depletion expected with quadriplegia.  NUTRITION - FOCUSED PHYSICAL EXAM:    Most Recent Value  Orbital Region  No depletion  Upper Arm Region  No depletion  Thoracic and Lumbar Region  No depletion  Buccal Region  No depletion  Temple Region  Mild depletion  Clavicle Bone Region  Mild depletion  Clavicle and Acromion Bone Region  Mild depletion  Scapular Bone Region  Unable to assess  Dorsal Hand  Unable to assess  Patellar Region  Unable to assess  Anterior Thigh Region  Unable to assess  Posterior Calf Region  Unable to assess  Edema (RD Assessment)  Unable to assess  Hair  Reviewed  Eyes  Unable to assess  Mouth  Unable to assess  Skin  Reviewed  Nails  Reviewed       Diet Order:   Diet Order            Diet NPO time specified  Diet effective now               EDUCATION NEEDS:   No education needs have been identified at this time  Skin:  Skin Assessment: Skin Integrity Issues: Skin Integrity Issues:: Other (Comment) Other: left leg/ankle wounds; pink scar tissue from wounds that previously healed on sacrum, L hip, & R hip   Last BM:  2/26  Height:   Ht Readings from Last 1 Encounters:  11/06/18 5\' 7"  (1.702 m)    Weight:   Wt Readings from Last 1 Encounters:  09/11/18 64.3 kg    Ideal Body Weight:  60.5 kg (adjusted for quadriplegia)  BMI:  24.7 (adjusted for quadriplegia)  Estimated Nutritional Needs:   Kcal:  1700-1900  Protein:  85-100 gm  Fluid:  >/= 1.8 L    Joaquin Courts, RD, LDN, CNSC Pager [phone] After Hours Pager [phone]

## 2018-11-06 NOTE — Progress Notes (Signed)
At 10 am the pt's BP was 90/68 checked manually. I crushed all medications and after speaking with Tobi Bastos the nurse we decided to hold the medications at this time. Dawn Engles and Herbert Seta Bullins were at the bedside the time of the medication being wasted at the sink. AM dose of Valium was in the medications that were wasted. Pharmacy is aware.

## 2018-11-07 ENCOUNTER — Inpatient Hospital Stay (HOSPITAL_COMMUNITY): Payer: Medicare Other

## 2018-11-07 DIAGNOSIS — R5381 Other malaise: Secondary | ICD-10-CM

## 2018-11-07 DIAGNOSIS — M2450 Contracture, unspecified joint: Secondary | ICD-10-CM

## 2018-11-07 DIAGNOSIS — R64 Cachexia: Secondary | ICD-10-CM

## 2018-11-07 DIAGNOSIS — L8941 Pressure ulcer of contiguous site of back, buttock and hip, stage 1: Secondary | ICD-10-CM

## 2018-11-07 DIAGNOSIS — Z8669 Personal history of other diseases of the nervous system and sense organs: Secondary | ICD-10-CM

## 2018-11-07 DIAGNOSIS — G40901 Epilepsy, unspecified, not intractable, with status epilepticus: Secondary | ICD-10-CM

## 2018-11-07 DIAGNOSIS — J9621 Acute and chronic respiratory failure with hypoxia: Secondary | ICD-10-CM

## 2018-11-07 DIAGNOSIS — N390 Urinary tract infection, site not specified: Secondary | ICD-10-CM

## 2018-11-07 DIAGNOSIS — A419 Sepsis, unspecified organism: Secondary | ICD-10-CM

## 2018-11-07 LAB — COMPREHENSIVE METABOLIC PANEL
ALT: 20 U/L (ref 0–44)
AST: 30 U/L (ref 15–41)
Albumin: 2.4 g/dL — ABNORMAL LOW (ref 3.5–5.0)
Alkaline Phosphatase: 51 U/L (ref 38–126)
Anion gap: 8 (ref 5–15)
BUN: 28 mg/dL — ABNORMAL HIGH (ref 6–20)
CO2: 25 mmol/L (ref 22–32)
Calcium: 8 mg/dL — ABNORMAL LOW (ref 8.9–10.3)
Chloride: 114 mmol/L — ABNORMAL HIGH (ref 98–111)
Creatinine, Ser: 0.98 mg/dL (ref 0.61–1.24)
GFR calc non Af Amer: >60 mL/min (ref >60)
Glucose, Bld: 103 mg/dL — ABNORMAL HIGH (ref 70–99)
Potassium: 3.6 mmol/L (ref 3.5–5.1)
Sodium: 147 mmol/L — ABNORMAL HIGH (ref 135–145)
Total Bilirubin: 0.3 mg/dL (ref 0.3–1.2)
Total Protein: 6.3 g/dL — ABNORMAL LOW (ref 6.5–8.1)

## 2018-11-07 LAB — POCT I-STAT 7, (LYTES, BLD GAS, ICA,H+H)
Acid-Base Excess: 2 mmol/L (ref 0.0–2.0)
BICARBONATE: 26.3 mmol/L (ref 20.0–28.0)
Calcium, Ion: 1.19 mmol/L (ref 1.15–1.40)
HCT: 34 % — ABNORMAL LOW (ref 39.0–52.0)
Hemoglobin: 11.6 g/dL — ABNORMAL LOW (ref 13.0–17.0)
O2 Saturation: 100 %
PO2 ART: 235 mmHg — AB (ref 83.0–108.0)
Patient temperature: 102.5
Potassium: 3.5 mmol/L (ref 3.5–5.1)
Sodium: 150 mmol/L — ABNORMAL HIGH (ref 135–145)
TCO2: 28 mmol/L (ref 22–32)
pCO2 arterial: 45.3 mmHg (ref 32.0–48.0)
pH, Arterial: 7.381 (ref 7.350–7.450)

## 2018-11-07 LAB — CBC WITH DIFFERENTIAL/PLATELET
Abs Immature Granulocytes: 0 10*3/uL (ref 0.00–0.07)
BASOS ABS: 0 10*3/uL (ref 0.0–0.1)
Basophils Relative: 0 %
Eosinophils Absolute: 0 10*3/uL (ref 0.0–0.5)
Eosinophils Relative: 0 %
HCT: 33.5 % — ABNORMAL LOW (ref 39.0–52.0)
Hemoglobin: 10.1 g/dL — ABNORMAL LOW (ref 13.0–17.0)
Lymphocytes Relative: 3 %
Lymphs Abs: 0.5 10*3/uL — ABNORMAL LOW (ref 0.7–4.0)
MCH: 30.1 pg (ref 26.0–34.0)
MCHC: 30.1 g/dL (ref 30.0–36.0)
MCV: 99.7 fL (ref 80.0–100.0)
Monocytes Absolute: 0.5 10*3/uL (ref 0.1–1.0)
Monocytes Relative: 3 %
Neutro Abs: 15.7 10*3/uL — ABNORMAL HIGH (ref 1.7–7.7)
Neutrophils Relative %: 94 %
PLATELETS: 206 10*3/uL (ref 150–400)
RBC: 3.36 MIL/uL — ABNORMAL LOW (ref 4.22–5.81)
RDW: 13.7 % (ref 11.5–15.5)
WBC: 16.7 10*3/uL — ABNORMAL HIGH (ref 4.0–10.5)
nRBC: 0 % (ref 0.0–0.2)
nRBC: 0 /100 WBC

## 2018-11-07 LAB — LACTIC ACID, PLASMA: Lactic Acid, Venous: 1.8 mmol/L (ref 0.5–1.9)

## 2018-11-07 LAB — GLUCOSE, CAPILLARY
Glucose-Capillary: 80 mg/dL (ref 70–99)
Glucose-Capillary: 120 mg/dL — ABNORMAL HIGH (ref 70–99)

## 2018-11-07 LAB — HIV ANTIBODY (ROUTINE TESTING W REFLEX): HIV Screen 4th Generation wRfx: NONREACTIVE

## 2018-11-07 MED ORDER — LORAZEPAM 2 MG/ML IJ SOLN
INTRAMUSCULAR | Status: AC
  Administered 2018-11-07: 2 mg
  Filled 2018-11-07: qty 1

## 2018-11-07 MED ORDER — FAMOTIDINE IN NACL 20-0.9 MG/50ML-% IV SOLN: 20.0000 mg | Freq: Two times a day (BID) | INTRAVENOUS | Status: DC

## 2018-11-07 MED ORDER — SODIUM CHLORIDE 0.9 % IV BOLUS
500.0000 mL | Freq: Once | INTRAVENOUS | Status: AC
  Administered 2018-11-07: 500 mL via INTRAVENOUS

## 2018-11-07 MED ORDER — LACTATED RINGERS IV BOLUS
500.0000 mL | Freq: Once | INTRAVENOUS | Status: AC
  Administered 2018-11-07: 500 mL via INTRAVENOUS

## 2018-11-07 MED ORDER — OSMOLITE 1.5 CAL PO LIQD: 320.0000 mL | Freq: Four times a day (QID) | ORAL | Status: DC

## 2018-11-07 MED ORDER — PIPERACILLIN-TAZOBACTAM 3.375 G IVPB
3.3750 g | Freq: Three times a day (TID) | INTRAVENOUS | Status: DC
  Administered 2018-11-07 – 2018-11-11 (×12): 3.375 g via INTRAVENOUS
  Filled 2018-11-07 (×11): qty 50

## 2018-11-07 MED ORDER — ORAL CARE MOUTH RINSE
15.0000 mL | OROMUCOSAL | Status: DC
  Administered 2018-11-07 – 2018-11-11 (×40): 15 mL via OROMUCOSAL

## 2018-11-07 MED ORDER — SODIUM CHLORIDE 0.9 % IV SOLN
INTRAVENOUS | Status: DC | PRN
  Administered 2018-11-07 – 2018-11-09 (×2): 500 mL via INTRAVENOUS

## 2018-11-07 MED ORDER — SODIUM CHLORIDE 0.9 % IV SOLN
100.0000 mg | Freq: Two times a day (BID) | INTRAVENOUS | Status: DC
  Administered 2018-11-07 – 2018-11-11 (×8): 100 mg via INTRAVENOUS
  Filled 2018-11-07 (×11): qty 10

## 2018-11-07 MED ORDER — FREE WATER: 250.0000 mL | Freq: Four times a day (QID) | Status: DC

## 2018-11-07 MED ORDER — CHLORHEXIDINE GLUCONATE 0.12% ORAL RINSE (MEDLINE KIT)
15.0000 mL | Freq: Two times a day (BID) | OROMUCOSAL | Status: DC
  Administered 2018-11-07 – 2018-11-11 (×8): 15 mL via OROMUCOSAL

## 2018-11-07 MED ORDER — LEVETIRACETAM IN NACL 1500 MG/100ML IV SOLN
1500.0000 mg | Freq: Two times a day (BID) | INTRAVENOUS | Status: DC
  Administered 2018-11-07 – 2018-11-11 (×8): 1500 mg via INTRAVENOUS
  Filled 2018-11-07 (×8): qty 100

## 2018-11-07 NOTE — Progress Notes (Addendum)
PROGRESS NOTE  Dean Graham DVV:[id number] DOB: [birth year] DOA: 11/05/2018 PCP: Hillary Bow, MD  HPI/Recap of past 24 hours: Dean Graham is a 58 y.o. male with medical history significant of traumatic brain injury with quadriplegia, tracheostomy/PEG dependent, diastolic CHF, chronic indwelling Foley catheter, frequent UTIs, aspiration pneumonia, and seizure disorder; who presents for fever. At baseline patient is aphasic. He was recently admitted to the hospital due to aspiration pneumonitis vs pneumonia, UTI. He was treated with IV zosyn and sent to facility on augmentin.  History gathered from ED physician, patient's mother and brother at bedside.  They state that patient had a fever at [facility], and they requested patient be transferred to [facility] for further treatment.  Patient is able to communicate somewhat by nodding his head yes and shaking his head no.  Apparently he had complained of pain in "his private part" according to the mother. On my exam, patient admits to shortness of breath, cough, but no dysuria. No exposure to family who have traveled internationally.   11/07/2018: Patient seen and examined this morning no acute distress.  This afternoon RN reports some vomiting, intractable to IV Zofran.  Arrived in the room and patient was still vomiting.  Possible aspiration.  Suctioned and increased O2 supplementation to max. Call to RT to suction and place on ventilation which improved his O2 saturation. Rapid response team arrived and place NG tube due to vomiting. Consulted PCCM for transfer to ICU to continue mechanical ventilation.     Assessment/Plan: Active Problems:   Sepsis secondary to UTI (HCC)  Sepsis secondary to Providencia bacteremia, suspected aspiration PNA, HCAP, and UTI Presented to [facility] from [facility] on 11/05/18 with fever of 100.6, tachycardia and tachypnea Blood cx x 2 drawn on admission grew providencia; on zosyn empirically until  sensitivities result On 11/07/18 episode of vomiting with possible aspiration, continue zosyn Stat CXR independently reviewed revealed bibasilar infiltrates worse on the right. Placed on mechanical ventilation on 11/07/18 and PCCM consulted Maintain O2 sat >92% Transfer to medical ICU Repeat blood cx x 2 today  Intractable nausea with vomiting  No bowel movements in 2 days Low bowel sounds on abd exam  Obtain abd xray to r/o ileus vs obnstruction Place in NG tube IV zofran prn Obtain 12 lead EKG  Possible seizure in the setting of seizure disorder On Keppra and valium via peg tube;  Unclear if he was taking Vimpat prior to hospitalization Started IV keppra 1500 mg BID Received IV ativan per PCCM  AKI Baseline creatinine appears to be 0.6 Presented with creatinine of 1.31, trending down to 1.20 Monitor urine output Currently on gentle IV fluid hydration at 75 cc/h Repeat labs  History of traumatic brain injury/quadriplegia Somnolent does not follow command Establish goals of care  Tracheostomy/PEG tube dependence/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   DVT prophylaxis: SQ Lovenox daily Code Status: Limited Code: Spoke with his mother and brother on the phone on  11/07/18. Code status reversed to doing everything except chest compressions. Family Communication:  None at bedside Disposition Plan:  undetermined, pending clinical improvement Consults called: PCCM    Objective: Vitals:   11/07/18 0413 11/07/18 0431 11/07/18 0733 11/07/18 0802  BP:  105/73  109/80  Pulse: 88 83  91  Resp: 18 20  17   Temp:  98.1 F (36.7 C)  97.7 F (36.5 C)  TempSrc:  Axillary  Oral  SpO2: 99% 98% 99% 100%  Height:        Intake/Output Summary (Last 24 hours) at 11/07/2018 1520 Last data filed at 11/07/2018 0504 Gross per 24 hour  Intake 799.94 ml  Output 900 ml  Net -100.06 ml   There were no vitals filed for this visit.  Exam:  . General: 58 y.o. year-old male chronically ill-appearing in no acute distress this am; this pm, in respiratory distress. . Cardiovascular: Regular rate and rhythm with no rubs or gallops.  No thyromegaly or JVD noted.   Marland Kitchen. Respiratory: Ronchorous sounds with increase in secretions from his trach . Abdomen: Soft nontender nondistended with hypoactive bowel sounds x4 quadrants.  PEG tube in place. Marland Kitchen. Psychiatry: Mood appropriate this am. This pm in acute distress.   Data Reviewed: CBC: Recent Labs  Lab 11/05/18 1338 11/06/18 0320  WBC 9.8 9.4  NEUTROABS 6.6  --   HGB 12.3* 11.0*  HCT 41.6 37.2*  MCV 101.7* 101.1*  PLT 200 192   Basic Metabolic Panel: Recent Labs  Lab 11/05/18 1338 11/06/18 0320  NA 145 145  K 5.0 4.5  CL 111 109  CO2 26 29  GLUCOSE 148* 107*  BUN 49* 40*  CREATININE 1.31* 1.20  CALCIUM 8.7* 8.3*   GFR: CrCl cannot be calculated (Unknown ideal weight.). Liver Function Tests: Recent Labs  Lab 11/05/18 1338  AST 24  ALT 19  ALKPHOS 59  BILITOT 0.6  PROT 7.1  ALBUMIN 2.5*   No results for input(s): LIPASE, AMYLASE in the last 168 hours. No results for input(s): AMMONIA in the last 168 hours. Coagulation Profile: No results for input(s): INR, PROTIME in the last 168 hours. Cardiac Enzymes: No results for input(s): CKTOTAL, CKMB, CKMBINDEX, TROPONINI in the last 168 hours. BNP (last 3 results) No results for  input(s): PROBNP in the last 8760 hours. HbA1C: No results for input(s): HGBA1C in the last 72 hours. CBG: Recent Labs  Lab 11/06/18 0820 11/06/18 1305 11/06/18 1555 11/06/18 2202 11/07/18 0800  GLUCAP 76 89 69* 121* 80   Lipid Profile: No results for input(s): CHOL, HDL, LDLCALC, TRIG, CHOLHDL, LDLDIRECT in the last 72 hours. Thyroid Function Tests: No results for input(s): TSH, T4TOTAL, FREET4, T3FREE, THYROIDAB in the last 72 hours. Anemia Panel: No results for input(s): VITAMINB12, FOLATE, FERRITIN, TIBC, IRON, RETICCTPCT in the last 72 hours. Urine analysis:    Component Value Date/Time   COLORURINE AMBER (A) 11/05/2018 1500   APPEARANCEUR CLOUDY (A) 11/05/2018 1500   LABSPEC 1.023 11/05/2018 1500   PHURINE 7.0 11/05/2018 1500   GLUCOSEU NEGATIVE 11/05/2018 1500   HGBUR MODERATE (A) 11/05/2018 1500   BILIRUBINUR NEGATIVE 11/05/2018 1500   KETONESUR NEGATIVE 11/05/2018 1500   PROTEINUR 100 (A) 11/05/2018 1500   NITRITE NEGATIVE 11/05/2018 1500   LEUKOCYTESUR LARGE (A) 11/05/2018 1500   Sepsis Labs: @LABRCNTIP (procalcitonin:4,lacticidven:4)  ) Recent Results (from the past 240 hour(s))  Blood culture (routine x 2)     Status: Abnormal (Preliminary result)   Collection Time: 11/05/18  1:59 PM  Result Value Ref Range Status   Specimen Description BLOOD LEFT HAND  Final   Special Requests   Final    BOTTLES DRAWN AEROBIC AND ANAEROBIC Blood Culture adequate volume Performed at [facility], [address] [place], [place] [address]    Culture  Setup Time   Final    GRAM POSITIVE COCCI AEROBIC BOTTLE ONLY CRITICAL RESULT CALLED TO, READ BACK BY AND VERIFIED WITH: Lytle Butte PharmD 13:35 11/06/18 ([email or web address])    Culture (A)  Final    STAPHYLOCOCCUS SPECIES (COAGULASE NEGATIVE) THE SIGNIFICANCE OF ISOLATING THIS ORGANISM FROM A SINGLE SET OF BLOOD CULTURES WHEN MULTIPLE SETS ARE DRAWN IS UNCERTAIN. PLEASE NOTIFY THE MICROBIOLOGY DEPARTMENT WITHIN ONE WEEK IF  SPECIATION AND SENSITIVITIES ARE REQUIRED. PROVIDENCIA STUARTII CRITICAL RESULT CALLED TO, READ BACK BY AND VERIFIED WITH: Blanch Media [id number] FCP    Report Status PENDING  Incomplete  Blood culture (routine x 2)     Status: None (Preliminary result)   Collection Time: 11/05/18  1:59 PM  Result Value Ref Range Status   Specimen Description BLOOD RIGHT HAND  Final   Special Requests   Final    BOTTLES DRAWN AEROBIC AND ANAEROBIC Blood Culture adequate volume Performed at [facility], [address], [place], [place] [address]    Culture NO GROWTH 2 DAYS  Final   Report Status PENDING  Incomplete  Blood Culture ID Panel (Reflexed)     Status: Abnormal   Collection Time: 11/05/18  1:59 PM  Result Value Ref Range Status   Enterococcus species NOT DETECTED NOT DETECTED Final   Listeria monocytogenes NOT DETECTED NOT DETECTED Final   Staphylococcus species DETECTED (A) NOT DETECTED Final    Comment: Methicillin (oxacillin) resistant coagulase negative staphylococcus. Possible blood culture contaminant (unless isolated from more than one blood culture draw or clinical case suggests pathogenicity). No antibiotic treatment is indicated for blood  culture contaminants. CRITICAL RESULT CALLED TO, READ BACK BY AND VERIFIED WITH: Lytle Butte PharmD 13:35 11/06/18 ([email or web address])    Staphylococcus aureus (BCID) NOT DETECTED NOT DETECTED Final   Methicillin resistance DETECTED (A) NOT DETECTED Final    Comment: CRITICAL RESULT CALLED TO, READ BACK BY AND VERIFIED WITH: Lytle Butte PharmD 13:35 11/06/18 ([email or web address])    Streptococcus species NOT DETECTED NOT DETECTED Final   Streptococcus agalactiae NOT DETECTED NOT DETECTED Final   Streptococcus pneumoniae NOT DETECTED NOT DETECTED Final   Streptococcus pyogenes NOT DETECTED NOT DETECTED Final   Acinetobacter baumannii NOT DETECTED NOT DETECTED Final   Enterobacteriaceae species NOT DETECTED NOT DETECTED Final   Enterobacter cloacae complex NOT  DETECTED NOT DETECTED Final   Escherichia coli NOT DETECTED NOT DETECTED Final   Klebsiella oxytoca NOT DETECTED NOT DETECTED Final   Klebsiella pneumoniae NOT DETECTED NOT DETECTED Final   Proteus species NOT DETECTED NOT DETECTED Final   Serratia marcescens NOT DETECTED NOT DETECTED Final   Haemophilus influenzae NOT DETECTED NOT DETECTED Final   Neisseria meningitidis NOT DETECTED NOT DETECTED Final   Pseudomonas aeruginosa NOT DETECTED NOT DETECTED Final   Candida albicans NOT DETECTED NOT DETECTED Final   Candida glabrata NOT DETECTED NOT DETECTED Final   Candida krusei NOT DETECTED NOT DETECTED Final   Candida parapsilosis NOT DETECTED NOT DETECTED Final   Candida tropicalis NOT DETECTED NOT DETECTED Final    Comment: Performed at [facility], [address] [place], [place] [address]  Urine culture     Status: Abnormal (Preliminary result)  Collection Time: 11/05/18  4:52 PM  Result Value Ref Range Status   Specimen Description URINE, CLEAN CATCH  Final   Special Requests NONE  Final   Culture (A)  Final    >=100,000 COLONIES/mL ESCHERICHIA COLI >=100,000 COLONIES/mL PROVIDENCIA STUARTII 60,000 COLONIES/mL ENTEROCOCCUS FAECALIS SUSCEPTIBILITIES TO FOLLOW Performed at [facility], [address], [place], [place] [address]    Report Status PENDING  Incomplete      Studies: No results found.  Scheduled Meds: . aspirin  81 mg Per Tube Daily  . chlorhexidine  15 mL Mouth Rinse BID  . diazepam  2 mg Per Tube Daily  . diazepam  5 mg Oral QHS  . enoxaparin (LOVENOX) injection  40 mg Subcutaneous Q24H  . famotidine  20 mg Per Tube BID  . feeding supplement (OSMOLITE 1.5 CAL)  320 mL Per Tube QID  . feeding supplement (PRO-STAT SUGAR FREE 64)  30 mL Per Tube Daily  . free water  250 mL Per Tube QID  . levETIRAcetam  1,000 mg Per Tube Q12H  . mouth rinse  15 mL Mouth Rinse q12n4p  . metoprolol tartrate  25 mg Per Tube Q12H  . saccharomyces boulardii  250 mg  Per Tube BID  . sodium chloride flush  10-40 mL Intracatheter Q12H    Continuous Infusions: . sodium chloride 75 mL/hr at 11/05/18 2057  . piperacillin-tazobactam (ZOSYN)  IV 3.375 g (11/07/18 0612)     LOS: 2 days     Darlin Drop, MD [facility] Pager [phone]  If 7PM-7AM, please contact night-coverage [email or web address] Password [email or web address] 11/07/2018, 3:20 PM

## 2018-11-07 NOTE — Progress Notes (Signed)
RT note: RT called sat for trach patient vomiting and desating. Patient in distress with sats of 90-92% on 100% trach colar and a HR of 144. RT called rapid response and charge RT. RT placed patient on ventilator for transport to 15M. MD at bedside and aware of patient on ventilator. Stat cxr pending.

## 2018-11-07 NOTE — Progress Notes (Signed)
Bedside EEG reviewed briefly.  He is not in non-convulsive status epilepticus.  There are no electrographic seizures.  There are scattered intermittent epileptiform discharges indicative of seizure tendency.    Re-start Vimpat.

## 2018-11-07 NOTE — Progress Notes (Addendum)
Pt arrived to 4NICU around 1630.  CCM NP assessed pt by bedside and notified of heart rate sustaining in 130s and blood pressure.  Verbal order for 500 ml saline bolus.  Neurology MD also assessed pt with EEG.  RN will continue to monitor.   1800:  Pt HR sustaining in 120s and low BP, CCM paged and verbal order for 500 ml LR bolus.

## 2018-11-07 NOTE — Progress Notes (Signed)
STAT EEG completed; results pending. Dr Thad Ranger notified.

## 2018-11-07 NOTE — Progress Notes (Signed)
Patient started vomiting while in the room.  MD notified.  Pt given IV zofran. MD placed new orders for possible aspiration. Respiratory called and they suctioned pt and connected pt to ventilator.  Rapid response called.  Rapid Response nurse inserted NG Tube.  Greggory Stallion RN given IV ativan per MD order.  Pt transferred to ICU.

## 2018-11-07 NOTE — Significant Event (Signed)
Rapid Response Event Note  Overview: Time Called: 1518 Arrival Time: 1520 Event Type: Respiratory  Initial Focused Assessment: Called to Bedside because trach pt in "respiratory distress" being placed on vent.   Dr Margo Aye at bedside Patient recently vomited (yellow, gastric) concern for aspirated. Upon my arrival patient appeared to be seizing (right arm and face, nystagmus).  Patient incontinent of bowl and bladder.   HR 130s,  BP 99/66  RR 18 on Ventilator, O2 sats 98%  Interventions: Patient placed on ventilator PCXR done NG tube placed KUB done Dr Tonia Brooms at bedside to assess patient. 4mg  Ativan given IV Seizure activity dissipated.   Transferred to 4N ICU rm 29 via bed with O2 via ventilator.   Plan of Care (if not transferred):  Event Summary: Name of Physician Notified: Dr Margo Aye at bedside prior to my arrival at    Name of Consulting Physician Notified: Dr Tonia Brooms at    Outcome: Transferred (Comment)(4N29)  Event End Time: 1615  Dean Graham

## 2018-11-07 NOTE — Progress Notes (Signed)
Pt hypotensive. Care discussed with [facility] MD. MD to enter orders.

## 2018-11-07 NOTE — Procedures (Signed)
ELECTROENCEPHALOGRAM REPORT   Patient: Dean Graham       Room #: 9O70J EEG No. ID: [id number] Age: 58 y.o.        Sex: male Referring Physician: Margo Aye Report Date:  11/07/2018        Interpreting Physician: Thana Farr  History: Dean Graham is an 58 y.o. 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  Medications:  ASA, Valium, Pepcid, Lopressor, Zosyn, Keppra  Conditions of Recording:  This is a 21 channel routine scalp EEG performed with bipolar and monopolar montages arranged in accordance to the international 10/20 system of electrode placement. One channel was dedicated to EKG recording.  The patient is in the intubated and sedated state.  Description:  Th background rhythm consists of poorly organized, low voltage generalized delta slowing intermixed with fast beta activity which resembles sleep activity.  A breach rhythm observed in the frontocentral leads, most particularly over the right hemisphere.  Intermittent bifrontal delta slowing is noted as well.  Also noted intermittently are left frontotemporal sharp transients that at times generalize.  No evidence of electrographic seizure activity is noted though.  . Hyperventilation and intermittent photic stimulation were not performed.     IMPRESSION: This is an abnormal electroencephalogram secondary to a breach rhythm and left frontotemporal sharp transients which are consistent with the patient's history.  No evidence of electrographic seizure activity is noted    Thana Farr, MD Neurology [phone] 11/07/2018, 4:52 PM

## 2018-11-07 NOTE — Consult Note (Addendum)
NAME:  Dean Graham, MRN:  [id number], DOB:  Apr 03, 1961, LOS: 2 ADMISSION DATE:  11/05/2018, CONSULTATION DATE:  2/28 REFERRING MD:  Dr. Margo Aye, CHIEF COMPLAINT:  Respiratory Distress    Brief History   58 y/o M, [facility] Resident, with TBI / quadriplegia chronic trach admitted to [facility] on 2/26 with concerns for vomiting and aspiration.  On 2/28 had recurrent episode with respiratory distress, pt transferred to ICU.   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    Initial work up included a CXR with chronic bibasilar atelectasis, Na 145, K 5, cl 111, CO2 26, glucose 148, BUN 49, sr cr 1.31, BNP 115, lactic acid 1.4, WBC 9.4, Hgb 11, MCV 101 and platelets 192.  Cultures obtained show providencia in the blood.  UC growing providencia, e-coli & enterococcus.    On 2/28 he suffered what appeared to be a seizure & subsequent vomiting with respiratory distress, pt transferred to ICU.   Past Medical History  Quadriplegia  GERD Dysphagia  dCHF  DM  Chronic Respiratory Failure  CHF  AF  Seizures   Significant Hospital Events   2/26 Admit from [facility]  2/28 Concern for vomiting / aspiration, tx to ICU   Consults:  PCCM   Procedures:     Significant Diagnostic Tests:    Micro Data:  BCID 2/26 > coag neg staph 1/2, providencia stuartii >>   BCx2 2/26 >>  BCx2 2/28 >>  UC 2/26 >> greater 100k E-Coli >>           greater 100k providencia stuartii >>           60k enterococcus >>   Antimicrobials:  Zosyn 2/26 >>   Interim history/subjective:  As above   Objective   Blood pressure 109/80, pulse 91, temperature 97.7 F (36.5 C), temperature source Oral, resp. rate 17, height 5\' 7"  (1.702 m), SpO2 98 %.    Vent Mode: PRVC FiO2 (%):  [40 %-100 %] 100 % Set Rate:  [16 bmp] 16 bmp Vt Set:  [520 mL] 520 mL PEEP:  [5 cmH20] 5 cmH20   Intake/Output Summary (Last  24 hours) at 11/07/2018 1534 Last data filed at 11/07/2018 0504 Gross per 24 hour  Intake 799.94 ml  Output 900 ml  Net -100.06 ml   There were no vitals filed for this visit.  Examination: General: chronically ill appearing male lying in bed on vent  HENT: trach midline c/d/i, rhythmic twitching of eyes noted Lungs: even/non-labored, lungs bilaterally with rhonchi  Cardiovascular: s1s2 regular with ? holosystolic murmur  Abdomen: soft, decreased BS, PEG clean Extremities: contractures, multiple wounds of varying stages / dressings c/d/i Neuro: concern for ongoing seizure  Resolved Hospital Problem list     Assessment & Plan:    Seizure with concern for Status Epilepticus  -known seizure disorder  P: STAT EEG Neurology consult Ativan 4mg  IV now  Additional 1500 mg IV Keppra now  Continue Keppra, valium  Seizure precautions   Acute on Chronic Hypoxic Respiratory Failure  Providencia in Sputum -baseline chronic trach  -vomiting 2/28 with concern for aspiration  P: PRVC 8cc/kg  Wean PEEP / FiO2 for sats > 90% Assess ABG in one hour  Now CXR  Continue abx as above  Pulmonary hygiene as able   Concern for Aspiration with Vomiting  P: Aspiration precautions  HOB elevated  NPO  E-Coli, Providencia & Enterococcus UTI -baseline frequent UTI's  P: Continue zosyn  Follow culture to maturity   Hx dCHF, AF -SR on exam 2/28 P: ICU monitoring Hold home lasix, metoprolol   Nausea / Vomiting  Dysphagia  GERD  P: NPO  Hold TF for now > May not tolerate bolus feeding  PRN zofran   AKI  P: Trend BMP / urinary output Replace electrolytes as indicated Avoid nephrotoxic agents, ensure adequate renal perfusion  Hx TBI with Quadriplegia  P: Supportive care  Best practice:  Diet: NPO  Pain/Anxiety/Delirium protocol (if indicated): n/a  VAP protocol (if indicated): ordered  DVT prophylaxis: lovenox  GI prophylaxis: pepcid IV  Glucose control: n/a  Mobility:  bedrest  Code Status: Limited Code  Family Communication: No family available at bedside.  Disposition: ICU   Labs   CBC: Recent Labs  Lab 11/05/18 1338 11/06/18 0320  WBC 9.8 9.4  NEUTROABS 6.6  --   HGB 12.3* 11.0*  HCT 41.6 37.2*  MCV 101.7* 101.1*  PLT 200 192    Basic Metabolic Panel: Recent Labs  Lab 11/05/18 1338 11/06/18 0320  NA 145 145  K 5.0 4.5  CL 111 109  CO2 26 29  GLUCOSE 148* 107*  BUN 49* 40*  CREATININE 1.31* 1.20  CALCIUM 8.7* 8.3*   GFR: CrCl cannot be calculated (Unknown ideal weight.). Recent Labs  Lab 11/05/18 1338 11/05/18 1339 11/06/18 0320  WBC 9.8  --  9.4  LATICACIDVEN  --  1.4  --     Liver Function Tests: Recent Labs  Lab 11/05/18 1338  AST 24  ALT 19  ALKPHOS 59  BILITOT 0.6  PROT 7.1  ALBUMIN 2.5*   No results for input(s): LIPASE, AMYLASE in the last 168 hours. No results for input(s): AMMONIA in the last 168 hours.  ABG    Component Value Date/Time   PHART 7.403 08/05/2017 1525   PCO2ART 47.6 08/05/2017 1525   PO2ART 94.0 08/05/2017 1525   HCO3 29.8 (H) 08/05/2017 1525   TCO2 31 08/05/2017 1525   O2SAT 97.0 08/05/2017 1525     Coagulation Profile: No results for input(s): INR, PROTIME in the last 168 hours.  Cardiac Enzymes: No results for input(s): CKTOTAL, CKMB, CKMBINDEX, TROPONINI in the last 168 hours.  HbA1C: Hgb A1c MFr Bld  Date/Time Value Ref Range Status  04/09/2017 09:10 AM 4.5 (L) 4.8 - 5.6 % Final    Comment:    (NOTE)         Pre-diabetes: 5.7 - 6.4         Diabetes: >6.4         Glycemic control for adults with diabetes: <7.0     CBG: Recent Labs  Lab 11/06/18 0820 11/06/18 1305 11/06/18 1555 11/06/18 2202 11/07/18 0800  GLUCAP 76 89 69* 121* 80    Review of Systems:   Unable to complete as patient is altered on mechanical ventilation.   Past Medical History  He,  has a past medical history of Atrial fibrillation (HCC), CHF (congestive heart failure) (HCC), Chronic  respiratory failure (HCC), Diabetes mellitus without complication (HCC), Diastolic heart failure (HCC), Dysphagia, GERD (gastroesophageal reflux disease), Quadriplegia (HCC), and Seizures (HCC).   Surgical History    Past Surgical History:  Procedure Laterality Date  . IR GASTROSTOMY TUBE MOD SED  01/11/2017     Social History   reports that he has never smoked. He has never used smokeless tobacco. He reports that he does not  drink alcohol or use drugs.   Family History   His family history is not on file.   Allergies Allergies  Allergen Reactions  . Ativan [Lorazepam] Other (See Comments)    unknown  . Azithromycin Other (See Comments)    Blisters all over body  . Ceftriaxone Other (See Comments)    Blisters all over body  . Vancomycin Other (See Comments)    Blisters all over body     Home Medications  Prior to Admission medications   Medication Sig Start Date End Date Taking? Authorizing Provider  acetaminophen (TYLENOL) 325 MG tablet Place 2 tablets (650 mg total) into feeding tube every 6 (six) hours as needed for mild pain (or Fever >/= 101). 04/15/17  Yes Lonia Blood, MD  aspirin 81 MG chewable tablet Place 81 mg into feeding tube daily.   Yes [provider]  chlorhexidine (PERIDEX) 0.12 % solution Use as directed 15 mLs in the mouth or throat 2 (two) times daily. Patient taking differently: Use as directed 5 mLs in the mouth or throat 2 (two) times daily. Every shift 04/15/17  Yes Lonia Blood, MD  diazepam (VALIUM) 2 MG tablet Place 1 tablet (2 mg total) into feeding tube daily. 09/14/18  Yes Rolly Salter, MD  diazepam (VALIUM) 5 MG tablet Take 1 tablet (5 mg total) by mouth at bedtime. 09/14/18  Yes Rolly Salter, MD  famotidine (PEPCID) 20 MG tablet Place 20 mg into feeding tube 2 (two) times daily.   Yes [provider]  glycopyrrolate (ROBINUL) 1 MG tablet Place 1 tablet (1 mg total) into feeding tube 2 (two) times daily as needed  (EXCESSIVE SECRETION). Patient taking differently: Place 1 mg into feeding tube every 12 (twelve) hours.  09/14/18  Yes Rolly Salter, MD  levETIRAcetam (KEPPRA) 100 MG/ML solution Place 10 mLs (1,000 mg total) into feeding tube 2 (two) times daily. Patient taking differently: Place 1,000 mg into feeding tube every 12 (twelve) hours. For seizure 04/15/17  Yes Lonia Blood, MD  Loperamide HCl 1 MG/7.5ML LIQD Take 2 mg by mouth 3 (three) times daily as needed (diarrhea).   Yes [provider]  metoprolol tartrate (LOPRESSOR) 25 MG tablet Place 25 mg into feeding tube every 12 (twelve) hours.    Yes [provider]  Multiple Vitamin (MULTIVITAMIN WITH MINERALS) TABS tablet Place 1 tablet into feeding tube daily. 04/16/17  Yes Lonia Blood, MD  ondansetron (ZOFRAN) 4 MG tablet Place 1 tablet (4 mg total) into feeding tube every 6 (six) hours as needed for nausea. 04/15/17  Yes Lonia Blood, MD  saccharomyces boulardii (FLORASTOR) 250 MG capsule Place 1 capsule (250 mg total) into feeding tube 2 (two) times daily. 04/15/17  Yes Lonia Blood, MD  traMADol (ULTRAM) 50 MG tablet Place 1 tablet (50 mg total) into feeding tube 2 (two) times daily. 09/14/18  Yes Rolly Salter, MD     Critical care time: 30 minutes     Canary Brim, NP-C [facility] Pgr: [phone] or if no answer [phone] 11/07/2018, 3:34 PM

## 2018-11-07 NOTE — Plan of Care (Signed)
  Problem: Skin Integrity: Goal: Risk for impaired skin integrity will decrease 11/07/2018 1125 by Isla Pence, RN Outcome: Progressing 11/07/2018 1124 by Isla Pence, RN Outcome: Not Progressing

## 2018-11-07 NOTE — Progress Notes (Signed)
Stat order to give 4mg  of Ativan per Dr Tonia Brooms.

## 2018-11-07 NOTE — Progress Notes (Signed)
[facility] MD made aware of hypotension. See orders.

## 2018-11-07 NOTE — Plan of Care (Signed)
  Problem: Clinical Measurements: Goal: Ability to maintain clinical measurements within normal limits will improve Outcome: Progressing Goal: Respiratory complications will improve Outcome: Progressing Goal: Cardiovascular complication will be avoided Outcome: Progressing   Problem: Nutrition: Goal: Adequate nutrition will be maintained Outcome: Progressing   Problem: Elimination: Goal: Will not experience complications related to bowel motility Outcome: Progressing   Problem: Pain Managment: Goal: General experience of comfort will improve Outcome: Progressing

## 2018-11-07 NOTE — Consult Note (Signed)
Reason for Consult: Seizures Referring Physician: ICU  Dean Graham is an 58 y.o. male.  HPI:  He has a history of TBI with resultant quadriplegia.  He was admitted with a fever of 100.6 and found to have UTI with positive urine culture >100,000 colonies of E. Coli and Providencia.  He was being treated with Zosyn.  He has a known seizure disorder secondary to TBI and was supposed to be on Keppra and Vimpat, but for some unknown reason was getting only Keppra.  CBC was normal except for Hg 11.  CMP was normal except for BUN 40 and Cr 1.2.  Earlier today, he was witnessed to have rhythmic right sided movement with possible eye deviation to the right, vomiting, and altered mental status.  He was transferred to neuro ICU.  No imaging on this admission.  EEG is being performed at this time.  Past Medical History:  Diagnosis Date  . Atrial fibrillation ([place])   . CHF (congestive heart failure) ([place])   . Chronic respiratory failure ([place])   . Diabetes mellitus without complication ([place])   . Diastolic heart failure ([place])   . Dysphagia   . GERD (gastroesophageal reflux disease)   . Quadriplegia ([place])   . Seizures ([place])     Past Surgical History:  Procedure Laterality Date  . IR GASTROSTOMY TUBE MOD SED  01/11/2017    No family history on file.  Social History:  reports that he has never smoked. He has never used smokeless tobacco. He reports that he does not drink alcohol or use drugs.  Allergies:  Allergies  Allergen Reactions  . Ativan [Lorazepam] Other (See Comments)    unknown  . Azithromycin Other (See Comments)    Blisters all over body  . Ceftriaxone Other (See Comments)    Blisters all over body  . Vancomycin Other (See Comments)    Blisters all over body    Prior to Admission medications   Medication Sig Start Date End Date Taking? Authorizing Provider  acetaminophen (TYLENOL) 325 MG tablet Place 2 tablets (650 mg total) into feeding tube every 6 (six) hours as needed for  mild pain (or Fever >/= 101). 04/15/17  Yes Cherene Altes, MD  aspirin 81 MG chewable tablet Place 81 mg into feeding tube daily.   Yes [provider]  chlorhexidine (PERIDEX) 0.12 % solution Use as directed 15 mLs in the mouth or throat 2 (two) times daily. Patient taking differently: Use as directed 5 mLs in the mouth or throat 2 (two) times daily. Every shift 04/15/17  Yes Cherene Altes, MD  diazepam (VALIUM) 2 MG tablet Place 1 tablet (2 mg total) into feeding tube daily. 09/14/18  Yes Lavina Hamman, MD  diazepam (VALIUM) 5 MG tablet Take 1 tablet (5 mg total) by mouth at bedtime. 09/14/18  Yes Lavina Hamman, MD  famotidine (PEPCID) 20 MG tablet Place 20 mg into feeding tube 2 (two) times daily.   Yes [provider]  glycopyrrolate (ROBINUL) 1 MG tablet Place 1 tablet (1 mg total) into feeding tube 2 (two) times daily as needed (EXCESSIVE SECRETION). Patient taking differently: Place 1 mg into feeding tube every 12 (twelve) hours.  09/14/18  Yes Lavina Hamman, MD  levETIRAcetam (KEPPRA) 100 MG/ML solution Place 10 mLs (1,000 mg total) into feeding tube 2 (two) times daily. Patient taking differently: Place 1,000 mg into feeding tube every 12 (twelve) hours. For seizure 04/15/17  Yes Cherene Altes, MD  Loperamide  HCl 1 MG/7.5ML LIQD Take 2 mg by mouth 3 (three) times daily as needed (diarrhea).   Yes [provider]  metoprolol tartrate (LOPRESSOR) 25 MG tablet Place 25 mg into feeding tube every 12 (twelve) hours.    Yes [provider]  Multiple Vitamin (MULTIVITAMIN WITH MINERALS) TABS tablet Place 1 tablet into feeding tube daily. 04/16/17  Yes Cherene Altes, MD  ondansetron (ZOFRAN) 4 MG tablet Place 1 tablet (4 mg total) into feeding tube every 6 (six) hours as needed for nausea. 04/15/17  Yes Cherene Altes, MD  saccharomyces boulardii (FLORASTOR) 250 MG capsule Place 1 capsule (250 mg total) into feeding tube 2 (two) times daily. 04/15/17   Yes Cherene Altes, MD  traMADol (ULTRAM) 50 MG tablet Place 1 tablet (50 mg total) into feeding tube 2 (two) times daily. 09/14/18  Yes Lavina Hamman, MD    Medications:  Scheduled: . aspirin  81 mg Per Tube Daily  . chlorhexidine  15 mL Mouth Rinse BID  . chlorhexidine gluconate (MEDLINE KIT)  15 mL Mouth Rinse BID  . diazepam  2 mg Per Tube Daily  . diazepam  5 mg Oral QHS  . enoxaparin (LOVENOX) injection  40 mg Subcutaneous Q24H  . famotidine  20 mg Per Tube BID  . feeding supplement (PRO-STAT SUGAR FREE 64)  30 mL Per Tube Daily  . [START ON 11/08/2018] free water  250 mL Per Tube QID  . mouth rinse  15 mL Mouth Rinse 10 times per day  . metoprolol tartrate  25 mg Per Tube Q12H  . saccharomyces boulardii  250 mg Per Tube BID  . sodium chloride flush  10-40 mL Intracatheter Q12H    Results for orders placed or performed during the hospital encounter of 11/05/18 (from the past 48 hour(s))  Urine culture     Status: Abnormal (Preliminary result)   Collection Time: 11/05/18  4:52 PM  Result Value Ref Range   Specimen Description URINE, CLEAN CATCH    Special Requests NONE    Culture (A)     >=100,000 COLONIES/mL ESCHERICHIA COLI >=100,000 COLONIES/mL PROVIDENCIA STUARTII 60,000 COLONIES/mL ENTEROCOCCUS FAECALIS SUSCEPTIBILITIES TO FOLLOW Performed at [facility] [address], [place], [place] [address]    Report Status PENDING   Glucose, capillary     Status: None   Collection Time: 11/05/18 10:30 PM  Result Value Ref Range   Glucose-Capillary 92 70 - 99 mg/dL  CBC     Status: Abnormal   Collection Time: 11/06/18  3:20 AM  Result Value Ref Range   WBC 9.4 4.0 - 10.5 K/uL   RBC 3.68 (L) 4.22 - 5.81 MIL/uL   Hemoglobin 11.0 (L) 13.0 - 17.0 g/dL   HCT 37.2 (L) 39.0 - 52.0 %   MCV 101.1 (H) 80.0 - 100.0 fL   MCH 29.9 26.0 - 34.0 pg   MCHC 29.6 (L) 30.0 - 36.0 g/dL   RDW 13.8 11.5 - 15.5 %   Platelets 192 150 - 400 K/uL   nRBC 0.0 0.0 - 0.2 %    Comment:  Performed at [facility], [place] [address], [place], [place] [address]  Basic metabolic panel     Status: Abnormal   Collection Time: 11/06/18  3:20 AM  Result Value Ref Range   Sodium 145 135 - 145 mmol/L   Potassium 4.5 3.5 - 5.1 mmol/L   Chloride 109 98 - 111 mmol/L   CO2 29 22 -  32 mmol/L   Glucose, Bld 107 (H) 70 - 99 mg/dL   BUN 40 (H) 6 - 20 mg/dL   Creatinine, Ser 1.20 0.61 - 1.24 mg/dL   Calcium 8.3 (L) 8.9 - 10.3 mg/dL   GFR calc non Af Amer >60 >60 mL/min   GFR calc Af Amer >60 >60 mL/min   Anion gap 7 5 - 15    Comment: Performed at [facility] [address] [place], [place] [address]  Glucose, capillary     Status: None   Collection Time: 11/06/18  8:20 AM  Result Value Ref Range   Glucose-Capillary 76 70 - 99 mg/dL  HIV antibody (Routine Testing)     Status: None   Collection Time: 11/06/18  9:51 AM  Result Value Ref Range   HIV Screen 4th Generation wRfx Non Reactive Non Reactive    Comment: (NOTE) Performed At: [facility] [address] [place], [place] [id number] Rush Farmer MD [id number]   Glucose, capillary     Status: None   Collection Time: 11/06/18  1:05 PM  Result Value Ref Range   Glucose-Capillary 89 70 - 99 mg/dL  Glucose, capillary     Status: Abnormal   Collection Time: 11/06/18  3:55 PM  Result Value Ref Range   Glucose-Capillary 69 (L) 70 - 99 mg/dL  Glucose, capillary     Status: Abnormal   Collection Time: 11/06/18 10:02 PM  Result Value Ref Range   Glucose-Capillary 121 (H) 70 - 99 mg/dL  Glucose, capillary     Status: None   Collection Time: 11/07/18  8:00 AM  Result Value Ref Range   Glucose-Capillary 80 70 - 99 mg/dL    Dg Chest Port 1 View  Result Date: 11/07/2018 CLINICAL DATA:  Hypoxia EXAM: PORTABLE CHEST 1 VIEW COMPARISON:  11/05/2018 FINDINGS: Tracheostomy remains in place, unchanged. Interval placement of right PICC line with the tip at the cavoatrial junction. Bibasilar atelectasis or  infiltrates, similar to prior study on the right, slightly improved on the left. Mild cardiomegaly. IMPRESSION: Right PICC line is been placed with the tip at the cavoatrial junction. Bibasilar atelectasis or infiltrates, slight improvement on the left. Electronically Signed   By: Rolm Baptise M.D.   On: 11/07/2018 15:39   Dg Abd Portable 1v  Result Date: 11/07/2018 CLINICAL DATA:  58 y/o  M; constipation. EXAM: PORTABLE ABDOMEN - 1 VIEW COMPARISON:  None. FINDINGS: Peg tube noted. Enteric tube tip projects over gastric body. Nonobstructive bowel gas pattern. No pneumoperitoneum. No acute osseous abnormality is evident. IMPRESSION: Nonobstructive bowel gas pattern. Enteric tube tip projects over gastric body. Electronically Signed   By: Kristine Garbe M.D.   On: 11/07/2018 15:53    ROS Blood pressure 99/66, pulse (!) 133, temperature 97.7 F (36.5 C), temperature source Oral, resp. rate 18, height _0  (1.702 m), SpO2 98 %. Neurologic Examination:  Eyes closed and blinking occasionally fast.  Not following commands. Trach to the ventilator.   Eyes are jumping around and dysconjugate. Bilateral UE and LE spasticity.  No rhythmic movements now (s/p Ativan 4 mg IV). No babinski.  No hoffman's. Sensory , coord, gait - could not be performed.   Not withdrawing to pain but some facial grimacing.  Assessment/Plan:  Probable seizure breakthrough or non-convulsive status epilepticus secondary to Vimpat abrupt withdrawal and UTI lowering seizure threshold.  Keppra is maximized.  I will read the bedside EEG and take further action as necessary, including re-starting the Vimpat.  Rogue Jury, MS, MD 11/07/2018, 4:36 PM

## 2018-11-08 ENCOUNTER — Inpatient Hospital Stay (HOSPITAL_COMMUNITY): Payer: Medicare Other

## 2018-11-08 DIAGNOSIS — J9610 Chronic respiratory failure, unspecified whether with hypoxia or hypercapnia: Secondary | ICD-10-CM

## 2018-11-08 LAB — CULTURE, BLOOD (ROUTINE X 2): Special Requests: ADEQUATE

## 2018-11-08 LAB — BASIC METABOLIC PANEL
Anion gap: 7 (ref 5–15)
BUN: 24 mg/dL — ABNORMAL HIGH (ref 6–20)
CHLORIDE: 116 mmol/L — AB (ref 98–111)
CO2: 25 mmol/L (ref 22–32)
Calcium: 7.9 mg/dL — ABNORMAL LOW (ref 8.9–10.3)
Creatinine, Ser: 0.95 mg/dL (ref 0.61–1.24)
GFR calc Af Amer: >60 mL/min (ref >60)
GFR calc non Af Amer: >60 mL/min (ref >60)
Glucose, Bld: 87 mg/dL (ref 70–99)
Potassium: 3.6 mmol/L (ref 3.5–5.1)
Sodium: 148 mmol/L — ABNORMAL HIGH (ref 135–145)

## 2018-11-08 LAB — CBC
HCT: 31.1 % — ABNORMAL LOW (ref 39.0–52.0)
Hemoglobin: 9.3 g/dL — ABNORMAL LOW (ref 13.0–17.0)
MCH: 30 pg (ref 26.0–34.0)
MCHC: 29.9 g/dL — ABNORMAL LOW (ref 30.0–36.0)
MCV: 100.3 fL — AB (ref 80.0–100.0)
Platelets: 190 10*3/uL (ref 150–400)
RBC: 3.1 MIL/uL — ABNORMAL LOW (ref 4.22–5.81)
RDW: 13.8 % (ref 11.5–15.5)
WBC: 12.1 10*3/uL — ABNORMAL HIGH (ref 4.0–10.5)
nRBC: 0 % (ref 0.0–0.2)

## 2018-11-08 LAB — OCCULT BLOOD GASTRIC / DUODENUM (SPECIMEN CUP): Occult Blood, Gastric: POSITIVE — AB

## 2018-11-08 MED ORDER — POTASSIUM CHLORIDE 20 MEQ/15ML (10%) PO SOLN
40.0000 meq | Freq: Once | ORAL | Status: AC
  Administered 2018-11-08: 40 meq

## 2018-11-08 MED ORDER — POTASSIUM CHLORIDE 20 MEQ/15ML (10%) PO SOLN
40.0000 meq | Freq: Once | ORAL | Status: DC
  Filled 2018-11-08: qty 30

## 2018-11-08 NOTE — Progress Notes (Signed)
RT NOTE:  Pt was transported to CT scan and back to 4N29 on ventilator.

## 2018-11-08 NOTE — Procedures (Signed)
Arterial Catheter Insertion Procedure Note Dean Graham [id number] 08-10-1961  Procedure: Insertion of Arterial Catheter  Indications: Blood pressure monitoring and Frequent blood sampling  Procedure Details Consent: Risks of procedure as well as the alternatives and risks of each were explained to the (patient/caregiver).  Consent for procedure obtained. and Unable to obtain consent because of altered level of consciousness. Time Out: Verified patient identification, verified procedure, site/side was marked, verified correct patient position, special equipment/implants available, medications/allergies/relevent history reviewed, required imaging and test results available.  Performed  Maximum sterile technique was used including antiseptics, cap, gloves, hand hygiene and mask. Skin prep: Chlorhexidine; local anesthetic administered 20 gauge catheter was inserted into left radial artery using the Seldinger technique. ULTRASOUND GUIDANCE USED: YES Evaluation Blood flow good; BP tracing good. Complications: No apparent complications.   Leafy Half 11/08/2018

## 2018-11-08 NOTE — Progress Notes (Addendum)
Neurology Progress Note   S:// Patient seen and examined.  He failed his SBT this morning and will remain intubated. In terms of his mentation, he is responsive to some verbal stimulation today.  No seizure activity noted overnight.   O:// Current vital signs: BP 110/80   Pulse 86   Temp 98.5 F (36.9 C) (Oral)   Resp 16   Ht 5' 7" (1.702 m)   SpO2 100%   BMI 22.20 kg/m  Vital signs in last 24 hours: Temp:  [98.4 F (36.9 C)-102.5 F (39.2 C)] 98.5 F (36.9 C) (02/29 0800) Pulse Rate:  [52-133] 86 (02/29 1000) Resp:  [15-27] 16 (02/29 1000) BP: (71-121)/(52-83) 110/80 (02/29 1000) SpO2:  [98 %-100 %] 100 % (02/29 1000) Arterial Line BP: (92-135)/(58-73) 121/68 (02/29 1000) FiO2 (%):  [40 %-100 %] 40 % (02/29 0810) General: Patient has a tracheostomy, attached ventilator, no apparent distress HEENT: There is evidence of old crani scars on head CVS: S1-S2 heard, regular rhythm Respiration: Chest clear to auscultation Extremities: Left arm and leg are contracted, no edema in any of the 4 extremities. Neurological exam Eyes are closed, no nystagmus. Opens eyes to voice. Nods yes to some questions and attempted to say hi with his mouth without producing any sound. Pupils are equal round reactive light, slightly disconjugate gaze. Breathing over the ventilator Right upper and lower extremity mildly increased tone but left upper and lower extremity are spastic. Grimaces to noxious stimulation but no frank withdrawal.  Medications  Current Facility-Administered Medications:  .  0.9 %  sodium chloride infusion, , Intravenous, Continuous, Dessa Phi, DO, Last Rate: 75 mL/hr at 11/08/18 0331 .  0.9 %  sodium chloride infusion, , Intravenous, PRN, Ollis, Brandi L, NP, Last Rate: 10 mL/hr at 11/08/18 1000 .  acetaminophen (TYLENOL) tablet 650 mg, 650 mg, Per Tube, Q6H PRN **OR** acetaminophen (TYLENOL) suppository 650 mg, 650 mg, Rectal, Q6H PRN, Dessa Phi, DO, 650 mg at  11/07/18 1743 .  aspirin chewable tablet 81 mg, 81 mg, Per Tube, Daily, Dessa Phi, DO, 81 mg at 11/07/18 0951 .  chlorhexidine (PERIDEX) 0.12 % solution 15 mL, 15 mL, Mouth Rinse, BID, Hall, Carole N, DO, 15 mL at 11/07/18 0951 .  chlorhexidine gluconate (MEDLINE KIT) (PERIDEX) 0.12 % solution 15 mL, 15 mL, Mouth Rinse, BID, Ollis, Brandi L, NP, 15 mL at 11/08/18 0813 .  diazepam (VALIUM) tablet 2 mg, 2 mg, Per Tube, Daily, Dessa Phi, DO, 2 mg at 11/07/18 0951 .  diazepam (VALIUM) tablet 5 mg, 5 mg, Oral, QHS, Dessa Phi, DO, 5 mg at 11/07/18 2141 .  enoxaparin (LOVENOX) injection 40 mg, 40 mg, Subcutaneous, Q24H, Dessa Phi, DO, 40 mg at 11/07/18 2141 .  famotidine (PEPCID) tablet 20 mg, 20 mg, Per Tube, BID, Dessa Phi, DO, 20 mg at 11/07/18 2141 .  feeding supplement (PRO-STAT SUGAR FREE 64) liquid 30 mL, 30 mL, Per Tube, Daily, Hall, Carole N, DO, 30 mL at 11/07/18 0951 .  glycopyrrolate (ROBINUL) tablet 1 mg, 1 mg, Per Tube, BID PRN, Dessa Phi, DO .  lacosamide (VIMPAT) 100 mg in sodium chloride 0.9 % 25 mL IVPB, 100 mg, Intravenous, Q12H, Rogue Jury, MD, Stopped at 11/08/18 0003 .  levETIRAcetam (KEPPRA) IVPB 1500 mg/ 100 mL premix, 1,500 mg, Intravenous, Q12H, Kayleen Memos, DO, Stopped at 11/08/18 0827 .  loperamide HCl (IMODIUM) 1 MG/7.5ML suspension 2 mg, 2 mg, Per Tube, TID PRN, Dessa Phi, DO .  MEDLINE mouth rinse, 15 mL,  Mouth Rinse, 10 times per day, Ollis, Brandi L, NP, 15 mL at 11/08/18 0541 .  metoprolol tartrate (LOPRESSOR) tablet 25 mg, 25 mg, Per Tube, Q12H, Dessa Phi, DO, 25 mg at 11/07/18 6222 .  ondansetron (ZOFRAN) tablet 4 mg, 4 mg, Per Tube, Q6H PRN **OR** ondansetron (ZOFRAN) injection 4 mg, 4 mg, Intravenous, Q6H PRN, Dessa Phi, DO, 4 mg at 11/07/18 1450 .  piperacillin-tazobactam (ZOSYN) IVPB 3.375 g, 3.375 g, Intravenous, Q8H, Kayleen Memos, DO, Stopped at 11/08/18 9798 .  polyethylene glycol (MIRALAX / GLYCOLAX) packet  17 g, 17 g, Per Tube, Daily PRN, Dessa Phi, DO .  potassium chloride 20 MEQ/15ML (10%) solution 40 mEq, 40 mEq, Oral, Once, Groce, Sarah F, NP .  saccharomyces boulardii (FLORASTOR) capsule 250 mg, 250 mg, Per Tube, BID, Dessa Phi, DO, 250 mg at 11/07/18 2141 .  sodium chloride flush (NS) 0.9 % injection 10-40 mL, 10-40 mL, Intracatheter, Q12H, Dessa Phi, DO, 20 mL at 11/07/18 2154 .  sodium chloride flush (NS) 0.9 % injection 10-40 mL, 10-40 mL, Intracatheter, PRN, Dessa Phi, DO, 10 mL at 11/07/18 0504 Labs CBC    Component Value Date/Time   WBC 12.1 (H) 11/08/2018 0613   RBC 3.10 (L) 11/08/2018 0613   HGB 9.3 (L) 11/08/2018 0613   HCT 31.1 (L) 11/08/2018 0613   PLT 190 11/08/2018 0613   MCV 100.3 (H) 11/08/2018 0613   MCH 30.0 11/08/2018 0613   MCHC 29.9 (L) 11/08/2018 0613   RDW 13.8 11/08/2018 0613   LYMPHSABS 0.5 (L) 11/07/2018 2023   MONOABS 0.5 11/07/2018 2023   EOSABS 0.0 11/07/2018 2023   BASOSABS 0.0 11/07/2018 2023    CMP     Component Value Date/Time   NA 148 (H) 11/08/2018 0613   K 3.6 11/08/2018 0613   CL 116 (H) 11/08/2018 0613   CO2 25 11/08/2018 0613   GLUCOSE 87 11/08/2018 0613   BUN 24 (H) 11/08/2018 0613   CREATININE 0.95 11/08/2018 0613   CALCIUM 7.9 (L) 11/08/2018 0613   PROT 6.3 (L) 11/07/2018 2023   ALBUMIN 2.4 (L) 11/07/2018 2023   AST 30 11/07/2018 2023   ALT 20 11/07/2018 2023   ALKPHOS 51 11/07/2018 2023   BILITOT 0.3 11/07/2018 2023   GFRNONAA >60 11/08/2018 0613   GFRAA >60 11/08/2018 9211    Imaging No brain imaging to review.   Assessment: Mr. 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 He also has a history of seizure disorder-was on Keppra and Vimpat at [facility], but was only on Keppra during the hospitalization. He has a seizure-like activity followed by an aspiration  event of vomiting and for which he was brought to the ICU for further management. Critical CARE note mentions witnessing right-sided tonic-clonic rhythmic movement of the contracted arm which resolved after 4 mg of Ativan.  Stat EEG negative for seizures.  Today's exam shows him following some commands and nearing his baseline.  Impression: Breakthrough seizure in the setting of lowered threshold due to infection versus due to noncompliance with antiepileptic Status epilepticus-resolved Toxic/metabolic encephalopathy  Recommendations: Continue Keppra and Vimpat Continue seizure precautions Treatment of infection per primary team as you are Chart review was attempted-not much in terms of prior neurological care.  There was a note about phenobarbital levels but according to the [facility] facility, we called  over the phone, he has not been on phenobarbital since he has been there.  He has been at [facility] for 3 years. Consider head CT or MRI as he becomes more stable to rule out any acute process. We will follow with you.  -- Amie Portland, MD [facility] Pager: [phone] If 7pm to 7am, please call on call as listed on AMION.  CRITICAL CARE ATTESTATION Performed by: Amie Portland, MD Total critical care time: 20 minutes Critical care time was exclusive of separately billable procedures and treating other patients and/or supervising APPs/Residents/Students Critical care was necessary to treat or prevent imminent or life-threatening deterioration due to toxic metabolic encephalopathy, breakthrough seizure, status epilepticus This patient is critically ill and at significant risk for neurological worsening and/or death and care requires constant monitoring. Critical care was time spent personally by me on the following activities: development of treatment plan with patient and/or surrogate as well as nursing, discussions with consultants, evaluation of patient's response to treatment,  examination of patient, obtaining history from patient or surrogate, ordering and performing treatments and interventions, ordering and review of laboratory studies, ordering and review of radiographic studies, pulse oximetry, re-evaluation of patient's condition, participation in multidisciplinary rounds and medical decision making of high complexity in the care of this patient.

## 2018-11-08 NOTE — Progress Notes (Addendum)
NAME:  Dean Graham, MRN:  [id number], DOB:  14-Dec-1960, LOS: 3 ADMISSION DATE:  11/05/2018, CONSULTATION DATE:  2/28 REFERRING MD:  Dr. Margo Aye, CHIEF COMPLAINT:  Respiratory Distress    Brief History   58 y/o M, [facility] Resident, with TBI / quadriplegia chronic trach admitted to [facility] on 2/26 with concerns for vomiting and aspiration.  On 2/28 had recurrent episode with respiratory distress, pt transferred to ICU.   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    Initial work up included a CXR with chronic bibasilar atelectasis, Na 145, K 5, cl 111, CO2 26, glucose 148, BUN 49, sr cr 1.31, BNP 115, lactic acid 1.4, WBC 9.4, Hgb 11, MCV 101 and platelets 192.  Cultures obtained show providencia in the blood.  UC growing providencia, e-coli & enterococcus.    On 2/28 he suffered what appeared to be a seizure & subsequent vomiting with respiratory distress, pt transferred to ICU.Concern of aspiration   Past Medical History  Quadriplegia  GERD Dysphagia  dCHF  DM  Chronic Respiratory Failure  CHF  AF  Seizures   Significant Hospital Events   2/26 Admit from [facility]  2/28 Concern for vomiting / aspiration, tx to ICU   Consults:  PCCM   Procedures:  Intubation 2/28>>  Significant Diagnostic Tests:  EEG 11/07/2018 This is an abnormal electroencephalogram secondary to a breach rhythm and left frontotemporal sharp transients which are consistent with the patient's history.   No evidence of electrographic seizure activity is noted   2/28 KUB Nonobstructive bowel gas pattern. Enteric tube tip projects over gastric body.  Micro Data:  BCID 2/26 > coag neg staph 1/2, providencia stuartii >>   BCx2 2/26 >>  BCx2 2/28 >>  UC 2/26 >> greater 100k E-Coli >>           greater 100k providencia stuartii >>           60k enterococcus >>   Antimicrobials:  Zosyn 2/26 >>     Interim history/subjective:  Failed SBT 2/29 am T Max 102.5 WBC 12.1 ( down from 16)  Objective   Blood pressure 106/80, pulse 83, temperature 98.5 F (36.9 C), temperature source Oral, resp. rate 16, height 5\' 7"  (1.702 m), SpO2 100 %.    Vent Mode: PRVC FiO2 (%):  [40 %-100 %] 40 % Set Rate:  [16 bmp] 16 bmp Vt Set:  [520 mL] 520 mL PEEP:  [5 cmH20] 5 cmH20 Pressure Support:  [10 cmH20] 10 cmH20 Plateau Pressure:  [17 cmH20-23 cmH20] 17 cmH20   Intake/Output Summary (Last 24 hours) at 11/08/2018 0933 Last data filed at 11/08/2018 0800 Gross per 24 hour  Intake 1899.42 ml  Output 350 ml  Net 1549.42 ml   There were no vitals filed for this visit.  Examination: General: chronically ill appearing male lying in bed on vent, some spastic upper extremity movement   HENT: trach midline and secure  c/d/i, No LAD, No JVD Lungs: Bialteral excursion, even/non-labored, lungs with rhonchi noted throughout, diminished per bases Cardiovascular: s1s2 regular with ? holosystolic murmur , NSR per tele Abdomen: soft, decreased BS, PEG with some scant drainage noted Extremities: contractures, multiple wounds of varying stages / dressings c/d/i Neuro: Spastic movement upper right upper extremity, opens eyes to call of name  Resolved Hospital Problem list     Assessment & Plan:  Seizure with concern for Status Epilepticus  -known seizure disorder  P: EEG results as noted above Appreciate Neurology assist Continue Keppra, valium  Seizure precautions  Consider adding phenobab per neuro as mom states he has been on phenobarb in past and it controls his seizures  Acute on Chronic Hypoxic Respiratory Failure  Providencia in Sputum -baseline chronic trach  -vomiting 2/28 with concern for aspiration - CXR 2/28>>Bibasilar atelectasis or infiltrates, slight improvement on left.   P: PRVC 8cc/kg  Wean PEEP / FiO2 for sats > 90% SBT trials each am as tolerated CXR prn  CXR in am and  prn Continue abx as above  Pulmonary hygiene as able  Sputum Culture  Concern for Aspiration with Vomiting  KUB negative for illeus P: Aspiration precautions  HOB elevated  NPO for now Irrigate OG Q 8 Heme check OG drainage>> if + consider GI consult Pepcid as SUP Consider trickle feeds  NG tube with 150 cc's of coffee ground drainage noted HGB drop 0.9 grams overnight Plan NPO for now Irrigate OG Q 8 Heme check OG drainage>> if + consider GI consult Pepcid as SUP Trend HGB Monitor for obvious signs of bleeding  E-Coli, Providencia & Enterococcus UTI -baseline frequent UTI's  - WBC down trending P: Trend fever, WBC Continue zosyn  Follow culture to maturity   Hx dCHF, AF -SR on exam 2/28 P: ICU monitoring Hold home lasix, metoprolol   Nausea / Vomiting  Dysphagia  GERD  No ileus per KUB P: NPO  Hold TF for now > May not tolerate bolus feeding  PRN zofran   AKI  P: Trend BMP / urinary output Replace electrolytes as indicated Avoid nephrotoxic agents, ensure adequate renal perfusion  Hx TBI with Quadriplegia  P: Supportive care   Consider goals of care conversation with patient's mother, as quality of life appears to be  minimal.  Best practice:  Diet: NPO  Pain/Anxiety/Delirium protocol (if indicated): n/a  VAP protocol (if indicated): ordered  DVT prophylaxis: lovenox  GI prophylaxis: pepcid IV  Glucose control: n/a  Mobility: bedrest  Code Status: Limited Code  Family Communication: Mother at bedside, updated 2/29  Disposition: ICU   Labs   CBC: Recent Labs  Lab 11/05/18 1338 11/06/18 0320 11/07/18 1652 11/07/18 2023 11/08/18 0613  WBC 9.8 9.4  --  16.7* 12.1*  NEUTROABS 6.6  --   --  15.7*  --   HGB 12.3* 11.0* 11.6* 10.1* 9.3*  HCT 41.6 37.2* 34.0* 33.5* 31.1*  MCV 101.7* 101.1*  --  99.7 100.3*  PLT 200 192  --  206 190    Basic Metabolic Panel: Recent Labs  Lab 11/05/18 1338 11/06/18 0320 11/07/18 1652 11/07/18 2023  11/08/18 0613  NA 145 145 150* 147* 148*  K 5.0 4.5 3.5 3.6 3.6  CL 111 109  --  114* 116*  CO2 26 29  --  25 25  GLUCOSE 148* 107*  --  103* 87  BUN 49* 40*  --  28* 24*  CREATININE 1.31* 1.20  --  0.98 0.95  CALCIUM 8.7* 8.3*  --  8.0* 7.9*   GFR: CrCl cannot be calculated (Unknown ideal weight.). Recent Labs  Lab 11/05/18 1338 11/05/18 1339 11/06/18 0320 11/07/18 2023 11/07/18 2027 11/08/18 0613  WBC 9.8  --  9.4 16.7*  --  12.1*  LATICACIDVEN  --  1.4  --   --  1.8  --     Liver Function Tests: Recent Labs  Lab  11/05/18 1338 11/07/18 2023  AST 24 30  ALT 19 20  ALKPHOS 59 51  BILITOT 0.6 0.3  PROT 7.1 6.3*  ALBUMIN 2.5* 2.4*   No results for input(s): LIPASE, AMYLASE in the last 168 hours. No results for input(s): AMMONIA in the last 168 hours.  ABG    Component Value Date/Time   PHART 7.381 11/07/2018 1652   PCO2ART 45.3 11/07/2018 1652   PO2ART 235.0 (H) 11/07/2018 1652   HCO3 26.3 11/07/2018 1652   TCO2 28 11/07/2018 1652   O2SAT 100.0 11/07/2018 1652     Coagulation Profile: No results for input(s): INR, PROTIME in the last 168 hours.  Cardiac Enzymes: No results for input(s): CKTOTAL, CKMB, CKMBINDEX, TROPONINI in the last 168 hours.  HbA1C: Hgb A1c MFr Bld  Date/Time Value Ref Range Status  04/09/2017 09:10 AM 4.5 (L) 4.8 - 5.6 % Final    Comment:    (NOTE)         Pre-diabetes: 5.7 - 6.4         Diabetes: >6.4         Glycemic control for adults with diabetes: <7.0     CBG: Recent Labs  Lab 11/06/18 1305 11/06/18 1555 11/06/18 2202 11/07/18 0800 11/07/18 1332  GLUCAP 89 69* 121* 80 120*    Review of Systems:   Unable to complete as patient is altered on mechanical ventilation.   Past Medical History  He,  has a past medical history of Atrial fibrillation (HCC), CHF (congestive heart failure) (HCC), Chronic respiratory failure (HCC), Diabetes mellitus without complication (HCC), Diastolic heart failure (HCC), Dysphagia,  GERD (gastroesophageal reflux disease), Quadriplegia (HCC), and Seizures (HCC).   Surgical History    Past Surgical History:  Procedure Laterality Date  . IR GASTROSTOMY TUBE MOD SED  01/11/2017     Social History   reports that he has never smoked. He has never used smokeless tobacco. He reports that he does not drink alcohol or use drugs.   Family History   His family history is not on file.   Allergies Allergies  Allergen Reactions  . Ativan [Lorazepam] Other (See Comments)    unknown  . Azithromycin Other (See Comments)    Blisters all over body  . Ceftriaxone Other (See Comments)    Blisters all over body  . Vancomycin Other (See Comments)    Blisters all over body     Home Medications  Prior to Admission medications   Medication Sig Start Date End Date Taking? Authorizing Provider  acetaminophen (TYLENOL) 325 MG tablet Place 2 tablets (650 mg total) into feeding tube every 6 (six) hours as needed for mild pain (or Fever >/= 101). 04/15/17  Yes Lonia Blood, MD  aspirin 81 MG chewable tablet Place 81 mg into feeding tube daily.   Yes [provider]  chlorhexidine (PERIDEX) 0.12 % solution Use as directed 15 mLs in the mouth or throat 2 (two) times daily. Patient taking differently: Use as directed 5 mLs in the mouth or throat 2 (two) times daily. Every shift 04/15/17  Yes Lonia Blood, MD  diazepam (VALIUM) 2 MG tablet Place 1 tablet (2 mg total) into feeding tube daily. 09/14/18  Yes Rolly Salter, MD  diazepam (VALIUM) 5 MG tablet Take 1 tablet (5 mg total) by mouth at bedtime. 09/14/18  Yes Rolly Salter, MD  famotidine (PEPCID) 20 MG tablet Place 20 mg into feeding tube 2 (two) times daily.   Yes [provider]  glycopyrrolate (ROBINUL) 1 MG tablet Place 1 tablet (1 mg total) into feeding tube 2 (two) times daily as needed (EXCESSIVE SECRETION). Patient taking differently: Place 1 mg into feeding tube every 12 (twelve) hours.  09/14/18  Yes  Rolly Salter, MD  levETIRAcetam (KEPPRA) 100 MG/ML solution Place 10 mLs (1,000 mg total) into feeding tube 2 (two) times daily. Patient taking differently: Place 1,000 mg into feeding tube every 12 (twelve) hours. For seizure 04/15/17  Yes Lonia Blood, MD  Loperamide HCl 1 MG/7.5ML LIQD Take 2 mg by mouth 3 (three) times daily as needed (diarrhea).   Yes [provider]  metoprolol tartrate (LOPRESSOR) 25 MG tablet Place 25 mg into feeding tube every 12 (twelve) hours.    Yes [provider]  Multiple Vitamin (MULTIVITAMIN WITH MINERALS) TABS tablet Place 1 tablet into feeding tube daily. 04/16/17  Yes Lonia Blood, MD  ondansetron (ZOFRAN) 4 MG tablet Place 1 tablet (4 mg total) into feeding tube every 6 (six) hours as needed for nausea. 04/15/17  Yes Lonia Blood, MD  saccharomyces boulardii (FLORASTOR) 250 MG capsule Place 1 capsule (250 mg total) into feeding tube 2 (two) times daily. 04/15/17  Yes Lonia Blood, MD  traMADol (ULTRAM) 50 MG tablet Place 1 tablet (50 mg total) into feeding tube 2 (two) times daily. 09/14/18  Yes Rolly Salter, MD     Critical care time: 63 minutes     Bevelyn Ngo, AGACNP-BC [facility] Pulmonary/Critical Care Medicine Pager # [phone] After 4 pm call [phone] L2/29/2020, 9:33 AM

## 2018-11-08 NOTE — Progress Notes (Signed)
Nutrition Follow-up  DOCUMENTATION CODES:  Not applicable  INTERVENTION:  Once concern of GIB cleared, recommended re initiation of TF:   Vital 1.5 at goal rate of 50 ml/h (1200 ml per day) and Prostat 30 ml BID to provide 2000 kcals, 111 gm protein, 917 ml free water daily.  NUTRITION DIAGNOSIS:  Inadequate oral intake related to inability to eat as evidenced by NPO status.  GOAL:  Patient will meet greater than or equal to 90% of their needs  MONITOR:  TF tolerance, Labs, Skin  REASON FOR ASSESSMENT:  Ventilator Enteral/tube feeding initiation and management  ASSESSMENT:  58 yo male with PMH of DM, quadriplegia, chronic resp failure, trach, PEG, A fib, HF, seizures who was admitted from [facility] hospital with sepsis related to UTI and HCAP.    Yesterday afternoon, pt developed suffered seizure-like event w/ vomiting and subsequently went into respiratory distress. He was placed on ventilator support and transported to ICU.   On RD arrival today, provider outside patient room. She notes TF will be held at this time due to coffee ground output from ngt. He also had drop in hgb.   Given pt is now significantly febrile and back on ventilator support, will adjust TF.   Patient is currently intubated on ventilator support MV: 8.0 L/min Temp (24hrs), Avg:99.8 F (37.7 C), Min:98.4 F (36.9 C), Max:102.5 F (39.2 C) Propofol: None BP: 101/57 (70)  Labs: WBC:16.7->12.1, Na:145->> 148, Albumin: 2.4,  Meds: valium, Prostat 56ml, Probiotic, ivf, h2ra, IV abx  Recent Labs  Lab 11/06/18 0320 11/07/18 1652 11/07/18 2023 11/08/18 0613  NA 145 150* 147* 148*  K 4.5 3.5 3.6 3.6  CL 109  --  114* 116*  CO2 29  --  25 25  BUN 40*  --  28* 24*  CREATININE 1.20  --  0.98 0.95  CALCIUM 8.3*  --  8.0* 7.9*  GLUCOSE 107*  --  103* 87   Diet Order:   Diet Order            Diet NPO time specified  Diet effective now             EDUCATION NEEDS:  No education needs have been  identified at this time  Skin:  MASD to perineum, Skin tears to both knees, unspecified wounds to L ankle, L toe, L leg  Last BM:  2/28  Height:  Ht Readings from Last 1 Encounters:  11/06/18 5\' 7"  (1.702 m)   Weight:  Wt Readings from Last 1 Encounters:  11/08/18 69.8 kg   Ideal Body Weight:  60.5 kg(-10% d/t quad)  BMI:  Body mass index is 24.1 kg/m.  Estimated Nutritional Needs:  Kcal:  2000 kcals (psu 2003 b) Protein:  105-120g Pro (1.5-1.7g/kg bw) Fluid:  Per MD assessment  Christophe Louis RD, LDN, CNSC Clinical Nutrition Available Tues-Sat via Pager: [phone] 11/08/2018 12:57 PM

## 2018-11-09 ENCOUNTER — Inpatient Hospital Stay (HOSPITAL_COMMUNITY): Payer: Medicare Other

## 2018-11-09 DIAGNOSIS — J9620 Acute and chronic respiratory failure, unspecified whether with hypoxia or hypercapnia: Secondary | ICD-10-CM

## 2018-11-09 LAB — CBC
HCT: 30.6 % — ABNORMAL LOW (ref 39.0–52.0)
Hemoglobin: 9.6 g/dL — ABNORMAL LOW (ref 13.0–17.0)
MCH: 31 pg (ref 26.0–34.0)
MCHC: 31.4 g/dL (ref 30.0–36.0)
MCV: 98.7 fL (ref 80.0–100.0)
Platelets: 178 10*3/uL (ref 150–400)
RBC: 3.1 MIL/uL — ABNORMAL LOW (ref 4.22–5.81)
RDW: 13.8 % (ref 11.5–15.5)
WBC: 8.6 10*3/uL (ref 4.0–10.5)
nRBC: 0 % (ref 0.0–0.2)

## 2018-11-09 LAB — URINE CULTURE: Culture: >=100000 — AB

## 2018-11-09 LAB — MAGNESIUM: Magnesium: 2.1 mg/dL (ref 1.7–2.4)

## 2018-11-09 LAB — BASIC METABOLIC PANEL
Anion gap: 10 (ref 5–15)
BUN: 21 mg/dL — AB (ref 6–20)
CO2: 20 mmol/L — ABNORMAL LOW (ref 22–32)
Calcium: 7.9 mg/dL — ABNORMAL LOW (ref 8.9–10.3)
Chloride: 118 mmol/L — ABNORMAL HIGH (ref 98–111)
Creatinine, Ser: 0.9 mg/dL (ref 0.61–1.24)
GFR calc Af Amer: >60 mL/min (ref >60)
GFR calc non Af Amer: >60 mL/min (ref >60)
Glucose, Bld: 79 mg/dL (ref 70–99)
POTASSIUM: 3.5 mmol/L (ref 3.5–5.1)
Sodium: 148 mmol/L — ABNORMAL HIGH (ref 135–145)

## 2018-11-09 MED ORDER — FUROSEMIDE 10 MG/ML IJ SOLN
20.0000 mg | Freq: Once | INTRAMUSCULAR | Status: AC
  Administered 2018-11-09: 20 mg via INTRAVENOUS
  Filled 2018-11-09: qty 2

## 2018-11-09 NOTE — Progress Notes (Signed)
RT NOTE:  Pt's RR went above 50 and pt desaturated during wean. Placed pt back on previous vent settings. RT will continue to monitor.

## 2018-11-09 NOTE — Progress Notes (Signed)
SAME DAY NOTE  Prelim EEG review negative for seizure activity or gross electrographic abnormality. Follow formal report and call back neurology if any concerns. No new recs. Neurology will sign off for now and will be available as needed.  -- Milon Dikes, MD [facility] Pager: [phone] If 7pm to 7am, please call on call as listed on AMION.

## 2018-11-09 NOTE — Progress Notes (Signed)
NAME:  Dean Graham, MRN:  [id number], DOB:  06-18-1961, LOS: 4 ADMISSION DATE:  11/05/2018, CONSULTATION DATE:  2/28 REFERRING MD:  Dr. Margo AyeHall, CHIEF COMPLAINT:  Respiratory Distress    Brief History    58 y/o M, [facility] Resident, with TBI / quadriplegia chronic trach [facility]26 with concerns for vomiting and aspiration.   Initial work up included a CXR with chronic bibasilar atelectasis, Na 145, K 5, cl 111, CO2 26, glucose 148, BUN 49, sr cr 1.31, BNP 115, lactic acid 1.4, WBC 9.4, Hgb 11, MCV 101 and platelets 192.  Cultures obtained show providencia in the blood.  UC growing providencia, e-coli & enterococcus.  On 2/28 he suffered what appeared to be a seizure & subsequent vomiting with respiratory distress, pt transferred to ICU.Concern of aspiration   Past Medical History  Quadriplegia  GERD Dysphagia  dCHF  DM  Chronic Respiratory Failure  CHF  AF  Seizures   Significant Hospital Events   2/26 Admit from [facility]  2/28 Concern for vomiting / aspiration, tx to ICU  2/29 - Failed SBT 2/29 am T Max 102.5 WBC 12.1 ( down from 1  Consults:  PCCM   Procedures:  Intubation 2/28>>  Significant Diagnostic Tests:  EEG 11/07/2018 This is an abnormal electroencephalogram secondary to a breach rhythm and left frontotemporal sharp transients which are consistent with the patient's history.   No evidence of electrographic seizure activity is noted   2/28 KUB Nonobstructive bowel gas pattern. Enteric tube tip projects over gastric body.  Micro Data:  BCID 2/26 > coag neg staph 1/2, providencia stuartii >>   BCx2 2/26 >>  BCx2 2/28 >>  UC 2/26 >> greater 100k E-Coli >>           greater 100k providencia stuartii >>           60k enterococcus >>   Antimicrobials:  Zosyn 2/26 >>   Interim history/subjective:   3/1 - EEG ordered for nustagmus per neuro. No seizure overnight. Per neuro other than "prominent nystagmus" improving mental status and getting closer  to baseline. FAiled SBT due to RR 50 and desaturated. CXR worsening bilateral LL atelectasis v effusion v both (personally visualized)  Objective   Blood pressure 134/84, pulse 87, temperature 98.1 F (36.7 C), temperature source Oral, resp. rate 17, height 5\' 7"  (1.702 m), weight 69.8 kg, SpO2 100 %.    Vent Mode: PRVC FiO2 (%):  [40 %] 40 % Set Rate:  [16 bmp] 16 bmp Vt Set:  [520 mL] 520 mL PEEP:  [5 cmH20] 5 cmH20 Pressure Support:  [10 cmH20] 10 cmH20 Plateau Pressure:  [17 cmH20-23 cmH20] 17 cmH20   Intake/Output Summary (Last 24 hours) at 11/09/2018 1222 Last data filed at 11/09/2018 1100 Gross per 24 hour  Intake 3694.26 ml  Output 1500 ml  Net 2194.26 ml   Filed Weights   11/08/18 1256  Weight: 69.8 kg    General Appearance:  Looks chronic ill in CONTRACTURES Head:  Normocephalic, without obvious abnormality, atraumatic Eyes:  PERRL - yes, conjunctiva/corneas - muddy     Ears:  Normal external ear canals, both ears Nose:  G tube - yes Throat:  ETT TUBE - no , OG tube - no. TRACH + Neck:  Supple,  No enlargement/tenderness/nodules Lungs: Clear to auscultation bilaterally, Ventilator   Synchrony - yes Heart:  S1 and S2 normal, no murmur, CVP - no.  Pressors - no Abdomen:  Soft, no masses, no  organomegaly Genitalia / Rectal:  Not done Extremities:  Extremities- CONTRATURES. RUE PICC + Skin:  ntact in exposed areas . Sacral area - not examind Neurologic:  Sedation - none -> RASS - 0 . Moves all 4s - yes but contracted. CAM-ICU - uanble to test . Orientation - occ follows commands per RN      LABS    PULMONARY Recent Labs  Lab 11/07/18 1652  PHART 7.381  PCO2ART 45.3  PO2ART 235.0*  HCO3 26.3  TCO2 28  O2SAT 100.0    CBC Recent Labs  Lab 11/07/18 2023 11/08/18 0613 11/09/18 0522  HGB 10.1* 9.3* 9.6*  HCT 33.5* 31.1* 30.6*  WBC 16.7* 12.1* 8.6  PLT 206 190 178    COAGULATION No results for input(s): INR in the last 168 hours.  CARDIAC  No  results for input(s): TROPONINI in the last 168 hours. No results for input(s): PROBNP in the last 168 hours.   CHEMISTRY Recent Labs  Lab 11/05/18 1338 11/06/18 0320 11/07/18 1652 11/07/18 2023 11/08/18 0613 11/09/18 0522  NA 145 145 150* 147* 148* 148*  K 5.0 4.5 3.5 3.6 3.6 3.5  CL 111 109  --  114* 116* 118*  CO2 26 29  --  25 25 20*  GLUCOSE 148* 107*  --  103* 87 79  BUN 49* 40*  --  28* 24* 21*  CREATININE 1.31* 1.20  --  0.98 0.95 0.90  CALCIUM 8.7* 8.3*  --  8.0* 7.9* 7.9*  MG  --   --   --   --   --  2.1   Estimated Creatinine Clearance: 82.6 mL/min (by C-G formula based on SCr of 0.9 mg/dL).   LIVER Recent Labs  Lab 11/05/18 1338 11/07/18 2023  AST 24 30  ALT 19 20  ALKPHOS 59 51  BILITOT 0.6 0.3  PROT 7.1 6.3*  ALBUMIN 2.5* 2.4*     INFECTIOUS Recent Labs  Lab 11/05/18 1339 11/07/18 2027  LATICACIDVEN 1.4 1.8     ENDOCRINE CBG (last 3)  Recent Labs    11/06/18 2202 11/07/18 0800 11/07/18 1332  GLUCAP 121* 80 120*         IMAGING x48h  - image(s) personally visualized  -   highlighted in bold Ct Head Wo Contrast  Result Date: 11/08/2018 CLINICAL DATA:  Altered level of consciousness. Seizure like activity with vomiting and respiratory distress. EXAM: CT HEAD WITHOUT CONTRAST TECHNIQUE: Contiguous axial images were obtained from the base of the skull through the vertex without intravenous contrast. COMPARISON:  01/04/2017 FINDINGS: Brain: A disconnected right temporo-occipital approach shunt catheter terminates in the right sylvian fissure, unchanged. Encephalomalacia is again seen anteriorly in the right greater than left frontal lobes and in the right temporoparietal region and may be related to remote trauma. There is chronic severe brainstem and cerebellar atrophy. Moderate lateral and third ventriculomegaly is unchanged and may reflect central predominant cerebral atrophy versus communicating hydrocephalus. There is no evidence of  acute infarct, intracranial hemorrhage, mass, midline shift, or extra-axial fluid collection. Vascular: No hyperdense vessel. Skull: Prior right-sided craniotomy changes. Sinuses/Orbits: Moderate mucosal thickening and small volume bubbly fluid/secretions in the left maxillary sinus with milder changes in the right maxillary sinus. Large left and moderate right mastoid effusions. Left middle ear opacification. Cerumen or other material in the right external auditory canal. Dysconjugate gaze. Other: Nasoenteric tube partially visualized. IMPRESSION: 1. No evidence of acute intracranial abnormality. 2. Stable chronic changes including ventriculomegaly, presumed posttraumatic encephalomalacia, and  severe cerebellar and brainstem atrophy. 3. Moderate mucosal thickening and small volume fluid in the paranasal sinuses. Left larger than right mastoid effusions and left middle ear effusion. Electronically Signed   By: Sebastian Ache M.D.   On: 11/08/2018 14:14   Dg Chest Port 1 View  Result Date: 11/09/2018 CLINICAL DATA:  Post intubation. EXAM: PORTABLE CHEST 1 VIEW COMPARISON:  11/07/2018; 09/13/2018 FINDINGS: Grossly unchanged cardiac silhouette and mediastinal contours. Stable positioning of support apparatus. No pneumothorax. Pulmonary vasculature appears less distinct with suspected increase in small to moderate size layering bilateral effusions with associated worsening bibasilar heterogeneous/consolidative opacities, right greater than left. Cluster granuloma within the right mid lung are unchanged. No acute osseous abnormalities. IMPRESSION: 1.  Stable positioning of support apparatus.  No pneumothorax. 2. Suspected worsening pulmonary edema with interval increase in bilateral small to moderate sized bilateral effusions and associated worsening bibasilar opacities, atelectasis versus infiltrate. Electronically Signed   By: Simonne Come M.D.   On: 11/09/2018 07:24   Dg Chest Port 1 View  Result Date:  11/07/2018 CLINICAL DATA:  Hypoxia EXAM: PORTABLE CHEST 1 VIEW COMPARISON:  11/05/2018 FINDINGS: Tracheostomy remains in place, unchanged. Interval placement of right PICC line with the tip at the cavoatrial junction. Bibasilar atelectasis or infiltrates, similar to prior study on the right, slightly improved on the left. Mild cardiomegaly. IMPRESSION: Right PICC line is been placed with the tip at the cavoatrial junction. Bibasilar atelectasis or infiltrates, slight improvement on the left. Electronically Signed   By: Charlett Nose M.D.   On: 11/07/2018 15:39   Dg Abd Portable 1v  Result Date: 11/07/2018 CLINICAL DATA:  58 y/o  M; constipation. EXAM: PORTABLE ABDOMEN - 1 VIEW COMPARISON:  None. FINDINGS: Peg tube noted. Enteric tube tip projects over gastric body. Nonobstructive bowel gas pattern. No pneumoperitoneum. No acute osseous abnormality is evident. IMPRESSION: Nonobstructive bowel gas pattern. Enteric tube tip projects over gastric body. Electronically Signed   By: Mitzi Hansen M.D.   On: 11/07/2018 15:53     Resolved Hospital Problem list     Assessment & Plan:    Seizure with concern for Status Epilepticus  -known seizure disorder   11/09/2018 - neuro feels improving but wants to do eEG for nystagmus  P: Per neuro  Acute on Chronic Hypoxic Respiratory Failure  - s/p chronic baseline trach Providencia in Sputum -baseline chronic trach   11/09/2018 -needing PRVC (unclear baseline at [facility])   P: PRVC 8cc/kg  Wean PEEP / FiO2 for sats > 90%   Concern for Aspiration with Vomiting  KUB negative for illeus  P: Aspiration precautions  HOB elevated  Start trickle feeds pepcid   NG tube with 150 cc's of coffee ground drainage noted 2/28   3/1 - no overt bleeding  Plan Start trickle feeds   E-Coli, Providencia & Enterococcus UTI -baseline frequent UTI's  - WBC down trending  P: Trend fever, WBC Continue zosyn  Follow culture to maturity   Hx  dCHF, AF -SR on exam 2/28 P: ICU monitoring Hold home lasix, metoprolol   Nausea / Vomiting  Dysphagia  GERD  No ileus per KUB P: Challenge with trickle feeds PRN zofran   AKI    resolved 11/07/2018  P: Trend BMP / urinary output Replace electrolytes as indicated Avoid nephrotoxic agents, ensure adequate renal perfusion  Hx TBI with Quadriplegia  P: Supportive care     Best practice:  Diet: NPO  Pain/Anxiety/Delirium protocol (if indicated): n/a  VAP protocol (if  indicated): ordered  DVT prophylaxis: lovenox  GI prophylaxis: pepcid IV  Glucose control: n/a  Mobility: bedrest  Code Status: Limited Code  Family Communication: Mother at bedside, updated 2/29  Disposition: ICU. Need to figure out when to go back to [facility]. LTAC consult requested    ATTESTATION & SIGNATURE   The patient Divon Krabill is critically ill with multiple organ systems failure and requires high complexity decision making for assessment and support, frequent evaluation and titration of therapies, application of advanced monitoring technologies and extensive interpretation of multiple databases.   Critical Care Time devoted to patient care services described in this note is  30  Minutes. This time reflects time of care of this signee Dr Kalman Shan. This critical care time does not reflect procedure time, or teaching time or supervisory time of PA/NP/Med student/Med Resident etc but could involve care discussion time     Dr. Kalman Shan, M.D., Scnetx.C.P Pulmonary and Critical Care Medicine Staff Physician  System [place] [facility] Pager: [phone], If no answer or between  15:00h - 7:00h: call [phone]  11/09/2018 12:23 PM

## 2018-11-09 NOTE — Progress Notes (Addendum)
Neurology Progress Note   S:// Patient in bed, trached, on the vent, alert. Patient able to start weaning front vent today. Able to follow commands. EEG ordered for nystagmus that is presumably not present at baseline. No seizure activity noted overnight.  O:// Current vital signs: BP (!) 121/91   Pulse (!) 55   Temp 98.7 F (37.1 C) (Axillary)   Resp (!) 21   Ht '5\' 7"'$  (1.702 m)   Wt 69.8 kg Comment: Bed wt  SpO2 100%   BMI 24.10 kg/m  Vital signs in last 24 hours: Temp:  [98.4 F (36.9 C)-98.7 F (37.1 C)] 98.7 F (37.1 C) (03/01 0400) Pulse Rate:  [46-101] 55 (03/01 0800) Resp:  [15-21] 21 (03/01 0800) BP: (84-142)/(48-97) 121/91 (03/01 0800) SpO2:  [99 %-100 %] 100 % (03/01 0800) Arterial Line BP: (86-152)/(57-86) 124/65 (03/01 0800) FiO2 (%):  [40 %] 40 % (03/01 0740) Weight:  [69.8 kg] 69.8 kg (02/29 1256) General: Patient has a tracheostomy, attached ventilator, no apparent distress HEENT: There is evidence of old crani scars on head CVS: S1-S2 heard, regular rhythm Respiration: Chest clear to auscultation Extremities: Left arm and leg are contracted, no edema in any of the 4 extremities. Neurological exam Eyes open to command. Nystagmus noted. Opens eyes to voice. Nods yes to some questions and attempted to say hi with his mouth without producing any sound. Pupils are equal round reactive light,  disconjugate gaze. Breathing over the ventilator Right upper and lower extremity mildly increased tone but left upper and lower extremity are spastic. Grimaces to noxious stimulation but no frank withdrawal.  Medications  Current Facility-Administered Medications:  .  0.9 %  sodium chloride infusion, , Intravenous, Continuous, Dessa Phi, DO, Last Rate: 75 mL/hr at 11/09/18 0800 .  0.9 %  sodium chloride infusion, , Intravenous, PRN, Alfredo Martinez, Brandi L, NP, Stopped at 11/09/18 0210 .  acetaminophen (TYLENOL) tablet 650 mg, 650 mg, Per Tube, Q6H PRN **OR**  acetaminophen (TYLENOL) suppository 650 mg, 650 mg, Rectal, Q6H PRN, Dessa Phi, DO, 650 mg at 11/07/18 1743 .  aspirin chewable tablet 81 mg, 81 mg, Per Tube, Daily, Dessa Phi, DO, 81 mg at 11/08/18 1033 .  chlorhexidine gluconate (MEDLINE KIT) (PERIDEX) 0.12 % solution 15 mL, 15 mL, Mouth Rinse, BID, Ollis, Brandi L, NP, 15 mL at 11/09/18 0728 .  diazepam (VALIUM) tablet 2 mg, 2 mg, Per Tube, Daily, Dessa Phi, DO, 2 mg at 11/08/18 1033 .  diazepam (VALIUM) tablet 5 mg, 5 mg, Oral, QHS, Dessa Phi, DO, 5 mg at 11/08/18 2128 .  enoxaparin (LOVENOX) injection 40 mg, 40 mg, Subcutaneous, Q24H, Dessa Phi, DO, 40 mg at 11/08/18 2128 .  famotidine (PEPCID) tablet 20 mg, 20 mg, Per Tube, BID, Dessa Phi, DO, 20 mg at 11/08/18 2128 .  feeding supplement (PRO-STAT SUGAR FREE 64) liquid 30 mL, 30 mL, Per Tube, Daily, Hall, Carole N, DO, 30 mL at 11/08/18 1032 .  glycopyrrolate (ROBINUL) tablet 1 mg, 1 mg, Per Tube, BID PRN, Dessa Phi, DO .  lacosamide (VIMPAT) 100 mg in sodium chloride 0.9 % 25 mL IVPB, 100 mg, Intravenous, Q12H, Rogue Jury, MD, Stopped at 11/08/18 2302 .  levETIRAcetam (KEPPRA) IVPB 1500 mg/ 100 mL premix, 1,500 mg, Intravenous, Q12H, Kayleen Memos, DO, Stopped at 11/09/18 0741 .  loperamide HCl (IMODIUM) 1 MG/7.5ML suspension 2 mg, 2 mg, Per Tube, TID PRN, Dessa Phi, DO .  MEDLINE mouth rinse, 15 mL, Mouth Rinse, 10 times per day, Alfredo Martinez,  Brandi L, NP, 15 mL at 11/09/18 0550 .  metoprolol tartrate (LOPRESSOR) tablet 25 mg, 25 mg, Per Tube, Q12H, Dessa Phi, DO, Stopped at 11/08/18 1034 .  ondansetron (ZOFRAN) tablet 4 mg, 4 mg, Per Tube, Q6H PRN **OR** ondansetron (ZOFRAN) injection 4 mg, 4 mg, Intravenous, Q6H PRN, Dessa Phi, DO, 4 mg at 11/07/18 1450 .  piperacillin-tazobactam (ZOSYN) IVPB 3.375 g, 3.375 g, Intravenous, Q8H, Kayleen Memos, DO, Stopped at 11/09/18 3016 .  polyethylene glycol (MIRALAX / GLYCOLAX) packet 17 g, 17 g, Per  Tube, Daily PRN, Dessa Phi, DO .  saccharomyces boulardii (FLORASTOR) capsule 250 mg, 250 mg, Per Tube, BID, Dessa Phi, DO, 250 mg at 11/08/18 2128 .  sodium chloride flush (NS) 0.9 % injection 10-40 mL, 10-40 mL, Intracatheter, Q12H, Dessa Phi, DO, 20 mL at 11/09/18 0002 .  sodium chloride flush (NS) 0.9 % injection 10-40 mL, 10-40 mL, Intracatheter, PRN, Dessa Phi, DO, 10 mL at 11/07/18 0504 Labs CBC    Component Value Date/Time   WBC 8.6 11/09/2018 0522   RBC 3.10 (L) 11/09/2018 0522   HGB 9.6 (L) 11/09/2018 0522   HCT 30.6 (L) 11/09/2018 0522   PLT 178 11/09/2018 0522   MCV 98.7 11/09/2018 0522   MCH 31.0 11/09/2018 0522   MCHC 31.4 11/09/2018 0522   RDW 13.8 11/09/2018 0522   LYMPHSABS 0.5 (L) 11/07/2018 2023   MONOABS 0.5 11/07/2018 2023   EOSABS 0.0 11/07/2018 2023   BASOSABS 0.0 11/07/2018 2023    CMP     Component Value Date/Time   NA 148 (H) 11/09/2018 0522   K 3.5 11/09/2018 0522   CL 118 (H) 11/09/2018 0522   CO2 20 (L) 11/09/2018 0522   GLUCOSE 79 11/09/2018 0522   BUN 21 (H) 11/09/2018 0522   CREATININE 0.90 11/09/2018 0522   CALCIUM 7.9 (L) 11/09/2018 0522   PROT 6.3 (L) 11/07/2018 2023   ALBUMIN 2.4 (L) 11/07/2018 2023   AST 30 11/07/2018 2023   ALT 20 11/07/2018 2023   ALKPHOS 51 11/07/2018 2023   BILITOT 0.3 11/07/2018 2023   GFRNONAA >60 11/09/2018 0522   GFRAA >60 11/09/2018 0522    Imaging No brain imaging to review.   Assessment: Mr. Legate is a 57-year-he has old man with a past medical history of TBI at the age of 15 and residual quadriplegia, was admitted for fevers and found to have a UTI with positive urine culture. He also has a history of seizure disorder-was on Keppra and Vimpat at [facility], but was only on Keppra during the hospitalization. He has a seizure-like activity followed by an aspiration event of vomiting and for which he was brought to the ICU for further management. Critical CARE note mentions witnessing  right-sided tonic-clonic rhythmic movement of the contracted arm which resolved after 4 mg of Ativan.  Stat EEG negative for seizures.  Today's exam shows him following some commands and nearing his baseline but has prominent nystagmus.  Impression: -Breakthrough seizure in the setting of lowered threshold due to infection versus due to noncompliance with antiepileptic -Status epilepticus-resolved -Toxic/metabolic encephalopathy  Recommendations: Continue Keppra and Vimpat Continue seizure precautions Treatment of infection per primary team as you are Routine EEG today.  If remains negative for any seizure activity, no new recommendations at that point.  Will obtain and addend this note after EEG is done.   -- Amie Portland, MD [facility] Pager: [phone] If 7pm to 7am, please call on call as listed on AMION.  ADDENDUM EEG - sharp transients but no sz. No new rec

## 2018-11-09 NOTE — Progress Notes (Signed)
EEG Completed; Results Pending  

## 2018-11-09 NOTE — Procedures (Signed)
ELECTROENCEPHALOGRAM REPORT   Patient: Dean Graham       Room #: 1O84Z EEG No. ID: [id number] Age: 58 y.o.        Sex: male Referring Physician: Icard Report Date:  11/09/2018        Interpreting Physician: Thana Farr  History: Berkay Pignone is an 58 y.o. male with seizure-like activity  Medications:  ASA, Valium, Pepcid, Vimpat, Lopressor, Zosyn  Conditions of Recording:  This is a 21 channel routine scalp EEG performed with bipolar and monopolar montages arranged in accordance to the international 10/20 system of electrode placement. One channel was dedicated to EKG recording.  The patient is in the poorly responsive state.  Description:  The background rhythm consists of poorly organized, low voltage generalized delta slowing intermixed with fast beta activity which resembles sleep activity.  A breach rhythm is observed in the frontocentral leads, most particularly over the right hemisphere.  Intermittent left frontotemporal spike and slow wave activity is noted in the left frontotemporal region with phase reversal at F7. No evidence of electrographic seizure activity is noted.  Hyperventilation and intermittent photic stimulation were not performed  IMPRESSION: This is an abnormal electroencephalogram secondary to a breach rhythm and left frontotemporal sharp transients (phase reversal at [facility]) which are consistent with the patient's history.  No evidence of electrographic seizure activity is noted    Thana Farr, MD Neurology [phone] 11/09/2018, 9:15 PM

## 2018-11-10 DIAGNOSIS — J9601 Acute respiratory failure with hypoxia: Secondary | ICD-10-CM

## 2018-11-10 DIAGNOSIS — R569 Unspecified convulsions: Secondary | ICD-10-CM

## 2018-11-10 LAB — BASIC METABOLIC PANEL
Anion gap: 10 (ref 5–15)
BUN: 14 mg/dL (ref 6–20)
CO2: 20 mmol/L — ABNORMAL LOW (ref 22–32)
CREATININE: 0.9 mg/dL (ref 0.61–1.24)
Calcium: 8 mg/dL — ABNORMAL LOW (ref 8.9–10.3)
Chloride: 113 mmol/L — ABNORMAL HIGH (ref 98–111)
GFR calc Af Amer: >60 mL/min (ref >60)
GFR calc non Af Amer: >60 mL/min (ref >60)
Glucose, Bld: 74 mg/dL (ref 70–99)
Potassium: 2.8 mmol/L — ABNORMAL LOW (ref 3.5–5.1)
Sodium: 143 mmol/L (ref 135–145)

## 2018-11-10 LAB — CBC WITH DIFFERENTIAL/PLATELET
Abs Immature Granulocytes: 0.02 10*3/uL (ref 0.00–0.07)
Basophils Absolute: 0 10*3/uL (ref 0.0–0.1)
Basophils Relative: 0 %
EOS ABS: 0.2 10*3/uL (ref 0.0–0.5)
EOS PCT: 2 %
HCT: 31.8 % — ABNORMAL LOW (ref 39.0–52.0)
Hemoglobin: 9.9 g/dL — ABNORMAL LOW (ref 13.0–17.0)
Immature Granulocytes: 0 %
Lymphocytes Relative: 18 %
Lymphs Abs: 1.3 10*3/uL (ref 0.7–4.0)
MCH: 30.5 pg (ref 26.0–34.0)
MCHC: 31.1 g/dL (ref 30.0–36.0)
MCV: 97.8 fL (ref 80.0–100.0)
MONO ABS: 0.5 10*3/uL (ref 0.1–1.0)
Monocytes Relative: 7 %
Neutro Abs: 5.1 10*3/uL (ref 1.7–7.7)
Neutrophils Relative %: 73 %
Platelets: 207 10*3/uL (ref 150–400)
RBC: 3.25 MIL/uL — ABNORMAL LOW (ref 4.22–5.81)
RDW: 13.8 % (ref 11.5–15.5)
WBC: 7 10*3/uL (ref 4.0–10.5)
nRBC: 0 % (ref 0.0–0.2)

## 2018-11-10 LAB — CULTURE, RESPIRATORY

## 2018-11-10 LAB — CULTURE, RESPIRATORY W GRAM STAIN
Culture: NORMAL
Special Requests: NORMAL

## 2018-11-10 LAB — CULTURE, BLOOD (ROUTINE X 2)
Culture: NO GROWTH
Special Requests: ADEQUATE

## 2018-11-10 LAB — MAGNESIUM: Magnesium: 1.8 mg/dL (ref 1.7–2.4)

## 2018-11-10 LAB — PHOSPHORUS: Phosphorus: 3.2 mg/dL (ref 2.5–4.6)

## 2018-11-10 LAB — MRSA PCR SCREENING: MRSA by PCR: NEGATIVE

## 2018-11-10 MED ORDER — VITAL AF 1.2 CAL PO LIQD
1000.0000 mL | ORAL | Status: DC
  Administered 2018-11-10: 1000 mL

## 2018-11-10 MED ORDER — POTASSIUM CHLORIDE 20 MEQ/15ML (10%) PO SOLN
40.0000 meq | Freq: Three times a day (TID) | ORAL | Status: AC
  Administered 2018-11-10 (×3): 40 meq
  Filled 2018-11-10 (×3): qty 30

## 2018-11-10 MED ORDER — FUROSEMIDE 10 MG/ML IJ SOLN
20.0000 mg | Freq: Once | INTRAMUSCULAR | Status: AC
  Administered 2018-11-10: 20 mg via INTRAVENOUS
  Filled 2018-11-10: qty 2

## 2018-11-10 NOTE — Progress Notes (Signed)
Reason for consult: Seizure  Subjective: Patient is awake and following commands.  Has had no further clinical seizures   ROS: negative except above  Examination  Vital signs in last 24 hours: Temp:  [98.1 F (36.7 C)-99.1 F (37.3 C)] 99.1 F (37.3 C) (03/02 0400) Pulse Rate:  [47-98] 78 (03/02 0600) Resp:  [15-29] 17 (03/02 0600) BP: (116-153)/(72-111) 132/91 (03/02 0600) SpO2:  [97 %-100 %] 100 % (03/02 0600) Arterial Line BP: (88-146)/(53-74) 146/73 (03/02 0600) FiO2 (%):  [40 %] 40 % (03/02 0349)  General: lying in bed CVS: pulse-normal rate and rhythm RS: breathing comfortably Extremities: normal   Neuro: MS: Alert, follows commands, mouthss answers CN: pupils equal and reactive, disconjugate gaze Motor: Spastic left greater than right in both upper and lower extremities Coordination: unable to assess Gait: not tested  Basic Metabolic Panel: Recent Labs  Lab 11/06/18 0320 11/07/18 1652 11/07/18 2023 11/08/18 0613 11/09/18 0522 11/10/18 0000 11/10/18 0506  NA 145 150* 147* 148* 148* 143  --   K 4.5 3.5 3.6 3.6 3.5 2.8*  --   CL 109  --  114* 116* 118* 113*  --   CO2 29  --  25 25 20* 20*  --   GLUCOSE 107*  --  103* 87 79 74  --   BUN 40*  --  28* 24* 21* 14  --   CREATININE 1.20  --  0.98 0.95 0.90 0.90  --   CALCIUM 8.3*  --  8.0* 7.9* 7.9* 8.0*  --   MG  --   --   --   --  2.1  --  1.8  PHOS  --   --   --   --   --   --  3.2    CBC: Recent Labs  Lab 11/05/18 1338 11/06/18 0320 11/07/18 1652 11/07/18 2023 11/08/18 0613 11/09/18 0522 11/10/18 0506  WBC 9.8 9.4  --  16.7* 12.1* 8.6 7.0  NEUTROABS 6.6  --   --  15.7*  --   --  5.1  HGB 12.3* 11.0* 11.6* 10.1* 9.3* 9.6* 9.9*  HCT 41.6 37.2* 34.0* 33.5* 31.1* 30.6* 31.8*  MCV 101.7* 101.1*  --  99.7 100.3* 98.7 97.8  PLT 200 192  --  206 190 178 207     Coagulation Studies: No results for input(s): LABPROT, INR in the last 72 hours.  EEG: Reviewed results  Interpretation: Abnormal  electroencephalogram secondary to a breach rhythm and left frontotemporal sharp transients (phase reversal at [facility]) which are consistent with the patient's history. No evidence of electrographic seizure activity is noted   ASSESSMENT AND PLAN  58 year old male with past medical history of TBI, quadriplegia and seizure disorder on Keppra and Vimpat presented with seizure-like activity followed by aspiration event.  Seizure-like activity resolved after 4 mg of Ativan.  Patient had missed his Vimpat dose while in the hospital.  Also noted to have urinary tract infection.  EEG was performed yesterday negative for any further seizure.  Impression Breakthrough seizure activity in the setting of infection and missed dose of antiepileptic Status epilepticus - resolved Toxic metabolic encephalopathy  Recommendations Continue Vimpat and Keppra Seizure precautions Avoid antibiotics that can reduce seizure threshold  Thanks for the consult.  Please call back with any questions.  Georgiana Spinner Jarred Purtee [facility] Pager Number [phone] For questions after 7pm please refer to AMION to reach the Neurologist on call

## 2018-11-10 NOTE — Progress Notes (Signed)
NAME:  Dean Graham, MRN:  [id number], DOB:  [birth year], LOS: 5 ADMISSION DATE:  11/05/2018, CONSULTATION DATE:  2/28 REFERRING MD:  Dr. Margo Aye, CHIEF COMPLAINT:  Respiratory Distress    Brief History    58 y/o M, [facility], with TBI / quadriplegia chronic trach admitted to [facility] on 2/26 with concerns for vomiting and aspiration.   Initial work up included a CXR with chronic bibasilar atelectasis, Na 145, K 5, cl 111, CO2 26, glucose 148, BUN 49, sr cr 1.31, BNP 115, lactic acid 1.4, WBC 9.4, Hgb 11, MCV 101 and platelets 192.  Cultures obtained show providencia in the blood.  UC growing providencia, e-coli & enterococcus.  On 2/28 he suffered what appeared to be a seizure & subsequent vomiting with respiratory distress, pt transferred to ICU.Concern of aspiration   Past Medical History  Quadriplegia  GERD Dysphagia  dCHF  DM  Chronic Respiratory Failure  CHF  AF  Seizures   Significant Hospital Events   2/26 Admit from [facility]  2/28 Concern for vomiting / aspiration, tx to ICU  2/29 - Failed SBT 2/29 am T Max 102.5 3/2>> failed SBT - apneic   Consults:  PCCM   Procedures:  Intubation 2/28>>  Significant Diagnostic Tests:  EEG 11/07/2018>>This is an abnormal electroencephalogram secondary to a breach rhythm and left frontotemporal sharp transients which are consistent with the patient's history.   No evidence of electrographic seizure activity is noted   2/28 KUB Nonobstructive bowel gas pattern. Enteric tube tip projects over gastric body. 3/2 EEG>>> neg seizure   Micro Data:  BCID 2/26 > coag neg staph 1/2, providencia stuartii >>   BCx2 2/26 >>  BCx2 2/28 >>  UC 2/26 >> greater 100k E-Coli >> ESBL>>>          greater 100k providencia stuartii >>           60k enterococcus >>  Sputum 2/29>>> normal flora   Antimicrobials:  Zosyn 2/26 >>   Interim history/subjective:  No sig change overnight  Failed sbt r/t apnea    Objective   Blood pressure (!)  158/96, pulse (!) 50, temperature 99 F (37.2 C), temperature source Axillary, resp. rate 16, height 5\' 7"  (1.702 m), weight 69.8 kg, SpO2 100 %.    Vent Mode: PRVC FiO2 (%):  [40 %] 40 % Set Rate:  [16 bmp] 16 bmp Vt Set:  [520 mL] 520 mL PEEP:  [5 cmH20] 5 cmH20 Pressure Support:  [10 cmH20] 10 cmH20 Plateau Pressure:  [17 cmH20-23 cmH20] 17 cmH20   Intake/Output Summary (Last 24 hours) at 11/10/2018 1056 Last data filed at 11/10/2018 1000 Gross per 24 hour  Intake 1857.04 ml  Output 2120 ml  Net -262.96 ml   Filed Weights   11/08/18 1256  Weight: 69.8 kg      General:  Chronically ill appearing male, NAD on vent  HEENT: MM pink/moist, trach site c/d  Neuro: per RN occasionally follows commands, did not on my exam, withdraws to pain, no sedation  CV: s1s2 rrr, no m/r/g PULM: even/non-labored on vent, apneic on PS, few scattered rhonchi  PO:EUMP, non-tender, bsx4 active, PEG  Extremities: warm/dry, no edema, contracted BLE and BUE     Resolved Hospital Problem list     Assessment & Plan:    Seizure with concern for Status Epilepticus - no further seizures. EEG neg.  -known seizure disorder   P: Neuro signed off  Continue keppra, vimpat   Acute on Chronic  Hypoxic Respiratory Failure  - s/p chronic baseline trach Providencia in Sputum -baseline chronic trach   3/2 failed SBT r/t apnea - unclear [facility] at [facility] but sounds like ATC   P: Vent support - 8cc/kg  F/u CXR  F/u ABG Daily SBT  Avoid oversedation    Concern for Aspiration with Vomiting  KUB negative for illeus  P: Aspiration precautions  HOB elevated  trickle feeds pepcid Continue zosyn    NG tube with 150 cc's of coffee ground drainage noted 2/28 - resolved.   No further bleeding  H/h stable   Plan trickle feeds - advance as tol    E-Coli, Providencia & Enterococcus UTI -baseline frequent UTI's  - WBC down trending  P: Trend fever, WBC Continue zosyn  Follow cultures to  maturity   Hx dCHF, AF Currently NSR  P: ICU monitoring Continue metoprolol  Lasix 20mg  IV x 1 3/2  Nausea / Vomiting  Dysphagia  GERD  No ileus per KUB P: Advance TF if tol trickle feeds  PRN zofran   AKI  Hypokalemia   P: Trend BMP / urinary output Replace electrolytes as indicated Avoid nephrotoxic agents, ensure adequate renal perfusion  Hx TBI with Quadriplegia  P: Supportive care [facility] to [facility] at some point - will ask CM/SW to assist -unsure if from [facility] LT[facility] v SNF and unclear baseline vent needs   Best practice:  Diet: TF Pain/Anxiety/Delirium protocol (if indicated): n/a  VAP protocol (if indicated): ordered  DVT prophylaxis: lovenox  GI prophylaxis: pepcid IV  Glucose control: n/a  Mobility: bedrest  Code Status: Limited Code  Family Communication: no family at bedside 3/2 Disposition: ICU. Need to figure out when to go back to [facility]. LTAC consult requested    LABS    PULMONARY Recent Labs  Lab 11/07/18 1652  PHART 7.381  PCO2ART 45.3  PO2ART 235.0*  HCO3 26.3  TCO2 28  O2SAT 100.0    CBC Recent Labs  Lab 11/08/18 0613 11/09/18 0522 11/10/18 0506  HGB 9.3* 9.6* 9.9*  HCT 31.1* 30.6* 31.8*  WBC 12.1* 8.6 7.0  PLT 190 178 207    COAGULATION No results for input(s): INR in the last 168 hours.  CARDIAC  No results for input(s): TROPONINI in the last 168 hours. No results for input(s): PROBNP in the last 168 hours.   CHEMISTRY Recent Labs  Lab 11/06/18 0320 11/07/18 1652 11/07/18 2023 11/08/18 0613 11/09/18 0522 11/10/18 0000 11/10/18 0506  NA 145 150* 147* 148* 148* 143  --   K 4.5 3.5 3.6 3.6 3.5 2.8*  --   CL 109  --  114* 116* 118* 113*  --   CO2 29  --  25 25 20* 20*  --   GLUCOSE 107*  --  103* 87 79 74  --   BUN 40*  --  28* 24* 21* 14  --   CREATININE 1.20  --  0.98 0.95 0.90 0.90  --   CALCIUM 8.3*  --  8.0* 7.9* 7.9* 8.0*  --   MG  --   --   --   --  2.1  --  1.8  PHOS  --   --   --   --   --   --   3.2   Estimated Creatinine Clearance: 82.6 mL/min (by C-G formula based on SCr of 0.9 mg/dL).   LIVER Recent Labs  Lab 11/05/18 1338 11/07/18 2023  AST 24 30  ALT 19 20  ALKPHOS 59 51  BILITOT 0.6 0.3  PROT 7.1 6.3*  ALBUMIN 2.5* 2.4*     INFECTIOUS Recent Labs  Lab 11/05/18 1339 11/07/18 2027  LATICACIDVEN 1.4 1.8     ENDOCRINE CBG (last 3)  Recent Labs    11/07/18 1332  GLUCAP 120*         IMAGING x48h  - image(s) personally visualized  -   highlighted in bold Ct Head Wo Contrast  Result Date: 11/08/2018 CLINICAL DATA:  Altered level of consciousness. Seizure like activity with vomiting and respiratory distress. EXAM: CT HEAD WITHOUT CONTRAST TECHNIQUE: Contiguous axial images were obtained from the base of the skull through the vertex without intravenous contrast. COMPARISON:  01/04/2017 FINDINGS: Brain: A disconnected right temporo-occipital approach shunt catheter terminates in the right sylvian fissure, unchanged. Encephalomalacia is again seen anteriorly in the right greater than left frontal lobes and in the right temporoparietal region and may be related to remote trauma. There is chronic severe brainstem and cerebellar atrophy. Moderate lateral and third ventriculomegaly is unchanged and may reflect central predominant cerebral atrophy versus communicating hydrocephalus. There is no evidence of acute infarct, intracranial hemorrhage, mass, midline shift, or extra-axial fluid collection. Vascular: No hyperdense vessel. Skull: Prior right-sided craniotomy changes. Sinuses/Orbits: Moderate mucosal thickening and small volume bubbly fluid/secretions in the left maxillary sinus with milder changes in the right maxillary sinus. Large left and moderate right mastoid effusions. Left middle ear opacification. Cerumen or other material in the right external auditory canal. Dysconjugate gaze. Other: Nasoenteric tube partially visualized. IMPRESSION: 1. No evidence of acute  intracranial abnormality. 2. Stable chronic changes including ventriculomegaly, presumed posttraumatic encephalomalacia, and severe cerebellar and brainstem atrophy. 3. Moderate mucosal thickening and small volume fluid in the paranasal sinuses. Left larger than right mastoid effusions and left middle ear effusion. Electronically Signed   By: Sebastian Ache M.D.   On: 11/08/2018 14:14   Dg Chest Port 1 View  Result Date: 11/09/2018 CLINICAL DATA:  Post intubation. EXAM: PORTABLE CHEST 1 VIEW COMPARISON:  11/07/2018; 09/13/2018 FINDINGS: Grossly unchanged cardiac silhouette and mediastinal contours. Stable positioning of support apparatus. No pneumothorax. Pulmonary vasculature appears less distinct with suspected increase in small to moderate size layering bilateral effusions with associated worsening bibasilar heterogeneous/consolidative opacities, right greater than left. Cluster granuloma within the right mid lung are unchanged. No acute osseous abnormalities. IMPRESSION: 1.  Stable positioning of support apparatus.  No pneumothorax. 2. Suspected worsening pulmonary edema with interval increase in bilateral small to moderate sized bilateral effusions and associated worsening bibasilar opacities, atelectasis versus infiltrate. Electronically Signed   By: Simonne Come M.D.   On: 11/09/2018 07:24    Cc time   Dirk Dress, NP 11/10/2018  10:56 AM Pager: [phone] or [phone]

## 2018-11-10 NOTE — Progress Notes (Signed)
Nutrition Follow-up  DOCUMENTATION CODES:   Not applicable  INTERVENTION:   TF regimen as follows:  Initiate Vital AF 1.2 @ 40 ml/hr via NG tube and increase by 10 ml every 8 hours to goal rate of 65 ml/hr.  This provides 1560 mL, 1872 kcal, 117 g protein, and 1264 mL free water.   Suggest repleting potassium  NUTRITION DIAGNOSIS:   Inadequate oral intake related to inability to eat as evidenced by NPO status.  Ongoing  GOAL:   Patient will meet greater than or equal to 90% of their needs  Not meeting at this time  MONITOR:   TF tolerance, Labs, Skin  REASON FOR ASSESSMENT:   Ventilator Enteral/tube feeding initiation and management  ASSESSMENT:   58 yo male with PMH of DM, quadriplegia, chronic resp failure, trach, PEG, A fib, HF, seizures who was admitted from [facility] with sepsis related to UTI and HCAP.   Trach in place NG tube in place PEG  Labs: potassium low, chloride elevated, corrected Ca WNL Meds: Pepcid, ProStat daily, lopressor, IVF, miralax PRN  Pt awake, non-verbal. Seemed slightly aggitated at time of visit, indicated 'yes' when asked if he needed his nurse. TF hung, but not running. Will monitor for tolerance.  Diet Order:  No known food allergies Diet Order            Diet NPO time specified  Diet effective now             Vital AF 1.2 at 65 mL/hr (goal) provides 1560 mL, 1872 kcal, 117 g protein, and 1264 mL free water.   EDUCATION NEEDS:  No education needs have been identified at this time  Skin:  Skin Assessment: Skin Integrity Issues: Skin Integrity Issues:: Other (Comment) Other: left leg/ankle wounds; pink scar tissue from wounds that previously healed on sacrum, L hip, & R hip  Last BM:  3/1, type 5  Height:  Ht Readings from Last 1 Encounters:  11/06/18 5\' 7"  (1.702 m)    Weight:  Wt Readings from Last 10 Encounters:  11/08/18 69.8 kg  09/11/18 64.3 kg  04/15/17 58.7 kg  01/09/17 63.5 kg  Limited wt  hx Noted wt fluctuations, wt up x 2 months  Ideal Body Weight:  60.5 kg(-10% d/t quad)  BMI:  Body mass index is 24.1 kg/m.  Estimated Nutritional Needs: updated 3/2, calculated based on 2/29 wt (~70 kg)  Kcal:  1750-2100 (25-30 kcal/kg)  Protein:  105-126 gm (1.5-1.8 g/kg)  Fluid:  1 mL/kcal or per MD  Jolaine Artist, MS, RDN, LDN Pager: [phone] Available Mondays and Fridays, 9am-2pm

## 2018-11-10 NOTE — NC FL2 (Signed)
[place] LEVEL OF CARE SCREENING TOOL     IDENTIFICATION  Patient Name: Dean Graham Birthdate: 1961-08-07 Sex: male Admission Date (Current Location): 11/05/2018  [facility] and [place] Number:  Herbalist and Address:  [facility], [place] [address], [place], [place] [address]      Provider Number: [id number]  Attending Physician Name and Address:  Rigoberto Noel, MD  Relative Name and Phone Number:       Current Level of Care: Hospital Recommended Level of Care: [place] Prior Approval Number:    Date Approved/Denied:   PASRR Number:    Discharge Plan: SNF    Current Diagnoses: Patient Active Problem List   Diagnosis Date Noted  . Sepsis secondary to UTI ([place]) 11/05/2018  . Sepsis ([place]) 09/10/2018  . Hyperkalemia 09/10/2018  . Chronic diastolic heart failure ([place]) 04/09/2017  . GERD (gastroesophageal reflux disease) 04/09/2017  . Atrial fibrillation, currently in sinus rhythm 04/09/2017  . Tracheostomy in place [facility]) 04/09/2017  . Chronic respiratory failure with hypoxia ([place]) 04/09/2017  . 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 Diabetes mellitus type 2, diet-controlled ([place]) 04/09/2017  . Chronic hypotension 04/09/2017  . H/O quadriplegia 04/09/2017  . AKI (acute kidney injury) ([place]) 04/09/2017  . Acute hypoxemic respiratory failure ([place])   . Ventilator dependent ([place])   . 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 Fever   . Leukocytosis   . Aspiration pneumonia ([place]) 01/04/2017  . Anasarca   . Hemoptysis   . Tracheostomy care ([place])   . Hypoalbuminemia   . Cough with frothy sputum   . VAP (ventilator-associated pneumonia) ([place])   . Carbapenem-resistant Acinetobacter baumannii infection   . History of infection by MDR Stenotrophomonas maltophilia   . History of MDR Pseudomonas aeruginosa infection   . Chronic post traumatic encephalopathy    . Acute encephalopathy   . Hypokalemia   . Hypomagnesemia   . Urinary tract infection, site not specified   . Acute on chronic respiratory failure with hypoxia ([place])   . Dysphagia   . Decubitus ulcer of sacral area   . Encounter for wound care   . Protein-calorie malnutrition, severe ([place])   . Palliative care encounter   . Goals of care, counseling/discussion   . Pressure injury of skin 05/31/2016  . Quadriplegia ([place]) 05/31/2016  . Atrial fibrillation ([place]) 05/31/2016  . Diabetes mellitus without complication ([place]) 20/25/4270  . Respiratory failure ([place]) 05/31/2016  . 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 Septic shock ([place]) 05/29/2016  . HCAP (healthcare-associated pneumonia)     Orientation RESPIRATION BLADDER Height & Weight     (intubated, unable to assess)  Vent(40%) External catheter, Incontinent(placed 09/10/18) Weight: 153 lb 14.1 oz (69.8 kg)(Bed wt) Height:  5' 7" (170.2 cm)  BEHAVIORAL SYMPTOMS/MOOD NEUROLOGICAL BOWEL NUTRITION STATUS      Incontinent(G Tube) Diet, NG/panda  AMBULATORY STATUS COMMUNICATION OF NEEDS Skin   Extensive Assist   Skin abrasions, Other (Comment)(Open wound on left lower ankle, foam dressing, change PRN. Open wound on left leg, foam dressing.  Callus on left toe.  Open wound on sacrium. )                       Personal Care Assistance Level of Assistance  Bathing, Feeding, Dressing Bathing Assistance: Maximum assistance Feeding assistance: Maximum assistance Dressing Assistance: Maximum assistance     Functional Limitations Info  Sight, Hearing, Speech Sight Info: Adequate Hearing Info: Adequate Speech Info: (unable to speak, intubated)    [place]  PT (By licensed PT), OT (By licensed OT)                    Contractures Contractures Info: Not present    Additional Factors Info  Code Status, Allergies, Isolation Precautions Code Status Info: Partial Code Allergies Info: Ativan Lorazepam,  Azithromycin, Ceftriaxone, Vancomycin     Isolation Precautions Info: Contact Precaution; OMDRO, ESBL     Current Medications (11/10/2018):  This is the current hospital active medication list Current Facility-Administered Medications  Medication Dose Route Frequency Provider Last Rate Last Dose  . 0.9 %  sodium chloride infusion   Intravenous Continuous Brand Males, MD 10 mL/hr at 11/10/18 1400    . 0.9 %  sodium chloride infusion   Intravenous PRN Noe Gens L, NP 10 mL/hr at 11/10/18 1400    . acetaminophen (TYLENOL) tablet 650 mg  650 mg Per Tube Q6H PRN Dessa Phi, DO       Or  . acetaminophen (TYLENOL) suppository 650 mg  650 mg Rectal Q6H PRN Dessa Phi, DO   650 mg at 11/07/18 1743  . aspirin chewable tablet 81 mg  81 mg Per Tube Daily Dessa Phi, DO   81 mg at 11/10/18 4665  . chlorhexidine gluconate (MEDLINE KIT) (PERIDEX) 0.12 % solution 15 mL  15 mL Mouth Rinse BID Ollis, Brandi L, NP   15 mL at 11/10/18 0816  . diazepam (VALIUM) tablet 2 mg  2 mg Per Tube Daily Dessa Phi, DO   2 mg at 11/10/18 9935  . diazepam (VALIUM) tablet 5 mg  5 mg Oral QHS Dessa Phi, DO   5 mg at 11/09/18 2128  . enoxaparin (LOVENOX) injection 40 mg  40 mg Subcutaneous Q24H Dessa Phi, DO   40 mg at 11/09/18 2130  . famotidine (PEPCID) tablet 20 mg  20 mg Per Tube BID Dessa Phi, DO   20 mg at 11/10/18 7017  . feeding supplement (VITAL AF 1.2 CAL) liquid 1,000 mL  1,000 mL Per Tube Continuous Icard, Bradley L, DO 65 mL/hr at 11/10/18 1140 1,000 mL at 11/10/18 1140  . glycopyrrolate (ROBINUL) tablet 1 mg  1 mg Per Tube BID PRN Dessa Phi, DO      . lacosamide (VIMPAT) 100 mg in sodium chloride 0.9 % 25 mL IVPB  100 mg Intravenous Q12H Rogue Jury, MD   Stopped at 11/10/18 1005  . levETIRAcetam (KEPPRA) IVPB 1500 mg/ 100 mL premix  1,500 mg Intravenous Q12H Irene Pap N, DO   Stopped at 11/10/18 0830  . loperamide HCl (IMODIUM) 1 MG/7.5ML suspension 2 mg  2 mg  Per Tube TID PRN Dessa Phi, DO      . MEDLINE mouth rinse  15 mL Mouth Rinse 10 times per day Noe Gens L, NP   15 mL at 11/10/18 1357  . metoprolol tartrate (LOPRESSOR) tablet 25 mg  25 mg Per Tube Q12H Dessa Phi, DO   Stopped at 11/08/18 1034  . ondansetron (ZOFRAN) tablet 4 mg  4 mg Per Tube Q6H PRN Dessa Phi, DO       Or  . ondansetron (ZOFRAN) injection 4 mg  4 mg Intravenous Q6H PRN Dessa Phi, DO   4 mg at 11/07/18 1450  . piperacillin-tazobactam (ZOSYN) IVPB 3.375 g  3.375 g Intravenous Q8H Hall, [place] N, DO   Stopped at 11/10/18  1336  . polyethylene glycol (MIRALAX / GLYCOLAX) packet 17 g  17 g Per Tube Daily PRN Dessa Phi, DO      . potassium chloride 20 MEQ/15ML (10%) solution 40 mEq  40 mEq Per Tube TID Marijean Heath, NP   40 mEq at 11/10/18 1138  . saccharomyces boulardii (FLORASTOR) capsule 250 mg  250 mg Per Tube BID Dessa Phi, DO   250 mg at 11/10/18 8676  . sodium chloride flush (NS) 0.9 % injection 10-40 mL  10-40 mL Intracatheter Q12H Dessa Phi, DO   10 mL at 11/10/18 7209  . sodium chloride flush (NS) 0.9 % injection 10-40 mL  10-40 mL Intracatheter PRN Dessa Phi, DO   10 mL at 11/07/18 4709     Discharge Medications: Please see discharge summary for a list of discharge medications.  Relevant Imaging Results:  Relevant Lab Results:   Additional Information SS#: [id number]  Eileen Stanford, LCSW

## 2018-11-11 ENCOUNTER — Inpatient Hospital Stay (HOSPITAL_COMMUNITY): Payer: Medicare Other

## 2018-11-11 DIAGNOSIS — Z930 Tracheostomy status: Secondary | ICD-10-CM

## 2018-11-11 LAB — CBC WITH DIFFERENTIAL/PLATELET
Abs Immature Granulocytes: 0.02 10*3/uL (ref 0.00–0.07)
BASOS PCT: 0 %
Basophils Absolute: 0 10*3/uL (ref 0.0–0.1)
Eosinophils Absolute: 0.2 10*3/uL (ref 0.0–0.5)
Eosinophils Relative: 3 %
HCT: 36 % — ABNORMAL LOW (ref 39.0–52.0)
Hemoglobin: 11 g/dL — ABNORMAL LOW (ref 13.0–17.0)
Immature Granulocytes: 0 %
Lymphocytes Relative: 22 %
Lymphs Abs: 1.6 10*3/uL (ref 0.7–4.0)
MCH: 29.7 pg (ref 26.0–34.0)
MCHC: 30.6 g/dL (ref 30.0–36.0)
MCV: 97.3 fL (ref 80.0–100.0)
Monocytes Absolute: 0.6 10*3/uL (ref 0.1–1.0)
Monocytes Relative: 8 %
NEUTROS ABS: 4.8 10*3/uL (ref 1.7–7.7)
Neutrophils Relative %: 67 %
Platelets: 238 10*3/uL (ref 150–400)
RBC: 3.7 MIL/uL — ABNORMAL LOW (ref 4.22–5.81)
RDW: 13.7 % (ref 11.5–15.5)
WBC: 7.3 10*3/uL (ref 4.0–10.5)
nRBC: 0 % (ref 0.0–0.2)

## 2018-11-11 LAB — BASIC METABOLIC PANEL
Anion gap: 4 — ABNORMAL LOW (ref 5–15)
BUN: 13 mg/dL (ref 6–20)
CO2: 25 mmol/L (ref 22–32)
Calcium: 8.5 mg/dL — ABNORMAL LOW (ref 8.9–10.3)
Chloride: 117 mmol/L — ABNORMAL HIGH (ref 98–111)
Creatinine, Ser: 0.85 mg/dL (ref 0.61–1.24)
GFR calc Af Amer: >60 mL/min (ref >60)
GFR calc non Af Amer: >60 mL/min (ref >60)
Glucose, Bld: 117 mg/dL — ABNORMAL HIGH (ref 70–99)
Potassium: 3.9 mmol/L (ref 3.5–5.1)
Sodium: 146 mmol/L — ABNORMAL HIGH (ref 135–145)

## 2018-11-11 LAB — PHOSPHORUS: Phosphorus: 2.5 mg/dL (ref 2.5–4.6)

## 2018-11-11 LAB — MAGNESIUM: Magnesium: 2.1 mg/dL (ref 1.7–2.4)

## 2018-11-11 MED ORDER — SODIUM CHLORIDE 0.9 % IV SOLN: 2.0000 g | INTRAVENOUS | Status: AC

## 2018-11-11 MED ORDER — SODIUM CHLORIDE 0.9 % IV SOLN: 100.0000 mg | Freq: Two times a day (BID) | INTRAVENOUS | Status: AC

## 2018-11-11 MED ORDER — VITAL AF 1.2 CAL PO LIQD: 1000.0000 mL | ORAL | Status: AC

## 2018-11-11 MED ORDER — LEVETIRACETAM IN NACL 1500 MG/100ML IV SOLN: 1500.0000 mg | Freq: Two times a day (BID) | INTRAVENOUS | Status: AC

## 2018-11-11 MED ORDER — ENOXAPARIN SODIUM 40 MG/0.4ML ~~LOC~~ SOLN: 40.0000 mg | SUBCUTANEOUS | Status: AC

## 2018-11-11 MED ORDER — SODIUM CHLORIDE 0.9 % IV SOLN
2.0000 g | INTRAVENOUS | Status: DC
  Administered 2018-11-11: 2 g via INTRAVENOUS
  Filled 2018-11-11: qty 20

## 2018-11-11 MED ORDER — FUROSEMIDE 40 MG PO TABS: ORAL_TABLET | ORAL | Status: AC

## 2018-11-11 NOTE — Discharge Summary (Addendum)
Physician Discharge Summary         Patient ID: Zalman Ganser MRN: [id number] DOB/AGE: 60/05/1961 60 y.o.  Admit date: 11/05/2018 Discharge date: 11/11/2018  Discharge Diagnoses:    History of seizure disorder Possible seizure Acute on chronic hypoxic respiratory failure Ventilator associated pneumonia, Providencia in sputum  Polymicrobial urinary tract infection: Provodencia, ecoli & enterococcus  Stage III decubitus ulcer on left thigh Diastolic heart failure Atrial fibrillation Dysphasia Gastroesophageal reflux disease Acute kidney injury Fluid and electrolyte imbalance: Hypokalemia History of traumatic brain injury  Discharge summary    This is a 58 year old male patient who was admitted from [facility] with concern for aspiration pneumonia after vomiting episode.  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  At baseline he communicates by nodding head.  In the ER was found to be febrile, chest x-ray showed bibasilar airspace disease he was admitted with a working diagnosis of healthcare associated pneumonia and possible urinary tract infection.  Therapeutic interventions included: Sputum culture, urine culture, IV hydration, empiric antibiotics, initially started on IV Zosyn.  Culture data returned, demonstrating the following in his urine:Provodencia, ecoli & enterococcus and Providencia in sputum and blood ; he was changed to ceftriaxone on 3/3 with plan to complete 7 more days  .  At time of discharge he is hemodynamically stable.  Active issues are mentioned below.  He is still ventilator dependent, and we are working on Scientist, product/process development ventilation.  He is now ready to transfer back to the LTAC setting where he can continue to work on weaning from the mechanical ventilator with goal to get the trach collar, complete 7 more days of IV antibiotics via PICC line and resume previously outlined  care per [facility].    Discharge Plan by Active Problems    Acute on chronic respiratory failure in the setting of aspiration versus healthcare associated pneumonia; with chronic tracheostomy dependence Plan Completing 7 more days of ceftriaxone Continue weaning protocol per [facility] Limit sedation For ventilator associated pneumonia prevention interventions  Dysphasia Plan Continue tube feeds via PEG  Polymicrobial urinary tract infection with Providencia bacteremia Plan Completing 7 more days of ceftriaxone  History of seizure disorder Plan Continue current anti-convulsants  History of TBI with quadriplegia Plan Needs assistance with all ADLs  History of diastolic heart failure and atrial fibrillation Plan Continuing home meds see discharge list  Intermittent fluid and electrolyte imbalance Plan Intermittent labs      Significant Hospital tests/ studies  EEG 11/07/2018>>This is an abnormal electroencephalogram secondary to a breach rhythm and left frontotemporal sharp transients which are consistent with the patient's history.  No evidence of electrographic seizure activity is noted   2/28 KUB Nonobstructive bowel gas pattern. Enteric tube tip projects over gastric body. 3/2 EEG>>> neg seizure   Procedures    Culture data/antimicrobials   BCID 2/26 > coag neg staph 1/2, providencia stuartii >>   BCx2 2/26 >>  BCx2 2/28 >>  UC 2/26 >> greater 100k E-Coli >> ESBL>>>                     greater 100k providencia stuartii >>                      60k enterococcus >>  Sputum 2/29>>> normal flora   Zosyn 2/26 >> 3/3 Rocephin 3/3>>>(goal 7 days)    Consults    Discharge Exam: Blood Pressure (  Abnormal) 141/106   Pulse 96   Temperature 98.5 F (36.9 C) (Axillary)   Respiration (Abnormal) 24   Height  (1.702 m)   Weight 69.8 kg Comment: Bed wt  Oxygen Saturation 99%   Body Mass Index 24.10 kg/m   General: Contracted male is  poorly responsive HEENT: Tracheostomy in place Neuro: Contracted lower extremities.  Noted to have healing decubitus on left hip old decubitus that is healed on right hip left great toe with some skin breakdown CV: Heart sounds are regular /r/g PULM: even/non-labored, lungs bilaterally coarse GI: Positive bowel sounds PEG is in place Extremities: Contracted Skin: Left hip healing ulcer left great toe skin breakdown       Labs at discharge   Lab Results  Component Value Date   CREATININE 0.85 11/11/2018   BUN 13 11/11/2018   NA 146 (H) 11/11/2018   K 3.9 11/11/2018   CL 117 (H) 11/11/2018   CO2 25 11/11/2018   Lab Results  Component Value Date   WBC 7.3 11/11/2018   HGB 11.0 (L) 11/11/2018   HCT 36.0 (L) 11/11/2018   MCV 97.3 11/11/2018   PLT 238 11/11/2018   Lab Results  Component Value Date   ALT 20 11/07/2018   AST 30 11/07/2018   ALKPHOS 51 11/07/2018   BILITOT 0.3 11/07/2018   Lab Results  Component Value Date   INR 1.06 08/05/2017   INR 1.29 01/10/2017   INR 1.24 01/09/2017    Current radiological studies    Dg Chest Portable 1 View  Result Date: 11/11/2018 CLINICAL DATA:  ETT,history significant of traumatic brain injury with quadriplegia, tracheostomy , CHF, chronic indwelling Foley catheter, frequent UTIs, aspiration pneumonia, and seizure disorder; fever EXAM: PORTABLE CHEST 1 VIEW COMPARISON:  11/09/2018 FINDINGS: There is persistent lung base opacities consistent with a combination of pleural effusions and atelectasis and/or pneumonia. No convincing pulmonary edema. Dense material is noted in the right mid to lower lung zone, stable. No new lung abnormalities. No pneumothorax. Tracheostomy tube, nasal/orogastric tube and right PICC are stable and well positioned. IMPRESSION: 1. No significant change from the most recent prior exam. 2. Bilateral pleural effusions with associated atelectasis and/or pneumonia. No convincing pulmonary edema. 3. Support  apparatus is well positioned. Electronically Signed   By: Amie Portland M.D.   On: 11/11/2018 09:12    Disposition:    Discharge disposition: 02-Transferred to [facility]      back to [facility].  Discharge Instructions    Diet: resume tubefeeds Complete by:  As directed    Increase activity slowly  Ventilator weaning protocol per [facility] Complete by:  As directed       Allergies as of 11/11/2018    Allergen Reactions Comment   Vancomycin Anaphylaxis Blisters all over body   Ativan [lorazepam] Other (See Comments) unknown   Azithromycin Other (See Comments) Blisters all over body   Ceftriaxone Other (See Comments) Blisters all over body      Medication List    Stop taking these medications   diazepam 2 MG tablet Commonly known as:  VALIUM   diazepam 5 MG tablet Commonly known as:  VALIUM   levETIRAcetam 100 MG/ML solution Commonly known as:  KEPPRA   traMADol 50 MG tablet Commonly known as:  ULTRAM     Take these medications   acetaminophen 325 MG tablet Commonly known as:  TYLENOL Place 2 tablets (650 mg total) into feeding tube every 6 (six) hours as needed for  mild pain (or Fever >/= 101).   aspirin 81 MG chewable tablet Place 81 mg into feeding tube daily.   cefTRIAXone 2 g in sodium chloride 0.9 % 100 mL Inject 2 g into the vein daily.   chlorhexidine 0.12 % solution Commonly known as:  PERIDEX Use as directed 15 mLs in the mouth or throat 2 (two) times daily. What changed:    how much to take  additional instructions   enoxaparin 40 MG/0.4ML injection Commonly known as:  LOVENOX Inject 0.4 mLs (40 mg total) into the skin daily.   famotidine 20 MG tablet Commonly known as:  PEPCID Place 20 mg into feeding tube 2 (two) times daily.   feeding supplement (VITAL AF 1.2 CAL) Liqd Place 1,000 mLs into feeding tube continuous.   furosemide 40 MG tablet Commonly known as:  LASIX Holding this at time of discharge, was on preadmission  medication list, will need to be reassessed. What changed:    how much to take  how to take this  when to take this  additional instructions   glycopyrrolate 1 MG tablet Commonly known as:  ROBINUL Place 1 tablet (1 mg total) into feeding tube 2 (two) times daily as needed (EXCESSIVE SECRETION). What changed:  when to take this   lacosamide 100 mg in sodium chloride 0.9 % 25 mL Inject 100 mg into the vein every 12 (twelve) hours.   levETIRacetam 1500 MG/100ML Soln Commonly known as:  KEPPRA Inject 100 mLs (1,500 mg total) into the vein every 12 (twelve) hours.   Loperamide HCl 1 MG/7.5ML Liqd Take 2 mg by mouth 3 (three) times daily as needed (diarrhea).   metoprolol tartrate 25 MG tablet Commonly known as:  LOPRESSOR Place 25 mg into feeding tube every 12 (twelve) hours.   multivitamin with minerals Tabs tablet Place 1 tablet into feeding tube daily.   ondansetron 4 MG tablet Commonly known as:  ZOFRAN Place 1 tablet (4 mg total) into feeding tube every 6 (six) hours as needed for nausea.   saccharomyces boulardii 250 MG capsule Commonly known as:  FLORASTOR Place 1 capsule (250 mg total) into feeding tube 2 (two) times daily.        Follow-up appointment   Not indicated Discharge Condition:    fair  Physician Statement:   The Patient was personally examined, the discharge assessment and plan has been personally reviewed and I agree with ACNP Gabriele Zwilling's assessment and plan. 32 minutes of time have been dedicated to discharge assessment, planning and discharge instructions.   Signed: Shelby Mattocks 11/11/2018, 3:43 PM

## 2018-11-11 NOTE — Progress Notes (Signed)
Report called and given to [facility] team and RN at [facility].  Pt discharge to [facility] via [facility].  VSS at time of discharge.

## 2018-11-11 NOTE — Progress Notes (Signed)
NAME:  Dean Graham, MRN:  [id number], DOB:  1960-11-07, LOS: 6 ADMISSION DATE:  11/05/2018, CONSULTATION DATE:  2/28 REFERRING MD:  Dr. Margo Aye, CHIEF COMPLAINT:  Respiratory Distress    Brief History    58 y/o M, [facility] Resident, with TBI / quadriplegia chronic trach admitted to [facility] on 2/26 with concerns for vomiting and aspiration.   Initial work up included a CXR with chronic bibasilar atelectasis, Na 145, K 5, cl 111, CO2 26, glucose 148, BUN 49, sr cr 1.31, BNP 115, lactic acid 1.4, WBC 9.4, Hgb 11, MCV 101 and platelets 192.  Cultures obtained show providencia in the blood.  UC growing providencia, e-coli & enterococcus.  On 2/28 he suffered what appeared to be a seizure & subsequent vomiting with respiratory distress, pt transferred to ICU.Concern of aspiration   Past Medical History  Quadriplegia  GERD Dysphagia  dCHF  DM  Chronic Respiratory Failure  CHF  AF  Seizures   Significant Hospital Events   2/26 Admit from [facility]  2/28 Concern for vomiting / aspiration, tx to ICU  2/29 - Failed SBT 2/29 am T Max 102.5 3/2>> failed SBT - apneic   Consults:  PCCM   Procedures:  Chronic tracheostomy from [facility]  Significant Diagnostic Tests:  EEG 11/07/2018>>This is an abnormal electroencephalogram secondary to a breach rhythm and left frontotemporal sharp transients which are consistent with the patient's history.   No evidence of electrographic seizure activity is noted   2/28 KUB Nonobstructive bowel gas pattern. Enteric tube tip projects over gastric body. 3/2 EEG>>> neg seizure   Micro Data:  BCID 2/26 > coag neg staph 1/2, providencia stuartii >>   BCx2 2/26 >>  BCx2 2/28 >>  UC 2/26 >> greater 100k E-Coli >> ESBL>>>          greater 100k providencia stuartii >>           60k enterococcus >>  Sputum 2/29>>> normal flora   Antimicrobials:  Zosyn 2/26 >>   Interim history/subjective:  No sig change overnight  Failed sbt r/t apnea    Objective   Blood  pressure (!) 134/95, pulse 85, temperature 98.1 F (36.7 C), temperature source Oral, resp. rate (!) 23, height 5\' 7"  (1.702 m), weight 69.8 kg, SpO2 100 %.    Vent Mode: SIMV;PSV FiO2 (%):  [40 %] 40 % Set Rate:  [10 bmp-16 bmp] 10 bmp Vt Set:  [520 mL] 520 mL PEEP:  [5 cmH20] 5 cmH20 Pressure Support:  [15 cmH20] 15 cmH20 Plateau Pressure:  [16 cmH20-23 cmH20] 23 cmH20   Intake/Output Summary (Last 24 hours) at 11/11/2018 6063 Last data filed at 11/11/2018 0800 Gross per 24 hour  Intake 1111.91 ml  Output 1700 ml  Net -588.09 ml   Filed Weights   11/08/18 1256  Weight: 69.8 kg     General: Contracted male is poorly responsive HEENT: Tracheostomy in place Neuro: Contracted lower extremities.  Noted to have healing decubitus on left hip old decubitus that is healed on right hip left great toe with some skin breakdown CV: Heart sounds are regular /r/g PULM: even/non-labored, lungs bilaterally coarse GI: Positive bowel sounds PEG is in place Extremities: Contracted Skin: Left hip healing ulcer left great toe skin breakdown           Resolved Hospital Problem list     Assessment & Plan:    Seizure with concern for Status Epilepticus - no further seizures. EEG neg.  -known seizure disorder  P: Neurology signed off Continue antiseizure medications  Acute on Chronic Hypoxic Respiratory Failure  - s/p chronic baseline trach Providencia in Sputum Baseline chronic trach  3/2 failed SBT r/t apnea - unclear baseline at [facility] but sounds like ATC   P: Try SIMV Avoid oversedation Wean per protocol If unable to liberate from ventilator will need LTAC   Concern for Aspiration with Vomiting  KUB negative for illeus  P: Aspiration precautions Currently on Zosyn   NG tube with 150 cc's of coffee ground drainage noted 2/28 - resolved.   Monitor for any bleeding  Plan Change tube feedings per PEG   E-Coli, Providencia & Enterococcus UTI Frequent  UTIs Continue to monitor  P: Monitor fever and white count Continue Zosyn day 4 Monitor culture data  Hx dCHF, AF Currently NSR  P: Continue ICU monitoring  Nausea / Vomiting  Dysphagia  GERD  No ileus per KUB P:  Tube feedings per PEG  AKI  Hypokalemia  Recent Labs  Lab 11/09/18 0522 11/10/18 0000 11/11/18 0427  K 3.5 2.8* 3.9   Lab Results  Component Value Date   CREATININE 0.85 11/11/2018   CREATININE 0.90 11/10/2018   CREATININE 0.90 11/09/2018     P: Trend trend electrolytes Avoid nephrotoxic agents  Hx TBI with Quadriplegia  P: Continue supportive care Evaluate for change to long-term care facility   Best practice:  Diet: TF Pain/Anxiety/Delirium protocol (if indicated): n/a  VAP protocol (if indicated): ordered  DVT prophylaxis: lovenox  GI prophylaxis: pepcid IV  Glucose control: n/a  Mobility: bedrest  Code Status: Limited Code  Family Communication: 11/11/2018 no family at bedside Disposition: ICU.  We will need to see what criteria he needs to meet to return to [facility].  A long-term care facility consult has been ordered.   LABS    PULMONARY Recent Labs  Lab 11/07/18 1652  PHART 7.381  PCO2ART 45.3  PO2ART 235.0*  HCO3 26.3  TCO2 28  O2SAT 100.0    CBC Recent Labs  Lab 11/09/18 0522 11/10/18 0506 11/11/18 0427  HGB 9.6* 9.9* 11.0*  HCT 30.6* 31.8* 36.0*  WBC 8.6 7.0 7.3  PLT 178 207 238    COAGULATION No results for input(s): INR in the last 168 hours.  CARDIAC  No results for input(s): TROPONINI in the last 168 hours. No results for input(s): PROBNP in the last 168 hours.   CHEMISTRY Recent Labs  Lab 11/07/18 2023 11/08/18 3818 11/09/18 0522 11/10/18 0000 11/10/18 0506 11/11/18 0427  NA 147* 148* 148* 143  --  146*  K 3.6 3.6 3.5 2.8*  --  3.9  CL 114* 116* 118* 113*  --  117*  CO2 25 25 20* 20*  --  25  GLUCOSE 103* 87 79 74  --  117*  BUN 28* 24* 21* 14  --  13  CREATININE 0.98 0.95 0.90  0.90  --  0.85  CALCIUM 8.0* 7.9* 7.9* 8.0*  --  8.5*  MG  --   --  2.1  --  1.8 2.1  PHOS  --   --   --   --  3.2 2.5   Estimated Creatinine Clearance: 87.5 mL/min (by C-G formula based on SCr of 0.85 mg/dL).   LIVER Recent Labs  Lab 11/05/18 1338 11/07/18 2023  AST 24 30  ALT 19 20  ALKPHOS 59 51  BILITOT 0.6 0.3  PROT 7.1 6.3*  ALBUMIN 2.5* 2.4*     INFECTIOUS Recent Labs  Lab 11/05/18 1339 11/07/18 2027  LATICACIDVEN 1.4 1.8     ENDOCRINE CBG (last 3)  No results for input(s): GLUCAP in the last 72 hours.       IMAGING x48h  - image(s) personally visualized  -   highlighted in bold No results found.  App CCT 30 mins  Brett Canales Genese Quebedeaux ACNP Adolph Pollack PCCM Pager [phone] till 1 pm If no answer page [phone] 11/11/2018, 8:21 AM

## 2018-11-11 NOTE — Care Management Note (Addendum)
Case Management Note  Patient Details  Name: [name] MRN: [id number] Date of Birth: 1960-10-16  Subjective/Objective:  Dean Graham is a 58 y.o. male with medical history significant of traumatic brain injury with quadriplegia, tracheostomy/PEG dependent, diastolic CHF, chronic indwelling Foley catheter, frequent UTIs, aspiration pneumonia, and seizure disorder; who presents for fever. PTA, pt resided at [facility].                  Action/Plan: Pt's mother and brother are legal guardians for pt.  CSW following to facilitate return to SNF upon medical stability.  Expected Discharge Date:                  Expected Discharge Plan:  Skilled Nursing Facility  In-House Referral:  Clinical Social Work  Discharge planning Services  CM Consult  Post Acute Care Choice:  Long Term Acute Care (LTAC) Choice offered to:     DME Arranged:    DME Agency:     HH Arranged:    HH Agency:     Status of Service:  Completed, signed off  If discussed at [employer], dates discussed:    Additional Comments:  11/11/18 J. Javari Bufkin, RN, BSN Pt still requiring vent weaning; MD states pt stable to return to [facility] upon bed availability.  Pt to return to LTAC side of [facility] facility for vent wean, then return to SNF when weaned appropriately.  Spoke with pt's mother and brother, who are in agreement with this plan.  Updated MD and bedside nurse.  Bedside nurse to call report to accepting facility and call [facility] for transport when pt ready for transport.    Glennon Mac, RN 11/11/2018, 3:17 PM

## 2018-11-11 NOTE — Progress Notes (Signed)
[facility] patient with traumatic brain injury/quadriplegia and chronic tracheostomy admitted 2/26 with vomiting and aspiration requiring mechanical ventilation, Providencia in blood & urine , urine culture showing multiple organisms including ESBL E. Coli He had witnessed seizures, now on Keppra and Vimpat, last EEG 3/1 did not show any further seizure activity.  On 3/2 he was not triggering the vent and hence changed to SIMV/pressure support settings On exam-awake following commands, quadriplegic, multiple old decubiti largest stage III on his left thigh  Labs show mild hyponatremia, no leukocytosis or anemia.  Chest x-ray personally reviewed. Impression/plan  Acute respiratory failure-decrease respiratory rate to 8 on SIMV, try weaning on pressure support alone with goal to trach collar Diuresed well with Lasix yesterday   Gram-negative bacteremia from UTI-he has completed 7 days of Zosyn, can now change to ceftriaxone and complete another 7 days via PICC line  Traumatic brain injury/seizure disorder-continue Keppra and Vimpat per neurology He can go back to [facility] when bed is available  The patient is critically ill with multiple organ systems failure and requires high complexity decision making for assessment and support, frequent evaluation and titration of therapies, application of advanced monitoring technologies and extensive interpretation of multiple databases. Critical Care Time devoted to patient care services described in this note independent of APP/resident  time is 31 minutes.   Comer Locket Vassie Loll MD

## 2018-11-11 NOTE — Progress Notes (Addendum)
Discussed the patient's allergy history with Dr. Vassie Loll. Has allergy to CTX (blisters), Vancomycin and Azithro. He has tolerated Zosyn this admission, and carbapenems in the past. We will challenge him with CTX for his Providencia bacteremia.  Wendelyn Breslow, PharmD PGY1 Pharmacy Resident Phone: [phone] 11/11/2018 2:49 PM

## 2018-11-12 LAB — CULTURE, BLOOD (ROUTINE X 2)
Culture: NO GROWTH
Culture: NO GROWTH

## 2018-11-14 DIAGNOSIS — J189 Pneumonia, unspecified organism: Secondary | ICD-10-CM

## 2018-11-14 DIAGNOSIS — I5032 Chronic diastolic (congestive) heart failure: Secondary | ICD-10-CM

## 2018-11-14 DIAGNOSIS — J9621 Acute and chronic respiratory failure with hypoxia: Secondary | ICD-10-CM

## 2018-11-14 DIAGNOSIS — I482 Chronic atrial fibrillation, unspecified: Secondary | ICD-10-CM

## 2018-11-15 DIAGNOSIS — I482 Chronic atrial fibrillation, unspecified: Secondary | ICD-10-CM

## 2018-11-15 DIAGNOSIS — J9621 Acute and chronic respiratory failure with hypoxia: Secondary | ICD-10-CM

## 2018-11-15 DIAGNOSIS — J189 Pneumonia, unspecified organism: Secondary | ICD-10-CM

## 2018-11-15 DIAGNOSIS — I5032 Chronic diastolic (congestive) heart failure: Secondary | ICD-10-CM

## 2018-11-16 DIAGNOSIS — I482 Chronic atrial fibrillation, unspecified: Secondary | ICD-10-CM

## 2018-11-16 DIAGNOSIS — J9621 Acute and chronic respiratory failure with hypoxia: Secondary | ICD-10-CM

## 2018-11-16 DIAGNOSIS — J189 Pneumonia, unspecified organism: Secondary | ICD-10-CM

## 2018-11-16 DIAGNOSIS — I5032 Chronic diastolic (congestive) heart failure: Secondary | ICD-10-CM

## 2018-11-17 DIAGNOSIS — J189 Pneumonia, unspecified organism: Secondary | ICD-10-CM

## 2018-11-17 DIAGNOSIS — I5032 Chronic diastolic (congestive) heart failure: Secondary | ICD-10-CM

## 2018-11-17 DIAGNOSIS — I482 Chronic atrial fibrillation, unspecified: Secondary | ICD-10-CM

## 2018-11-17 DIAGNOSIS — J9621 Acute and chronic respiratory failure with hypoxia: Secondary | ICD-10-CM

## 2018-11-18 DIAGNOSIS — I482 Chronic atrial fibrillation, unspecified: Secondary | ICD-10-CM

## 2018-11-18 DIAGNOSIS — I5032 Chronic diastolic (congestive) heart failure: Secondary | ICD-10-CM

## 2018-11-18 DIAGNOSIS — J189 Pneumonia, unspecified organism: Secondary | ICD-10-CM

## 2018-11-18 DIAGNOSIS — J9621 Acute and chronic respiratory failure with hypoxia: Secondary | ICD-10-CM

## 2018-11-19 DIAGNOSIS — I482 Chronic atrial fibrillation, unspecified: Secondary | ICD-10-CM

## 2018-11-19 DIAGNOSIS — J189 Pneumonia, unspecified organism: Secondary | ICD-10-CM

## 2018-11-19 DIAGNOSIS — I5032 Chronic diastolic (congestive) heart failure: Secondary | ICD-10-CM

## 2018-11-19 DIAGNOSIS — J9621 Acute and chronic respiratory failure with hypoxia: Secondary | ICD-10-CM

## 2018-11-20 DIAGNOSIS — I5032 Chronic diastolic (congestive) heart failure: Secondary | ICD-10-CM

## 2018-11-20 DIAGNOSIS — J189 Pneumonia, unspecified organism: Secondary | ICD-10-CM

## 2018-11-20 DIAGNOSIS — J9621 Acute and chronic respiratory failure with hypoxia: Secondary | ICD-10-CM

## 2018-11-20 DIAGNOSIS — I482 Chronic atrial fibrillation, unspecified: Secondary | ICD-10-CM

## 2018-12-01 DIAGNOSIS — J189 Pneumonia, unspecified organism: Secondary | ICD-10-CM

## 2018-12-01 DIAGNOSIS — I482 Chronic atrial fibrillation, unspecified: Secondary | ICD-10-CM

## 2018-12-01 DIAGNOSIS — I5032 Chronic diastolic (congestive) heart failure: Secondary | ICD-10-CM

## 2018-12-01 DIAGNOSIS — J9621 Acute and chronic respiratory failure with hypoxia: Secondary | ICD-10-CM

## 2019-01-09 DEATH — deceased

## 2020-07-21 IMAGING — DX DG CHEST 1V PORT
1 series · 1 of 1 positions shown · non-contrast
Comparison: 09/13/2018

CLINICAL DATA: Fever.

EXAM:
PORTABLE CHEST 1 VIEW

[chest]
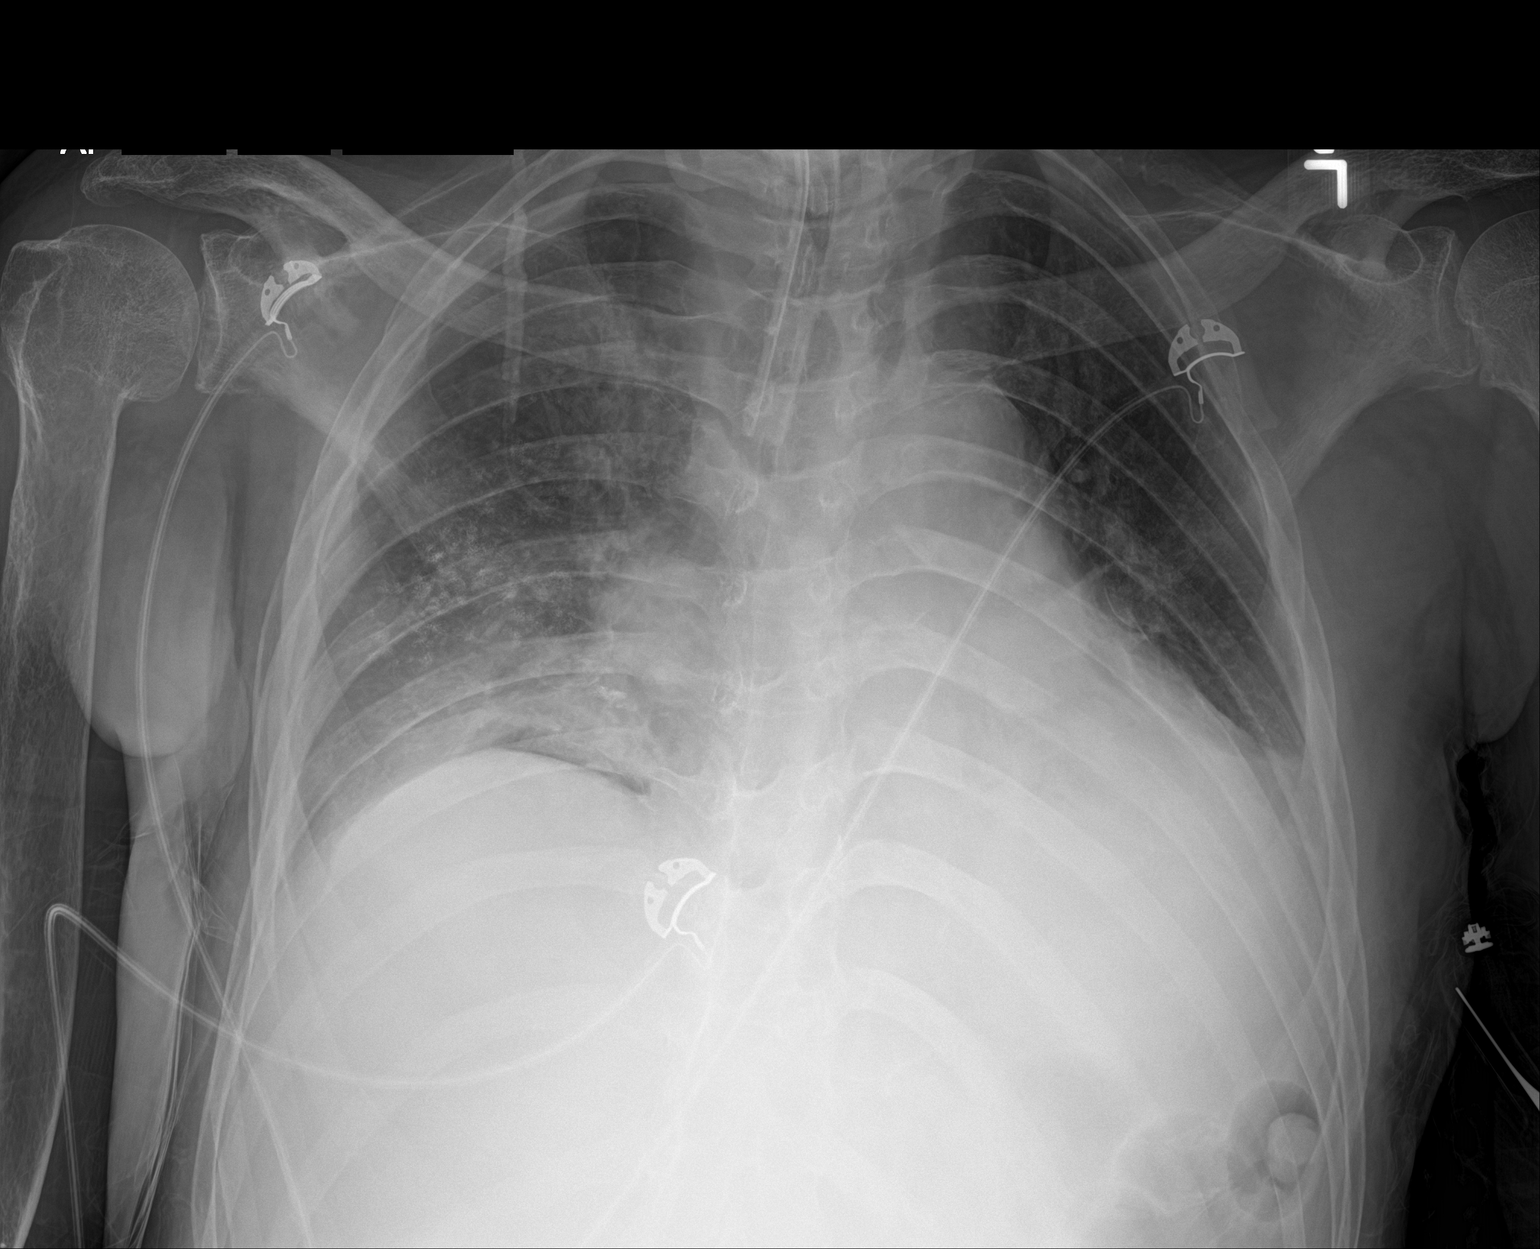

[1 of 1 positions shown; findings below may reference images not displayed]

FINDINGS: The tracheostomy tube is in good position with the tip 2.3 cm above
the carina.

The heart is mildly enlarged but stable. Stable tortuosity and
calcification of the thoracic aorta.

Bibasilar lung opacity appears slightly more pronounced,
particularly on the right side and could not exclude overlying
infiltrates. Chronic airspace calcifications noted in the right
lung. No definite pleural effusions.
IMPRESSION: 1. Tracheostomy tube in good position.
2. Bibasilar airspace opacity, most of which is probably chronic but
could not exclude superimposed infiltrates.
